# Patient Record
Sex: Female | Born: 1945 | Race: Black or African American | Hispanic: No | Smoking: Never smoker
Health system: Southern US, Community
[De-identification: ages and names within clinical notes are randomized; demographics above are authoritative.]

## PROBLEM LIST (undated history)

## (undated) ENCOUNTER — Emergency Department (HOSPITAL_COMMUNITY): Admission: EM | Payer: Medicare Other | Source: Home / Self Care

## (undated) DIAGNOSIS — K760 Fatty (change of) liver, not elsewhere classified: Secondary | ICD-10-CM

## (undated) DIAGNOSIS — N390 Urinary tract infection, site not specified: Secondary | ICD-10-CM

## (undated) DIAGNOSIS — D702 Other drug-induced agranulocytosis: Secondary | ICD-10-CM

## (undated) DIAGNOSIS — T451X5A Adverse effect of antineoplastic and immunosuppressive drugs, initial encounter: Secondary | ICD-10-CM

## (undated) DIAGNOSIS — D630 Anemia in neoplastic disease: Secondary | ICD-10-CM

## (undated) DIAGNOSIS — L989 Disorder of the skin and subcutaneous tissue, unspecified: Secondary | ICD-10-CM

## (undated) DIAGNOSIS — T783XXA Angioneurotic edema, initial encounter: Secondary | ICD-10-CM

## (undated) DIAGNOSIS — L439 Lichen planus, unspecified: Secondary | ICD-10-CM

## (undated) DIAGNOSIS — R509 Fever, unspecified: Secondary | ICD-10-CM

## (undated) DIAGNOSIS — M549 Dorsalgia, unspecified: Secondary | ICD-10-CM

## (undated) DIAGNOSIS — D61810 Antineoplastic chemotherapy induced pancytopenia: Secondary | ICD-10-CM

## (undated) DIAGNOSIS — R252 Cramp and spasm: Secondary | ICD-10-CM

## (undated) DIAGNOSIS — D72819 Decreased white blood cell count, unspecified: Secondary | ICD-10-CM

## (undated) DIAGNOSIS — C8290 Follicular lymphoma, unspecified, unspecified site: Principal | ICD-10-CM

## (undated) DIAGNOSIS — M25569 Pain in unspecified knee: Secondary | ICD-10-CM

## (undated) DIAGNOSIS — C44729 Squamous cell carcinoma of skin of left lower limb, including hip: Secondary | ICD-10-CM

## (undated) DIAGNOSIS — E079 Disorder of thyroid, unspecified: Secondary | ICD-10-CM

## (undated) DIAGNOSIS — R600 Localized edema: Secondary | ICD-10-CM

## (undated) DIAGNOSIS — R599 Enlarged lymph nodes, unspecified: Principal | ICD-10-CM

## (undated) DIAGNOSIS — C8239 Follicular lymphoma grade IIIa, extranodal and solid organ sites: Secondary | ICD-10-CM

## (undated) DIAGNOSIS — R55 Syncope and collapse: Secondary | ICD-10-CM

## (undated) DIAGNOSIS — IMO0002 Reserved for concepts with insufficient information to code with codable children: Secondary | ICD-10-CM

## (undated) DIAGNOSIS — G4700 Insomnia, unspecified: Secondary | ICD-10-CM

## (undated) DIAGNOSIS — K1379 Other lesions of oral mucosa: Secondary | ICD-10-CM

## (undated) DIAGNOSIS — M952 Other acquired deformity of head: Secondary | ICD-10-CM

## (undated) DIAGNOSIS — E663 Overweight: Secondary | ICD-10-CM

## (undated) DIAGNOSIS — K219 Gastro-esophageal reflux disease without esophagitis: Secondary | ICD-10-CM

## (undated) DIAGNOSIS — D376 Neoplasm of uncertain behavior of liver, gallbladder and bile ducts: Secondary | ICD-10-CM

## (undated) DIAGNOSIS — S6390XA Sprain of unspecified part of unspecified wrist and hand, initial encounter: Secondary | ICD-10-CM

## (undated) DIAGNOSIS — R160 Hepatomegaly, not elsewhere classified: Secondary | ICD-10-CM

## (undated) DIAGNOSIS — I10 Essential (primary) hypertension: Secondary | ICD-10-CM

## (undated) DIAGNOSIS — R110 Nausea: Secondary | ICD-10-CM

## (undated) DIAGNOSIS — K130 Diseases of lips: Principal | ICD-10-CM

## (undated) DIAGNOSIS — Z8572 Personal history of non-Hodgkin lymphomas: Secondary | ICD-10-CM

## (undated) DIAGNOSIS — B49 Unspecified mycosis: Secondary | ICD-10-CM

## (undated) DIAGNOSIS — R300 Dysuria: Secondary | ICD-10-CM

## (undated) DIAGNOSIS — L900 Lichen sclerosus et atrophicus: Secondary | ICD-10-CM

## (undated) DIAGNOSIS — G8929 Other chronic pain: Secondary | ICD-10-CM

## (undated) DIAGNOSIS — Q245 Malformation of coronary vessels: Secondary | ICD-10-CM

## (undated) DIAGNOSIS — E785 Hyperlipidemia, unspecified: Secondary | ICD-10-CM

## (undated) DIAGNOSIS — K649 Unspecified hemorrhoids: Principal | ICD-10-CM

## (undated) HISTORY — DX: Overweight: E66.3

## (undated) HISTORY — DX: Other lesions of oral mucosa: K13.79

## (undated) HISTORY — PX: TUBAL LIGATION: SHX77

## (undated) HISTORY — PX: BREAST EXCISIONAL BIOPSY: SUR124

## (undated) HISTORY — DX: Personal history of non-Hodgkin lymphomas: Z85.72

## (undated) HISTORY — DX: Disorder of thyroid, unspecified: E07.9

## (undated) HISTORY — DX: Follicular lymphoma, unspecified, unspecified site: C82.90

## (undated) HISTORY — DX: Diseases of lips: K13.0

## (undated) HISTORY — DX: Adverse effect of antineoplastic and immunosuppressive drugs, initial encounter: T45.1X5A

## (undated) HISTORY — PX: MR BREAST BILATERAL: HXRAD1

## (undated) HISTORY — PX: ABDOMINAL HYSTERECTOMY: SHX81

## (undated) HISTORY — DX: Gastro-esophageal reflux disease without esophagitis: K21.9

## (undated) HISTORY — DX: Urinary tract infection, site not specified: N39.0

## (undated) HISTORY — DX: Disorder of the skin and subcutaneous tissue, unspecified: L98.9

## (undated) HISTORY — DX: Dorsalgia, unspecified: M54.9

## (undated) HISTORY — PX: LYMPH NODE DISSECTION: SHX5087

## (undated) HISTORY — DX: Dysuria: R30.0

## (undated) HISTORY — DX: Cramp and spasm: R25.2

## (undated) HISTORY — DX: Hyperlipidemia, unspecified: E78.5

## (undated) HISTORY — DX: Lichen sclerosus et atrophicus: L90.0

## (undated) HISTORY — DX: Malformation of coronary vessels: Q24.5

## (undated) HISTORY — DX: Follicular lymphoma grade IIIa, extranodal and solid organ sites: C82.39

## (undated) HISTORY — DX: Unspecified hemorrhoids: K64.9

## (undated) HISTORY — DX: Decreased white blood cell count, unspecified: D72.819

## (undated) HISTORY — DX: Antineoplastic chemotherapy induced pancytopenia: D61.810

## (undated) HISTORY — DX: Fatty (change of) liver, not elsewhere classified: K76.0

## (undated) HISTORY — DX: Sprain of unspecified part of unspecified wrist and hand, initial encounter: S63.90XA

## (undated) HISTORY — DX: Pain in unspecified knee: M25.569

## (undated) HISTORY — DX: Localized edema: R60.0

## (undated) HISTORY — DX: Angioneurotic edema, initial encounter: T78.3XXA

## (undated) HISTORY — DX: Hepatomegaly, not elsewhere classified: R16.0

## (undated) HISTORY — DX: Reserved for concepts with insufficient information to code with codable children: IMO0002

## (undated) HISTORY — DX: Nausea: R11.0

## (undated) HISTORY — DX: Fever, unspecified: R50.9

## (undated) HISTORY — DX: Lichen planus, unspecified: L43.9

## (undated) HISTORY — DX: Insomnia, unspecified: G47.00

## (undated) HISTORY — DX: Other acquired deformity of head: M95.2

## (undated) HISTORY — DX: Other drug-induced agranulocytosis: D70.2

## (undated) HISTORY — DX: Enlarged lymph nodes, unspecified: R59.9

## (undated) HISTORY — DX: Other chronic pain: G89.29

## (undated) HISTORY — DX: Essential (primary) hypertension: I10

## (undated) HISTORY — DX: Neoplasm of uncertain behavior of liver, gallbladder and bile ducts: D37.6

## (undated) HISTORY — DX: Squamous cell carcinoma of skin of left lower limb, including hip: C44.729

## (undated) HISTORY — DX: Anemia in neoplastic disease: D63.0

## (undated) HISTORY — DX: Unspecified mycosis: B49

## (undated) HISTORY — DX: Syncope and collapse: R55

---

## 2001-06-03 ENCOUNTER — Encounter: Payer: Self-pay | Admitting: General Surgery

## 2001-06-03 ENCOUNTER — Ambulatory Visit (HOSPITAL_COMMUNITY): Admission: RE | Admit: 2001-06-03 | Discharge: 2001-06-03 | Payer: Self-pay | Admitting: General Surgery

## 2001-06-06 ENCOUNTER — Encounter: Payer: Self-pay | Admitting: General Surgery

## 2001-06-06 ENCOUNTER — Ambulatory Visit (HOSPITAL_COMMUNITY): Admission: RE | Admit: 2001-06-06 | Discharge: 2001-06-06 | Payer: Self-pay | Admitting: General Surgery

## 2001-08-30 ENCOUNTER — Encounter: Payer: Self-pay | Admitting: General Surgery

## 2001-08-30 ENCOUNTER — Ambulatory Visit (HOSPITAL_COMMUNITY): Admission: RE | Admit: 2001-08-30 | Discharge: 2001-08-30 | Payer: Self-pay | Admitting: General Surgery

## 2002-09-01 ENCOUNTER — Encounter: Payer: Self-pay | Admitting: General Surgery

## 2002-09-01 ENCOUNTER — Ambulatory Visit (HOSPITAL_COMMUNITY): Admission: RE | Admit: 2002-09-01 | Discharge: 2002-09-01 | Payer: Self-pay | Admitting: General Surgery

## 2003-09-02 ENCOUNTER — Ambulatory Visit (HOSPITAL_COMMUNITY): Admission: RE | Admit: 2003-09-02 | Discharge: 2003-09-02 | Payer: Self-pay | Admitting: Family Medicine

## 2003-09-30 ENCOUNTER — Ambulatory Visit (HOSPITAL_COMMUNITY): Admission: RE | Admit: 2003-09-30 | Discharge: 2003-09-30 | Payer: Self-pay | Admitting: General Surgery

## 2004-10-04 ENCOUNTER — Ambulatory Visit (HOSPITAL_COMMUNITY): Admission: RE | Admit: 2004-10-04 | Discharge: 2004-10-04 | Payer: Self-pay | Admitting: General Surgery

## 2005-03-20 ENCOUNTER — Ambulatory Visit: Payer: Self-pay | Admitting: Cardiology

## 2005-03-20 ENCOUNTER — Observation Stay (HOSPITAL_COMMUNITY): Admission: EM | Admit: 2005-03-20 | Discharge: 2005-03-21 | Payer: Self-pay | Admitting: Emergency Medicine

## 2005-03-28 ENCOUNTER — Inpatient Hospital Stay (HOSPITAL_COMMUNITY): Admission: AD | Admit: 2005-03-28 | Discharge: 2005-03-30 | Payer: Self-pay | Admitting: Cardiology

## 2005-03-28 ENCOUNTER — Ambulatory Visit: Payer: Self-pay | Admitting: *Deleted

## 2005-03-30 ENCOUNTER — Ambulatory Visit: Payer: Self-pay | Admitting: Internal Medicine

## 2005-04-06 ENCOUNTER — Ambulatory Visit: Payer: Self-pay | Admitting: Cardiology

## 2005-04-18 ENCOUNTER — Ambulatory Visit (HOSPITAL_COMMUNITY): Admission: RE | Admit: 2005-04-18 | Discharge: 2005-04-18 | Payer: Self-pay | Admitting: Family Medicine

## 2005-04-18 ENCOUNTER — Ambulatory Visit: Payer: Self-pay | Admitting: *Deleted

## 2005-05-26 ENCOUNTER — Encounter (HOSPITAL_COMMUNITY): Admission: RE | Admit: 2005-05-26 | Discharge: 2005-05-26 | Payer: Self-pay | Admitting: *Deleted

## 2005-05-26 ENCOUNTER — Ambulatory Visit: Payer: Self-pay | Admitting: Cardiology

## 2005-06-21 ENCOUNTER — Ambulatory Visit: Payer: Self-pay | Admitting: *Deleted

## 2005-07-04 ENCOUNTER — Ambulatory Visit: Payer: Self-pay | Admitting: Internal Medicine

## 2005-08-24 ENCOUNTER — Ambulatory Visit: Payer: Self-pay | Admitting: *Deleted

## 2005-09-07 ENCOUNTER — Ambulatory Visit: Payer: Self-pay | Admitting: *Deleted

## 2005-09-07 ENCOUNTER — Ambulatory Visit: Payer: Self-pay | Admitting: Internal Medicine

## 2005-10-19 ENCOUNTER — Ambulatory Visit (HOSPITAL_COMMUNITY): Admission: RE | Admit: 2005-10-19 | Discharge: 2005-10-19 | Payer: Self-pay | Admitting: General Surgery

## 2005-10-19 ENCOUNTER — Ambulatory Visit: Payer: Self-pay | Admitting: Orthopedic Surgery

## 2005-11-19 ENCOUNTER — Emergency Department (HOSPITAL_COMMUNITY): Admission: EM | Admit: 2005-11-19 | Discharge: 2005-11-19 | Payer: Self-pay | Admitting: Emergency Medicine

## 2005-12-19 ENCOUNTER — Ambulatory Visit: Payer: Self-pay | Admitting: *Deleted

## 2006-03-29 ENCOUNTER — Ambulatory Visit: Payer: Self-pay | Admitting: Internal Medicine

## 2006-04-15 ENCOUNTER — Emergency Department (HOSPITAL_COMMUNITY): Admission: EM | Admit: 2006-04-15 | Discharge: 2006-04-15 | Payer: Self-pay | Admitting: Emergency Medicine

## 2006-04-20 ENCOUNTER — Ambulatory Visit: Payer: Self-pay | Admitting: Internal Medicine

## 2006-04-24 ENCOUNTER — Ambulatory Visit (HOSPITAL_COMMUNITY): Admission: RE | Admit: 2006-04-24 | Discharge: 2006-04-24 | Payer: Self-pay | Admitting: Internal Medicine

## 2006-05-07 ENCOUNTER — Ambulatory Visit (HOSPITAL_COMMUNITY): Admission: RE | Admit: 2006-05-07 | Discharge: 2006-05-07 | Payer: Self-pay | Admitting: Internal Medicine

## 2006-05-22 ENCOUNTER — Ambulatory Visit (HOSPITAL_COMMUNITY): Admission: RE | Admit: 2006-05-22 | Discharge: 2006-05-22 | Payer: Self-pay | Admitting: Internal Medicine

## 2006-05-24 ENCOUNTER — Ambulatory Visit: Payer: Self-pay | Admitting: Internal Medicine

## 2006-07-13 ENCOUNTER — Ambulatory Visit: Payer: Self-pay | Admitting: Internal Medicine

## 2006-09-07 ENCOUNTER — Ambulatory Visit (HOSPITAL_COMMUNITY): Admission: RE | Admit: 2006-09-07 | Discharge: 2006-09-07 | Payer: Self-pay | Admitting: Internal Medicine

## 2006-10-22 ENCOUNTER — Ambulatory Visit (HOSPITAL_COMMUNITY): Admission: RE | Admit: 2006-10-22 | Discharge: 2006-10-22 | Payer: Self-pay | Admitting: Family Medicine

## 2006-10-23 ENCOUNTER — Ambulatory Visit: Payer: Self-pay | Admitting: *Deleted

## 2006-10-25 ENCOUNTER — Ambulatory Visit: Payer: Self-pay | Admitting: Internal Medicine

## 2006-11-01 ENCOUNTER — Encounter: Admission: RE | Admit: 2006-11-01 | Discharge: 2006-11-01 | Payer: Self-pay | Admitting: Family Medicine

## 2007-01-03 ENCOUNTER — Encounter: Admission: RE | Admit: 2007-01-03 | Discharge: 2007-01-03 | Payer: Self-pay | Admitting: Family Medicine

## 2007-02-19 ENCOUNTER — Ambulatory Visit (HOSPITAL_COMMUNITY): Admission: RE | Admit: 2007-02-19 | Discharge: 2007-02-19 | Payer: Self-pay | Admitting: Internal Medicine

## 2007-07-02 ENCOUNTER — Encounter: Admission: RE | Admit: 2007-07-02 | Discharge: 2007-07-02 | Payer: Self-pay | Admitting: Family Medicine

## 2007-08-23 ENCOUNTER — Ambulatory Visit (HOSPITAL_COMMUNITY): Admission: RE | Admit: 2007-08-23 | Discharge: 2007-08-23 | Payer: Self-pay | Admitting: Internal Medicine

## 2007-11-14 ENCOUNTER — Encounter: Admission: RE | Admit: 2007-11-14 | Discharge: 2007-11-14 | Payer: Self-pay | Admitting: Family Medicine

## 2008-01-06 ENCOUNTER — Ambulatory Visit: Payer: Self-pay | Admitting: Orthopedic Surgery

## 2008-01-06 DIAGNOSIS — S6390XA Sprain of unspecified part of unspecified wrist and hand, initial encounter: Secondary | ICD-10-CM | POA: Insufficient documentation

## 2008-01-09 ENCOUNTER — Encounter: Payer: Self-pay | Admitting: Orthopedic Surgery

## 2008-03-05 ENCOUNTER — Ambulatory Visit: Payer: Self-pay | Admitting: Cardiology

## 2008-03-17 ENCOUNTER — Encounter: Admission: RE | Admit: 2008-03-17 | Discharge: 2008-03-17 | Payer: Self-pay | Admitting: Internal Medicine

## 2008-03-30 ENCOUNTER — Ambulatory Visit: Payer: Self-pay | Admitting: Orthopedic Surgery

## 2008-03-30 DIAGNOSIS — IMO0002 Reserved for concepts with insufficient information to code with codable children: Secondary | ICD-10-CM

## 2008-03-30 DIAGNOSIS — M25569 Pain in unspecified knee: Secondary | ICD-10-CM | POA: Insufficient documentation

## 2008-09-07 ENCOUNTER — Encounter: Admission: RE | Admit: 2008-09-07 | Discharge: 2008-09-07 | Payer: Self-pay | Admitting: Internal Medicine

## 2008-11-15 DIAGNOSIS — I10 Essential (primary) hypertension: Secondary | ICD-10-CM

## 2008-11-15 DIAGNOSIS — E785 Hyperlipidemia, unspecified: Secondary | ICD-10-CM

## 2008-11-15 DIAGNOSIS — Q245 Malformation of coronary vessels: Secondary | ICD-10-CM | POA: Insufficient documentation

## 2008-11-16 ENCOUNTER — Encounter: Admission: RE | Admit: 2008-11-16 | Discharge: 2008-11-16 | Payer: Self-pay | Admitting: Family Medicine

## 2008-11-16 ENCOUNTER — Ambulatory Visit: Payer: Self-pay | Admitting: Cardiology

## 2008-11-16 ENCOUNTER — Encounter: Payer: Self-pay | Admitting: Cardiology

## 2008-11-16 DIAGNOSIS — R252 Cramp and spasm: Secondary | ICD-10-CM

## 2009-09-17 ENCOUNTER — Encounter (INDEPENDENT_AMBULATORY_CARE_PROVIDER_SITE_OTHER): Payer: Self-pay | Admitting: *Deleted

## 2009-12-07 ENCOUNTER — Ambulatory Visit (HOSPITAL_COMMUNITY): Admission: RE | Admit: 2009-12-07 | Discharge: 2009-12-07 | Payer: Self-pay | Admitting: Family Medicine

## 2009-12-27 ENCOUNTER — Ambulatory Visit: Payer: Self-pay | Admitting: Cardiology

## 2009-12-27 DIAGNOSIS — E663 Overweight: Secondary | ICD-10-CM

## 2009-12-27 HISTORY — DX: Overweight: E66.3

## 2010-01-21 ENCOUNTER — Ambulatory Visit: Payer: Self-pay | Admitting: Cardiology

## 2010-05-02 ENCOUNTER — Encounter: Admission: RE | Admit: 2010-05-02 | Discharge: 2010-05-02 | Payer: Self-pay | Admitting: Internal Medicine

## 2010-10-02 ENCOUNTER — Encounter: Payer: Self-pay | Admitting: *Deleted

## 2010-10-02 ENCOUNTER — Encounter (INDEPENDENT_AMBULATORY_CARE_PROVIDER_SITE_OTHER): Payer: Self-pay | Admitting: Internal Medicine

## 2010-10-11 NOTE — Letter (Signed)
Summary: Appointment - Reminder 2  [facility], Main Office  [address]   [place], [place] [address]   Phone: [phone]  Fax: [phone]     September 17, 2009 MRN: [id number]   [name] [address] [place], [place]  [address]   Dear [name],  Our records indicate that it is time to schedule a follow-up appointment.Dr.Hochrein recommended that you follow up with [place] in March,2011. It is very important that we reach you to schedule this appointment. We look forward to participating in your health care needs. Please contact us at the number listed above at your earliest convenience to schedule your appointment.  If you are unable to make an appointment at this time, give [place] a call so we can update our records.     Sincerely,   Glass blower/designer

## 2010-10-11 NOTE — Assessment & Plan Note (Signed)
Summary: f1y/jss   Visit Type:  Follow-up Primary Provider:  Mirna Mires, MD  CC:  Anomalous Coronary Artery.  History of Present Illness: The patient presents for followup of the above. Since I last saw her she has had no new cardiovascular complaints. She denies any chest pressure other than her usual reflux. She has no shortness of breath, PND or orthopnea. She has no palpitations, presyncope or syncope. She has no neck or arm discomfort. She does do some water aerobics and other light exercising.  Current Medications (verified): 1)  Potassium Chloride 20 Meq Pack (Potassium Chloride) .... On By Mouth Dialy 2)  Fish Oil   Oil (Fish Oil) .... 2 By Mouth Daily 3)  Aspirin 81 Mg  Tabs (Aspirin) .Marland Kitchen.. 1 By Mouth Daily 4)  Ibuprofen 800 Mg  Tabs (Ibuprofen) .... Prn 5)  Avalide 150-12.5 Mg Tabs (Irbesartan-Hydrochlorothiazide) .... One By Mouth Daily 6)  Kapidex 60 Mg Cpdr (Dexlansoprazole) .... Onw By Mouth Daily 7)  Red Yeast Rice 600 Mg Caps (Red Yeast Rice Extract) .Marland Kitchen.. 1 By Mouth Daily 8)  Lecithin 1200 Mg Caps (Lecithin) .... 2 By Mouth Daily 9)  Calcium-Magnesium-Zinc 333-133-8.3 Mg Tabs (Calcium-Magnesium-Zinc) .Marland Kitchen.. 1 By Mouth Daily 10)  Align  Caps (Probiotic Product) .Marland Kitchen.. 1 By Mouth Daily 11)  Prevacid 30 Mg Cpdr (Lansoprazole) .Marland Kitchen.. 1 P Odaily 12)  Vitamin B-12 100 Mcg Tabs (Cyanocobalamin) .Marland Kitchen.. 1 By Mouth Daily 13)  Vitamin B-6 100 Mg Tabs (Pyridoxine Hcl) .Marland Kitchen.. 1 By Mouth Daily 14)  Vitamin B Complex-C   Caps (B Complex-C) .Marland Kitchen.. 1 Po Daily 15)  Multivitamins   Tabs (Multiple Vitamin) .Marland Kitchen.. 1 By Mouth Daily  Allergies (verified): 1)  ! Lasix 2)  ! 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  A CT eventually confirmed that this was traveling between the pulmonary artery and aorta.  There were no apparent atherosclerotic lesions.  The EF was 75%. She had a stress perfusion study in followup that demonstrated  no evidence of ischemia.) GERD HTN Hyperlipidemia  Past Surgical History: Reviewed history from 11/15/2008 and no changes required. Cyst Removed Bilateral Breasts Hysterectomy Lymph Node Resected  Review of Systems       Right leg numbness. Otherwise as stated in the history of present illness is negative for all other systems.  Vital Signs:  Patient profile:   65 year old female Height:      62 inches Weight:      189 pounds BMI:     34.69 Pulse rate:   65 / minute Resp:     18 per minute BP sitting:   130 / 86  (right arm)  Vitals Entered By: Marrion Coy, CNA (December 27, 2009 8:38 AM)  Physical Exam  General:  Well developed, well nourished, in no acute distress. Head:  normocephalic and atraumatic Eyes:  PERRLA/EOM intact; conjunctiva and lids normal. Mouth:  Teeth, gums and palate normal. Oral mucosa normal. Chest Wall:  no deformities  Abdomen:  Bowel sounds positive; abdomen soft and non-tender without masses, organomegaly, or hernias noted. No hepatosplenomegaly. Msk:  Back normal, normal gait. Muscle strength and tone normal. Extremities:  No clubbing or cyanosis. Neurologic:  Alert and oriented x 3. Skin:  Intact without lesions or rashes. Psych:  Normal affect.   Detailed Cardiovascular Exam  Neck    Carotids: Carotids full and equal bilaterally without bruits.      Neck Veins: Normal, no  JVD.    Heart    Inspection: no deformities or lifts noted.      Palpation: normal PMI with no thrills palpable.      Auscultation: regular rate and rhythm, S1, S2 without murmurs, rubs, gallops, or clicks.    Vascular    Abdominal Aorta: no palpable masses, pulsations, or audible bruits.      Femoral Pulses: normal femoral pulses bilaterally.      Pedal Pulses: normal pedal pulses bilaterally.      Radial Pulses: normal radial pulses bilaterally.      Peripheral Circulation: no clubbing, cyanosis, or edema noted with normal capillary refill.      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 As it has been some years since his stress test and I would like her to exercise more I will perform an exercise treadmill test. This will allow me to rule out ischemia, risk stratify and most importantly give her a prescription for exercise. Orders: Treadmill (Treadmill) EKG w/ Interpretation (93000)  Problem # 2:  OVERWEIGHT (ICD-278.02) I discussed this with the patient and we began to discuss strategies for improved health to include weight loss. I will discuss this further at the time of her treadmill.  Problem # 3:  ESSENTIAL HYPERTENSION, BENIGN (ICD-401.1) Her blood pressure is controlled. She will continue the meds as listed. Orders: Treadmill (Treadmill)  Problem # 4:  HYPERLIPIDEMIA (ICD-272.4) She cannot tolerate prescription meds and will continue with red  Patient Instructions: 1)  Your physician recommends that you schedule a follow-up appointment at time of GXT 2)  Your physician recommends that you continue on your current medications as directed. Please refer to the Current Medication list given to you today. 3)  Your physician has requested that you have an exercise tolerance test.  For further information please visit [email or web address]  Please also follow instruction sheet, as given.

## 2011-01-24 ENCOUNTER — Ambulatory Visit (INDEPENDENT_AMBULATORY_CARE_PROVIDER_SITE_OTHER): Payer: Medicare Other | Admitting: Orthopedic Surgery

## 2011-01-24 ENCOUNTER — Encounter: Payer: Self-pay | Admitting: Orthopedic Surgery

## 2011-01-24 DIAGNOSIS — M25569 Pain in unspecified knee: Secondary | ICD-10-CM

## 2011-01-24 DIAGNOSIS — M25469 Effusion, unspecified knee: Secondary | ICD-10-CM

## 2011-01-24 NOTE — Progress Notes (Signed)
65 years old, complains of diffuse RIGHT knee pain, which came on gradually starting in April of this year. Complains of 5/10 pain, which is constant, worse with walking or bending. Her knee better with rest and staying off of her feet. She does not have any locking or catching. Does complain of some swelling and the pain is not relieved by ibuprofen  Review of systems heartburn joint pain and swelling, stiffness, with muscle pain. He see cruising seasonal allergies. Denies unexpected weight loss, chest pain, shortness of breath, blurred vision, frequency, numbness, nervousness, excessive thirst, or skin problems.  Social history she has retired. Does not smoke or drink.   General: The patient is normally developed, with normal grooming and hygiene. There are no gross deformities. The body habitus is normal   CDV: The pulse and perfusion of the extremities are normal   LYMPH: There is no gross lymphadenopathy in the extremities   Skin: There are no rashes, ulcers or cafe-au-lait spot   Psyche: The patient is alert, awake and oriented.  Mood is normal   Neuro:  The coordination and balance are normal.  Sensation is normal. Reflexes are 2+ and equal   Musculoskeletal  RIGHT knee  Effusion  Medial joint line tenderness  Range of motion.  Normal.  Strength is normal.  Ligament stable. LEFT knee  Range of motion normal inspection normal  Impression joint effusion probable arthritis x-rays look fairly normal  Aspirate injection Name  Followup if any problem

## 2011-01-24 NOTE — Patient Instructions (Signed)
The patient is advised to apply ice or cold packs intermittently as needed to relieve pain.   You have received a steroid shot. 15% of patients experience increased pain at the injection site with in the next 24 hours. This is best treated with ice and tylenol extra strength 2 tabs every 8 hours. If you are still having pain please call the office.    

## 2011-01-24 NOTE — Assessment & Plan Note (Signed)
[facility]                            CARDIOLOGY OFFICE NOTE   Joyce, Kennedy                        MRN:          [id number]  DATE:03/05/2008                            DOB:          Nov 03, 1945    PRIMARY CARE PHYSICIAN:  Annia Friendly. Hill, MD   REASON FOR PRESENTATION:  Evaluate the patient with anomalous coronary  artery and lower extremity edema.   HISTORY OF PRESENT ILLNESS:  The patient is a pleasant 65 year old  African American female.  She was evaluated for chest pain in 2006.  At  that time, she had a CT that was negative for pulmonary embolism.  She  subsequently had a cardiac catheterization which demonstrated an  anomalous left main of the right coronary cusp.  A CT eventually  confirmed that this was traveling between the pulmonary artery and  aorta.  There were no apparent atherosclerotic lesions.  The EF was 75%.  She had a stress perfusion study in followup that demonstrated no  evidence of ischemia.  She did have some mitral regurgitation suggested  by catheterization.  However, echocardiogram demonstrated no significant  mitral regurgitation.  She had a normal left ventricular function.   The patient has done well since that time.  She presents for followup.  She rarely gets chest discomfort.  She can ascribe it to foods.  It has  been in the same pattern since 2006 when she was hospitalized.  She is  able to do activities such as her yard work and carrying buckets of  water for her plants.  With this, she does not get any chest pressure,  neck, or arm discomfort.  She has not had any palpitation, presyncope,  or syncope.  She has no PND or orthopnea.  She has had no heart testing  since 2000, and she has had no cardiovascular testing since 2006.   She has noticed some mild lower extremity swelling.  This has been  intermittent.  She has occasionally taken a diuretic for this, but this  makes her feel fatigued.   PAST  MEDICAL HISTORY:  1. Anomalous left main as described.  2. Hypertension.  3. Dyslipidemia.   ALLERGIES/INTOLERANCES:  None.   MEDICATIONS:  1. Nexium 40 mg daily.  2. Potassium 20 mEq daily.  3. Aspirin 81 mg daily.  4. Cod liver oil.  5. Hyoscyamine.  6. Avalide 150/12.5 daily.  7. Vitamin B6.  8. B complex.  9. Coenzyme Q.  10.Fish oil.   REVIEW OF SYSTEMS:  As stated in the HPI and otherwise negative for  other systems.   PHYSICAL EXAMINATION:  The patient is in no distress.  Blood pressure 118/79, heart rate 69 and regular, weight 184 pounds, and  body mass index 33.  HEENT:  Eyelids are unremarkable.  Pupils are equal, round, and reactive  to light.  Fundi not visualized.  Oral mucosa is unremarkable.  NECK:  No jugular venous distention at 45 degrees.  Carotid upstroke  brisk and symmetric.  No bruits.  No thyromegaly.  LYMPHATICS:  No cervical, axillary, or inguinal adenopathy.  LUNGS:  Clear to auscultation bilaterally.  BACK:  No costovertebral angle tenderness.  CHEST:  Unremarkable.  HEART:  PMI not displaced or sustained.  S1 and S2 within normal limits.  No S3.  No S4.  No clicks.  No rubs.  No murmurs.  ABDOMEN:  Obese.  Positive bowel sounds, normal in frequency and pitch.  No bruits.  No rebound.  No guarding.  No midline pulsatile mass.  No  hepatomegaly.  No splenomegaly.  SKIN:  No rashes.  No nodules.  EXTREMITIES:  Pulses are 2+ throughout.  Trace bilateral lower extremity  edema.  No cyanosis.  No clubbing.  NEUROLOGIC:  Oriented to person, place, and time.  Cranial nerves II-XII  grossly intact.  Motor grossly intact.   EKG:  Sinus rhythm, rate 71, axis within normals, intervals within  normal limits, and poor anterior R wave progression.  No acute ST-T wave  changes.   ASSESSMENT AND PLAN:  1. Anomalous coronary arteries.  The patient has had no symptoms      related to this.  She had negative stress perfusion study.  This      has been  managed medically and expectantly.  She will let me know      if she ever has any symptoms such as chest pain, dyspnea, or other      that could even remotely be cardiac at which time I would      reevaluate.  2. Edema.  We had a long discussion about this.  This is mild.  She      does use sea salt.  She has been on her feet a lot.  I instructed      her to keep her feet elevated and to stop using salt as much as      possible.  She will let me know if it is symptomatic enough that      she might want to take another diuretic.  3. Hypertension.  Blood pressure is well controlled.  She will      continue the medications as listed.  4. Dyslipidemia, per Dr. Loleta Chance.  5. Followup.  I will see her back in 1 year or sooner if needed.     Rollene Rotunda, MD, [facility]  Electronically Signed    JH/MedQ  DD: 03/05/2008  DT: 03/06/2008  Job #: [id number]   cc:   Annia Friendly. Loleta Chance, MD

## 2011-01-25 MED ORDER — METHYLPREDNISOLONE ACETATE 40 MG/ML IJ SUSP: 40.0000 mg | Freq: Once | INTRAMUSCULAR | Status: DC

## 2011-01-25 NOTE — Progress Notes (Signed)
Aspirate RIGHT knee  Aspirate 15 cc clear yellow fluid RIGHT knee  Procedure  Verbal consent  Time out taken  Lateral approach aspirated 15 cc of clear yellow fluid is sterile technique using alcohol and ethyl chloride prep  Injected steroid afterwards Depo-Medrol 40 mg and lidocaine 1% 3 cc tolerated well without complication

## 2011-01-25 NOTE — Progress Notes (Signed)
A separate x-ray report AP lateral sunrise view RIGHT knee  Minimal varus minimal medial joint space narrowing  Impression mild arthritis

## 2011-01-27 NOTE — H&P (Signed)
[name], [name]                 ACCOUNT NO.:  [id number]   MEDICAL RECORD NO.:  [id number]          PATIENT TYPE:  INP   LOCATION:  2004                         FACILITY:  [facility]   PHYSICIAN:  Cecil Cranker, M.D.DATE OF BIRTH:  12-28-1945   DATE OF ADMISSION:  03/28/2005  DATE OF DISCHARGE:                                HISTORY & PHYSICAL   PRIMARY CARE PHYSICIAN:  Dr. Elpidio Anis   HISTORY OF PRESENT ILLNESS:  [name] is seen in our office today for  evaluation of chest discomfort.  [name] was recently admitted to [facility] from March 20, 2005 to March 21, 2005.  At that time she had  presented with a one and a half day history of constant chest discomfort.  She had noted onset of pain while shopping.  She described left-sided aching  pain that was mild and not severe in nature.  She was admitted to the  hospital and ruled out with serial cardiac enzymes for acute myocardial  infarction.  She also ruled out for a pulmonary embolus with a CT scan of  her chest.  She was noted to have a systolic murmur on admission and had an  echocardiogram that revealed some aortic sclerosis with no stenosis.  Her  echocardiogram also revealed normal LV function.  Her symptoms resolved  during hospitalization.  She was felt to be stable for discharge with an  outpatient cardiology evaluation for possible stress test.   [name] states that since discharge from the hospital she has done well  with no recurrence of chest discomfort.  However, she does have some  generalized weakness and fatigue and basically no energy to do any exercise  as she was doing prior to this episode.  [name] states that for a month  prior to this episode she was walking on a daily basis with no symptoms of  chest discomfort or shortness of breath.  She has not had any recurrent  chest pain since discharge from the hospital.   PAST MEDICAL HISTORY:  1.  Hypertension.  2.  Hyperlipidemia.  3.   Gastroesophageal reflux disorder.  4.  Status post hysterectomy.  5.  Status post cyst removal in her breast.  6.  Status post lymph node biopsy in the neck region.  7.  Status post bilateral tubal ligation.   She lives in [place] with her husband.  She works in [employer] at  [employer].  She does not smoke, use alcohol or illicit drugs.   FAMILY HISTORY:  Father is deceased of pancreatic cancer at age 66.  Mother  is alive with a history of breast cancer, stroke, and hypertension.  No  coronary artery disease.  She has three brothers, one with a history of  brain tumors and kidney cancer and hypertension.  Two other brothers have  hypertension with no coronary disease.  She has two sisters, one which has a  heart problem that she is unclear of.   REVIEW OF SYSTEMS:  HEENT:  Occasional dizziness.  Positive for reading  glasses.  No headaches  or syncope.  No hearing loss.  No dentures.  CARDIORESPIRATORY:  Please see HPI.  GASTROINTESTINAL:  No change in  appetite.  No nausea or vomiting.  No diarrhea.  Positive some constipation.  No history of ulcers or colitis.  GENITOURINARY:  No frequency.  Occasional  stress incontinence.  MUSCULOSKELETAL:  Occasional arthralgias in her hands  and knees.  Occasional peripheral edema.   CURRENT MEDICATIONS:  1.  Hydrochlorothiazide 25 mg daily.  2.  Nexium 40 mg daily.  3.  Estraderm patch 0.05 mg weekly.  4.  Potassium 20 mEq daily.  5.  Avapro 75 mg daily.  6.  Aspirin 81 mg daily.  7.  Cod liver oil daily.  8.  Co-enzyme Q10 daily.  9.  Caltrate 600 mg daily.  10. Multivitamin daily.   ALLERGIES:  No known drug allergies.   PHYSICAL EXAMINATION:  VITAL SIGNS:  Weight 190 pounds, blood pressure  102/78, pulse 72 and regular.  HEENT:  Normocephalic, atraumatic.  Pupils are equal, round, and reactive to  light.  Extraocular movements intact.  NECK:  No jugular venous distention.  No carotid bruits.  CARDIOVASCULAR:  She has a regular  rate and rhythm.  Soft systolic ejection  murmur is noted.  LUNGS:  Clear to auscultation bilaterally.  ABDOMEN:  Soft and nontender with active bowel sounds.  No  hepatosplenomegaly is noted.  EXTREMITIES:  Distal pulses are intact with no significant peripheral edema.   Electrocardiogram reveals a sinus rhythm at 77 beats per minute with  inverted T-waves in leads V1-V4, also in lead 3.  Normal PR interval, QRS  duration, and QTC.  This is changed from her previous EKG done in the  hospital on March 20, 2005 at which time she only had inverted T-waves in  lead V1.   IMPRESSION AND PLAN:  1.  Chest pain in this patient with no known coronary artery disease with      cardiac risk factors including hypertension, hyperlipidemia.  [name]      has atypical chest discomfort; however, she does have significant      changes on her electrocardiogram from her previous cardiogram on March 20, 2005.  Considering these changes we would like to further evaluate      her with a cardiac catheterization.  We will admit her to the hospital      tonight and obtain some basic laboratory work and arrange for her to      have a cardiac catheterization tomorrow morning.  Will go ahead and      continue her on her aspirin and other medications as they are listed      above.  2.  Hypertension.  Is currently well controlled on her medications as above.      Her Ziac that she was on prior to her last hospitalization was      discontinued secondary to bradycardia and HCTZ was used in place of      this.  Her blood pressure seems to be well controlled on this regimen.  3.  Dyslipidemia.  Patient did not have a history of this prior to her last      admission to the hospital at which time a fasting lipid profile revealed      a total cholesterol of 225, triglycerides 237, HDL 33, and an LDL of      145.  Considering this we will go ahead and start her on Lipitor 20  mg     daily while she is in the hospital  and she will      be given a prescription for this at discharge.  Will need to obtain      fasting lipids and LFTs again in six to eight weeks for reevaluation of      this.   Patient was interviewed and examined by Dr. Glennon Hamilton.  He agrees with the  above assessment and plan.      Amy B   AB/MEDQ  D:  03/28/2005  T:  03/28/2005  Job:  [id number]   cc:   Dirk Dress. Katrinka Blazing, M.D.  [address]  [place]  [place] [address]  Fax: [phone]

## 2011-01-27 NOTE — Cardiovascular Report (Signed)
NAMEVIVION, [name] NO.:  [id number]   MEDICAL RECORD NO.:  [id number]          PATIENT TYPE:  INP   LOCATION:  2004                         FACILITY:  [facility]   PHYSICIAN:  Arvilla Meres, M.D. [facility] OF BIRTH:  Sep 22, 1945   DATE OF PROCEDURE:  03/29/2005  DATE OF DISCHARGE:                              CARDIAC CATHETERIZATION   PRIMARY CARE PHYSICIAN:  Dr. Elpidio Anis in [place].   CARDIOLOGIST:  Dr. Graceann Congress.   PATIENT IDENTIFICATION:  [name] is a very pleasant 65 year old with  history of hypertension, hyperlipidemia, but no known coronary disease. She  was recently admitted to [facility] last week for atypical chest  pain which resolved. She had a CT scan of the chest at that time which ruled  out a pulmonary embolus. She had echocardiogram that showed a normal LV  function. She was subsequently seen in follow-up and she was not having any  further chest pain; however, she did remain fatigue with no energy which she  attributed to her change in antihypertensive medication. However, it was  noted that she did have significant EKG changes since her admission with T-  wave inversion in V1-V3 concerning for possible ischemia. She is referred  for diagnostic cardiac catheterization.   PROCEDURES PERFORMED:  1.  Selective coronary angiography.  2.  Left heart catheterization.  3.  Left ventriculogram.   DESCRIPTION OF PROCEDURE:  The risks and benefits of the catheterization  were explained to [name]. Consent was signed and placed on the chart. 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  Multiple catheters were attempted and finally the left main was intubated  with an AL-1 catheter. Given the location of the left main takeoff, the  right coronary artery was filled briskly with reflux back from the left main  injections.  Thus, all three coronaries were viewed using the AL-1. An  attempt was made to intubate the RCA selectively with a JR-4; however, the  patient developed transient left bundle branch block with manipulation of  the JR-4 near the aortic valve. Thus as the right coronary artery was imaged  adequately nonselectively, we did not feel further imaging was needed.  Angled pigtail was used for left heart catheterization and left  ventriculogram. At the end of the procedure, the patient was in the holding  area for removal of her sheath.   COMPLICATIONS:  Development of transient left bundle branch block on  manipulation of catheter across the aortic valve. This resolved after a few  minutes.   PRESSURES:  Central aortic pressure was 113/72 with a mean of 90. LV  pressure was 124/3/10. There was no gradient on aortic valve pullback.   FINDINGS:  Left Main: As above. There was an anomalous takeoff of the left  main arising either from the right coronary cusp or just a very high  anterior takeoff. There was a very long left main with some mild  irregularities in it but no  significant stenoses. Prior to the takeoff of  the major coronary arteries, there was large branching septal perforator.   LAD was a long vessel coursing to the apex. It gave off a large diagonal.  There was minor irregularity in the proximal LAD prior to the takeoff of the  first diagonal. There is no flow-limiting stenosis.   Left circumflex: A large vessel that gave off a large OM1, medium-sized OM2,  and a small OM3. There was no angiographic CAD.   Right coronary artery was a large dominant vessel with a large PDA and small  normal-size PL. There was no angiographic CAD.   Left ventriculogram shot in the RAO approach showed an ejection fraction of  approximately 75% with 2 to 3+ MR which was likely secondary to significant  ventricular ectopy.   ASSESSMENT:  1.  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  2.  No significant coronary artery disease.  3.  Normal left ventricular function.   PLAN:  Will be for risk factor modification and she will likely be able to  be discharged home in the morning after her bedrest.       DB/MEDQ  D:  03/29/2005  T:  03/29/2005  Job:  [id number]   cc:   Dirk Dress. Katrinka Blazing, M.D.  [address]  [place]  [place] [address]  Fax: [phone]   Cecil Cranker, M.D.

## 2011-01-27 NOTE — Letter (Signed)
October 23, 2006    Dirk Dress. Katrinka Blazing, M.D.  [address]  [place],  [place] [address]   RE:  [name], [name]  MRN:  [id number]  /  DOB:  03/02/1946   Dear Jerolyn Shin:   It was a pleasure to see your nice patient, [name], for followup on  October 23, 2006. 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 She had no atherosclerosis  at time of catheterization. She had 2 to 3+ mitral regurgitation, but  this was felt to be related to ventricular ectopy as the echocardiogram  reveals only trivial mitral regurgitation. The patient has had no recent  chest pains. Is feeling quite well and enjoying retirement. She has had  hyperlipidemia, but Zocor was discontinued as you know because of liver  cysts.   She also has a history of hypertension.   Present regimen includes Nexium 40, Estraderm, potassium 20, aspirin 81,  Avalide 150/12.5.   Blood pressure is 142/89, pulse 69 in normal sinus rhythm.  GENERAL APPEARANCE: Is normal.  JVP is not elevated.  LUNGS:  Are clear.  CARDIAC: Examination is unremarkable except for 1/6 short systolic  ejection murmur aortic area.  ABDOMEN: Is unremarkable.  EXTREMITIES: Normal.   Impression:  1. 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  2. Hypertension, fairly control.  3. Hyperlipidemia.   I should note the EKG is normal.  in  The patient should follow up with you and I suggest she be seen by in  our [place] office  in 6 months by Dr. Dietrich Pates. Thank you for the  opportunity of sharing in this nice patient's care.    Sincerely,      E. Graceann Congress, MD, [facility]  Electronically Signed    EJL/MedQ  DD: 10/23/2006  DT: 10/23/2006  Job #: [id number]

## 2011-01-27 NOTE — H&P (Signed)
NAMEJUSTIN, [name]                 ACCOUNT NO.:  [id number]   MEDICAL RECORD NO.:  [id number]          PATIENT TYPE:  INP   LOCATION:  A206                          FACILITY:  [facility]   PHYSICIAN:  Osvaldo Shipper, MD     DATE OF BIRTH:  09/16/1945   DATE OF ADMISSION:  03/20/2005  DATE OF DISCHARGE:  LH                                HISTORY & PHYSICAL   ADMISSION DIAGNOSIS:  Chest pain.  Rule out acute coronary syndrome.   CHIEF COMPLAINT:  Chest pain since yesterday.   The patient's primary doctor is Dr. Elpidio Anis.   HISTORY OF PRESENT ILLNESS:  The patient is a 65 year old African-American  female with past medical history of hypertension, obesity, and GERD who  presented with chest pain which started yesterday at about 3:30 p.m. when  she was shopping.  The patient stated that the pain was described as a dull  pain, 4/10 in intensity, retrosternal area but more to the right side.  The  pain did not radiate anywhere.  The pain was associated with some  diaphoresis and some light-headedness.  Otherwise, not associated with  nausea or vomiting.  The patient states that she has been feeling short of  breath for the past one week, and she did feel more so in the past couple of  days, and this has been with exertion.  The patient mentioned that yesterday  the pain started about 3:30 p.m. and lasted all through the night.  When she  woke up this morning, she was still having the pain and she decided to come  to the ER.  The patient has experienced similar pain in the past, however,  they do not last as long, and they go away spontaneously.  This morning, she  took regular aspirin and then came into the ED.  Currently, she describes  the pain as 0/10.  The patient states she was not given any nitroglycerin  medication in the ED.  She says that she took Tums this morning which also  seemed to help the pain.  The patient gives a history of road trip about a  month and a half ago when she  drove to [place] with her husband.  No  history of any clot formation or any diagnosis suggestive of PE in the  family.  The patient denies any GERD symptoms at this time.  Currently, she  is very comfortable.   MEDICATIONS:  The patient does not know the doses of any of her medications.  Nexium, Avapro, potassium chloride, Ziac, baby aspirin.  She uses an  estrogen patch on a daily basis.   ALLERGIES:  No known drug allergies.   PAST MEDICAL HISTORY:  Hypertension, GERD.  No history of coronary artery  disease, COPD, or strokes in the past.   PAST SURGICAL HISTORY:  Hysterectomy 20 years ago, breast cyst removal  twice, the last one being back in 1990, history of some lymph node biopsy in  the neck region five to six years ago which was benign.  She has had tubal  ligation also  done in the past.   SOCIAL HISTORY:  The patient lives with her husband in [place].  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  She does not smoke.  No alcohol  consumption.  No illicit drug use.  She does not really watch her diet.  She  walks for exercise.   FAMILY HISTORY:  Father died of pancreatic cancer.  He also had heart  conditions and asthma, however, he has not had any MRIs as per the patient.  Mother has a history of breast cancer, stroke, and hypertension.  She is on  a blood thinner.  She does not have a history of coronary artery disease.   She has three brothers, one of whom has history of brain tumors and kidney  cancer and hypertension.  The other brothers have hypertension with no  history of coronary artery disease.  She has two other sisters, one of whom  has heart problems, unknown type.   REVIEW OF SYSTEMS:  A 10-point review of systems was negative except for  weight loss of a few pounds in the past few months as the patient has been  trying to lose weight.  She also mentions a history of leg swelling, right  ankle more than the left, which is on and off during the  course of the day.   PHYSICAL EXAMINATION:  Vital signs:  Temperature is 97.4, blood pressure is  131/74, heart rate of about 50, respiratory rate 16, pulse oximetry 100%.  General:  This is an obese female in no apparent distress, very pleasant to  talk to.  HEENT:  There is no pallor and no icterus.  Oral mucosa is moist.  No oral  lesions are seen.  Respiratory:  Lungs are clear to auscultation bilaterally.  Cardiovascular:  S1, S2 is normal.  There is a systolic murmur appreciated  at the aortic area, not radiating to the carotid.  No JVD is seen.  No S3,  S4 is heard.  Abdomen:  Abdomen is obese, nontender, and nondistended.  Bowel sounds  present.  No mass or organomegaly appreciated.  Chest wall:  Nontender to palpation.  No reproducibility of the pain.  Extremities without edema.  Peripheral pulses are palpable.  Neurologic:  The patient is alert and oriented x3 with no other gross  neurologic deficits elicited.   LABORATORY DATA:  CBC shows white count of 3.1, normal differential.  Hemoglobin is 13.8, platelet count 207.  D-dimer of 2.30.   Sodium 139, potassium 3.8, chloride 104, bicarbonate 30, glucose 98, BUN 11,  creatinine 1.1.  Calcium 9.2.  CK total 138, troponin has been negative x2 but only over a period of about  10 minutes.   The patient's EKG shows sinus bradycardia with normal axis, rate is about  47.  There are some nonspecific T-wave changes.  Otherwise, no Q-wave is  seen.  No LVH is seen.   Because of the elevated D-dimer, CAT scan of the chest was done to rule out  PE which was ruled out based on preliminary report.  Lungs were clear.   IMPRESSION:  This 65 year old obese African-American female with history of  hypertension and GERD presents with a one-day history of chest pain.  The  chest pain is atypical for coronary artery disease.  Differential for her  condition includes acid reflux, chest wall pain.  Of course, cardiac etiology needs to be  ruled out in this patient.  Because the patient also  has a  murmur in the aortic area which could suggest aortic stenosis.  The  patient is profoundly bradycardic, probably secondary to her medications.   PLAN:  1.  Chest pain.  Pulmonary embolus has been ruled out.  We will admit the      patient and rule her out for acute coronary syndrome, doing serial      cardiac enzymes.  We will repeat EKG at this time.  We will also obtain      a 2D echocardiogram to evaluate the systolic murmur, to rule out aortic      stenosis.  We will check a lipid profile in the morning.  We will      continue the patient on aspirin.  We will initiate low-dose beta blocker      with holding parameters at this time.  Depending on the results, further      medical decisions will be made.  If the patient rules out for acute      coronary syndrome and if she is pain free, we will consider an      outpatient stress test.   1.  Hypertension.  We will continue on her medications.  The patient does      not know her dosage.  We will start the patient on the lowest dosage of      all of her medications, and once she gets her list, we will change it as      needed.   Further management decisions will be based on the above testing and if the  patient responds to treatment.       GK/MEDQ  D:  03/20/2005  T:  03/20/2005  Job:  [id number]   cc:   Dirk Dress. Katrinka Blazing, M.D.  [address]  [place]  [place] [address]  Fax: [phone]

## 2011-01-27 NOTE — Discharge Summary (Signed)
NAMEEMMELIA, HOLDSWORTH                 ACCOUNT NO.:  [id number]   MEDICAL RECORD NO.:  [id number]          PATIENT TYPE:  INP   LOCATION:  2004                         FACILITY:  [facility]   PHYSICIAN:  Arvilla Meres, M.D. LHCDATE OF BIRTH:  1946/06/12   DATE OF ADMISSION:  03/28/2005  DATE OF DISCHARGE:  03/30/2005                                 DISCHARGE SUMMARY   PRIMARY CARE PHYSICIAN:  Dr. Elpidio Anis.   CARDIOLOGIST:  Dr. Glennon Hamilton.   DISCHARGING DIAGNOSES:  1.  Chest pain with EKG changes.  Status post cardiac catheterization with      clear coronaries, but anomalous left main.  The patient for further      cardiac workup with cardiac CT in the [facility] office.  2.  A 2-3+ mitral regurgitation.  3.  Normal left ventricular function.  4.  Fatigue, questionable etiology.  TSH low normal. Questionably may be      related to medications, the patient's HCT discontinued.  Follow-up      outpatient.   DISPOSITION:  The patient being discharged to home with instructions to  continue;  1.  Aspirin 81 mg daily.  2.  Avapro 150 mg daily.  3.  Potassium 20 mEq daily.  4.  Nexium 40 mg daily.  5.  Zocor 40 mg each evening.  6.  The patient instructed to discontinue her HCTZ.   ACTIVITY:  No lifting over 10 pounds x1 week.  No driving for two days.   DIET:  Low fat, low salt, low cholesterol.   WOUND CARE:  Gently clean cath site with soap and water, no tub bathing x2  days.   FOLLOWUP:  BMET in one week to be arranged through the [place] office.  Cardiac CT in [place] to be arranged by Dr. Corinda Gubler at discharge appointment.  Follow-up with Dr. Corinda Gubler April 18, 2005 at 10:30.   HOSPITAL COURSE:  Ms. Vangieson is a 65 year old female followed by Dr. Glennon Hamilton with past medical history including hypertension, hyperlipidemia,  gastroesophageal reflux disease, status post hysterectomy, status post cyst  removal in her breast, status post lymph node biopsy in the neck region,  status  post bilateral tubal ligation.  She was recently admitted to [facility] from March 20, 2005 to March 21, 2005 for chest discomfort.  She  was ruled out for a myocardial infarction, she also ruled out for a  pulmonary embolism with a CT scan of her chest.  She was noted to have a  systolic murmur and had an echocardiogram that revealed some aortic  sclerosis without any evidence of stenosis.  She also had a normal LV  function.  Her symptoms resolved and the patient was discharged home.  Ms.  Streets was seen by Dr. Glennon Hamilton in the office, the patient continued to  complain of chest pain.  EKG with changes from her previous EKG which was  done in the hospital on March 20, 2005, at which time she only had inverted T  waves in lead V1.  Last EKG revealed a sinus rhythm of  77 with an inverted T  wave in leads V1 through V4, and also in lead 3.  The patient was admitted  to [facility] for further evaluation/cardiac catheterization.  The  patient was taken to the Cath Lab on March 29, 2005 by Dr. Gala Romney, results  as stated above.  The patient tolerated the procedure without complications,  was found to be hypokalemic, supplemented with potassium.  The patient to be  discharged home with further workup with a chest CT in [place], and a followup  appointment in [place] with Dr. Glennon Hamilton.       MB/MEDQ  D:  03/30/2005  T:  03/30/2005  Job:  [id number]   cc:   Dirk Dress. Katrinka Blazing, M.D.  [address]  [place]  [place] [address]  Fax: [phone]   Glennon Hamilton, MD  [facility]

## 2011-01-27 NOTE — Discharge Summary (Signed)
NAMEGEORGIA, DELSIGNORE                 ACCOUNT NO.:  [id number]   MEDICAL RECORD NO.:  [id number]          PATIENT TYPE:  INP   LOCATION:  2004                         FACILITY:  [facility]   PHYSICIAN:  Arvilla Meres, M.D. [facility] OF BIRTH:  1945-11-06   DATE OF ADMISSION:  03/28/2005  DATE OF DISCHARGE:  03/30/2005                                 DISCHARGE SUMMARY   ADDENDUM:  I spoke with the staff at our [facility] office; the patient will be  scheduled for a cardiac CT with Dr. Andee Lineman prior to her appointment with Dr.  Corinda Gubler on April 18, 2005.  Delice Bison at the [place] office will call the patient at  home with a set date for the cardiac CT, it will probably be April 12, 2005  or April 13, 2005.       MB/MEDQ  D:  03/30/2005  T:  03/30/2005  Job:  [id number]   cc:   Glennon Hamilton, MD  [facility]

## 2011-01-27 NOTE — Discharge Summary (Signed)
NAMEKERRIANN, [name]                 ACCOUNT NO.:  [id number]   MEDICAL RECORD NO.:  [id number]          PATIENT TYPE:  INP   LOCATION:  A206                          [facility]AC[facility]   PHYSICIAN:  Osvaldo Shipper, MD     DATE OF BIRTH:  May 20, 1946   DATE OF ADMISSION:  03/20/2005  DATE OF DISCHARGE:  07/11/2006LH                                 DISCHARGE SUMMARY   DISCHARGE DIAGNOSES:  1.  Chest pain, atypical, likely secondary to gastroesophageal reflux      disease.  2.  Hypertension.  3.  Bradycardia.   Please review H&P dictated yesterday for details regarding patient's  presenting illness.   HOSPITAL COURSE:  CHEST PAIN:  Patient is a 65 year old African-American  female with past medical history of hypertension, GERD, no history of  coronary artery disease.  Presented with retrosternal chest pressure which  started the night prior to the day of admission.  The pain was continuous  until the next day morning when she presented to the ER.  The pain was more  to the right side of the chest than the left.  Because of the atypical  nature of her pain we ordered a D-dimer initially which did come back  elevated.  However, her CAT scan of the chest was negative for PE.  Subsequently, we admitted the patient to rule her out by doing cardiac  enzymes and the patient, in fact, did rule out.  We heard a systolic murmur  at the aortic area on admission.  This did not radiate to the carotids.  However, we did order echocardiogram which has been done but the report is  pending at this time.  Patient's symptoms have completely resolved.  She  does not have any shortness of breath or palpitation or diaphoresis.  Based  on all of the above patient is considered stable for discharge.  Will order  an outpatient consultation with [facility] for cardiac stress test  as we believe she might benefit from it considering her history of  hypertension for which she requires three medications.   We also told her to  hold her Ziac which has a beta blocker in it because of her profound  bradycardia which she is asymptomatic with it.  We will prescribe her  hydrochlorothiazide and she needs to refrain from using Ziac until she is  seen by [place] at which point a low dose beta blocker may be initiated.  The above plan is to be discussed with the patient as well as with patient's  husband and they are both agreeable.   DISCHARGE MEDICATIONS:  1.  Hydrochlorothiazide 25 mg p.o. daily.  2.  Please hold Ziac until seen by [place].  3.  Patient will resume all of her other outpatient medications.   FOLLOW-UP CARE:  1.  With [facility].  Appointment will be made for consultation      prior to stress test.  2.  With Dr. Elpidio Anis on p.r.n. basis.   DIET:  Patient will have a cardiac diet.   Physical activity as before.  STUDIES DONE:  1.  CAT scan of the chest to rule out pulmonary embolism as mentioned above.  2.  2-D echocardiogram.  Results are pending.  3.  Electrocardiograms were done on this patient which showed just      bradycardia and some nonspecific T changes which remain stable on      subsequent EKGs.   Addendum:  2dECHO showed minimal aortic sclerosis. Normal left ventricular function  with no wall-motion abnormality.       GK/MEDQ  D:  03/21/2005  T:  03/21/2005  Job:  [id number]   cc:   Vida Roller, M.D.  Fax: [phone]   Dirk Dress Katrinka Blazing, M.D.  [address]  [place]  [place] [address]  Fax: [phone]

## 2011-01-27 NOTE — Procedures (Signed)
NAMESHAKEIA, KRUS                 ACCOUNT NO.:  [id number]   MEDICAL RECORD NO.:  [id number]          PATIENT TYPE:  INP   LOCATION:  A206                          FACILITY:  [facility]   PHYSICIAN:  Oriole Beach Bing, M.D.  DATE OF BIRTH:  [birth year]   DATE OF PROCEDURE:  03/21/2005  DATE OF DISCHARGE:                                  ECHOCARDIOGRAM   REFERRING:  Dr. Rito Ehrlich   CLINICAL DATA:  A 65 year old woman with prior myocardial infarction and  aortic valve disease.   Aorta 2.6, left atrium 5.1, septum 1.3, posterior wall 1.1, LV diastole 4.3,  LV systole 2.6.   1.  Technically adequate echocardiographic study.  2.  Mild left atrial enlargement; normal right atrium and right ventricle.  3.  Trileaflet aortic valve with minimal sclerosis and no stenosis.  4.  Normal mitral valve; trivial regurgitation.  5.  Normal tricuspid valve; physiologic regurgitation; estimated RV systolic      pressure at the upper limit of normal.  6.  Normal pulmonic valve.  7.  Normal left ventricular size; borderline hypertrophy; normal regional      and global function.  8.  Normal IVC.       RR/MEDQ  D:  03/21/2005  T:  03/21/2005  Job:  [id number]

## 2011-05-13 HISTORY — PX: KNEE ARTHROSCOPY W/ DEBRIDEMENT: SHX1867

## 2011-05-16 ENCOUNTER — Other Ambulatory Visit (HOSPITAL_COMMUNITY): Payer: Self-pay | Admitting: Otolaryngology

## 2011-05-16 DIAGNOSIS — R599 Enlarged lymph nodes, unspecified: Secondary | ICD-10-CM

## 2011-05-22 ENCOUNTER — Other Ambulatory Visit: Payer: Self-pay | Admitting: Interventional Radiology

## 2011-05-22 ENCOUNTER — Ambulatory Visit (HOSPITAL_COMMUNITY)
Admission: RE | Admit: 2011-05-22 | Discharge: 2011-05-22 | Disposition: A | Discharge status: Home / Self Care | Payer: Medicare Other | Source: Ambulatory Visit | Attending: Otolaryngology | Admitting: Otolaryngology

## 2011-05-22 DIAGNOSIS — K116 Mucocele of salivary gland: Secondary | ICD-10-CM | POA: Insufficient documentation

## 2011-05-22 DIAGNOSIS — R599 Enlarged lymph nodes, unspecified: Secondary | ICD-10-CM | POA: Insufficient documentation

## 2011-05-23 ENCOUNTER — Other Ambulatory Visit: Payer: Self-pay | Admitting: Otolaryngology

## 2011-05-23 DIAGNOSIS — R599 Enlarged lymph nodes, unspecified: Secondary | ICD-10-CM

## 2011-05-26 ENCOUNTER — Ambulatory Visit
Admission: RE | Admit: 2011-05-26 | Discharge: 2011-05-26 | Disposition: A | Discharge status: Home / Self Care | Payer: Medicare Other | Source: Ambulatory Visit | Attending: Otolaryngology | Admitting: Otolaryngology

## 2011-05-26 DIAGNOSIS — R599 Enlarged lymph nodes, unspecified: Secondary | ICD-10-CM

## 2011-05-26 MED ORDER — IOHEXOL 300 MG/ML  SOLN
75.0000 mL | Freq: Once | INTRAMUSCULAR | Status: AC | PRN
  Administered 2011-05-26: 75 mL via INTRAVENOUS

## 2011-05-31 ENCOUNTER — Ambulatory Visit (HOSPITAL_BASED_OUTPATIENT_CLINIC_OR_DEPARTMENT_OTHER)
Admission: RE | Admit: 2011-05-31 | Discharge: 2011-05-31 | Disposition: A | Discharge status: Home / Self Care | Payer: Medicare Other | Source: Ambulatory Visit | Attending: Orthopedic Surgery | Admitting: Orthopedic Surgery

## 2011-05-31 DIAGNOSIS — M659 Unspecified synovitis and tenosynovitis, unspecified site: Secondary | ICD-10-CM | POA: Insufficient documentation

## 2011-05-31 DIAGNOSIS — Z01812 Encounter for preprocedural laboratory examination: Secondary | ICD-10-CM | POA: Insufficient documentation

## 2011-05-31 DIAGNOSIS — Z01810 Encounter for preprocedural cardiovascular examination: Secondary | ICD-10-CM | POA: Insufficient documentation

## 2011-05-31 DIAGNOSIS — M2240 Chondromalacia patellae, unspecified knee: Secondary | ICD-10-CM | POA: Insufficient documentation

## 2011-05-31 DIAGNOSIS — S83259A Bucket-handle tear of lateral meniscus, current injury, unspecified knee, initial encounter: Secondary | ICD-10-CM | POA: Insufficient documentation

## 2011-05-31 LAB — POCT I-STAT 4, (NA,K, GLUC, HGB,HCT)
Potassium: 3.5 mEq/L (ref 3.5–5.1)
Sodium: 138 mEq/L (ref 135–145)

## 2011-05-31 NOTE — Op Note (Addendum)
[name], MCARTHY                 ACCOUNT NO.:  [id number]  MEDICAL RECORD NO.:  [id number]  LOCATION:  ULT                          FACILITY:  [facility]  PHYSICIAN:  Ollen Gross, M.D.    DATE OF BIRTH:  Jun 30, 1946  DATE OF PROCEDURE: DATE OF DISCHARGE:  05/22/2011                              OPERATIVE REPORT   PREOPERATIVE DIAGNOSIS:  Right knee lateral meniscal tear.  POSTOPERATIVE DIAGNOSIS:  Right knee lateral meniscal tear.  PROCEDURE:  Right knee arthroscopy with lateral meniscal debridement.  SURGEON:  Ollen Gross, M.D.  ASSISTANT:  No assistant.  ANESTHESIA:  General.  ESTIMATED BLOOD LOSS:  Minimal.  DRAINS:  None.  COMPLICATIONS:  None.  CONDITION:  Stable to recovery.  BRIEF CLINICAL NOTE:  [name] is a 65 year old female with several month history of significant right knee pain and mechanical symptoms. Exam and history suggested a lateral meniscal tear confirmed by MRI. She presents for arthroscopy and debridement.  PROCEDURE IN DETAIL:  After successful administration of a general anesthetic, a tourniquet was placed high on her right thigh, and right lower extremity was  prepped and draped in the usual sterile fashion. Standard superomedial and inferolateral incisions were made, inflow cannula passed superomedial, camera passed inferolateral.  Arthroscopic visualization proceeds.  Undersurface of patella and trochlea showed mild chondromalacia, but no evidence of any unstable cartilage or cartilage defects.  Medial and lateral gutters were visualized.  There were no loose bodies.  Flexion valgus force was applied to the knee and the medial compartment was entered.  There was a normal-appearing medial compartment which has mild chondromalacia.  There was no meniscal tear. Spinal needle was used to localize the inferomedial portal.  Small incision made and dilator placed.  Intercondylar notch was then visualized.  There was lot of hypertrophic tissue  surrounding the ACL which was inflamed and I removed that.  ACL was intact and looks normal. Lateral compartment was entered and has a bad bucket-handle tear on the anterior horn body and extending to the junction of the body and posterior horn.  The meniscus was essentially shredded up in this region.  Using combination of baskets, a 4.2 mm shaver and the ArthroCare to debride this and then sealed up the edges with the ArthroCare.  Another small fragment had also had gone into lateral gutter and that was removed.  The lateral compartment articular cartilage showed some chondromalacia, but no full-thickness defects or unstable cartilage.  Joint was again inspected.  No other tears, defects or loose bodies noted.  There was a fair amount of synovitis present in the suprapatellar area and I used the ArthroCare to debride that.  The arthroscopic equipment was then removed from the inferior portals which were closed with interrupted 4-0 nylon.  20 mL of 0.25% Marcaine with epinephrine were injected through the inflow cannula and that was removed and that portal closed with nylon.  The incisions were cleaned and dried and a bulky sterile dressing applied.  She was then awakened and transported to recovery in stable condition.     Ollen Gross, M.D.     FA/MEDQ  D:  05/31/2011  T:  05/31/2011  Job:  [id number]  Electronically Signed by Ollen Gross M.D. on 06/21/2011 11:16:21 AM

## 2011-06-07 ENCOUNTER — Other Ambulatory Visit: Payer: Self-pay

## 2011-06-20 ENCOUNTER — Encounter (HOSPITAL_COMMUNITY)
Admission: RE | Admit: 2011-06-20 | Discharge: 2011-06-20 | Disposition: A | Discharge status: Home / Self Care | Payer: Medicare Other | Source: Ambulatory Visit | Attending: Otolaryngology | Admitting: Otolaryngology

## 2011-06-20 ENCOUNTER — Other Ambulatory Visit (HOSPITAL_COMMUNITY): Payer: Self-pay | Admitting: Otolaryngology

## 2011-06-20 DIAGNOSIS — R591 Generalized enlarged lymph nodes: Secondary | ICD-10-CM

## 2011-06-20 LAB — BASIC METABOLIC PANEL
BUN: 18 mg/dL (ref 6–23)
Chloride: 101 mEq/L (ref 96–112)
Creatinine, Ser: 0.95 mg/dL (ref 0.50–1.10)
GFR calc Af Amer: 71 mL/min — ABNORMAL LOW (ref >90)

## 2011-06-20 LAB — CBC
HCT: 37.4 % (ref 36.0–46.0)
MCV: 81.8 fL (ref 78.0–100.0)
RDW: 13.7 % (ref 11.5–15.5)
WBC: 4.9 10*3/uL (ref 4.0–10.5)

## 2011-06-26 ENCOUNTER — Other Ambulatory Visit: Payer: Self-pay | Admitting: Otolaryngology

## 2011-06-27 ENCOUNTER — Other Ambulatory Visit: Payer: Self-pay | Admitting: Otolaryngology

## 2011-06-27 ENCOUNTER — Ambulatory Visit (HOSPITAL_COMMUNITY)
Admission: RE | Admit: 2011-06-27 | Discharge: 2011-06-27 | Disposition: A | Discharge status: Home / Self Care | Payer: Medicare Other | Source: Ambulatory Visit | Attending: Otolaryngology | Admitting: Otolaryngology

## 2011-06-27 DIAGNOSIS — Z01818 Encounter for other preprocedural examination: Secondary | ICD-10-CM | POA: Insufficient documentation

## 2011-06-27 DIAGNOSIS — E669 Obesity, unspecified: Secondary | ICD-10-CM | POA: Insufficient documentation

## 2011-06-27 DIAGNOSIS — Z01812 Encounter for preprocedural laboratory examination: Secondary | ICD-10-CM | POA: Insufficient documentation

## 2011-06-27 DIAGNOSIS — R599 Enlarged lymph nodes, unspecified: Secondary | ICD-10-CM | POA: Insufficient documentation

## 2011-06-27 DIAGNOSIS — I10 Essential (primary) hypertension: Secondary | ICD-10-CM | POA: Insufficient documentation

## 2011-06-28 NOTE — Op Note (Signed)
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  Pathology requested more tissue, so the patient is taken back for left supraclavicular lymph node biopsy.  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  Pathology requested more tissue, so the patient is taken back for left supraclavicular lymph node biopsy.  SURGEON:  Melvenia Beam, MD  PROCEDURE PERFORMED:  [phone] - L left supraclavicular deep cervical lymph node biopsy.  COMPLICATIONS:  None.  ANESTHESIA:  General via endotracheal.  ESTIMATED BLOOD LOSS:  Less than 25 mL.  DRAINS:  none.  DRESSINGS:  Bacitracin ointment to incision.  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  OPERATIVE DETAILS:  The patient was correctly identified in the preoperative holding area, brought to the operating room by Anesthesia, placed supine on the operating table, intubated without incident.  The left supraclavicular area was marked out with an incision in a natural skin crease.  This was injected with 1% lidocaine with epinephrine, and then the entire shoulder and neck were prepped and draped with sterile Betadine and sterile towels and drapes in the standard fashion.  Next, the incision was made with 15-blade.  This was carried down  to and through the platysma layer down to the subcutaneous fat.  The subcutaneous fat and lymph nodes were carefully dissected out and the supraclavicular fat pad using the combination of the mosquitoes and DeBakey and the Bovie electrocautery.  Several supraclavicular veins tributaries into the left supraclavicular vein were individually dissected out, clamped with mosquitoes, divided, and tied off with 2-0 silk.  Once the veins were tied off and divided, the left supraclavicular fat pad was carefully dissected out with the Bovie electrocautery, dissected off the deep layer of the deep cervical fascia and carefully dissected posteriorly off the left trapezius muscle.  The nerve branches to the left trapezius muscle were carefully identified and preserved.  Bovie was used to dissect the lymph node packet and fat off the trapezius anteriorly and off the deep cervical fascia on the deep side and then multiple Kelly clamps were used to clamp off the lymphatic channels on the deep side and then the specimen was removed using the Bovie electrocautery and passed off to be sent fresh for lymphoma workup.  2-0 silk stick ties were used to tie off the lymphatic channels and the tissue on the deep side.  Valsalva was performed, and the wound was irrigated and suctioned, and meticulous hemostasis was obtained using the bipolar cautery.  Once meticulous hemostasis was obtained, Surgicel was carefully placed in the wound for additional hemostasis,  and the platysma and muscular layers were closed with interrupted deep buried 4-0 Vicryl, and then the skin was closed with interrupted simple 3-0 plain gut sutures.  The wound was cleaned off and dressed with bacitracin ointment.  The patient was returned to the care of Anesthesia, awake from anesthesia, and extubated without incident. The patient tolerated procedure well with no immediate complications. Dr. Melvenia Beam was present and performed the  entire procedure.          ______________________________ Melvenia Beam, MD     MG/MEDQ  D:  06/27/2011  T:  06/27/2011  Job:  [id number]  Electronically Signed by Melvenia Beam MD on 06/28/2011 01:25:50 PM

## 2011-07-11 ENCOUNTER — Encounter: Payer: Medicare Other | Admitting: Oncology

## 2011-07-26 ENCOUNTER — Encounter: Payer: Self-pay | Admitting: Oncology

## 2011-07-26 ENCOUNTER — Other Ambulatory Visit: Payer: Self-pay | Admitting: Oncology

## 2011-07-26 DIAGNOSIS — R599 Enlarged lymph nodes, unspecified: Secondary | ICD-10-CM

## 2011-07-26 HISTORY — DX: Enlarged lymph nodes, unspecified: R59.9

## 2011-07-27 ENCOUNTER — Other Ambulatory Visit (HOSPITAL_BASED_OUTPATIENT_CLINIC_OR_DEPARTMENT_OTHER): Payer: Medicare Other | Admitting: Lab

## 2011-07-27 ENCOUNTER — Telehealth: Payer: Self-pay | Admitting: Oncology

## 2011-07-27 ENCOUNTER — Other Ambulatory Visit (HOSPITAL_COMMUNITY): Payer: Self-pay | Admitting: Oncology

## 2011-07-27 ENCOUNTER — Ambulatory Visit: Payer: Medicare Other

## 2011-07-27 ENCOUNTER — Ambulatory Visit (HOSPITAL_BASED_OUTPATIENT_CLINIC_OR_DEPARTMENT_OTHER): Payer: Medicare Other | Admitting: Oncology

## 2011-07-27 VITALS — BP 153/66 | HR 66 | Temp 97.2°F | Ht 63.5 in | Wt 185.3 lb

## 2011-07-27 DIAGNOSIS — K769 Liver disease, unspecified: Secondary | ICD-10-CM

## 2011-07-27 DIAGNOSIS — I10 Essential (primary) hypertension: Secondary | ICD-10-CM

## 2011-07-27 DIAGNOSIS — R160 Hepatomegaly, not elsewhere classified: Secondary | ICD-10-CM

## 2011-07-27 DIAGNOSIS — R599 Enlarged lymph nodes, unspecified: Secondary | ICD-10-CM

## 2011-07-27 HISTORY — DX: Hepatomegaly, not elsewhere classified: R16.0

## 2011-07-27 LAB — CBC WITH DIFFERENTIAL/PLATELET
BASO%: 1.1 % (ref 0.0–2.0)
HCT: 35.5 % (ref 34.8–46.6)
LYMPH%: 30.2 % (ref 14.0–49.7)
MCH: 28.5 pg (ref 25.1–34.0)
MCHC: 34.5 g/dL (ref 31.5–36.0)
MCV: 82.6 fL (ref 79.5–101.0)
MONO%: 8.4 % (ref 0.0–14.0)
NEUT%: 55.3 % (ref 38.4–76.8)
Platelets: 211 10*3/uL (ref 145–400)
RBC: 4.29 10*6/uL (ref 3.70–5.45)

## 2011-07-27 LAB — COMPREHENSIVE METABOLIC PANEL
Alkaline Phosphatase: 64 U/L (ref 39–117)
BUN: 15 mg/dL (ref 6–23)
CO2: 22 mEq/L (ref 19–32)
Glucose, Bld: 91 mg/dL (ref 70–99)
Sodium: 142 mEq/L (ref 135–145)
Total Bilirubin: 0.7 mg/dL (ref 0.3–1.2)
Total Protein: 6.7 g/dL (ref 6.0–8.3)

## 2011-07-27 NOTE — Progress Notes (Signed)
This office note has been dictated.  #[id number]

## 2011-07-27 NOTE — Progress Notes (Signed)
CC:   Melvenia Beam, MD  HISTORY:  [name] is a 65 year old African American woman whom I am asked to see in consultation for evaluation of lymphadenopathy.  [name] has been in generally excellent health.  She became aware of some asymptomatic swelling in her left neck in August 2012.  She sought medical attention.  A needle aspiration biopsy carried out under ultrasound guidance of the left cervical lymph node on 05/22/2011 showed lymphoid tissue but no evidence for cancer.  It was recommended that additional tissue be submitted.  On 06/07/2011, the patient underwent deep cervical lymph node biopsy on the left side.  This showed atypical lymphoid proliferation felt to be highly suspicious for low-grade follicular lymphoma.  Once again, the material was not considered diagnostic.  Flow cytometry report showed a minor B-cell population present with Kappa light chain excess.  Findings were felt to be worrisome for a B-cell lymphoproliferative process.  Once again, additional tissue was recommended.  The patient underwent biopsy of multiple lymph nodes in the left supraclavicular area on 06/26/2011. The final diagnosis was multiple lymph nodes with focal atypical lymphoid proliferation.  In the comment section, it was noted that in 2 small areas there were atypical follicular structures characterized by the predominance of small angulated lymphoid cells.  Flow cytometric analysis performed on representative tissue failed to show any monoclonal B-cell population or abnormal T-cell phenotype.  The findings were not felt to be diagnostic of malignancy.  Flow studies showed no monoclonal B-cell population or abnormal T-cell phenotype.  The patient has undergone CT scan of the neck with IV contrast on 05/26/2011.  There were numerous parotid lesions bilaterally felt to represent lymph nodes. Possibilities were reactive findings, possibly related to sarcoidosis, Sjogren's disease,  metastatic cancer, lupus and lymphoma.  An index lymph node in the left supraclavicular region measured 1.8 x 1 cm.  The patient had previously undergone an MRI of the abdomen with and without IV contrast on 05/02/2010.  There was a liver mass in the inferior right hepatic lobe measuring 5.4 x 3.0 cm that was felt to show no significant change compared with the prior study of 09/07/2008.  On an MRI that had been carried out on 05/07/2006, however, the mass measured 3.7 x 2.2 x 3.5 cm indicating some progression.  It was felt that this lesion was most likely benign given its minimal growth.  In addition, the patient also had mesenteric lymphadenopathy in the left abdomen with no significant change.  The largest lymph node measured 1.9 cm in short axis.  There was mild portal hepatis and portal caval lymphadenopathy that was felt to be stable.  There was mild retroperitoneal adenopathy in the left periaortic region showing mild increase.  The largest lymph node measured 1.2 cm as compared with 0.6 cm on a prior study.  There was no ascites.  The patient has never had a biopsy of the liver lesion. She has had several studies.  An ultrasound from 04/18/2005 also suggested a mass in the inferior right hepatic lobe.  As stated, the patient is here for further evaluation.  PAST MEDICAL HISTORY:  Notable for a long history of hypertension, dyslipidemia and GERD.  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  She had a cardiac catheterization in 2007.  I should mention the patient denies any possible exposure to AIDS or any behaviors that would place her at such risk.  There are also no symptoms to suggest an  immune disorder.  PAST SURGICAL HISTORY:  The patient has had bilateral breast biopsies, a total of 4, that have all resulted in benign findings.  She had a tubal ligation many years ago.  She had a total abdominal hysterectomy and bilateral salpingo-oophorectomy  due to excessive menstrual bleeding related to fibroids, also many years ago.  The patient tells me that 18 years ago she had a biopsy of a lymph node in her anterior neck region that was benign.  She underwent arthroscopy on her right knee on 05/28/2011.  No history of any injuries.  No history of blood transfusions.  ALLERGIES:  SHE IS APPARENTLY ALLERGIC TO LATEX WHICH CAUSES SWELLING AND A RASH.  LASIX MAKES HER FEEL WEAK BUT DOES NOT APPEAR TO BE A TRUE ALLERGY.  FAMILY HISTORY:  Father died at age 44 of pancreatic cancer.  Mother has dementia, age 48, and has been followed by me for breast cancer and a monoclonal gammopathy that seems to be benign.  A brother has had surgery for brain lesions, possibly tumors, also kidney cancer.  A sister has sarcoidosis.  The patient has 2 sons, ages 32 and 57.  Her oldest son has obesity, hypertension and diabetes.  No grandchildren.  SOCIAL HISTORY:  No use of tobacco products, alcohol or IV drugs.  The patient was born in [place].  She has 1 year of college.  She has been married 45 years to her husband [name].  They live in [place]. She worked for [employer] for 19 years, and for [employer] for 10 years.  She retired in 2007.  She drives.  She is involved in taking care of her mother who is currently at home and has dementia.  REVIEW OF SYSTEMS:  The patient complains of some fatigue but no recent changes and really no sense of ill health or any recent changes in her sense of well-being.  She denies any neurologic problems.  She uses glasses for reading.  Vision and hearing are good.  She may have some sinus problems.  No history of hay fever.  She has some reflux.  No abdominal pain.  Weight has been stable.  No diarrhea, constipation or obvious blood in the stools.  She had a colonoscopy in 2007 by Dr. Karilyn Cota.  No history of any liver problems, jaundice or hepatitis.  No cardiac symptoms.  She does have some dyspnea on  exertion in climbing stairs.  She had a mammogram in June 2011.  She also has breast MRIs carried out that are in the system.  No urinary symptoms.  She has some swelling of the legs.  No history of blood clots or intermittent claudication.  She bruises easily with trauma.  No undue bleeding.  No arthritis or musculoskeletal pains.  She has had arthroscopy of her right knee.  No fever.  She has had sweating at night for the past 4-5 years without change.  No skin rashes, pruritus or skin cancers.  No depression or emotional problems.  PHYSICAL EXAMINATION:  General:  [name] is a generally healthy- appearing woman, somewhat obese, very pleasant.  Her weight is 185 pounds, 4.8 ounces, height 5 feet, 3-1/2 inches.  Body surface area 1.94 m2.  Blood pressure 153/66 in the left arm sitting.  Pulse 66 and regular, respirations 20 and unlabored.  HEENT:  The patient wears glasses.  No scleral icterus.  Pupillary and extraocular movements are normal.  Mouth and pharynx are benign.  Dentition is excellent.  Neck: The patient is still postsurgical, and in fact, there still is some swelling in her left neck which is somewhat indistinct.  There is firmness.  I cannot palpate individual nodes.  These changes could be postsurgical, also indicative of some adenopathy.  No posterior cervical, right cervical or right supraclavicular adenopathy.  No obvious thyroid enlargement or bruit.  Lungs:  Clear to percussion and auscultation.  Cardiac:  Regular rhythm without murmur or rub.  Back: No skeletal tenderness or deformity.  Lymphatics:  No axillary or inguinal adenopathy.  Breasts:  Not examined.  Abdomen:  Somewhat obese but benign with no organomegaly or masses palpable.  No tenderness. Extremities:  No pitting edema, clubbing, palmar erythema, petechiae, or purpura.  Neurologic:  Grossly normal.  LABORATORY DATA:  Today white count 3.8, ANC 2.1 hemoglobin 12.2, hematocrit 35.5, and platelets  211,000.  Chemistries are entirely normal.  BUN 15, creatinine 0.99, albumin 4.8.  LDH 161.  IMPRESSION AND PLAN:  This patient has lymphadenopathy that has been biopsied several times in her left neck and left supraclavicular area. Interestingly, imaging studies, specifically MRI that go back to 2007, i.e., more than 5 years ago, suggested some lymphadenopathy.  The spleen has been normal.  In addition, the patient has an undiagnosed mass in her liver that seems to be slowly enlarging.  Etiology for these findings are unclear.  The biopsies that have been carried out do not seem to indicate a lymphoma or other malignant condition.  I should mention that the patient did have an anti-DNA antibody carried out that was less than 1, and complement C3 was normal.  ANA direct was negative.  I will go ahead and obtain CT scan of the chest, abdomen and pelvis with IV and p.o. contrast.  I will plan to review the  biopsy findings with the pathologist.  If the patient's lymphadenopathy appears to be generally stable and is asymptomatic and the patient at this time seems to be feeling well, then I am inclined to be very conservative. Certainly we could check HIV status, also check an ACE level to look for sarcoidosis.  Of greater concern is the slow-growing lesion in the liver.  This may be a low-grade hepatocellular carcinoma.  The patient may need a biopsy or perhaps another MRI or even a PET scan.  The patient was reassured.  We will go ahead and obtain the CT scans, and I will see [name] again in about 2 months at which time we will check CBC and chemistries.  We may want to add on some additional studies after I have had a chance to review the CT scans and the pathology.    ______________________________ Samul Dada, M.D. DSM/MEDQ  D:  07/27/2011  T:  07/27/2011  Job:  [id number]

## 2011-08-02 ENCOUNTER — Other Ambulatory Visit: Payer: Self-pay | Admitting: Oncology

## 2011-08-02 ENCOUNTER — Ambulatory Visit (HOSPITAL_COMMUNITY)
Admission: RE | Admit: 2011-08-02 | Discharge: 2011-08-02 | Disposition: A | Discharge status: Home / Self Care | Payer: Medicare Other | Source: Ambulatory Visit | Attending: Oncology | Admitting: Oncology

## 2011-08-02 DIAGNOSIS — Q619 Cystic kidney disease, unspecified: Secondary | ICD-10-CM | POA: Insufficient documentation

## 2011-08-02 DIAGNOSIS — R911 Solitary pulmonary nodule: Secondary | ICD-10-CM | POA: Insufficient documentation

## 2011-08-02 DIAGNOSIS — K7689 Other specified diseases of liver: Secondary | ICD-10-CM | POA: Insufficient documentation

## 2011-08-02 DIAGNOSIS — R928 Other abnormal and inconclusive findings on diagnostic imaging of breast: Secondary | ICD-10-CM | POA: Insufficient documentation

## 2011-08-02 DIAGNOSIS — R599 Enlarged lymph nodes, unspecified: Secondary | ICD-10-CM | POA: Insufficient documentation

## 2011-08-02 DIAGNOSIS — M47814 Spondylosis without myelopathy or radiculopathy, thoracic region: Secondary | ICD-10-CM | POA: Insufficient documentation

## 2011-08-02 DIAGNOSIS — K5730 Diverticulosis of large intestine without perforation or abscess without bleeding: Secondary | ICD-10-CM | POA: Insufficient documentation

## 2011-08-02 MED ORDER — IOHEXOL 300 MG/ML  SOLN
100.0000 mL | Freq: Once | INTRAMUSCULAR | Status: AC | PRN
  Administered 2011-08-02: 100 mL via INTRAVENOUS

## 2011-08-07 ENCOUNTER — Other Ambulatory Visit: Payer: Self-pay | Admitting: Oncology

## 2011-08-07 DIAGNOSIS — R160 Hepatomegaly, not elsewhere classified: Secondary | ICD-10-CM

## 2011-08-07 DIAGNOSIS — N2889 Other specified disorders of kidney and ureter: Secondary | ICD-10-CM | POA: Insufficient documentation

## 2011-08-08 NOTE — Progress Notes (Signed)
CT scans of the chest, abdomen, and pelvis from 08/02/2011 were reviewed on this patient.  As noted in the report, there seems to be an enlarging left lower pole renal lesion, which did not appear to be cystic on the prior MRI of the abdomen that was carried out on 05/02/2010.  On that scan, one can see the very subtle, early development of this lesion, which is clearly now bigger.  An enlarging left periaortic lymph node was also noted on the most recent CT scan.  The lesion in the medial lower pole of the left kidney measures 2.5 x 1.7 cm.  In retrospect, on the prior study, specifically the MRI, this was present and measuring 1.5 x 1.3 cm.  The left periaortic lymph node now has a short axis diameter of 1.5 cm compared with 1.2 cm.  The lesion in the liver appears to be enhancing.  In my review of the MRI of the abdomen carried out with and without contrast on 05/02/2010, there is a mass in the inferior right hepatic lobe, which measures 5.4 x 3.0 cm, showing no significant change since the prior study of 09/07/2008.  However, if one goes back to the MRI of 05/07/2006, this lesion measured 3.7 x 2.2 x 3.5 cm and, thus, has enlarged.  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  It does not appear to be a typical hemangioma.  It was suggested that to work this up, an Community education officer protocol with an MRI might be helpful in determining whether this is a low-grade hepatocellular carcinoma.  I also reviewed the CT scan of the neck with IV contrast carried out on 05/26/2011.    ______________________________ Samul Dada, M.D. DSM/MEDQ  D:  08/07/2011  T:  08/08/2011  Job:  [id number]

## 2011-08-09 ENCOUNTER — Telehealth: Payer: Self-pay | Admitting: Oncology

## 2011-08-09 NOTE — Telephone Encounter (Signed)
lmonvm advising the pt of her mri appt on 08/16/2011@10 :00am

## 2011-08-16 ENCOUNTER — Ambulatory Visit (HOSPITAL_COMMUNITY)
Admission: RE | Admit: 2011-08-16 | Discharge: 2011-08-16 | Disposition: A | Discharge status: Home / Self Care | Payer: Medicare Other | Source: Ambulatory Visit | Attending: Oncology | Admitting: Oncology

## 2011-08-16 DIAGNOSIS — K7689 Other specified diseases of liver: Secondary | ICD-10-CM | POA: Insufficient documentation

## 2011-08-16 DIAGNOSIS — R599 Enlarged lymph nodes, unspecified: Secondary | ICD-10-CM | POA: Insufficient documentation

## 2011-08-16 DIAGNOSIS — R160 Hepatomegaly, not elsewhere classified: Secondary | ICD-10-CM

## 2011-08-16 DIAGNOSIS — N289 Disorder of kidney and ureter, unspecified: Secondary | ICD-10-CM | POA: Insufficient documentation

## 2011-08-16 DIAGNOSIS — N2889 Other specified disorders of kidney and ureter: Secondary | ICD-10-CM

## 2011-08-16 MED ORDER — GADOBENATE DIMEGLUMINE 529 MG/ML IV SOLN
20.0000 mL | Freq: Once | INTRAVENOUS | Status: AC | PRN
  Administered 2011-08-16: 17 mL via INTRAVENOUS

## 2011-08-20 ENCOUNTER — Other Ambulatory Visit: Payer: Self-pay | Admitting: Oncology

## 2011-08-20 DIAGNOSIS — N2889 Other specified disorders of kidney and ureter: Secondary | ICD-10-CM

## 2011-08-21 ENCOUNTER — Other Ambulatory Visit: Payer: Self-pay | Admitting: Medical Oncology

## 2011-08-21 ENCOUNTER — Telehealth: Payer: Self-pay | Admitting: Medical Oncology

## 2011-08-21 NOTE — Progress Notes (Signed)
I reviewed the MRI of the abdomen with and without IV contrast carried out on [name].  The study was done on December 5th and compared with prior studies, specifically the MRI of 05/21/10.  Particular attention was focused on the lesion in the medial aspect of the lower pole left kidney which measured 18 x 17 mm and demonstrated mild enhancement.  This lesion was apparently present on an MRI carried out on 05-21-2010, was very subtle and looked like a cortical bulge and measured 14 x 12 mm.  Thus the lesion has increased in size.  It was suggested that this lesion could be a renal cell carcinoma or possibly a non-Hodgkin's lymphoma.  It was suggested by the radiologist that a core needle biopsy be obtained.  There was also an atypical lesion within the inferior right hepatic lobe measuring 5.2 x 2.9 cm, comparable to the size on the prior MRI of 05-21-2010.  The lesion is hyperintense on T2 weighted imaging and restricts diffusion.  The lesion is hypodense on T1 weighted images and demonstrates minimal enhancement.  Findings were not felt to be typical for hemangioma or focal nodular hyperplasia.  It was suggested that this patient have an Eovist MRI study in approximately 6 months,  which would be somewhere around the middle of the year 2013.  We will go ahead and I have ordered a core needle biopsy of the mass in the left kidney.    ______________________________ Samul Dada, M.D. DSM/MEDQ  D:  08/20/2011  T:  08/21/2011  Job:  [id number]

## 2011-08-21 NOTE — Telephone Encounter (Signed)
I called pt per Dr. Arline Asp to let her know he is setting her up to get a biopsy of the lesion in her left kidney. She asked how soon because she does not want to do it until after Christmas. I told her I will have to discuss with Dr. Arline Asp and give her a call back.

## 2011-08-21 NOTE — Telephone Encounter (Signed)
I called pt back to let her know that it is ok with Dr. Arline Asp to do the biopsy of her left kidney lesion after Christmas. We will get his appointment set up.

## 2011-08-22 ENCOUNTER — Other Ambulatory Visit: Payer: Self-pay | Admitting: Oncology

## 2011-08-22 DIAGNOSIS — N2889 Other specified disorders of kidney and ureter: Secondary | ICD-10-CM

## 2011-09-04 ENCOUNTER — Telehealth: Payer: Self-pay | Admitting: Oncology

## 2011-09-04 NOTE — Telephone Encounter (Signed)
Gave pt appt for radiation , she does not want any biopsy before holiday is over. She also wants nurse to talk to her to explain why  She is going to see Radiation oncology

## 2011-09-04 NOTE — Telephone Encounter (Signed)
Talked to pt , gave pt appt for 09/15/11

## 2011-09-06 ENCOUNTER — Encounter: Payer: Self-pay | Admitting: Medical Oncology

## 2011-09-06 ENCOUNTER — Other Ambulatory Visit: Payer: Self-pay | Admitting: Medical Oncology

## 2011-09-06 ENCOUNTER — Telehealth: Payer: Self-pay | Admitting: Medical Oncology

## 2011-09-06 NOTE — Telephone Encounter (Signed)
Pt called asking why she is going to radiation. Dr. Arline Asp did mention this to her. I explained she is getting a biopsy of her renal mass in radiology but when I looked at appointments she is scheduled for radiation oncology.  I spoke with Rose who states Dr. Arline Asp ordered the biopsy but he chose radiation oncology instead of Interventional radiology. I explained this to pt and she is aware that Okey Dupre will call her with new appointment. I cancelled appointments in radiation oncology.

## 2011-09-07 ENCOUNTER — Telehealth: Payer: Self-pay | Admitting: Oncology

## 2011-09-07 NOTE — Telephone Encounter (Signed)
Talked [name],Radiology scheduling, about the biopsy , they will get back with [place] on the date.

## 2011-09-15 ENCOUNTER — Ambulatory Visit: Payer: Medicare Other

## 2011-09-15 ENCOUNTER — Encounter (HOSPITAL_COMMUNITY): Payer: Self-pay

## 2011-09-15 ENCOUNTER — Ambulatory Visit: Payer: Medicare Other | Admitting: Radiation Oncology

## 2011-09-18 ENCOUNTER — Other Ambulatory Visit: Payer: Self-pay | Admitting: Radiology

## 2011-09-19 DIAGNOSIS — M23305 Other meniscus derangements, unspecified medial meniscus, unspecified knee: Secondary | ICD-10-CM | POA: Diagnosis not present

## 2011-09-20 ENCOUNTER — Ambulatory Visit (HOSPITAL_COMMUNITY)
Admission: RE | Admit: 2011-09-20 | Discharge: 2011-09-20 | Disposition: A | Discharge status: Home / Self Care | Payer: Medicare Other | Source: Ambulatory Visit | Attending: Oncology | Admitting: Oncology

## 2011-09-20 ENCOUNTER — Other Ambulatory Visit: Payer: Self-pay | Admitting: Interventional Radiology

## 2011-09-20 ENCOUNTER — Encounter (HOSPITAL_COMMUNITY): Payer: Self-pay

## 2011-09-20 DIAGNOSIS — C8589 Other specified types of non-Hodgkin lymphoma, extranodal and solid organ sites: Secondary | ICD-10-CM | POA: Diagnosis not present

## 2011-09-20 DIAGNOSIS — N289 Disorder of kidney and ureter, unspecified: Secondary | ICD-10-CM | POA: Diagnosis not present

## 2011-09-20 DIAGNOSIS — N2889 Other specified disorders of kidney and ureter: Secondary | ICD-10-CM

## 2011-09-20 LAB — BASIC METABOLIC PANEL
BUN: 15 mg/dL (ref 6–23)
CO2: 27 mEq/L (ref 19–32)
Chloride: 103 mEq/L (ref 96–112)
Creatinine, Ser: 0.93 mg/dL (ref 0.50–1.10)

## 2011-09-20 LAB — CBC
HCT: 34.1 % — ABNORMAL LOW (ref 36.0–46.0)
Hemoglobin: 11.7 g/dL — ABNORMAL LOW (ref 12.0–15.0)
MCH: 27.5 pg (ref 26.0–34.0)
MCHC: 34.3 g/dL (ref 30.0–36.0)
RDW: 13.6 % (ref 11.5–15.5)

## 2011-09-20 MED ORDER — HYDROCODONE-ACETAMINOPHEN 5-325 MG PO TABS
ORAL_TABLET | ORAL | Status: AC
  Filled 2011-09-20: qty 1

## 2011-09-20 MED ORDER — HYDROCODONE-ACETAMINOPHEN 5-325 MG PO TABS
1.0000 | ORAL_TABLET | Freq: Once | ORAL | Status: AC
  Administered 2011-09-20: 1 via ORAL

## 2011-09-20 MED ORDER — MIDAZOLAM HCL 5 MG/5ML IJ SOLN
INTRAMUSCULAR | Status: AC | PRN
  Administered 2011-09-20: 2 mg via INTRAVENOUS

## 2011-09-20 MED ORDER — SODIUM CHLORIDE 0.9 % IV SOLN
INTRAVENOUS | Status: DC
  Administered 2011-09-20: 13:00:00 via INTRAVENOUS

## 2011-09-20 MED ORDER — FENTANYL CITRATE 0.05 MG/ML IJ SOLN
INTRAMUSCULAR | Status: AC | PRN
  Administered 2011-09-20: 100 ug via INTRAVENOUS

## 2011-09-20 NOTE — H&P (Signed)
[name] is an 66 y.o. female.    Samul Dada, MD  08/08/2011 9:02 PM  Signed  CC:  Melvenia Beam, MD   HISTORY:  [name] is a 66 year old African American woman whom I am asked to see in consultation for evaluation of lymphadenopathy.  Ms. [name] has been in generally excellent health.  She became aware of some asymptomatic swelling in her left neck in August 2012.  She sought medical attention.  A needle aspiration biopsy carried out under ultrasound guidance of the left cervical lymph node on 05/22/2011 showed lymphoid tissue but no evidence for cancer.  It was recommended that additional tissue be submitted.  On 06/07/2011, the patient underwent deep cervical lymph node biopsy on the left side.  This showed atypical lymphoid proliferation felt to be highly suspicious for low-grade follicular lymphoma.  Once again, the material was not considered diagnostic.  Flow cytometry report showed a minor B-cell population present with Kappa light chain excess.  Findings were felt to be worrisome for a B-cell lymphoproliferative process.  Once again, additional tissue was recommended.  The patient underwent biopsy of multiple lymph nodes in the left supraclavicular area on 06/26/2011. The final diagnosis was multiple lymph nodes with focal atypical lymphoid proliferation.  In the comment section, it was noted that in 2 small areas there were atypical follicular structures characterized by the predominance of small angulated lymphoid cells.  Flow cytometric analysis performed on representative tissue failed to show any monoclonal B-cell population or abnormal T-cell phenotype.  The findings were not felt to be diagnostic of malignancy.  Flow studies showed no monoclonal B-cell population or abnormal T-cell phenotype.  The patient has undergone CT scan of the neck with IV contrast on 05/26/2011.  There were numerous parotid lesions bilaterally felt to represent lymph  nodes. Possibilities were reactive findings, possibly related to sarcoidosis, Sjogren's disease, metastatic cancer, lupus and lymphoma.  An index lymph node in the left supraclavicular region measured 1.8 x 1 cm.  The patient had previously undergone an MRI of the abdomen with and without IV contrast on 05/02/2010.  There was a liver mass in the inferior right hepatic lobe measuring 5.4 x 3.0 cm that was felt to show no significant change compared with the prior study of 09/07/2008.  On an MRI that had been carried out on 05/07/2006, however, the mass measured 3.7 x 2.2 x 3.5 cm indicating some progression.  It was felt that this lesion was most likely benign given its minimal growth.  In addition, the patient also had mesenteric lymphadenopathy in the left abdomen with no significant change.  The largest lymph node measured 1.9 cm in short axis.  There was mild portal hepatis and portal caval lymphadenopathy that was felt to be stable.  There was mild retroperitoneal adenopathy in the left periaortic region showing mild increase.  The largest lymph node measured 1.2 cm as compared with 0.6 cm on a prior study.  There was no ascites.  The patient has never had a biopsy of the liver lesion. She has had several studies.  An ultrasound from 04/18/2005 also suggested a mass in the inferior right hepatic lobe.  As stated, the patient is here for further evaluation.   PAST MEDICAL HISTORY:  Notable for a long history of hypertension, dyslipidemia and GERD.  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  She had a cardiac catheterization in 2007.  I should mention the patient denies any possible  exposure to AIDS or any behaviors that would place her at such risk.  There are also no symptoms to suggest an immune disorder.   PAST SURGICAL HISTORY:  The patient has had bilateral breast biopsies, a total of 4, that have all resulted in benign findings.  She had a  tubal ligation many years ago.  She had a total abdominal hysterectomy and bilateral salpingo-oophorectomy due to excessive menstrual bleeding related to fibroids, also many years ago.  The patient tells me that 18 years ago she had a biopsy of a lymph node in her anterior neck region that was benign.  She underwent arthroscopy on her right knee on 05/28/2011.  No history of any injuries.  No history of blood transfusions.   ALLERGIES:  SHE IS APPARENTLY ALLERGIC TO LATEX WHICH CAUSES SWELLING AND A RASH.  LASIX MAKES HER FEEL WEAK BUT DOES NOT APPEAR TO BE A TRUE ALLERGY.   FAMILY HISTORY:  Father died at age 75 of pancreatic cancer.  Mother has dementia, age 16, and has been followed by me for breast cancer and a monoclonal gammopathy that seems to be benign.  A brother has had surgery for brain lesions, possibly tumors, also kidney cancer.  A sister has sarcoidosis.  The patient has 2 sons, ages 28 and 10.  Her oldest son has obesity, hypertension and diabetes.  No grandchildren.   SOCIAL HISTORY:  No use of tobacco products, alcohol or IV drugs.  The patient was born in [place].  She has 1 year of college.  She has been married 45 years to her husband Fayrene Fearing.  They live in [place]. She worked for [employer] for 19 years, and for [employer] for 10 years.  She retired in 2007.  She drives.  She is involved in taking care of her mother who is currently at home and has dementia.   REVIEW OF SYSTEMS:  The patient complains of some fatigue but no recent changes and really no sense of ill health or any recent changes in her sense of well-being.  She denies any neurologic problems.  She uses glasses for reading.  Vision and hearing are good.  She may have some sinus problems.  No history of hay fever.  She has some reflux.  No abdominal pain.  Weight has been stable.  No diarrhea, constipation or obvious blood in the stools.  She had a colonoscopy in 2007 by Dr. Karilyn Cota.   No history of any liver problems, jaundice or hepatitis.  No cardiac symptoms.  She does have some dyspnea on exertion in climbing stairs.  She had a mammogram in June 2011.  She also has breast MRIs carried out that are in the system.  No urinary symptoms.  She has some swelling of the legs.  No history of blood clots or intermittent claudication.  She bruises easily with trauma.  No undue bleeding.  No arthritis or musculoskeletal pains.  She has had arthroscopy of her right knee.  No fever.  She has had sweating at night for the past 4-5 years without change.  No skin rashes, pruritus or skin cancers.  No depression or emotional problems.   PHYSICAL EXAMINATION:  General:  Ms. Dumire is a generally healthy- appearing woman, somewhat obese, very pleasant.  Her weight is 185 pounds, 4.8 ounces, height 5 feet, 3-1/2 inches.  Body surface area 1.94 m2.  Blood pressure 153/66 in the left arm sitting.  Pulse 66 and regular, respirations 20 and unlabored.  HEENT:  The patient wears glasses.  No scleral icterus.  Pupillary and extraocular movements are normal.  Mouth and pharynx are benign.  Dentition is excellent.  Neck: The patient is still postsurgical, and in fact, there still is some swelling in her left neck which is somewhat indistinct.  There is firmness.  I cannot palpate individual nodes.  These changes could be postsurgical, also indicative of some adenopathy.  No posterior cervical, right cervical or right supraclavicular adenopathy.  No obvious thyroid enlargement or bruit.  Lungs:  Clear to percussion and auscultation.  Cardiac:  Regular rhythm without murmur or rub.  Back: No skeletal tenderness or deformity.  Lymphatics:  No axillary or inguinal adenopathy.  Breasts:  Not examined.  Abdomen:  Somewhat obese but benign with no organomegaly or masses palpable.  No tenderness. Extremities:  No pitting edema, clubbing, palmar erythema, petechiae, or purpura.  Neurologic:  Grossly  normal.   LABORATORY DATA:  Today white count 3.8, ANC 2.1 hemoglobin 12.2, hematocrit 35.5, and platelets 211,000.  Chemistries are entirely normal.  BUN 15, creatinine 0.99, albumin 4.8.  LDH 161.   IMPRESSION AND PLAN:  This patient has lymphadenopathy that has been biopsied several times in her left neck and left supraclavicular area. Interestingly, imaging studies, specifically MRI that go back to 2007, i.e., more than 5 years ago, suggested some lymphadenopathy.  The spleen has been normal.  In addition, the patient has an undiagnosed mass in her liver that seems to be slowly enlarging.  Etiology for these findings are unclear.  The biopsies that have been carried out do not seem to indicate a lymphoma or other malignant condition.  I should mention that the patient did have an anti-DNA antibody carried out that was less than 1, and complement C3 was normal.  ANA direct was negative.   I will go ahead and obtain CT scan of the chest, abdomen and pelvis with IV and p.o. contrast.  I will plan to review the  biopsy findings with the pathologist.  If the patient's lymphadenopathy appears to be generally stable and is asymptomatic and the patient at this time seems to be feeling well, then I am inclined to be very conservative. Certainly we could check HIV status, also check an ACE level to look for sarcoidosis.  Of greater concern is the slow-growing lesion in the liver.  This may be a low-grade hepatocellular carcinoma.  The patient may need a biopsy or perhaps another MRI or even a PET scan.   The patient was reassured.  We will go ahead and obtain the CT scans, and I will see Ms. Mcdonnell again in about 2 months at which time we will check CBC and chemistries.  We may want to add on some additional studies after I have had a chance to review the CT scans and the pathology.       ______________________________ Samul Dada, M.D. DSM/MEDQ  D:  07/27/2011  T:  07/27/2011   Job:  [id number]  Samul Dada, MD  07/27/2011  7:02 PM  Signed  This office note has been dictated.  #[id number]  Samul Dada, MD  08/08/2011  9:01 PM  Signed  CT scans of the chest, abdomen, and pelvis from 08/02/2011 were reviewed on this patient.  As noted in the report, there seems to be an enlarging left lower pole renal lesion, which did not appear to be cystic on the prior MRI of the abdomen that was carried out on 05/02/2010.  On  that scan, one can see the very subtle, early development of this lesion, which is clearly now bigger.  An enlarging left periaortic lymph node was also noted on the most recent CT scan.  The lesion in the medial lower pole of the left kidney measures 2.5 x 1.7 cm.  In retrospect, on the prior study, specifically the MRI, this was present and measuring 1.5 x 1.3 cm.  The left periaortic lymph node now has a short axis diameter of 1.5 cm compared with 1.2 cm.  The lesion in the liver appears to be enhancing.   In my review of the MRI of the abdomen carried out with and without contrast on 2010-05-17, there is a mass in the inferior right hepatic lobe, which measures 5.4 x 3.0 cm, showing no significant change since the prior study of 09/07/2008.  However, if one goes back to the MRI of 05/07/2006, this lesion measured 3.7 x 2.2 x 3.5 cm and, thus, has enlarged.  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  It does not appear to be a typical hemangioma.  It was suggested that to work this up, an Community education officer protocol with an MRI might be helpful in determining whether this is a low-grade hepatocellular carcinoma.   I also reviewed the CT scan of the neck with IV contrast carried out on 05/26/2011.       ______________________________ Samul Dada, M.D. DSM/MEDQ  D:  08/07/2011  T:  08/08/2011  Job:  [id number]  Samul Dada, MD  08/21/2011 11:09 PM  Signed  I reviewed the MRI of the abdomen with and without  IV contrast carried out on [place].  The study was done on December 5th and compared with prior studies, specifically the MRI of 2010/05/17.  Particular attention was focused on the lesion in the medial aspect of the lower pole left kidney which measured 18 x 17 mm and demonstrated mild enhancement.  This lesion was apparently present on an MRI carried out on May 17, 2010, was very subtle and looked like a cortical bulge and measured 14 x 12 mm.  Thus the lesion has increased in size.  It was suggested that this lesion could be a renal cell carcinoma or possibly a non-Hodgkin's lymphoma.  It was suggested by the radiologist that a core needle biopsy be obtained.   There was also an atypical lesion within the inferior right hepatic lobe measuring 5.2 x 2.9 cm, comparable to the size on the prior MRI of May 17, 2010.  The lesion is hyperintense on T2 weighted imaging and restricts diffusion.  The lesion is hypodense on T1 weighted images and demonstrates minimal enhancement.  Findings were not felt to be typical for hemangioma or focal nodular hyperplasia.  It was suggested that this patient have an Eov[facility] MRI study in approximately 6 months,  which would be somewhere around the middle of the year 2013.   We will go ahead and I have ordered a core needle biopsy of the mass in the left kidney.       ______________________________ Samul Dada, M.D. DSM/MEDQ  D:  08/20/2011  T:  08/21/2011  Job:  [id number]         Not recorded  Transcription    Chief Complaint: left renal lesion seen on MRI of uncertain etiology. HPI: See H&P from oncology note.   Past Medical History  Diagnosis Date  . CAD (coronary artery disease)   . GERD (gastroesophageal reflux disease)   . HTN (  hypertension)   . Hyperlipidemia   . Enlargement of lymph nodes 07/26/2011    Past Surgical History  Procedure Date  . Mr breast bilateral cyst removal  . Abdominal hysterectomy   . Lymph node dissection  x 3 different times  . Tubal ligation   . Knee arthroscopy w/ debridement 05/2011    right    Family History  Problem Relation Age of Onset  . Cancer    . Stroke    . Heart disease    . Coronary artery disease     Social History:  reports that she has never smoked. She does not have any smokeless tobacco history on file. She reports that she does not drink alcohol or use illicit drugs.  Allergies:  Allergies  Allergen Reactions  . Furosemide Other (See Comments)    Makes her feel very weak  . Latex Swelling and Rash    Medications Prior to Admission  Medication Sig Dispense Refill  . aspirin 81 MG tablet Take 81 mg by mouth every evening.       Marland Kitchen ibuprofen (ADVIL,MOTRIN) 800 MG tablet Take 800 mg by mouth 2 (two) times daily.       . Calcium-Magnesium-Zinc (CAL-MAG-ZINC PO) Take 1 tablet by mouth 2 (two) times daily.        . lansoprazole (PREVACID) 30 MG capsule Take 30 mg by mouth every morning.       . loratadine (CLARITIN) 10 MG tablet Take 10 mg by mouth daily before breakfast.       . losartan-hydrochlorothiazide (HYZAAR) 100-12.5 MG per tablet Take 1 tablet by mouth every evening.       . Multiple Vitamin (MULITIVITAMIN WITH MINERALS) TABS Take 1 tablet by mouth every morning.        . Omega-3 Fatty Acids (FISH OIL) 1200 MG CAPS Take 1 capsule by mouth 2 (two) times daily.        . potassium chloride SA (K-DUR,KLOR-CON) 20 MEQ tablet Take 20 mEq by mouth daily.        . Probiotic Product (ALIGN PO) Take 1 capsule by mouth daily as needed. For stomach discomfort       . pyridOXINE (VITAMIN B-6) 100 MG tablet Take 100 mg by mouth every morning.        . Red Yeast Rice 600 MG CAPS Take 1 capsule by mouth every evening.        Medications Prior to Admission  Medication Dose Route Frequency Provider Last Rate Last Dose  . 0.9 %  sodium chloride infusion   Intravenous Continuous D Jeananne Rama, PA      . methylPREDNISolone acetate (DEPO-MEDROL) injection 40 mg  40 mg  Intra-articular Once Fuller Canada, MD         Review of Systems  Constitutional: Positive for malaise/fatigue.  HENT: Negative.   Eyes: Negative.   Respiratory: Negative.   Cardiovascular: Negative.   Gastrointestinal: Positive for heartburn.  Genitourinary: Negative.   Skin: Negative.   Neurological: Negative.   Endo/Heme/Allergies: Negative.   Psychiatric/Behavioral: Negative.     Physical Exam  Constitutional: She is oriented to person, place, and time. She appears well-developed and well-nourished. No distress.  HENT:  Head: Normocephalic and atraumatic.  Cardiovascular: Normal rate, regular rhythm and normal heart sounds.  Exam reveals no gallop and no friction rub.   No murmur heard. Respiratory: Effort normal and breath sounds normal. No respiratory distress. She has no wheezes.  GI: Soft. Bowel sounds are normal.  Musculoskeletal:  Normal range of motion. She exhibits no edema.  Lymphadenopathy:    She has cervical adenopathy.  Neurological: She is alert and oriented to person, place, and time.  Skin: Skin is warm and dry.  Psychiatric: She has a normal mood and affect. Her behavior is normal. Judgment and thought content normal.     Assessment/Plan Left renal lesion for biopsy under US guidance to obtain hopeful confirmatory diagnosis.  Procedure details have been discussed with patient in detail.  Potential complications including but not limited to infection, bleeding, organ damage, renal insufficiency and complications with moderate sedation discussed with her apparent understanding.  Written consent obtained.  Will proceed with biopsy of labs are WNL.   Shardea Cwynar D 09/20/2011, 12:29 PM

## 2011-09-20 NOTE — ED Notes (Signed)
Patient is resting comfortably. 

## 2011-09-20 NOTE — Discharge Instructions (Signed)
·   Do not drive today, drink alcoholic beverages, or visit public places  Somebody is to remain with you for the rest of the night   SEEK MEDICAL CARE IF: call radiology at [phone] or Dr Arline Asp at [phone]  You have bloody urine more than 24 hours after the test.   You have a fever.   You feel faint or dizzy .   You cannot urinate .   You have worsening pain in the biopsy.

## 2011-09-20 NOTE — Procedures (Signed)
Procedure:  Ultrasound guided core biopsy of left renal mass Findings:  Hypoechoic mass.  18 G core bx x 4 via 17 G needle.  Submitted in saline and formalin. No complications. Plan:  3 hr recovery

## 2011-09-20 NOTE — Progress Notes (Signed)
VAS team called

## 2011-09-25 ENCOUNTER — Telehealth: Payer: Self-pay | Admitting: Oncology

## 2011-09-25 ENCOUNTER — Other Ambulatory Visit (HOSPITAL_COMMUNITY): Payer: Self-pay | Admitting: Oncology

## 2011-09-25 ENCOUNTER — Ambulatory Visit (HOSPITAL_BASED_OUTPATIENT_CLINIC_OR_DEPARTMENT_OTHER): Payer: Medicare Other | Admitting: Oncology

## 2011-09-25 ENCOUNTER — Ambulatory Visit: Payer: Medicare Other

## 2011-09-25 VITALS — BP 151/92 | HR 73 | Temp 97.4°F | Ht 62.0 in | Wt 184.5 lb

## 2011-09-25 DIAGNOSIS — D376 Neoplasm of uncertain behavior of liver, gallbladder and bile ducts: Secondary | ICD-10-CM | POA: Diagnosis not present

## 2011-09-25 DIAGNOSIS — Z8572 Personal history of non-Hodgkin lymphomas: Secondary | ICD-10-CM | POA: Insufficient documentation

## 2011-09-25 DIAGNOSIS — K219 Gastro-esophageal reflux disease without esophagitis: Secondary | ICD-10-CM

## 2011-09-25 DIAGNOSIS — C8589 Other specified types of non-Hodgkin lymphoma, extranodal and solid organ sites: Secondary | ICD-10-CM

## 2011-09-25 DIAGNOSIS — I10 Essential (primary) hypertension: Secondary | ICD-10-CM | POA: Diagnosis not present

## 2011-09-25 DIAGNOSIS — K769 Liver disease, unspecified: Secondary | ICD-10-CM | POA: Diagnosis not present

## 2011-09-25 DIAGNOSIS — E785 Hyperlipidemia, unspecified: Secondary | ICD-10-CM

## 2011-09-25 DIAGNOSIS — E669 Obesity, unspecified: Secondary | ICD-10-CM

## 2011-09-25 DIAGNOSIS — C8239 Follicular lymphoma grade IIIa, extranodal and solid organ sites: Secondary | ICD-10-CM

## 2011-09-25 DIAGNOSIS — R599 Enlarged lymph nodes, unspecified: Secondary | ICD-10-CM

## 2011-09-25 DIAGNOSIS — C8590 Non-Hodgkin lymphoma, unspecified, unspecified site: Secondary | ICD-10-CM

## 2011-09-25 DIAGNOSIS — R160 Hepatomegaly, not elsewhere classified: Secondary | ICD-10-CM

## 2011-09-25 HISTORY — DX: Follicular lymphoma grade IIIa, extranodal and solid organ sites: C82.39

## 2011-09-25 HISTORY — DX: Personal history of non-Hodgkin lymphomas: Z85.72

## 2011-09-25 LAB — CBC WITH DIFFERENTIAL/PLATELET
Basophils Absolute: 0 10*3/uL (ref 0.0–0.1)
EOS%: 2.7 % (ref 0.0–7.0)
HCT: 34.2 % — ABNORMAL LOW (ref 34.8–46.6)
HGB: 11.8 g/dL (ref 11.6–15.9)
LYMPH%: 24.1 % (ref 14.0–49.7)
MCH: 28.4 pg (ref 25.1–34.0)
MCV: 82.2 fL (ref 79.5–101.0)
MONO%: 7.7 % (ref 0.0–14.0)
NEUT%: 65.1 % (ref 38.4–76.8)
Platelets: 252 10*3/uL (ref 145–400)
lymph#: 1.1 10*3/uL (ref 0.9–3.3)

## 2011-09-25 LAB — COMPREHENSIVE METABOLIC PANEL
AST: 17 U/L (ref 0–37)
BUN: 17 mg/dL (ref 6–23)
Calcium: 9.7 mg/dL (ref 8.4–10.5)
Chloride: 103 mEq/L (ref 96–112)
Creatinine, Ser: 0.97 mg/dL (ref 0.50–1.10)
Total Bilirubin: 0.4 mg/dL (ref 0.3–1.2)

## 2011-09-25 NOTE — Progress Notes (Signed)
CC:   Joyce Beam, MD [name]. Joyce Chance, MD  HISTORY:  I am seeing [name] today for the 1st time since her initial visit here on 07/27/2011.  She is accompanied by her husband. It will be recalled that [name] is a 66 year old African American female who I was asked to see regarding lymphadenopathy, which she had undergone a couple of biopsies without a definitive diagnosis.  During our evaluation, we came across the studies that demonstrated a mass in the inferior aspect of the right lobe of the liver.  Apparently, this mass was 1st noted on an ultrasound of 04/18/2005 and an MRI from 05/07/2006.  There was suggestion that this mass may have been gradually increasing, although there does not appear to be any recent increase in this mass as evidenced from an MRI of the abdomen that we did with and without contrast from 08/16/2011, which was compared to an MRI from 01/30/2010.  A 3rd issue had to do with the discovery of a lesion in the lower pole of the left kidney.  This was apparently evident on the MRI from 05/02/2010, but clearly this lesion has increased in size as evidenced from the CT scan of the chest, abdomen, and pelvis on 08/02/2011.  That enlargement was also confirmed on the MRI from 08/16/2011.  The patient had CT scans of the chest, abdomen, and pelvis on 08/02/2011, and there was a 2.5 x 1.7-cm low-density area within the medial lower pole of the left kidney.  Again, it was noted that on the MRI of the abdomen from 05/02/2010 that this lesion was present and measured 1.5 x 1.3 cm.  With regard to the patient's lymphadenopathy, I had noted that imaging studies, specifically an MRI that went back to 2007, had made mention of lymphadenopathy.  I believe the date for that MRI was 05/07/2006.  Because of the enlargement of the left renal lesion, we arranged for a core needle biopsy of the lesion in the left kidney on 09/29/2010.  I have spoken with Dr. Guerry Bruin,  pathologist, about the biopsy which shows non-Hodgkin B-cell lymphoma.  He felt this is low-grade and favored just being a follicular lymphoma.  The B-cell population was positive for CD79a, CD20, and CD10, but negative cyclin D1.  Dr. 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  He thought that this was a low-grade process.  The patient is here today with her husband, [name].  The patient really does not have any major new issues.  She thinks that there is a lymph node near her left ear that has developed and is somewhat tender.  She thinks that she has a little less energy and less stamina, but she is apparently not sleeping very well.  She denies any fever but has had some night sweats for a number of years and denies any change in that.  PROBLEM LIST: 1. 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  In retrospect, I     suspect that this patient has had low-grade lymphoma dating back to     2007. With kidney involvement she would now be stage IV.  At this     point, I am not sure that she is having any symptoms from her  lymphoma. 2. Liver mass involving the inferior right hepatic lobe, present since     an MRI carried out on 05/07/2006 and an ultrasound from 04/18/2005.     Etiology unclear.  It was suggested that if a future MRI is     planned, a hepatocyte specific imaging agent (EOVIST) may help with     the further characterization of this lesion. 3. Obesity. 4. Hypertension. 5. Dyslipidemia. 6. GERD. 7. Colonic diverticulosis. 8. 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 9. Status post right knee arthroplasty on 05/28/2011 by Dr. Lequita Halt.  MEDICATIONS: 1.  Aspirin 81 mg daily. 2. Calcium. 3. Magnesium. 4. Zinc 2 daily. 5. Advil 800 mg twice daily. 6. Prevacid 30 mg daily. 7. Claritin 10 mg daily. 8. Hyzaar 100/12.5 mg 1 tab daily. 9. Multivitamins one daily. 10.Omega-3 fatty acids 1200 mg 1 twice a day. 11.K-Dur 20 mEq daily. 12.Probiotic 1 capsule daily. 13.Vitamin B6 100 mg daily. 14.Red yeast rice 600 mg daily.  PHYSICAL EXAM:  General: The patient is a pleasant in no acute distress. Vital signs: Weight is 184.5 pounds, height 5 feet 2 inches, and body surface area 1.91 m squared.  Blood pressure 151/92.  Other vital signs are normal.  HEENT: There is no scleral icterus.  Mouth and pharynx are benign.  Just below the left ear is a somewhat tender, approximately 1- cm lymph node, that can be easily felt.  There is some firmness in the left supraclavicular area where the patient had a biopsy.  The patient also had a biopsy just below her left ear.  I am unable to appreciate any other adenopathy in the neck, supraclavicular, axillary, or inguinal areas.  Heart and lungs are normal.  Breasts are not examined.  Abdomen: Obese, nontender, with no organomegaly or masses palpable.  No obvious inguinal adenopathy.  Extremities:  Right leg is a little puffy.  The patient attributes this to her arthroscopic surgery.  Neurologic exam is grossly normal.  LABORATORY DATA:  Today, white count 4.7, ANC 3.1, hemoglobin 11.8, hematocrit 34.2, and platelets 252,000.  Chemistries today were entirely normal including an LDH of 190 and an albumin of 4.2.  IMAGING STUDIES: 1. 08/02/2011, CT scan of the chest, abdomen, and pelvis with and     without IV contrast.  There was borderline right axillary     lymphadenopathy.  There was scattered subpleural bilateral     pulmonary nodules measuring up to 5 mm.  There was an enlarging     left lower pole renal lesion which did not appear to be cystic on     the prior MRI and was felt to be of concern for  an enlarging renal     cell carcinoma.  There was enlarging left periaortic lymph node.     There was nonspecific anterior right hepatic lesion which is felt     to be stable in size.  There was stable mesenteric adenopathy and     mesenteric haziness/stranding.  There was descending colonic and     sigmoid diverticulosis. 2. On 08/16/2011, there was an MRI of the abdomen with and without IV     contrast.  There was a stable indeterminant lesion within the     inferior right hepatic lobe.  A future MRI is planned, a hepatocyte     specific imaging agent (EOVIST) may help characterization. There     was interval enlargement of a minimally enhancing lesion extending  from the medial left kidney with restricted diffusion.  Renal     neoplasm was a diagnosis of exclusion.  There was stable central     mesenteric lymphadenopathy. 3. On 09/20/2011, there was an ultrasound-guided core needle biopsy of     the left renal mass.  IMPRESSION AND PLAN:  Today's session was quite lengthy, lasting about an hour with greater than 50% of our time spent in discussion.  I believe that Mrs. Pflug has a low-grade B-cell lymphoma, felt to be a little follicular lymphoma, with evidence of mesenteric lymphadenopathy extending back to 2007, now with an enlarging lesion in the lower pole of the left kidney which was biopsied on 09/30/2011.  At this time, I believe the patient is asymptomatic and her disease is minimal.  I do not see any clear-cut indication for treatment at this time.  We had a very lengthy discussion about the natural history of low-grade, indolent lymphomas.  It is my plan to try to avoid treatment or at least postpone treatment as long as possible.  We spent a fair amount of time discussing indications for treatment.  The patient understands that should she develop symptoms from her lymphoma, and certainly if there is evidence of progressive disease, that she will require treatment.   For now, the only lesion that appears to be significantly increasing in size is the lesion in the left kidney, and that lesion measures 2.5 x 1.7 cm and thus is not terribly large at this time.  The patient understands that low-grade lymphomas are not likely to be cured by any available treatment, and many of them do not require treatment at all.  Treatment usually results in good responses that may last months or even years, but, unfortunately, these tumors tend to recur.  If the patient has liver lesion of uncertain etiology, now measuring 5.1 x 3.6 cm on the recent CT scans of 08/02/2011.  It is our plan to go ahead with an MRI sometime in the next several months as recommended, using EOVIST as the contrast agent.  We will plan to see Mrs. Kenan again in approximately 2 months.  At that time, we will check CBC, chemistries, including an LDH.    ______________________________ Samul Dada, M.D. DSM/MEDQ  D:  09/25/2011  T:  09/25/2011  Job:  [id number]

## 2011-09-25 NOTE — Telephone Encounter (Signed)
appt made and printed for 3/12 aom °

## 2011-09-25 NOTE — Progress Notes (Signed)
This office note has been dictated.  #[id number]

## 2011-09-26 ENCOUNTER — Other Ambulatory Visit: Payer: Self-pay

## 2011-09-26 ENCOUNTER — Encounter: Payer: Self-pay | Admitting: Oncology

## 2011-09-26 DIAGNOSIS — D376 Neoplasm of uncertain behavior of liver, gallbladder and bile ducts: Secondary | ICD-10-CM | POA: Insufficient documentation

## 2011-09-29 ENCOUNTER — Ambulatory Visit: Payer: Medicare Other | Admitting: Oncology

## 2011-09-29 ENCOUNTER — Other Ambulatory Visit: Payer: Medicare Other | Admitting: Lab

## 2011-10-03 ENCOUNTER — Other Ambulatory Visit (HOSPITAL_COMMUNITY): Payer: Self-pay | Admitting: Oncology

## 2011-10-03 DIAGNOSIS — Z139 Encounter for screening, unspecified: Secondary | ICD-10-CM

## 2011-10-05 ENCOUNTER — Ambulatory Visit (HOSPITAL_COMMUNITY)
Admission: RE | Admit: 2011-10-05 | Discharge: 2011-10-05 | Disposition: A | Discharge status: Home / Self Care | Payer: Medicare Other | Source: Ambulatory Visit | Attending: Oncology | Admitting: Oncology

## 2011-10-05 DIAGNOSIS — Z1231 Encounter for screening mammogram for malignant neoplasm of breast: Secondary | ICD-10-CM | POA: Insufficient documentation

## 2011-10-05 DIAGNOSIS — Z139 Encounter for screening, unspecified: Secondary | ICD-10-CM

## 2011-10-11 ENCOUNTER — Other Ambulatory Visit (HOSPITAL_COMMUNITY): Payer: Self-pay | Admitting: Oncology

## 2011-10-11 DIAGNOSIS — R928 Other abnormal and inconclusive findings on diagnostic imaging of breast: Secondary | ICD-10-CM

## 2011-10-23 ENCOUNTER — Encounter (HOSPITAL_COMMUNITY): Payer: Self-pay | Admitting: Oncology

## 2011-10-23 ENCOUNTER — Encounter (HOSPITAL_COMMUNITY): Payer: Medicare Other | Attending: Oncology | Admitting: Oncology

## 2011-10-23 VITALS — BP 174/83 | HR 85 | Temp 98.1°F | Ht 63.75 in | Wt 186.4 lb

## 2011-10-23 DIAGNOSIS — C8589 Other specified types of non-Hodgkin lymphoma, extranodal and solid organ sites: Secondary | ICD-10-CM | POA: Insufficient documentation

## 2011-10-23 DIAGNOSIS — I10 Essential (primary) hypertension: Secondary | ICD-10-CM | POA: Diagnosis not present

## 2011-10-23 DIAGNOSIS — K769 Liver disease, unspecified: Secondary | ICD-10-CM | POA: Diagnosis not present

## 2011-10-23 DIAGNOSIS — Z6832 Body mass index (BMI) 32.0-32.9, adult: Secondary | ICD-10-CM | POA: Insufficient documentation

## 2011-10-23 DIAGNOSIS — E669 Obesity, unspecified: Secondary | ICD-10-CM | POA: Insufficient documentation

## 2011-10-23 DIAGNOSIS — Z91040 Latex allergy status: Secondary | ICD-10-CM | POA: Diagnosis not present

## 2011-10-23 DIAGNOSIS — M1710 Unilateral primary osteoarthritis, unspecified knee: Secondary | ICD-10-CM | POA: Diagnosis not present

## 2011-10-23 DIAGNOSIS — K219 Gastro-esophageal reflux disease without esophagitis: Secondary | ICD-10-CM | POA: Insufficient documentation

## 2011-10-23 DIAGNOSIS — C8590 Non-Hodgkin lymphoma, unspecified, unspecified site: Secondary | ICD-10-CM

## 2011-10-23 DIAGNOSIS — Q245 Malformation of coronary vessels: Secondary | ICD-10-CM | POA: Insufficient documentation

## 2011-10-23 LAB — CBC
Hemoglobin: 12.5 g/dL (ref 12.0–15.0)
MCH: 28.4 pg (ref 26.0–34.0)
MCV: 81.4 fL (ref 78.0–100.0)
RBC: 4.4 MIL/uL (ref 3.87–5.11)

## 2011-10-23 LAB — DIFFERENTIAL
Basophils Absolute: 0.1 10*3/uL (ref 0.0–0.1)
Lymphocytes Relative: 28 % (ref 12–46)
Monocytes Absolute: 0.4 10*3/uL (ref 0.1–1.0)
Neutro Abs: 2.1 10*3/uL (ref 1.7–7.7)

## 2011-10-23 LAB — COMPREHENSIVE METABOLIC PANEL
AST: 21 U/L (ref 0–37)
Albumin: 4.4 g/dL (ref 3.5–5.2)
Alkaline Phosphatase: 76 U/L (ref 39–117)
BUN: 21 mg/dL (ref 6–23)
Chloride: 104 mEq/L (ref 96–112)
Potassium: 3.9 mEq/L (ref 3.5–5.1)
Total Bilirubin: 0.4 mg/dL (ref 0.3–1.2)

## 2011-10-23 LAB — LACTATE DEHYDROGENASE: LDH: 179 U/L (ref 94–250)

## 2011-10-23 NOTE — Progress Notes (Signed)
This office note has been dictated.

## 2011-10-23 NOTE — Patient Instructions (Signed)
[name]  [id number] [birth year]   [facility]  Discharge Instructions  RECOMMENDATIONS MADE BY THE CONSULTANT AND ANY TEST RESULTS WILL BE SENT TO YOUR REFERRING DOCTOR.   EXAM FINDINGS BY MD TODAY AND SIGNS AND SYMPTOMS TO REPORT TO CLINIC OR PRIMARY MD: You have a low-grade lymphoma and we would not treat you unless you are symptomatic, ie Abnormal labs, pain, soaking sweats pain, 20% weight loss, etc.  Want to check some labs today and get you scheduled for a PET Scan for staging purposes.  MEDICATIONS PRESCRIBED: none   INSTRUCTIONS GIVEN AND DISCUSSED: Other :  Report fevers, soaking sweats, frequent infections, weight loss, pain etc.  SPECIAL INSTRUCTIONS/FOLLOW-UP: Xray Studies Needed:  PET Scan and Return to Clinic after PET Scan.   I acknowledge that I have been informed and understand all the instructions given to me and received a copy. I do not have any more questions at this time, but understand that I may call the Specialty Clinic at [facility] at [phone] during business hours should I have any further questions or need assistance in obtaining follow-up care.    __________________________________________  _____________  __________ Signature of Patient or Authorized Representative            Date                   Time    __________________________________________ Nurse's Signature

## 2011-10-24 NOTE — Progress Notes (Signed)
CC:   [name]. Loleta Chance, MD Melvenia Beam, MD  DIAGNOSES: 1. Low-grade non-Hodgkin lymphoma, consistent with a CD20 positive B-     cell follicular lymphoma of low-grade potential, status post lymph     node biopsy from the left neck, left supraclavicular fossa, and     then a kidney biopsy on 09/20/2011.  She has no B symptomatology, a     normal LDH,  normal CBC, and is asymptomatic. 2. Allergy to latex which causes a rash and swelling. 3. Lasix makes her weak. 4. Obesity, weighing 186 pounds on a 5 foot 3-1/2 inch frame with a     BMI of 32.3. 5. History of hypertension. 6. History of right knee arthritis with arthroscopic surgery by Dr.     Lequita Halt in the past. 7. Liver mass, possibly present times several years. 8. Coronary artery anomaly that is congenital, she states, picked up     by Dr. Glennon Hamilton many years ago, not in need of therapy. 9. History of gastroesophageal reflux disease. 10.Multiple breast biopsies.  HISTORY:  This is a very, very pleasant 66 year old, African American woman who is accompanied by her husband today who was found to have some lymph nodes that she felt were enlarged in the left neck area.  She saw Dr. Emeline Darling about this who did a lymph node biopsy on 06/26/2011, from the left supraclavicular fossa, which showed atypical lymphoid proliferation, suggestive but not diagnostic of possible malignancy. She then had consultation with Dr. Johney Frame, which lead to eventual biopsy of her left renal mass, and that revealed a definite diagnosis this time of non-Hodgkin lymphoma, CD20 positive, low-grade type consistent with follicular lymphoma.  I do not have the original date of her 1st biopsy from the left cervical chain.  There is a note stating it was from 05/22/2011, but I cannot find a pathology report in the [facility] computer system.  REVIEW OF SYSTEMS:  She does not have fevers, night sweats.  She gets hot at night many times, but this is not a true  night sweat.  She has not lost weight in the last 6 months to a year.  She has not had recorded fevers.  She feels weak and tired and a little down, but she has been taking care of her mother who is now bed ridden for the last year and a half with multiple medical problems.  Maye has had to give up her exercise program.  She has not been able to do her physical therapy after her knee surgery this past fall, and so it has been a very busy year and a half for her.  She has not been able to attend to herself on a regular full-fledged basis.  PAST MEDICAL HISTORY:  As mentioned above.  She did have a cardiac catheterization in 2007.  She also has a history GERD.  She has also had multiple breast biopsies, all which were benign.  FAMILY HISTORY:  Father died at age 32 of pancreatic cancer.  Mother is also, again, debilitated age 67 with some dementia, and also has history of breast cancer and an MGUS, according Dr. Mamie Levers note.  She has a sister with sarcoidosis.  That is her only sister.  She has 3 brothers.  One of whom had a history of a kidney cancer.  She herself has 2 sons, ages 38 and 58, in good health, except for the 1 son is overweight, has hypertension, and early diabetes it sounds like.  She does not use alcohol, tobacco products, or IV drugs.  Worked at [employer] after initially working for [employer] for a number of years.  She retired in 2007.  PHYSICAL EXAMINATION:  General: A very pleasant, but somewhat anxious lady in no acute distress.  Vital signs: Her weight is 186 pounds, height 5 feet 3 inches, BMI 32.3, blood pressure 174/83 in the right arm sitting position, pulse 80 and regular, respirations 16-18 and unlabored at rest.  She is afebrile and denies any pain.  Lymph: She has no obvious lymphadenopathy in the inguinal areas.  She has no obvious axillary nodes.  No epitrochlear nodes.  She has no supraclavicular or infraclavicular nodes presently. There  is a scar in the supraclavicular fossa on the left which is well-healed.  Small scar on the left neck close to the ear. She has a preauricular node, which is 1 x 1 cm, and she has a submandibular node, more laterally, which is about 1/2 x 1 cm on the left only.  There are no nodes on the right.  She has no hepatosplenomegaly.  Her lungs are clear to auscultation percussion. Heart shows a regular rhythm and rate.  Breast exam is negative for masses, only scars are present.  She has no peripheral edema.  She is alert and oriented.  Teeth are in good repair.  Pupils are equally round and reactive to light.  Facial symmetry is intact.  Indra, I think, needs more reassurance than anything.  Right now, she does not need any chemotherapy for this disorder since she is asymptomatic.  She may well have stage IV disease, which it sounds like with extra lymph node involvement of the kidney.  She also has a liver mass seen on previous MRIs, and Dr. Arline Asp was going to do a followup scan with EOVIST.  What I suggested to [facility], is let us stage her with a PET scan to look at disease above and below the diaphragm, see her back, and just go over things. But, again, I have reassured her that right now she does not need therapy for low-grade CD20 positive B cell lymphoma.  It is not typically curable but very treatable and treatable only when it should be treated, and that is when she has symptoms or signs of pain or disease enlargement that is out of the ordinary, or anemia, thrombocytopenia, etc.  So, I would like to do a PET scan, see if not only the kidney lights up but the liver, see if that lights up, any lymph nodes, and then go over things with her.  I have tried to reassure her that Dr. Arline Asp has done a very thorough workup, and that she can return to him if she so desires or stay with me. And if she would like a 3rd opinion we are happy to arrange it, because peace of mind for her I  think is the most important thing at this time since we are probably not going to be treating her at this juncture.  She liked our discussion. She will see me after the PET scan.  We will repeat a few blood tests and then decide what to do after we see her next.    ______________________________ Ladona Horns. Mariel Sleet, MD ESN/MEDQ  D:  10/23/2011  T:  10/23/2011  Job:  [id number]

## 2011-10-25 ENCOUNTER — Ambulatory Visit (HOSPITAL_COMMUNITY)
Admission: RE | Admit: 2011-10-25 | Discharge: 2011-10-25 | Disposition: A | Discharge status: Home / Self Care | Payer: Medicare Other | Source: Ambulatory Visit | Attending: Oncology | Admitting: Oncology

## 2011-10-25 DIAGNOSIS — R928 Other abnormal and inconclusive findings on diagnostic imaging of breast: Secondary | ICD-10-CM | POA: Diagnosis not present

## 2011-11-02 ENCOUNTER — Encounter (HOSPITAL_COMMUNITY)
Admission: RE | Admit: 2011-11-02 | Discharge: 2011-11-02 | Disposition: A | Discharge status: Home / Self Care | Payer: Medicare Other | Source: Ambulatory Visit | Attending: Oncology | Admitting: Oncology

## 2011-11-02 DIAGNOSIS — C8589 Other specified types of non-Hodgkin lymphoma, extranodal and solid organ sites: Secondary | ICD-10-CM | POA: Diagnosis not present

## 2011-11-02 DIAGNOSIS — C8590 Non-Hodgkin lymphoma, unspecified, unspecified site: Secondary | ICD-10-CM

## 2011-11-02 MED ORDER — FLUDEOXYGLUCOSE F - 18 (FDG) INJECTION: 18.8000 | Freq: Once | INTRAVENOUS | Status: AC | PRN

## 2011-11-08 ENCOUNTER — Encounter (HOSPITAL_COMMUNITY): Payer: Self-pay | Admitting: Oncology

## 2011-11-08 ENCOUNTER — Encounter (HOSPITAL_BASED_OUTPATIENT_CLINIC_OR_DEPARTMENT_OTHER): Payer: Medicare Other | Admitting: Oncology

## 2011-11-08 VITALS — BP 140/83 | HR 76 | Temp 98.0°F | Wt 185.5 lb

## 2011-11-08 DIAGNOSIS — C8581 Other specified types of non-Hodgkin lymphoma, lymph nodes of head, face, and neck: Secondary | ICD-10-CM

## 2011-11-08 DIAGNOSIS — C8590 Non-Hodgkin lymphoma, unspecified, unspecified site: Secondary | ICD-10-CM

## 2011-11-08 DIAGNOSIS — I10 Essential (primary) hypertension: Secondary | ICD-10-CM | POA: Diagnosis not present

## 2011-11-08 DIAGNOSIS — Z91040 Latex allergy status: Secondary | ICD-10-CM

## 2011-11-08 NOTE — Progress Notes (Signed)
This office note has been dictated.

## 2011-11-08 NOTE — Patient Instructions (Signed)
[name]  [id number] 01/27/1946   [facility]  Discharge Instructions  RECOMMENDATIONS MADE BY THE CONSULTANT AND ANY TEST RESULTS WILL BE SENT TO YOUR REFERRING DOCTOR.   EXAM FINDINGS BY MD TODAY AND SIGNS AND SYMPTOMS TO REPORT TO CLINIC OR PRIMARY MD: You have a stage IVA low grade lymphoma that we don't need to treat right now.  Symptoms that you need to report are:  Soaking night sweats, weight loss for no reason, pain that does not go away, any swelling that is unusual or does not go away and frequent recurring infections.  MEDICATIONS PRESCRIBED: none   INSTRUCTIONS GIVEN AND DISCUSSED:   SPECIAL INSTRUCTIONS/FOLLOW-UP: Lab work Needed in 8 weeks and Return to Clinic after labs in 8 weeks.   I acknowledge that I have been informed and understand all the instructions given to me and received a copy. I do not have any more questions at this time, but understand that I may call the Specialty Clinic at [facility] at [phone] during business hours should I have any further questions or need assistance in obtaining follow-up care.    __________________________________________  _____________  __________ Signature of Patient or Authorized Representative            Date                   Time    __________________________________________ Nurse's Signature

## 2011-11-08 NOTE — Progress Notes (Signed)
CC:   Annia Friendly. Loleta Chance, MD Melvenia Beam, MD  DIAGNOSES: 1. Low-grade non-Hodgkin's lymphoma consistent with a CD 20 positive B-     cell follicular lymphoma of low-grade potential status post lymph     node biopsy from the left neck, left supraclavicular fossa and then     a kidney biopsy on 09/19/2001, still without B symptomatology and a     normal CBC, normal LDH. 2. Allergy to latex which causes a rash and swelling. 3. Lasix use makes her weak. 4. Obesity. 5. Hypertension. 6. Right knee arthritis with arthroscopic surgery by Dr. Lequita Halt in     the past. 7. Coronary artery anomaly that is congenital she states picked up by     Dr. Glennon Hamilton years ago, not in need of therapy. 8. Gastroesophageal reflux disease. 9. Multiple breast biopsies.  [place] is here today with her husband to go over the results of the PET scan.  I reviewed them myself with Dr. Myles Rosenthal before going in to see her.  She does have disease not only in the kidney but also in the liver, it appears multiple lymph nodes including 2 lymph nodes at the base of the left breast close to the rib cage, right axilla.  She may have nodes at the base of the spine, mesentery, etc.  She still remains asymptomatic.  After reviewing the films with her and her husband, she had a host of questions.  What I tried to reaffirm is that since she does not have obvious symptoms from this other than a little tenderness of the left parotid gland/neck nodes when she lays down on her pillow or when she touches this area, that is all that she has to go on as far as the need for therapy and that is rather weak right now.  I would not treat her with just those symptoms.  She is of course anxious over this diagnosis which is normal, but I do not think we need to just jump on the band wagon and treat her presently.  I think the reasons to treat her are really uncontrolled pain that is not going away or getting worse, swelling from lymphatic  obstruction of the arms, legs, neck, etc., B symptomatology such as fevers, night sweats, weight loss, or loss of appetite.  If her blood counts also change for the worse, I think that is also an indication for her.  She is willing to just come back and see me in followup.  We will see her in 8 weeks this time to see how she is getting along, sooner we will see her if she changes and has any new problems related to this.  Otherwise we will just watch her for right now and hope that she goes many years without the need for interventional therapy.    ______________________________ Ladona Horns. Mariel Sleet, MD ESN/MEDQ  D:  11/08/2011  T:  11/08/2011  Job:  [id number]

## 2011-11-14 ENCOUNTER — Telehealth: Payer: Self-pay | Admitting: Medical Oncology

## 2011-11-14 NOTE — Telephone Encounter (Signed)
Pt called 11/10/11 and left a message that she wanted to cancel her appointment with Dr. Arline Asp 11/21/11. I left her a message to call me so we can discuss.

## 2011-11-14 NOTE — Telephone Encounter (Signed)
I called pt back and left a message that I see where she has seen Dr. Mariel Sleet. She had called and left a message that she wanted to cancel her appointment When I called her this am I was not aware she had seen Dr. Mariel Sleet and has follow up. I just needed to know she was being followed. I let her know if she needs to come back here for any reason to give [place] a call.

## 2011-11-20 ENCOUNTER — Encounter: Payer: Self-pay | Admitting: Psychiatry

## 2011-11-21 ENCOUNTER — Other Ambulatory Visit: Payer: Medicare Other | Admitting: Lab

## 2011-11-21 ENCOUNTER — Ambulatory Visit: Payer: Medicare Other | Admitting: Oncology

## 2011-11-29 ENCOUNTER — Ambulatory Visit (INDEPENDENT_AMBULATORY_CARE_PROVIDER_SITE_OTHER): Payer: Medicare Other | Admitting: Psychiatry

## 2011-11-29 DIAGNOSIS — F0630 Mood disorder due to known physiological condition, unspecified: Secondary | ICD-10-CM | POA: Diagnosis not present

## 2011-11-29 NOTE — Progress Notes (Signed)
11-29-2011  Patient and spouse seen for initial psychological evaluation.  They present with several marital issues exacerbated by her recent medical problems.  Session focused on exploration and resolution of marital concerns.  Gave patient handouts on coping skills and lifestyle choices.  Patient will call for appointment PRN.

## 2011-12-29 DIAGNOSIS — I10 Essential (primary) hypertension: Secondary | ICD-10-CM | POA: Diagnosis not present

## 2011-12-29 DIAGNOSIS — E785 Hyperlipidemia, unspecified: Secondary | ICD-10-CM | POA: Diagnosis not present

## 2011-12-29 DIAGNOSIS — C8179 Other classical Hodgkin lymphoma, extranodal and solid organ sites: Secondary | ICD-10-CM | POA: Diagnosis not present

## 2011-12-29 DIAGNOSIS — K219 Gastro-esophageal reflux disease without esophagitis: Secondary | ICD-10-CM | POA: Diagnosis not present

## 2012-01-03 ENCOUNTER — Other Ambulatory Visit (HOSPITAL_COMMUNITY): Payer: Medicare Other

## 2012-01-05 ENCOUNTER — Ambulatory Visit (HOSPITAL_COMMUNITY): Payer: Medicare Other | Admitting: Oncology

## 2012-01-07 ENCOUNTER — Emergency Department (HOSPITAL_COMMUNITY): Payer: Medicare Other

## 2012-01-07 ENCOUNTER — Encounter (HOSPITAL_COMMUNITY): Payer: Self-pay | Admitting: *Deleted

## 2012-01-07 ENCOUNTER — Emergency Department (HOSPITAL_COMMUNITY)
Admission: EM | Admit: 2012-01-07 | Discharge: 2012-01-07 | Disposition: A | Discharge status: Home / Self Care | Payer: Medicare Other | Attending: Emergency Medicine | Admitting: Emergency Medicine

## 2012-01-07 DIAGNOSIS — I2510 Atherosclerotic heart disease of native coronary artery without angina pectoris: Secondary | ICD-10-CM | POA: Insufficient documentation

## 2012-01-07 DIAGNOSIS — I10 Essential (primary) hypertension: Secondary | ICD-10-CM | POA: Insufficient documentation

## 2012-01-07 DIAGNOSIS — K769 Liver disease, unspecified: Secondary | ICD-10-CM | POA: Diagnosis not present

## 2012-01-07 DIAGNOSIS — R109 Unspecified abdominal pain: Secondary | ICD-10-CM | POA: Insufficient documentation

## 2012-01-07 DIAGNOSIS — R599 Enlarged lymph nodes, unspecified: Secondary | ICD-10-CM | POA: Insufficient documentation

## 2012-01-07 DIAGNOSIS — C8589 Other specified types of non-Hodgkin lymphoma, extranodal and solid organ sites: Secondary | ICD-10-CM | POA: Insufficient documentation

## 2012-01-07 LAB — URINALYSIS, ROUTINE W REFLEX MICROSCOPIC
Bilirubin Urine: NEGATIVE
Glucose, UA: NEGATIVE mg/dL
Hgb urine dipstick: NEGATIVE
Ketones, ur: NEGATIVE mg/dL
Leukocytes, UA: NEGATIVE
Nitrite: NEGATIVE
Protein, ur: NEGATIVE mg/dL
Specific Gravity, Urine: 1.009 (ref 1.005–1.030)
Urobilinogen, UA: 0.2 mg/dL (ref 0.0–1.0)
pH: 6.5 (ref 5.0–8.0)

## 2012-01-07 LAB — BASIC METABOLIC PANEL
BUN: 16 mg/dL (ref 6–23)
CO2: 29 mEq/L (ref 19–32)
Calcium: 9.4 mg/dL (ref 8.4–10.5)
Chloride: 103 mEq/L (ref 96–112)
Creatinine, Ser: 0.99 mg/dL (ref 0.50–1.10)
GFR calc Af Amer: 67 mL/min — ABNORMAL LOW (ref >90)
GFR calc non Af Amer: 58 mL/min — ABNORMAL LOW (ref >90)
Glucose, Bld: 91 mg/dL (ref 70–99)
Potassium: 3.6 mEq/L (ref 3.5–5.1)
Sodium: 140 mEq/L (ref 135–145)

## 2012-01-07 LAB — DIFFERENTIAL
Basophils Absolute: 0 10*3/uL (ref 0.0–0.1)
Basophils Relative: 1 % (ref 0–1)
Eosinophils Absolute: 0.1 10*3/uL (ref 0.0–0.7)
Eosinophils Relative: 4 % (ref 0–5)
Lymphocytes Relative: 32 % (ref 12–46)
Lymphs Abs: 0.9 10*3/uL (ref 0.7–4.0)
Monocytes Absolute: 0.3 10*3/uL (ref 0.1–1.0)
Monocytes Relative: 10 % (ref 3–12)
Neutro Abs: 1.5 10*3/uL — ABNORMAL LOW (ref 1.7–7.7)
Neutrophils Relative %: 53 % (ref 43–77)

## 2012-01-07 LAB — CBC
HCT: 38.4 % (ref 36.0–46.0)
Hemoglobin: 13.4 g/dL (ref 12.0–15.0)
MCH: 28.4 pg (ref 26.0–34.0)
MCHC: 34.9 g/dL (ref 30.0–36.0)
MCV: 81.4 fL (ref 78.0–100.0)
Platelets: 185 10*3/uL (ref 150–400)
RBC: 4.72 MIL/uL (ref 3.87–5.11)
RDW: 13.7 % (ref 11.5–15.5)
WBC: 2.8 10*3/uL — ABNORMAL LOW (ref 4.0–10.5)

## 2012-01-07 MED ORDER — OXYCODONE-ACETAMINOPHEN 5-325 MG PO TABS: 1.0000 | ORAL_TABLET | ORAL | Status: AC | PRN

## 2012-01-07 NOTE — ED Notes (Signed)
Pt from home with reports of right flank pain that radiates down right leg, pain started last Wednesday. Pt denies dysuria, N/V/D, fever, recent surgery or injury. Pt reports being diagnosed with lymphoma in January 2013.

## 2012-01-07 NOTE — Discharge Instructions (Signed)
Abdominal Pain Abdominal pain can be caused by many things. Your caregiver decides the seriousness of your pain by an examination and possibly blood tests and X-rays. Many cases can be observed and treated at home. Most abdominal pain is not caused by a disease and will probably improve without treatment. However, in many cases, more time must pass before a clear cause of the pain can be found. Before that point, it may not be known if you need more testing, or if hospitalization or surgery is needed. HOME CARE INSTRUCTIONS   Do not take laxatives unless directed by your caregiver.   Take pain medicine only as directed by your caregiver.   Only take over-the-counter or prescription medicines for pain, discomfort, or fever as directed by your caregiver.   Try a clear liquid diet (broth, tea, or water) for as long as directed by your caregiver. Slowly move to a bland diet as tolerated.  SEEK IMMEDIATE MEDICAL CARE IF:   The pain does not go away.   You have a fever.   You keep throwing up (vomiting).   The pain is felt only in portions of the abdomen. Pain in the right side could possibly be appendicitis. In an adult, pain in the left lower portion of the abdomen could be colitis or diverticulitis.   You pass bloody or black tarry stools.  MAKE SURE YOU:   Understand these instructions.   Will watch your condition.   Will get help right away if you are not doing well or get worse.  Document Released: 06/07/2005 Document Revised: 08/17/2011 Document Reviewed: 04/15/2008 [employer] Patient Information 2012 [employer].Abdominal Pain Abdominal pain can be caused by many things. Your caregiver decides the seriousness of your pain by an examination and possibly blood tests and X-rays. Many cases can be observed and treated at home. Most abdominal pain is not caused by a disease and will probably improve without treatment. However, in many cases, more time must pass before a clear cause of  the pain can be found. Before that point, it may not be known if you need more testing, or if hospitalization or surgery is needed. HOME CARE INSTRUCTIONS   Do not take laxatives unless directed by your caregiver.   Take pain medicine only as directed by your caregiver.   Only take over-the-counter or prescription medicines for pain, discomfort, or fever as directed by your caregiver.   Try a clear liquid diet (broth, tea, or water) for as long as directed by your caregiver. Slowly move to a bland diet as tolerated.  SEEK IMMEDIATE MEDICAL CARE IF:   The pain does not go away.   You have a fever.   You keep throwing up (vomiting).   The pain is felt only in portions of the abdomen. Pain in the right side could possibly be appendicitis. In an adult, pain in the left lower portion of the abdomen could be colitis or diverticulitis.   You pass bloody or black tarry stools.  MAKE SURE YOU:   Understand these instructions.   Will watch your condition.   Will get help right away if you are not doing well or get worse.  Document Released: 06/07/2005 Document Revised: 08/17/2011 Document Reviewed: 04/15/2008 [employer] Patient Information 2012 [employer]. 

## 2012-01-08 ENCOUNTER — Encounter (HOSPITAL_COMMUNITY): Payer: Medicare Other | Attending: Oncology

## 2012-01-08 DIAGNOSIS — C8589 Other specified types of non-Hodgkin lymphoma, extranodal and solid organ sites: Secondary | ICD-10-CM | POA: Diagnosis not present

## 2012-01-08 DIAGNOSIS — C8590 Non-Hodgkin lymphoma, unspecified, unspecified site: Secondary | ICD-10-CM

## 2012-01-08 LAB — COMPREHENSIVE METABOLIC PANEL
Albumin: 4.4 g/dL (ref 3.5–5.2)
BUN: 17 mg/dL (ref 6–23)
CO2: 28 mEq/L (ref 19–32)
Chloride: 102 mEq/L (ref 96–112)
Creatinine, Ser: 0.91 mg/dL (ref 0.50–1.10)
GFR calc non Af Amer: 64 mL/min — ABNORMAL LOW (ref >90)
Total Bilirubin: 0.4 mg/dL (ref 0.3–1.2)

## 2012-01-08 LAB — LACTATE DEHYDROGENASE: LDH: 153 U/L (ref 94–250)

## 2012-01-08 NOTE — Progress Notes (Signed)
Lab draw

## 2012-01-10 ENCOUNTER — Encounter (HOSPITAL_COMMUNITY): Payer: Medicare Other | Attending: Oncology | Admitting: Oncology

## 2012-01-10 ENCOUNTER — Ambulatory Visit (HOSPITAL_COMMUNITY): Payer: Medicare Other | Admitting: Oncology

## 2012-01-10 VITALS — BP 143/83 | HR 61 | Temp 98.0°F | Ht 63.75 in | Wt 176.2 lb

## 2012-01-10 DIAGNOSIS — C8298 Follicular lymphoma, unspecified, lymph nodes of multiple sites: Secondary | ICD-10-CM | POA: Diagnosis not present

## 2012-01-10 DIAGNOSIS — I10 Essential (primary) hypertension: Secondary | ICD-10-CM | POA: Diagnosis not present

## 2012-01-10 DIAGNOSIS — C8590 Non-Hodgkin lymphoma, unspecified, unspecified site: Secondary | ICD-10-CM

## 2012-01-10 DIAGNOSIS — C8589 Other specified types of non-Hodgkin lymphoma, extranodal and solid organ sites: Secondary | ICD-10-CM | POA: Insufficient documentation

## 2012-01-10 NOTE — Progress Notes (Signed)
Joyce Courier, MD, MD [address] [place] [address] [place] [address]  1. Lymphoma, low grade  cholecalciferol (VITAMIN D) 1000 UNITS tablet, CBC, Differential, CBC, Differential, Comprehensive metabolic panel, Lactate dehydrogenase, Sedimentation rate    CURRENT THERAPY: Observation  INTERVAL HISTORY: [name] 66 y.o. 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   I personally reviewed and went over laboratory results with the patient.  Her WBC count and neutrophils are mildly low.  Will perform a follow-up CBC in one month.  Encouraged good handwashing.  I personally reviewed and went over radiographic studies with the patient.  I personally reviewed and went over pathology results with the patient.  We had a long discussion regarding her diagnosis and the observation.  She is concerned about observing and not pursuing treatment.  So I spent a lot of time with this anxious patient reviewing her pathology and PET scan.  I utilized analogies to better illustrate the role of treatment versus observation.  All questions were answered.  She was confusing the stage of her disease with the grade of her histology.  I rectified this confusion.  We also spent a lot of time reviewing her recent CT scan that was performed in the ER that was unremarkable and revealed stability of her disease.    The patient denies any B symptoms including fevers, chills, night sweats, weight loss, and appetite changes. She denies any new nodules or changes in nodules.  She denies any infections or colds.    Past Medical History  Diagnosis Date  . CAD (coronary artery disease)   . GERD (gastroesophageal reflux disease)   . HTN  (hypertension)   . Hyperlipidemia   . Enlargement of lymph nodes 07/26/2011  . 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     is allergic to furosemide and latex.  Ms. Splinter does not currently have medications on file.  Past Surgical History  Procedure Date  . Mr breast bilateral cyst removal  . Abdominal hysterectomy   . Lymph node dissection x 3 different times  . Tubal ligation   . Knee arthroscopy w/ debridement 05/2011    right    Denies any headaches, dizziness, double vision, fevers, chills, night sweats, nausea, vomiting, diarrhea, constipation, chest pain, heart palpitations, shortness of breath, blood in stool, black tarry stool, urinary pain, urinary burning, urinary frequency, hematuria.   PHYSICAL EXAMINATION  ECOG PERFORMANCE STATUS: 0 - Asymptomatic  Filed Vitals:   01/10/12 1333  BP: 143/83  Pulse: 61  Temp: 98 F (36.7 C)    GENERAL:alert, healthy, no distress, well nourished, well developed, comfortable, cooperative, obese and smiling SKIN: skin color, texture, turgor are normal, no rashes or significant lesions HEAD: Normocephalic, No masses, lesions, tenderness or abnormalities EYES: normal, EOMI, Conjunctiva are pink and non-injected EARS: External ears normal OROPHARYNX:lips, buccal mucosa, and tongue normal and mucous membranes are moist  NECK: supple, trachea midline, left angle of the jaw lymph node noted measuring 0.5 cm. LYMPH:  no hepatosplenomegaly BREAST:not examined LUNGS: clear to auscultation and percussion HEART: regular rate & rhythm, no murmurs, no gallops, S1 normal  and S2 normal ABDOMEN:abdomen soft, non-tender, normal bowel sounds, no masses or organomegaly and no hepatosplenomegaly BACK: Back symmetric, no curvature., No CVA tenderness EXTREMITIES:less then 2 second capillary refill, no joint deformities, effusion, or inflammation, no edema, no skin discoloration, no clubbing, no cyanosis  NEURO: alert & oriented x 3 with fluent speech, no focal motor/sensory deficits, gait normal   LABORATORY DATA: CBC    Component Value Date/Time   WBC 2.8* 01/07/2012 1123   WBC 4.7 09/25/2011 1605   RBC 4.72 01/07/2012 1123   RBC 4.16 09/25/2011 1605   HGB 13.4 01/07/2012 1123   HGB 11.8 09/25/2011 1605   HCT 38.4 01/07/2012 1123   HCT 34.2* 09/25/2011 1605   PLT 185 01/07/2012 1123   PLT 252 09/25/2011 1605   MCV 81.4 01/07/2012 1123   MCV 82.2 09/25/2011 1605   MCH 28.4 01/07/2012 1123   MCH 28.4 09/25/2011 1605   MCHC 34.9 01/07/2012 1123   MCHC 34.6 09/25/2011 1605   RDW 13.7 01/07/2012 1123   RDW 14.5 09/25/2011 1605   LYMPHSABS 0.9 01/07/2012 1123   LYMPHSABS 1.1 09/25/2011 1605   MONOABS 0.3 01/07/2012 1123   MONOABS 0.4 09/25/2011 1605   EOSABS 0.1 01/07/2012 1123   EOSABS 0.1 09/25/2011 1605   BASOSABS 0.0 01/07/2012 1123   BASOSABS 0.0 09/25/2011 1605      Chemistry      Component Value Date/Time   NA 140 01/08/2012 0858   K 3.8 01/08/2012 0858   CL 102 01/08/2012 0858   CO2 28 01/08/2012 0858   BUN 17 01/08/2012 0858   CREATININE 0.91 01/08/2012 0858      Component Value Date/Time   CALCIUM 9.9 01/08/2012 0858   ALKPHOS 62 01/08/2012 0858   AST 18 01/08/2012 0858   ALT 14 01/08/2012 0858   BILITOT 0.4 01/08/2012 0858     Results for KANESHA, CADLE (MRN [id number]) as of 01/10/2012 14:54  Ref. Range 01/08/2012 08:58  LDH Latest Range: 94-250 U/L 153     RADIOGRAPHIC STUDIES:  01/07/2012  *RADIOLOGY REPORT*  Clinical Data: Rule out kidney stone, history of lymphoma  CT ABDOMEN AND PELVIS WITHOUT CONTRAST  Technique: Multidetector CT imaging of the  abdomen and pelvis was  performed following the standard protocol without intravenous  contrast.  Comparison: 08/02/2011  Findings:  Left base nodule measures 5.4 mm, image 38.  The spleen appears normal.  Both adrenal glands are within normal limits.  Mass within the inferior right hepatic lobe is identified and  measures 5.1 x 3.4 cm, image 26. Stable from 08/02/2011.  The gallbladder is normal. There is no biliary dilatation.  The pancreas is unremarkable.  Normal appearance of the right kidney. There is no right-sided  hydronephrosis or nephrolithiasis.  Exophytic structure arising from the upper pole of the left kidney  likely represents a cyst but is incompletely characterized without  IV contrast. Previously noted low density structure arising from  the inferior pole of the left kidney is difficult to visualize due  to lack of IV contrast. There is no left-sided nephrolithiasis or  obstructive uropathy.  The urinary bladder appears normal. No stones are seen within the  bladder.  Soft tissue infiltration within the jejunal mesentery is again  noted with multiple enlarged  lymph nodes. This is similar to the  previous exam and is consistent with the known diagnosis of  lymphoma. Index lymph node measures 1.6 cm, image 29. This is  compared with 1.8 cm on 11/02/2011. No retroperitoneal adenopathy.  No pelvic or inguinal adenopathy noted. The urinary bladder  appears normal. The stomach is normal. The small bowel loops are  unremarkable.  The appendix is normal.  Colonic diverticulosis is noted without acute inflammation.  Review of the visualized osseous structures is significant for mild  lumbar spondylosis. There is a first-degree anterolisthesis of L4  and L5.  IMPRESSION:  1. Negative for nephrolithiasis or obstructive uropathy.  2. Soft tissue infiltration and adenopathy within the mesentery is  similar to previous exam and consistent with the history of  lymphoma.    3. Stable size of mass involving the inferior right hepatic lobe  compatible with lymphomatous involvement.  Original Report Authenticated By: Rosealee Albee, M.D.   PATHOLOGY: 09/20/2011  Diagnosis Kidney, biopsy - NON-HODGKIN'S B-CELL LYMPHOMA. - SEE ONCOLOGY TABLE. Microscopic Comment LYMPHOMA Histologic type: Non-Hodgkin's B-cell lymphoma, favor follicular lymphoma. Grade (if applicable): Low grade. Flow cytometry: Monoclonal B-cell population identified ([id number]). Immunohistochemical stains: CD20, CD79a, CD3, CD43, CD5, CD10, and cyclin D1 with appropriate controls. Touch preps/imprints: Not performed. Comment: The sections show kidney parenchyma infiltrated by a dense, diffuse and vaguely nodular relatively monomorphic lymphoid population characterized by predominance of small round to angulated lymphoid cells with high nuclear cytoplasmic ratio, dense chromatin and inconspicuous nucleoli. The small lymphoid cells are admixed with scattered large lymphoid cells to a much lesser extent. No necrosis or brisk mitosis is identified. A limited flow cytometric panel was performed and shows a monoclonal, kappa restricted B-cell population. Confirmatory immunohistochemical stains were also performed and show that there is a significant and predominant B-cell population throughout, as demonstrated with CD79a and CD20 stains. This B-cell population is also strongly and diffusely positive for CD10. This is admixed with a component of T-cells to a lesser extent, as demonstrated with CD3, CD43 and CD5. There is no apparent coexpression of CD5 or CD43 in B-cell areas. Cyclin D1 is completely negative. Given the morphologic and immunophenotypic features, this is most compatible with non-Hodgkin's B-cell lymphoma of follicular center cell origin (follicular lymphoma). While it is difficult to grade lymphomas in small biopsy material, the predominance of small lymphoid cells in this case is  compatible with a low grade process. (BNS:eps 09/22/11) Guerry Bruin MD Pathologist, Electronic Signature (Case signed 09/22/2011)   Interpretation Tissue-Flow Cytometry MONOCLONAL B-CELL POPULATION IDENTIFIED. Diagnosis Comment: Due to limited cellular material, a limited panel was performed and shows a population of B-cells estimated at 42% of all lymphocytes in the sample displaying kappa light chain restriction. The findings are compatible with involvement by non-Hodgkin's B-cell lymphoma. (BNS:eps 09/22/11) Guerry Bruin MD Pathologist, Electronic Signature (Case signed 09/22/2011)     ASSESSMENT:  1. 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  2. Allergy to latex which causes a rash and swelling.  3. Lasix use makes her weak.  4. Obesity.  5. Hypertension.  6. Right knee arthritis with arthroscopic surgery by Dr. Lequita Halt in the past.  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 Glennon Hamilton years ago, not in  need of therapy.  8. Gastroesophageal reflux disease.  9. Multiple breast biopsies.   PLAN:  1. I personally reviewed and went over laboratory results with the patient. 2. Lab work in 4 weeks: CBC diff 3. Lab work in 8 weeks: CBC diff, CMET, LDH, ESR 4. Long discussion about her diagnosis, and her course of observation.  I have offered her a second opinion anywhere she wishes.  She is contemplating this and I have encouraged her to ascertain one.  She is considering a consultation at [facility].  She will continue to consider a second opinion.  5. I personally reviewed and went over radiographic studies with the patient. 6. Return in 8 weeks for follow-up.  More than 50% of the time spent with the  patient was utilized for counseling and coordination of care.  All questions were answered. The patient knows to call the clinic with any problems, questions or concerns. We can certainly see the patient much sooner if necessary.    I spent 30 minutes counseling the patient face to face. The total time spent in the appointment was 40 minutes.  Emmet Messer

## 2012-01-10 NOTE — Patient Instructions (Signed)
[facility]  Discharge Instructions  RECOMMENDATIONS MADE BY THE CONSULTANT AND ANY TEST RESULTS WILL BE SENT TO YOUR REFERRING DOCTOR.   EXAM FINDINGS BY MD TODAY AND SIGNS AND SYMPTOMS TO REPORT TO CLINIC OR PRIMARY MD:   Lab work in 1 month and 2 months and then to see  Dr. Mariel Sleet Let us know if you decide you would like a second opinion.  I acknowledge that I have been informed and understand all the instructions given to me and received a copy. I do not have any more questions at this time, but understand that I may call the Specialty [facility] at [facility] at [phone] during business hours should I have any further questions or need assistance in obtaining follow-up care.    __________________________________________  _____________  __________ Signature of Patient or Authorized Representative            Date                   Time    __________________________________________ Nurse's Signature

## 2012-01-11 NOTE — ED Provider Notes (Signed)
History    66 year old female with right flank to right lower quadrant pain. Gradual onset Wednesday. Intermittent but has had more frequent bleeding the last day. Pain is achy. Radiates from the area of her right flank to her right leg. Radiates down the posterior aspect to about the midthigh. No appreciable exacerbating/ relieving factors. No urinary complaints. No fevers or chills. Denies trauma. No nausea, vomiting or diarrhea. No history of similar pain. Recent diagnosis of lymphoma..  CSN: [id number]  Arrival date & time 01/07/12  1054   First MD Initiated Contact with Patient 01/07/12 1059      Chief Complaint  Patient presents with  . Flank Pain    right    (Consider location/radiation/quality/duration/timing/severity/associated sxs/prior treatment) HPI  Past Medical History  Diagnosis Date  . CAD (coronary artery disease)   . GERD (gastroesophageal reflux disease)   . HTN (hypertension)   . Hyperlipidemia   . Enlargement of lymph nodes 07/26/2011  . 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 Lymphoma     Past Surgical History  Procedure Date  . Mr breast bilateral cyst removal  . Abdominal hysterectomy   . Lymph node dissection x 3 different times  . Tubal ligation   . Knee arthroscopy w/ debridement 05/2011    right    Family History  Problem Relation Age of Onset  . Cancer    . Stroke    . Heart disease    . Coronary artery disease    . Cancer Mother     breast  . Stroke Mother   . Cancer Father     pancreatic cancer  . Cancer Brother     kidney cancer/brain tumor    History  Substance Use Topics  . Smoking status: Never Smoker   . Smokeless tobacco: Never Used  . Alcohol Use: No    OB History    Grav Para Term Preterm Abortions TAB SAB Ect Mult Living                  Review of Systems   Review of symptoms negative unless otherwise noted in HPI.   Allergies  Furosemide and Latex  Home Medications   Current  Outpatient Rx  Name Route Sig Dispense Refill  . ARTIFICIAL TEAR SOLUTION OP SOLN Ophthalmic Apply 1 each to eye daily.    . ASPIRIN 81 MG PO TABS Oral Take 81 mg by mouth every evening.     Marland Kitchen CAL-MAG-ZINC PO Oral Take 1 tablet by mouth 2 (two) times daily.      . IBUPROFEN 800 MG PO TABS Oral Take 800 mg by mouth every 6 (six) hours as needed. For pain    . LANSOPRAZOLE 30 MG PO CPDR Oral Take 30 mg by mouth every morning.     Marland Kitchen LORATADINE 10 MG PO TABS Oral Take 10 mg by mouth daily before breakfast.     . LOSARTAN POTASSIUM-HCTZ 100-12.5 MG PO TABS Oral Take 1 tablet by mouth every evening.     . ADULT MULTIVITAMIN W/MINERALS CH Oral Take 1 tablet by mouth daily.    Marland Kitchen FISH OIL 1200 MG PO CAPS Oral Take 1 capsule by mouth 2 (two) times daily.      Marland Kitchen POTASSIUM CHLORIDE CRYS ER 20 MEQ PO TBCR Oral Take 20 mEq by mouth daily.      Marland Kitchen VITAMIN B-6 100 MG PO TABS Oral Take 100 mg by mouth every morning.      Marland Kitchen  RED YEAST RICE 600 MG PO CAPS Oral Take 1 capsule by mouth every evening.     Marland Kitchen VITAMIN C 500 MG PO TABS Oral Take 500 mg by mouth daily.    Marland Kitchen VITAMIN D 1000 UNITS PO TABS Oral Take 2,000 Units by mouth daily.    . OXYCODONE-ACETAMINOPHEN 5-325 MG PO TABS Oral Take 1 tablet by mouth every 4 (four) hours as needed for pain. 10 tablet 0    BP 136/73  Pulse 64  Temp(Src) 98 F (36.7 C) (Oral)  Resp 20  Wt 176 lb (79.833 kg)  SpO2 99%  Physical Exam  Nursing note and vitals reviewed. Constitutional: She appears well-developed and well-nourished. No distress.  HENT:  Head: Normocephalic and atraumatic.  Eyes: Conjunctivae are normal. Right eye exhibits no discharge. Left eye exhibits no discharge.  Neck: Neck supple.  Cardiovascular: Normal rate, regular rhythm and normal heart sounds.  Exam reveals no gallop and no friction rub.   No murmur heard. Pulmonary/Chest: Effort normal and breath sounds normal. No respiratory distress.  Abdominal: Soft. She exhibits no distension and no  mass. There is no tenderness.  Genitourinary:       No CVA tenderness  Musculoskeletal: Normal range of motion. She exhibits no edema and no tenderness.       No midline spinal tenderness. No paraspinal tenderness. No tenderness over the SI joint. Overlying skin is grossly normal in appearance. No pelvic bony tenderness. No tenderness over the greater trochanter or distal femur. No pain with range of motion of her hips bilaterally. Negative straight leg test. Neurovascularly intact distally.  Neurological: She is alert.  Skin: Skin is warm and dry.  Psychiatric: She has a normal mood and affect. Her behavior is normal. Thought content normal.    ED Course  Procedures (including critical care time)  Labs Reviewed  CBC - Abnormal; Notable for the following:    WBC 2.8 (*)    All other components within normal limits  DIFFERENTIAL - Abnormal; Notable for the following:    Neutro Abs 1.5 (*)    All other components within normal limits  BASIC METABOLIC PANEL - Abnormal; Notable for the following:    GFR calc non Af Amer 58 (*)    GFR calc Af Amer 67 (*)    All other components within normal limits  URINALYSIS, ROUTINE W REFLEX MICROSCOPIC  LAB REPORT - SCANNED   No results found. Ct Abdomen Pelvis Wo Contrast  01/07/2012  *RADIOLOGY REPORT*  Clinical Data: Rule out kidney stone, history of lymphoma  CT ABDOMEN AND PELVIS WITHOUT CONTRAST  Technique:  Multidetector CT imaging of the abdomen and pelvis was performed following the standard protocol without intravenous contrast.  Comparison: 08/02/2011  Findings:  Left base nodule measures 5.4 mm, image 38.  The spleen appears normal.  Both adrenal glands are within normal limits.  Mass within the inferior right hepatic lobe is identified and measures 5.1 x 3.4 cm, image 26.  Stable from 08/02/2011.  The gallbladder is normal.  There is no biliary dilatation.  The pancreas is unremarkable.  Normal appearance of the right kidney.  There is no  right-sided hydronephrosis or nephrolithiasis.  Exophytic structure arising from the upper pole of the left kidney likely represents a cyst but is incompletely characterized without IV contrast.  Previously noted low density structure arising from the inferior pole of the left kidney is difficult to visualize due to lack of IV contrast.  There is no left-sided nephrolithiasis  or obstructive uropathy.  The urinary bladder appears normal.  No stones are seen within the bladder.  Soft tissue infiltration within the jejunal mesentery is again noted with multiple enlarged lymph nodes.  This is similar to the previous exam and is consistent with the known diagnosis of lymphoma.  Index lymph node measures 1.6 cm, image 29.  This is compared with 1.8 cm on 11/02/2011.  No retroperitoneal adenopathy.  No pelvic or inguinal adenopathy noted.  The urinary bladder appears normal.  The stomach is normal.  The small bowel loops are unremarkable.  The appendix is normal.  Colonic diverticulosis is noted without acute inflammation.  Review of the visualized osseous structures is significant for mild lumbar spondylosis. There is a first-degree anterolisthesis of L4 and L5.  IMPRESSION:  1.  Negative for nephrolithiasis or obstructive uropathy. 2.  Soft tissue infiltration and adenopathy within the mesentery is similar to previous exam and consistent with the history of lymphoma. 3.  Stable size of mass involving the inferior right hepatic lobe compatible with lymphomatous involvement.  Original Report Authenticated By: Rosealee Albee, M.D.   1. Right flank pain       MDM  66 year old female with right flank and right hip pain. She poorly localizes this. Abdominal exam is benign. No tenderness of her hip, lower back, buttock or thigh. There is no reproducibility of symptoms with range of motion of her hip. Etiology unclear but doubt he emergent. Patient is afebrile. CT scan was performed because of the patient's age but   does not show any acute abnormalities which would explain her symptoms. Her symptoms possibly could be from enlarged mesenteric nodes. The appearance is similar to prior scans though. Urinalysis is not suggestive of infection. No blood in urine which is typical for her ureteral stones. Return precautions were discussed. Outpatient followup otherwise        Raeford Razor, MD 01/11/12 1354

## 2012-02-12 ENCOUNTER — Encounter (HOSPITAL_COMMUNITY): Payer: Medicare Other | Attending: Oncology

## 2012-02-12 DIAGNOSIS — C8590 Non-Hodgkin lymphoma, unspecified, unspecified site: Secondary | ICD-10-CM

## 2012-02-12 DIAGNOSIS — C8589 Other specified types of non-Hodgkin lymphoma, extranodal and solid organ sites: Secondary | ICD-10-CM | POA: Diagnosis not present

## 2012-02-12 LAB — DIFFERENTIAL
Basophils Absolute: 0 10*3/uL (ref 0.0–0.1)
Lymphocytes Relative: 35 % (ref 12–46)
Neutro Abs: 1.5 10*3/uL — ABNORMAL LOW (ref 1.7–7.7)
Neutrophils Relative %: 50 % (ref 43–77)

## 2012-02-12 LAB — CBC
Platelets: 173 10*3/uL (ref 150–400)
RDW: 14 % (ref 11.5–15.5)
WBC: 3 10*3/uL — ABNORMAL LOW (ref 4.0–10.5)

## 2012-02-12 NOTE — Progress Notes (Signed)
Lab draw

## 2012-02-20 DIAGNOSIS — Q828 Other specified congenital malformations of skin: Secondary | ICD-10-CM | POA: Diagnosis not present

## 2012-03-04 DIAGNOSIS — T148 Other injury of unspecified body region: Secondary | ICD-10-CM | POA: Diagnosis not present

## 2012-03-11 ENCOUNTER — Encounter (HOSPITAL_BASED_OUTPATIENT_CLINIC_OR_DEPARTMENT_OTHER): Payer: Medicare Other | Admitting: Oncology

## 2012-03-11 ENCOUNTER — Encounter (HOSPITAL_COMMUNITY): Payer: Self-pay | Admitting: Oncology

## 2012-03-11 ENCOUNTER — Encounter (HOSPITAL_COMMUNITY): Payer: Medicare Other | Attending: Oncology

## 2012-03-11 VITALS — BP 176/91 | HR 68 | Temp 97.5°F | Wt 177.6 lb

## 2012-03-11 DIAGNOSIS — C8590 Non-Hodgkin lymphoma, unspecified, unspecified site: Secondary | ICD-10-CM

## 2012-03-11 DIAGNOSIS — C8298 Follicular lymphoma, unspecified, lymph nodes of multiple sites: Secondary | ICD-10-CM

## 2012-03-11 DIAGNOSIS — C8589 Other specified types of non-Hodgkin lymphoma, extranodal and solid organ sites: Secondary | ICD-10-CM | POA: Diagnosis not present

## 2012-03-11 LAB — COMPREHENSIVE METABOLIC PANEL
AST: 22 U/L (ref 0–37)
Alkaline Phosphatase: 63 U/L (ref 39–117)
CO2: 27 mEq/L (ref 19–32)
Chloride: 101 mEq/L (ref 96–112)
Creatinine, Ser: 0.97 mg/dL (ref 0.50–1.10)
GFR calc non Af Amer: 60 mL/min — ABNORMAL LOW (ref >90)
Potassium: 3.8 mEq/L (ref 3.5–5.1)
Total Bilirubin: 0.5 mg/dL (ref 0.3–1.2)

## 2012-03-11 LAB — SEDIMENTATION RATE: Sed Rate: 5 mm/hr (ref 0–22)

## 2012-03-11 LAB — CBC
HCT: 37.6 % (ref 36.0–46.0)
Hemoglobin: 13 g/dL (ref 12.0–15.0)
WBC: 3.3 10*3/uL — ABNORMAL LOW (ref 4.0–10.5)

## 2012-03-11 LAB — DIFFERENTIAL
Basophils Absolute: 0.1 10*3/uL (ref 0.0–0.1)
Lymphocytes Relative: 22 % (ref 12–46)
Monocytes Absolute: 0.3 10*3/uL (ref 0.1–1.0)
Monocytes Relative: 9 % (ref 3–12)
Neutro Abs: 2.1 10*3/uL (ref 1.7–7.7)

## 2012-03-11 NOTE — Progress Notes (Signed)
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 She has no B. symptoms. She remains asymptomatic.  Problem #2 recent tick bite finishing doxycycline tomorrow.  Right knee arthroscopic surgery by Dr. Lequita Halt with mild symptomatology presently and she may need to see him again if this persists. She had a massage to the right knee a couple weeks ago which started this symptomatology.  Her other problems are in the chart but she is asymptomatic. Her vital signs are stable. She is with her husband today. She has a little prominence of the left parotid gland area which is not different. The areas' 2 x 2 centimeters and is protuberant by about half a centimeter. She has no nodes otherwise in the cervical supraclavicular infraclavicular axillary or inguinal areas that I can appreciate. She has no palpable hepatosplenomegaly. She remains overweight for her height. Her heart shows a regular rhythm and rate without murmur rub or gallop. Her lungs are clear to auscultation and percussion. Her skin exam shows a few benign seborrheic keratoses. And she has no peripheral edema other than puffiness in the right leg which is also not new or different. there's no heat tenderness or cords in either calf. Her abdomen shows good bowel sounds no distention no ascites and no masses.  Her blood work is pending but since she is asymptomatic we'll see her back in late October for blood work in early November for checkup. She still does not need or meet criteria for chemotherapy. I reassured her extensively about this. We will make a copy of her path report and her MRI report for her.

## 2012-03-11 NOTE — Progress Notes (Signed)
Lab draw

## 2012-03-11 NOTE — Patient Instructions (Signed)
[facility]  Discharge Instructions  RECOMMENDATIONS MADE BY THE CONSULTANT AND ANY TEST RESULTS WILL BE SENT TO YOUR REFERRING DOCTOR.   There are no Brylan Seubert changes with your condition. You have a diagnosis of Non-Hodgkins B-Cell Lymphoma. There is nothing that we need to do now other than watch you. This is not a hereditary trait. We will see you back at the end of 3 months. We will need to do lab work just before your next appointment. Report any issues/concerns to clinic prior to appointment if needed.   I acknowledge that I have been informed and understand all the instructions given to me and received a copy. I do not have any more questions at this time, but understand that I may call the Specialty Clinic at [facility] at [phone] during business hours should I have any further questions or need assistance in obtaining follow-up care.    __________________________________________  _____________  __________ Signature of Patient or Authorized Representative            Date                   Time    __________________________________________ Nurse's Signature

## 2012-06-03 DIAGNOSIS — E785 Hyperlipidemia, unspecified: Secondary | ICD-10-CM | POA: Diagnosis not present

## 2012-06-03 DIAGNOSIS — I10 Essential (primary) hypertension: Secondary | ICD-10-CM | POA: Diagnosis not present

## 2012-06-03 DIAGNOSIS — C8179 Other classical Hodgkin lymphoma, extranodal and solid organ sites: Secondary | ICD-10-CM | POA: Diagnosis not present

## 2012-06-15 ENCOUNTER — Encounter (HOSPITAL_COMMUNITY): Payer: Self-pay

## 2012-06-15 ENCOUNTER — Emergency Department (HOSPITAL_COMMUNITY): Payer: Medicare Other

## 2012-06-15 ENCOUNTER — Emergency Department (HOSPITAL_COMMUNITY)
Admission: EM | Admit: 2012-06-15 | Discharge: 2012-06-15 | Disposition: A | Discharge status: Home / Self Care | Payer: Medicare Other | Attending: Emergency Medicine | Admitting: Emergency Medicine

## 2012-06-15 DIAGNOSIS — R296 Repeated falls: Secondary | ICD-10-CM | POA: Insufficient documentation

## 2012-06-15 DIAGNOSIS — I25810 Atherosclerosis of coronary artery bypass graft(s) without angina pectoris: Secondary | ICD-10-CM | POA: Insufficient documentation

## 2012-06-15 DIAGNOSIS — S0003XA Contusion of scalp, initial encounter: Secondary | ICD-10-CM | POA: Insufficient documentation

## 2012-06-15 DIAGNOSIS — M5030 Other cervical disc degeneration, unspecified cervical region: Secondary | ICD-10-CM | POA: Insufficient documentation

## 2012-06-15 DIAGNOSIS — S0083XA Contusion of other part of head, initial encounter: Secondary | ICD-10-CM

## 2012-06-15 DIAGNOSIS — R55 Syncope and collapse: Secondary | ICD-10-CM

## 2012-06-15 DIAGNOSIS — Z79899 Other long term (current) drug therapy: Secondary | ICD-10-CM | POA: Diagnosis not present

## 2012-06-15 DIAGNOSIS — R404 Transient alteration of awareness: Secondary | ICD-10-CM | POA: Insufficient documentation

## 2012-06-15 DIAGNOSIS — R079 Chest pain, unspecified: Secondary | ICD-10-CM | POA: Diagnosis not present

## 2012-06-15 DIAGNOSIS — S199XXA Unspecified injury of neck, initial encounter: Secondary | ICD-10-CM | POA: Diagnosis not present

## 2012-06-15 DIAGNOSIS — S0993XA Unspecified injury of face, initial encounter: Secondary | ICD-10-CM | POA: Diagnosis not present

## 2012-06-15 DIAGNOSIS — S0990XA Unspecified injury of head, initial encounter: Secondary | ICD-10-CM | POA: Diagnosis not present

## 2012-06-15 DIAGNOSIS — I10 Essential (primary) hypertension: Secondary | ICD-10-CM | POA: Insufficient documentation

## 2012-06-15 LAB — BASIC METABOLIC PANEL
BUN: 20 mg/dL (ref 6–23)
Calcium: 9.7 mg/dL (ref 8.4–10.5)
Chloride: 99 mEq/L (ref 96–112)
Creatinine, Ser: 1 mg/dL (ref 0.50–1.10)
GFR calc Af Amer: 67 mL/min — ABNORMAL LOW (ref >90)
GFR calc non Af Amer: 57 mL/min — ABNORMAL LOW (ref >90)

## 2012-06-15 MED ORDER — HYDROCODONE-ACETAMINOPHEN 5-325 MG PO TABS: 1.0000 | ORAL_TABLET | ORAL | Status: DC | PRN

## 2012-06-15 NOTE — ED Notes (Signed)
Pt reports that she has a history of dizziness when she feels nauseated or has bowel problems. Pt was sitting on toilet today and reports that she was having "a hard time passing gas" then "became hot and sweaty" and then "woke up on the floor" after a momentary LOC. Pt was able to get up and reports that she now "feels fine, just a little sore on forehead." 4/10. Pt has no focal neurologic defects and denies chest pain and shortness of breath.

## 2012-06-15 NOTE — ED Notes (Signed)
Pt reports sitting on toilet attempting to have a bm, "passed out" hit head on tile floor. Reports +loc, but able to recall all events. Denies any n/v  Since fall, pain is only "where knot is" left side of head.

## 2012-06-15 NOTE — ED Provider Notes (Signed)
Medical screening examination/treatment/procedure(s) were performed by non-physician practitioner and as supervising physician I was immediately available for consultation/collaboration.   Zayde Stroupe L Jordyan Hardiman, MD 06/15/12 1620 

## 2012-06-15 NOTE — ED Provider Notes (Signed)
History     CSN: [id number]  Arrival date & time 06/15/12  4098   First MD Initiated Contact with Patient 06/15/12 1025      Chief Complaint  Patient presents with  . Fall    (Consider location/radiation/quality/duration/timing/severity/associated sxs/prior treatment) HPI Comments: Patient states she was sitting on the toilet this a.m. attempting to have a bowel movement, when she" passed out". She failed to 4 and hit her head. She was gradually able to get up but noted a" knot" on the left side of her head. After the fall she has not had vomiting. She has not been confused. She's been able to use all extremities. Family states her speech has been fine. Patient presents for evaluation concerning the fall and the knot on her head.  The history is provided by the patient.    Past Medical History  Diagnosis Date  . CAD (coronary artery disease)   . GERD (gastroesophageal reflux disease)   . HTN (hypertension)   . Hyperlipidemia   . Enlargement of lymph nodes 07/26/2011  . 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 Lymphoma   . Tick bite of knee     Past Surgical History  Procedure Date  . Mr breast bilateral cyst removal  . Abdominal hysterectomy   . Lymph node dissection x 3 different times  . Tubal ligation   . Knee arthroscopy w/ debridement 05/2011    right    Family History  Problem Relation Age of Onset  . Cancer    . Stroke    . Heart disease    . Coronary artery disease    . Cancer Mother     breast  . Stroke Mother   . Cancer Father     pancreatic cancer  . Cancer Brother     kidney cancer/brain tumor    History  Substance Use Topics  . Smoking status: Never Smoker   . Smokeless tobacco: Never Used  . Alcohol Use: No    OB History    Grav Para Term Preterm Abortions TAB SAB Ect Mult Living                  Review of Systems  Constitutional: Negative for activity change.       All ROS Neg except as noted in HPI  HENT:  Negative for nosebleeds and neck pain.   Eyes: Negative for photophobia and discharge.  Respiratory: Negative for cough, shortness of breath and wheezing.   Cardiovascular: Positive for chest pain. Negative for palpitations.  Gastrointestinal: Negative for abdominal pain and blood in stool.  Genitourinary: Negative for dysuria, frequency and hematuria.  Musculoskeletal: Positive for arthralgias. Negative for back pain.  Skin: Negative.   Neurological: Negative for dizziness, seizures and speech difficulty.  Psychiatric/Behavioral: Negative for hallucinations and confusion.    Allergies  Furosemide and Latex  Home Medications   Current Outpatient Rx  Name Route Sig Dispense Refill  . ASPIRIN 81 MG PO TABS Oral Take 81 mg by mouth at bedtime.     Marland Kitchen CAL-MAG-ZINC PO Oral Take 1 tablet by mouth 2 (two) times daily.      Marland Kitchen VITAMIN D 1000 UNITS PO TABS Oral Take 2,000 Units by mouth daily.    . IBUPROFEN 800 MG PO TABS Oral Take 800 mg by mouth every 6 (six) hours as needed. For pain    . LANSOPRAZOLE 30 MG PO CPDR Oral Take 30 mg by mouth daily.     Marland Kitchen  LORATADINE 10 MG PO TABS Oral Take 10 mg by mouth daily.     Marland Kitchen LOSARTAN POTASSIUM-HCTZ 100-12.5 MG PO TABS Oral Take 1 tablet by mouth every evening.     . ADULT MULTIVITAMIN W/MINERALS CH Oral Take 1 tablet by mouth daily.    Marland Kitchen FISH OIL 1200 MG PO CAPS Oral Take 1 capsule by mouth 2 (two) times daily.      Marland Kitchen POLYVINYL ALCOHOL 1.4 % OP SOLN Both Eyes Place 1 drop into both eyes as needed. For dry eyes    . POTASSIUM CHLORIDE CRYS ER 20 MEQ PO TBCR Oral Take 20 mEq by mouth daily.     Marland Kitchen PROBIOTIC DAILY PO Oral Take 1 capsule by mouth daily.    Marland Kitchen VITAMIN B-6 100 MG PO TABS Oral Take 100 mg by mouth daily.     . RED YEAST RICE 600 MG PO CAPS Oral Take 1 capsule by mouth every evening.     Marland Kitchen VITAMIN C 500 MG PO TABS Oral Take 500 mg by mouth daily.      BP 134/85  Pulse 87  Temp 99 F (37.2 C) (Oral)  Resp 18  Ht 5\' 2"  (1.575 m)  Wt 178  lb (80.74 kg)  BMI 32.56 kg/m2  SpO2 100%  Physical Exam  Nursing note and vitals reviewed. Constitutional: She is oriented to person, place, and time. She appears well-developed and well-nourished.  Non-toxic appearance.  HENT:  Head: Normocephalic.  Right Ear: Tympanic membrane and external ear normal.  Left Ear: Tympanic membrane and external ear normal.       Small hematoma left forehead. No TMJ or jaw pain.  Eyes: EOM and lids are normal. Pupils are equal, round, and reactive to light.  Neck: Normal range of motion. Neck supple. Carotid bruit is not present.  Cardiovascular: Normal rate, regular rhythm, normal heart sounds, intact distal pulses and normal pulses.   Pulmonary/Chest: Breath sounds normal. No respiratory distress.  Abdominal: Soft. Bowel sounds are normal. There is no tenderness. There is no guarding.  Musculoskeletal: Normal range of motion.  Lymphadenopathy:       Head (right side): No submandibular adenopathy present.       Head (left side): No submandibular adenopathy present.    She has no cervical adenopathy.  Neurological: She is alert and oriented to person, place, and time. She has normal strength. No cranial nerve deficit or sensory deficit. She exhibits normal muscle tone. Coordination normal.  Skin: Skin is warm and dry.  Psychiatric: She has a normal mood and affect. Her speech is normal.    ED Course  Procedures (including critical care time)  Labs Reviewed - No data to display No results found.   No diagnosis found.    MDM  I have reviewed nursing notes, vital signs, and all appropriate lab and imaging results for this patient. The patient was sitting on the commode when she fell to the floor and sustained a hematoma to her 4 head. The CT scan of the head is negative for fracture or intracranial problem. The CT of the neck is negative for fracture or dislocation. The basic metabolic panel is also well within normal limits. The patient is  ambulatory in the room without any problem or complication. It is safe at this time for the patient be discharged home. Patient is given a prescription for Norco 5 mg #15 tablets for pain if needed.   Kathie Dike, [place] 06/15/12 1200

## 2012-07-08 ENCOUNTER — Encounter (HOSPITAL_COMMUNITY): Payer: Medicare Other | Attending: Oncology

## 2012-07-08 DIAGNOSIS — C8589 Other specified types of non-Hodgkin lymphoma, extranodal and solid organ sites: Secondary | ICD-10-CM | POA: Diagnosis not present

## 2012-07-08 DIAGNOSIS — C8590 Non-Hodgkin lymphoma, unspecified, unspecified site: Secondary | ICD-10-CM

## 2012-07-08 LAB — CBC
HCT: 35.7 % — ABNORMAL LOW (ref 36.0–46.0)
MCHC: 34.7 g/dL (ref 30.0–36.0)
Platelets: 183 10*3/uL (ref 150–400)
RDW: 13.9 % (ref 11.5–15.5)
WBC: 3.3 10*3/uL — ABNORMAL LOW (ref 4.0–10.5)

## 2012-07-08 LAB — COMPREHENSIVE METABOLIC PANEL
Alkaline Phosphatase: 59 U/L (ref 39–117)
BUN: 20 mg/dL (ref 6–23)
Calcium: 9.5 mg/dL (ref 8.4–10.5)
GFR calc Af Amer: 72 mL/min — ABNORMAL LOW (ref >90)
GFR calc non Af Amer: 62 mL/min — ABNORMAL LOW (ref >90)
Glucose, Bld: 103 mg/dL — ABNORMAL HIGH (ref 70–99)
Potassium: 3.9 mEq/L (ref 3.5–5.1)
Total Protein: 6.8 g/dL (ref 6.0–8.3)

## 2012-07-08 LAB — DIFFERENTIAL
Eosinophils Absolute: 0.2 10*3/uL (ref 0.0–0.7)
Eosinophils Relative: 5 % (ref 0–5)
Lymphs Abs: 0.9 10*3/uL (ref 0.7–4.0)
Monocytes Relative: 5 % (ref 3–12)
Neutrophils Relative %: 62 % (ref 43–77)

## 2012-07-08 LAB — LACTATE DEHYDROGENASE: LDH: 163 U/L (ref 94–250)

## 2012-07-08 NOTE — Progress Notes (Signed)
Labs drawn today for cbc/diff,cmp,ldh 

## 2012-07-10 ENCOUNTER — Encounter (HOSPITAL_BASED_OUTPATIENT_CLINIC_OR_DEPARTMENT_OTHER): Payer: Medicare Other | Admitting: Oncology

## 2012-07-10 VITALS — BP 138/91 | HR 76 | Temp 98.2°F | Resp 16 | Wt 175.0 lb

## 2012-07-10 DIAGNOSIS — Z23 Encounter for immunization: Secondary | ICD-10-CM | POA: Diagnosis not present

## 2012-07-10 DIAGNOSIS — C8589 Other specified types of non-Hodgkin lymphoma, extranodal and solid organ sites: Secondary | ICD-10-CM

## 2012-07-10 DIAGNOSIS — C8590 Non-Hodgkin lymphoma, unspecified, unspecified site: Secondary | ICD-10-CM

## 2012-07-10 MED ORDER — INFLUENZA VIRUS VACC SPLIT PF IM SUSP
INTRAMUSCULAR | Status: AC
  Filled 2012-07-10: qty 0.5

## 2012-07-10 MED ORDER — INFLUENZA VIRUS VACC SPLIT PF IM SUSP
0.5000 mL | INTRAMUSCULAR | Status: AC
  Administered 2012-07-10: 0.5 mL via INTRAMUSCULAR

## 2012-07-10 NOTE — Patient Instructions (Addendum)
[facility]  Discharge Instructions  RECOMMENDATIONS MADE BY THE CONSULTANT AND ANY TEST RESULTS WILL BE SENT TO YOUR REFERRING DOCTOR.   EXAM FINDINGS BY MD TODAY AND SIGNS AND SYMPTOMS TO REPORT TO CLINIC OR PRIMARY MD: fever, night sweats, unintentional weight loss.   SPECIAL INSTRUCTIONS/FOLLOW-UP: Return to Clinic in four months, please see the front desk to schedule your appointments.  You were given the Flu Vaccine today.  Please call us with any concerns.     I acknowledge that I have been informed and understand all the instructions given to me and received a copy. I do not have any more questions at this time, but understand that I may call the Specialty Clinic at [facility] at [phone] during business hours should I have any further questions or need assistance in obtaining follow-up care.    __________________________________________  _____________  __________ Signature of Patient or Authorized Representative            Date                   Time    __________________________________________ Nurse's Signature

## 2012-07-10 NOTE — Progress Notes (Signed)
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 She had a kidney biopsy on 09/20/2011 which showed atypical lymphoid proliferation changes and lymph nodes in the left neck pursue platelet fossa on the left. She has no B. symptomatology. She remains asymptomatic. Problem #2 right knee arthroscopic surgery by Dr. Lequita Halt still with some symptomatology and I prefer her back to him at her convenience. Problem #3 stress ulcers in her mouth consistent with aphthous stomatitis. She is having her kitchen replaced and other work done at the house and is due to have Thanksgiving dinner at her house.  She is doing well otherwise and remains asymptomatic. Her review of systems is noncontributory from an oncology standpoint.  Vital signs are stable weight is still excessive for her height. She remains mildly constipated due to lack of fiber rich food intake. Bowel sounds are normal however and there is no hepatosplenomegaly. She has no arm or leg edema. She does have a few pigmented lesions which she and her husband need to watch periodically. Her lungs though are perfectly clear to auscultation and percussion. Heart shows a regular rhythm and rate without murmur rub or gallop. She has a 1.5 x 2 cm lymph node in the left angle of the jaw all and a similar-sized lymph node on the right angle of the jaw. She has no lymphadenopathy in the inguinal axillary infraclavicular supraclavicular or other cervical areas.  Her labs today were excellent. We will see her back in 4 months. She does not need any interventional therapy.

## 2012-08-12 ENCOUNTER — Encounter: Payer: Self-pay | Admitting: Cardiology

## 2012-08-12 ENCOUNTER — Ambulatory Visit (INDEPENDENT_AMBULATORY_CARE_PROVIDER_SITE_OTHER): Payer: Medicare Other | Admitting: Cardiology

## 2012-08-12 VITALS — BP 134/85 | HR 69 | Ht 62.0 in | Wt 177.8 lb

## 2012-08-12 DIAGNOSIS — I10 Essential (primary) hypertension: Secondary | ICD-10-CM

## 2012-08-12 DIAGNOSIS — E785 Hyperlipidemia, unspecified: Secondary | ICD-10-CM | POA: Diagnosis not present

## 2012-08-12 DIAGNOSIS — Q245 Malformation of coronary vessels: Secondary | ICD-10-CM

## 2012-08-12 NOTE — Patient Instructions (Addendum)
The current medical regimen is effective;  continue present plan and medications.  Follow up as needed 

## 2012-08-12 NOTE — Progress Notes (Signed)
HPI The patient presents for followup of a known coronary anomaly. Since I last saw her she has been followed by oncology for management of lymphoma. This is being observed and hasn't required intervention. She says she feels sluggish since being diagnosed with this. However, she is not describing any chest pressure, neck or arm discomfort. She's not having any palpitations, presyncope or syncope or if she's had no PND or orthopnea. She hasn't been exercising but she does vacuum without limitations.  Allergies  Allergen Reactions  . Furosemide Other (See Comments)    Makes her feel very weak  . Latex Swelling and Rash    Current Outpatient Prescriptions  Medication Sig Dispense Refill  . aspirin 81 MG tablet Take 81 mg by mouth at bedtime.       . Calcium-Magnesium-Zinc (CAL-MAG-ZINC PO) Take 1 tablet by mouth 2 (two) times daily.        . cholecalciferol (VITAMIN D) 1000 UNITS tablet Take 2,000 Units by mouth daily.      Marland Kitchen ibuprofen (ADVIL,MOTRIN) 800 MG tablet Take 800 mg by mouth every 6 (six) hours as needed. For pain      . lansoprazole (PREVACID) 30 MG capsule Take 30 mg by mouth daily.       Marland Kitchen loratadine (CLARITIN) 10 MG tablet Take 10 mg by mouth daily.       Marland Kitchen losartan-hydrochlorothiazide (HYZAAR) 100-12.5 MG per tablet Take 1 tablet by mouth every evening.       . Multiple Vitamin (MULITIVITAMIN WITH MINERALS) TABS Take 1 tablet by mouth daily.      . Omega-3 Fatty Acids (FISH OIL) 1200 MG CAPS Take 1 capsule by mouth 2 (two) times daily.        . polyvinyl alcohol (LIQUIFILM TEARS) 1.4 % ophthalmic solution Place 1 drop into both eyes as needed. For dry eyes      . potassium chloride SA (K-DUR,KLOR-CON) 20 MEQ tablet Take 20 mEq by mouth daily.       . Probiotic Product (PROBIOTIC DAILY PO) Take 1 capsule by mouth daily.      Marland Kitchen pyridOXINE (VITAMIN B-6) 100 MG tablet Take 100 mg by mouth daily.       . Red Yeast Rice 600 MG CAPS Take 1 capsule by mouth every evening.       .  vitamin C (ASCORBIC ACID) 500 MG tablet Take 500 mg by mouth daily.        Past Medical History  Diagnosis Date  . CAD (coronary artery disease)   . GERD (gastroesophageal reflux disease)   . HTN (hypertension)   . Hyperlipidemia   . Enlargement of lymph nodes 07/26/2011  . 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 Lymphoma   . Tick bite of knee     Past Surgical History  Procedure Date  . Mr breast bilateral cyst removal  . Abdominal hysterectomy   . Lymph node dissection x 3 different times  . Tubal ligation   . Knee arthroscopy w/ debridement 05/2011    right    ROS: Reflux. Otherwise as stated in the HPI and negative for all other systems.  PHYSICAL EXAM BP 134/85  Pulse 69  Ht 5\' 2"  (1.575 m)  Wt 177 lb 12.8 oz (80.65 kg)  BMI 32.52 kg/m2 GENERAL:  Well appearing NECK:  No jugular venous distention, waveform within normal limits, carotid upstroke brisk and symmetric, no bruits, no thyromegaly LYMPHATICS:  No cervical, inguinal adenopathy LUNGS:  Clear to auscultation bilaterally BACK:  No CVA tenderness CHEST:  Unremarkable HEART:  PMI not displaced or sustained,S1 and S2 within normal limits, no S3, no S4, no clicks, no rubs, no murmurs ABD:  Flat, positive bowel sounds normal in frequency in pitch, no bruits, no rebound, no guarding, no midline pulsatile mass, no hepatomegaly, no splenomegaly EXT:  2 plus pulses throughout, no edema, no cyanosis no clubbing   ASSESSMENT AND PLAN  CORONARY ARTERY ANOMALY, CONGENITAL She has no symptoms related to this. He did have a stress perfusion study in 2011. At this point there is no indication for repeat study.  I did review her most recent EKG from October.  ESSENTIAL HYPERTENSION, BENIGN Her blood pressure is controlled. She will continue the meds as listed.   HYPERLIPIDEMIA She cannot tolerate prescription meds and will continue with red yeast rice.

## 2012-08-28 DIAGNOSIS — I10 Essential (primary) hypertension: Secondary | ICD-10-CM | POA: Diagnosis not present

## 2012-08-28 DIAGNOSIS — E119 Type 2 diabetes mellitus without complications: Secondary | ICD-10-CM | POA: Diagnosis not present

## 2012-08-28 DIAGNOSIS — C8179 Other classical Hodgkin lymphoma, extranodal and solid organ sites: Secondary | ICD-10-CM | POA: Diagnosis not present

## 2012-08-28 DIAGNOSIS — M1990 Unspecified osteoarthritis, unspecified site: Secondary | ICD-10-CM | POA: Diagnosis not present

## 2012-08-28 DIAGNOSIS — E785 Hyperlipidemia, unspecified: Secondary | ICD-10-CM | POA: Diagnosis not present

## 2012-09-02 ENCOUNTER — Encounter (HOSPITAL_COMMUNITY): Payer: Self-pay | Admitting: Emergency Medicine

## 2012-09-02 ENCOUNTER — Emergency Department (HOSPITAL_COMMUNITY)
Admission: EM | Admit: 2012-09-02 | Discharge: 2012-09-02 | Disposition: A | Discharge status: Home / Self Care | Payer: Medicare Other | Attending: Emergency Medicine | Admitting: Emergency Medicine

## 2012-09-02 ENCOUNTER — Emergency Department (HOSPITAL_COMMUNITY): Payer: Medicare Other

## 2012-09-02 DIAGNOSIS — E785 Hyperlipidemia, unspecified: Secondary | ICD-10-CM | POA: Insufficient documentation

## 2012-09-02 DIAGNOSIS — Z87898 Personal history of other specified conditions: Secondary | ICD-10-CM | POA: Diagnosis not present

## 2012-09-02 DIAGNOSIS — I2510 Atherosclerotic heart disease of native coronary artery without angina pectoris: Secondary | ICD-10-CM | POA: Insufficient documentation

## 2012-09-02 DIAGNOSIS — D376 Neoplasm of uncertain behavior of liver, gallbladder and bile ducts: Secondary | ICD-10-CM | POA: Insufficient documentation

## 2012-09-02 DIAGNOSIS — Z79899 Other long term (current) drug therapy: Secondary | ICD-10-CM | POA: Insufficient documentation

## 2012-09-02 DIAGNOSIS — Z7982 Long term (current) use of aspirin: Secondary | ICD-10-CM | POA: Diagnosis not present

## 2012-09-02 DIAGNOSIS — K219 Gastro-esophageal reflux disease without esophagitis: Secondary | ICD-10-CM | POA: Diagnosis not present

## 2012-09-02 DIAGNOSIS — R079 Chest pain, unspecified: Secondary | ICD-10-CM | POA: Diagnosis not present

## 2012-09-02 DIAGNOSIS — I10 Essential (primary) hypertension: Secondary | ICD-10-CM | POA: Insufficient documentation

## 2012-09-02 DIAGNOSIS — R21 Rash and other nonspecific skin eruption: Secondary | ICD-10-CM | POA: Insufficient documentation

## 2012-09-02 LAB — APTT: aPTT: 35 seconds (ref 24–37)

## 2012-09-02 LAB — COMPREHENSIVE METABOLIC PANEL
ALT: 25 U/L (ref 0–35)
AST: 31 U/L (ref 0–37)
Albumin: 4.1 g/dL (ref 3.5–5.2)
Alkaline Phosphatase: 62 U/L (ref 39–117)
GFR calc Af Amer: 70 mL/min — ABNORMAL LOW (ref >90)
Glucose, Bld: 94 mg/dL (ref 70–99)
Potassium: 3.5 mEq/L (ref 3.5–5.1)
Sodium: 139 mEq/L (ref 135–145)
Total Protein: 6.8 g/dL (ref 6.0–8.3)

## 2012-09-02 LAB — CBC
Hemoglobin: 12.9 g/dL (ref 12.0–15.0)
MCHC: 34.8 g/dL (ref 30.0–36.0)
Platelets: 191 10*3/uL (ref 150–400)

## 2012-09-02 LAB — TROPONIN I: Troponin I: <0.3 ng/mL (ref <0.30)

## 2012-09-02 MED ORDER — ASPIRIN 81 MG PO CHEW
324.0000 mg | CHEWABLE_TABLET | Freq: Once | ORAL | Status: AC
  Administered 2012-09-02: 324 mg via ORAL
  Filled 2012-09-02: qty 4

## 2012-09-02 MED ORDER — SODIUM CHLORIDE 0.9 % IV SOLN
1000.0000 mL | INTRAVENOUS | Status: DC
  Administered 2012-09-02: 1000 mL via INTRAVENOUS

## 2012-09-02 MED ORDER — SUCRALFATE 1 G PO TABS: 1.0000 g | ORAL_TABLET | Freq: Four times a day (QID) | ORAL | Status: DC

## 2012-09-02 MED ORDER — ACETAMINOPHEN 325 MG PO TABS
ORAL_TABLET | ORAL | Status: AC
  Filled 2012-09-02: qty 2

## 2012-09-02 MED ORDER — GI COCKTAIL ~~LOC~~
30.0000 mL | Freq: Once | ORAL | Status: AC
  Administered 2012-09-02: 30 mL via ORAL
  Filled 2012-09-02: qty 30

## 2012-09-02 NOTE — ED Notes (Signed)
Patient with c/o chest pain, mid to left sided, burning in nature. Patient with h/o heart defect "in artery". Patient states h/o reflux, but this pain is different.

## 2012-09-02 NOTE — ED Provider Notes (Signed)
History   This chart was scribed for Joyce Kras, MD by Charolett Bumpers, ED Scribe. The patient was seen in room APA02/APA02. Patient's care was started at 1034.  CSN: [id number] Arrival date & time 09/02/12  1017  First MD Initiated Contact with Patient 09/02/12 1034      Chief Complaint  Patient presents with  . Chest Pain   The history is provided by the patient. No language interpreter was used.   [name] is a 66 y.o. female who presents to the Emergency Department complaining of constant, severe burning chest pain over the past 12 hours. She states that the burning continued this morning when she woke up and has her concerned. She has a h/o lymphoma, but husband states she has been doing well and has not needed treatment, but does give her anxiety. She reports a few areas on her face that concerns here and are associated with her lymphoma. She denies any fevers, cough, new leg swelling. She denies any modifying factors. She states that she has a h/o acid reflux in the past and an an abnormally formed artery.   Past Medical History  Diagnosis Date  . CAD (coronary artery disease)   . GERD (gastroesophageal reflux disease)   . HTN (hypertension)   . Hyperlipidemia   . Enlargement of lymph nodes 07/26/2011  . 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 Lymphoma   . Tick bite of knee     Past Surgical History  Procedure Date  . Mr breast bilateral cyst removal  . Abdominal hysterectomy   . Lymph node dissection x 3 different times  . Tubal ligation   . Knee arthroscopy w/ debridement 05/2011    right    Family History  Problem Relation Age of Onset  . Cancer    . Stroke    . Heart disease    . Coronary artery disease    . Cancer Mother     breast  . Stroke Mother   . Cancer Father     pancreatic cancer  . Cancer Brother     kidney cancer/brain tumor    History  Substance Use Topics  . Smoking status: Never Smoker   . Smokeless  tobacco: Never Used  . Alcohol Use: No    OB History    Grav Para Term Preterm Abortions TAB SAB Ect Mult Living                  Review of Systems  Constitutional: Negative for fever.  Respiratory: Negative for cough.   Cardiovascular: Positive for chest pain. Negative for leg swelling.  Skin: Positive for rash.       Areas on face.   All other systems reviewed and are negative.    Allergies  Furosemide and Latex  Home Medications   Current Outpatient Rx  Name  Route  Sig  Dispense  Refill  . ASPIRIN 81 MG PO TABS   Oral   Take 81 mg by mouth at bedtime.          Marland Kitchen CAL-MAG-ZINC PO   Oral   Take 1 tablet by mouth 2 (two) times daily.           Marland Kitchen VITAMIN D 1000 UNITS PO TABS   Oral   Take 2,000 Units by mouth daily.         . IBUPROFEN 800 MG PO TABS   Oral   Take 800 mg  by mouth every 6 (six) hours as needed. For pain         . LANSOPRAZOLE 30 MG PO CPDR   Oral   Take 30 mg by mouth daily.          Marland Kitchen LORATADINE 10 MG PO TABS   Oral   Take 10 mg by mouth daily.          Marland Kitchen LOSARTAN POTASSIUM-HCTZ 100-12.5 MG PO TABS   Oral   Take 1 tablet by mouth every evening.          . ADULT MULTIVITAMIN W/MINERALS CH   Oral   Take 1 tablet by mouth daily.         Marland Kitchen FISH OIL 1200 MG PO CAPS   Oral   Take 1 capsule by mouth 2 (two) times daily.           Marland Kitchen POLYVINYL ALCOHOL 1.4 % OP SOLN   Both Eyes   Place 1 drop into both eyes as needed. For dry eyes         . POTASSIUM CHLORIDE CRYS ER 20 MEQ PO TBCR   Oral   Take 20 mEq by mouth daily.          Marland Kitchen PROBIOTIC DAILY PO   Oral   Take 1 capsule by mouth daily.         Marland Kitchen VITAMIN B-6 100 MG PO TABS   Oral   Take 100 mg by mouth daily.          . RED YEAST RICE 600 MG PO CAPS   Oral   Take 1 capsule by mouth every evening.          Marland Kitchen VITAMIN C 500 MG PO TABS   Oral   Take 500 mg by mouth daily.           BP 144/80  Pulse 66  Temp 98.2 F (36.8 C) (Oral)  Resp 18   Ht 5\' 2"  (1.575 m)  Wt 178 lb (80.74 kg)  BMI 32.56 kg/m2  SpO2 98%  Physical Exam  Nursing note and vitals reviewed. Constitutional: She appears well-developed and well-nourished. No distress.  HENT:  Head: Normocephalic and atraumatic.  Right Ear: External ear normal.  Left Ear: External ear normal.  Eyes: Conjunctivae normal are normal. Right eye exhibits no discharge. Left eye exhibits no discharge. No scleral icterus.  Neck: Neck supple. No tracheal deviation present.  Cardiovascular: Normal rate, regular rhythm and intact distal pulses.   Pulmonary/Chest: Effort normal and breath sounds normal. No stridor. No respiratory distress. She has no wheezes. She has no rales. She exhibits no tenderness.  Abdominal: Soft. Bowel sounds are normal. She exhibits no distension. There is no tenderness. There is no rebound and no guarding.  Musculoskeletal: She exhibits no edema and no tenderness.  Lymphadenopathy:    She has no cervical adenopathy.  Neurological: She is alert. She has normal strength. No sensory deficit. Cranial nerve deficit:  no gross defecits noted. She exhibits normal muscle tone. She displays no seizure activity. Coordination normal.  Skin: Skin is warm and dry. No rash noted.       Few scaly macular, hyperpigmented, well circumscribed areas on face. No induration.   Psychiatric: She has a normal mood and affect.    ED Course  Procedures (including critical care time)  DIAGNOSTIC STUDIES: Oxygen Saturation is 98% on room air, normal by my interpretation.    COORDINATION OF CARE:  10:50-Discussed planned course  of treatment with the patient including IV fluids, GI cocktail, aspirin, chest x-ray and blood work, who is agreeable at this time.  11:15-Medication Orders: 0.9% sodium chloride infusion 1,000 mL-continuous; GI cocktail (Maalox, Lidocaine, Donnatal)-once; Aspirin chewable tablet 324 mg-once.   Results for orders placed during the hospital encounter of  09/02/12  CBC      Component Value Range   WBC 3.5 (*) 4.0 - 10.5 K/uL   RBC 4.51  3.87 - 5.11 MIL/uL   Hemoglobin 12.9  12.0 - 15.0 g/dL   HCT 16.1  09.6 - 04.5 %   MCV 82.3  78.0 - 100.0 fL   MCH 28.6  26.0 - 34.0 pg   MCHC 34.8  30.0 - 36.0 g/dL   RDW 40.9  81.1 - 91.4 %   Platelets 191  150 - 400 K/uL  COMPREHENSIVE METABOLIC PANEL      Component Value Range   Sodium 139  135 - 145 mEq/L   Potassium 3.5  3.5 - 5.1 mEq/L   Chloride 103  96 - 112 mEq/L   CO2 28  19 - 32 mEq/L   Glucose, Bld 94  70 - 99 mg/dL   BUN 16  6 - 23 mg/dL   Creatinine, Ser 7.82  0.50 - 1.10 mg/dL   Calcium 9.3  8.4 - 95.6 mg/dL   Total Protein 6.8  6.0 - 8.3 g/dL   Albumin 4.1  3.5 - 5.2 g/dL   AST 31  0 - 37 U/L   ALT 25  0 - 35 U/L   Alkaline Phosphatase 62  39 - 117 U/L   Total Bilirubin 0.4  0.3 - 1.2 mg/dL   GFR calc non Af Amer 61 (*) >90 mL/min   GFR calc Af Amer 70 (*) >90 mL/min  PROTIME-INR      Component Value Range   Prothrombin Time 12.6  11.6 - 15.2 seconds   INR 0.95  0.00 - 1.49  APTT      Component Value Range   aPTT 35  24 - 37 seconds  TROPONIN I      Component Value Range   Troponin I <0.30  <0.30 ng/mL    Dg Chest Portable 1 View  09/02/2012  *RADIOLOGY REPORT*  Clinical Data: Chest pain for 1 day, coronary artery disease, hypertension  PORTABLE CHEST - 1 VIEW  Comparison: Portable exam 1103 hours compared to 06/20/2011  Findings: Minimal enlargement of cardiac silhouette. Tortuous aorta. Pulmonary vascularity normal. Lungs clear. No pleural effusion, pneumothorax or significant osseous findings.  IMPRESSION: Minimal enlargement of cardiac silhouette. No acute abnormalities.   Original Report Authenticated By: Ulyses Southward, M.D.       MDM  Think the patient's chest pain is most likely related to gastroesophageal reflux symptoms. Symptoms have been ongoing for at least 12 hours. She has a history of an anomalous coronary artery but no history of obstructive coronary  artery disease. Fact that her heart enzymes are normal after 12 hours of constant pain history sure. I will add Carafate to the patient's medication regimen. She will continue her Prevacid. I instructed to return to emergency room for any worsening symptoms otherwise plan on following up with her doctor next week.   I personally performed the services described in this documentation, which was scribed in my presence. The recorded information has been reviewed and is accurate.       Joyce Kras, MD 09/02/12 [phone]

## 2012-09-02 NOTE — ED Notes (Signed)
Patient with no complaints at this time. Respirations even and unlabored. Skin warm/dry. Discharge instructions reviewed with patient at this time. Patient given opportunity to voice concerns/ask questions. IV removed per policy and band-aid applied to site. Patient discharged at this time and left Emergency Department with steady gait.  

## 2012-09-02 NOTE — ED Notes (Signed)
Slight red, raised rash at base of R thumb where IV was.  Dr. Lynelle Doctor notified and assessed site. Okay'ed d/c.

## 2012-09-10 ENCOUNTER — Telehealth (HOSPITAL_COMMUNITY): Payer: Self-pay

## 2012-09-10 ENCOUNTER — Other Ambulatory Visit: Payer: Self-pay | Admitting: Otolaryngology

## 2012-09-10 DIAGNOSIS — Q828 Other specified congenital malformations of skin: Secondary | ICD-10-CM | POA: Diagnosis not present

## 2012-09-10 DIAGNOSIS — R599 Enlarged lymph nodes, unspecified: Secondary | ICD-10-CM

## 2012-09-10 NOTE — Telephone Encounter (Signed)
Message from patient stated "The ENT want to do an ultrasound and I wanted to know if Dr. Mariel Sleet felt is was really necessary?"  Discussed with Dr. Mariel Sleet and if it's of patient's thyroid ok to proceed, if it's of patient's lymph nodes ultrasound is not necessary.  Patient notified and verbalized understanding.

## 2012-09-21 DIAGNOSIS — N342 Other urethritis: Secondary | ICD-10-CM | POA: Diagnosis not present

## 2012-10-21 ENCOUNTER — Ambulatory Visit
Admission: RE | Admit: 2012-10-21 | Discharge: 2012-10-21 | Disposition: A | Discharge status: Home / Self Care | Payer: Medicare Other | Source: Ambulatory Visit | Attending: Otolaryngology | Admitting: Otolaryngology

## 2012-10-21 DIAGNOSIS — R599 Enlarged lymph nodes, unspecified: Secondary | ICD-10-CM

## 2012-10-21 DIAGNOSIS — E041 Nontoxic single thyroid nodule: Secondary | ICD-10-CM | POA: Diagnosis not present

## 2012-10-22 ENCOUNTER — Telehealth (HOSPITAL_COMMUNITY): Payer: Self-pay

## 2012-10-22 NOTE — Telephone Encounter (Signed)
Call from patient - states "I had an ultrasound of my thyroid yesterday and Dr. Emeline Darling called me and said I had a nodule and he felt it needed to be biopsied.  I want to get Dr. Winfred Burn input before I agree to this."

## 2012-10-22 NOTE — Telephone Encounter (Signed)
Patient notified information below regarding biopsy.

## 2012-10-22 NOTE — Telephone Encounter (Signed)
Message copied by Evelena Leyden on Tue Oct 22, 2012 10:52 AM ------      Message from: Mariel Sleet, ERIC S      Created: Tue Oct 22, 2012 10:13 AM       Have reviewed with Dr Tyron Russell      She could go either way and probably do fine, namely have Bx or wait 6 mo and repeat study, then if any change,Bx then, or if stable f/u test in 12 mo later ------

## 2012-10-28 ENCOUNTER — Other Ambulatory Visit: Payer: Self-pay | Admitting: Otolaryngology

## 2012-10-28 DIAGNOSIS — E041 Nontoxic single thyroid nodule: Secondary | ICD-10-CM

## 2012-11-04 ENCOUNTER — Encounter (HOSPITAL_COMMUNITY): Payer: Medicare Other | Attending: Oncology

## 2012-11-04 DIAGNOSIS — C8299 Follicular lymphoma, unspecified, extranodal and solid organ sites: Secondary | ICD-10-CM | POA: Insufficient documentation

## 2012-11-04 DIAGNOSIS — C8589 Other specified types of non-Hodgkin lymphoma, extranodal and solid organ sites: Secondary | ICD-10-CM | POA: Diagnosis not present

## 2012-11-04 LAB — COMPREHENSIVE METABOLIC PANEL
Alkaline Phosphatase: 66 U/L (ref 39–117)
BUN: 16 mg/dL (ref 6–23)
CO2: 28 mEq/L (ref 19–32)
GFR calc Af Amer: 70 mL/min — ABNORMAL LOW (ref >90)
GFR calc non Af Amer: 61 mL/min — ABNORMAL LOW (ref >90)
Glucose, Bld: 97 mg/dL (ref 70–99)
Potassium: 3.9 mEq/L (ref 3.5–5.1)
Total Protein: 6.9 g/dL (ref 6.0–8.3)

## 2012-11-04 LAB — CBC WITH DIFFERENTIAL/PLATELET
Eosinophils Absolute: 0.1 10*3/uL (ref 0.0–0.7)
Eosinophils Relative: 5 % (ref 0–5)
Hemoglobin: 12.9 g/dL (ref 12.0–15.0)
Lymphs Abs: 0.9 10*3/uL (ref 0.7–4.0)
MCH: 28.1 pg (ref 26.0–34.0)
MCV: 82.4 fL (ref 78.0–100.0)
Monocytes Relative: 7 % (ref 3–12)
RBC: 4.59 MIL/uL (ref 3.87–5.11)

## 2012-11-04 LAB — LACTATE DEHYDROGENASE: LDH: 181 U/L (ref 94–250)

## 2012-11-04 NOTE — Progress Notes (Signed)
Labs drawn today for cbc/diff,cmp,ldh,sed rate 

## 2012-11-08 ENCOUNTER — Encounter (HOSPITAL_BASED_OUTPATIENT_CLINIC_OR_DEPARTMENT_OTHER): Payer: Medicare Other | Admitting: Oncology

## 2012-11-08 ENCOUNTER — Encounter (HOSPITAL_COMMUNITY): Payer: Self-pay | Admitting: Oncology

## 2012-11-08 VITALS — BP 140/85 | HR 68 | Temp 98.4°F | Resp 16 | Wt 183.2 lb

## 2012-11-08 DIAGNOSIS — N894 Leukoplakia of vagina: Secondary | ICD-10-CM | POA: Diagnosis not present

## 2012-11-08 DIAGNOSIS — L989 Disorder of the skin and subcutaneous tissue, unspecified: Secondary | ICD-10-CM | POA: Diagnosis not present

## 2012-11-08 DIAGNOSIS — E049 Nontoxic goiter, unspecified: Secondary | ICD-10-CM

## 2012-11-08 DIAGNOSIS — C8299 Follicular lymphoma, unspecified, extranodal and solid organ sites: Secondary | ICD-10-CM

## 2012-11-08 NOTE — Progress Notes (Signed)
#  1 low-grade non-Hodgkin's lymphoma, CD20 positive, B-cell follicular type. She had a kidney biopsy on 09/20/2011 showing atypical lymphoid perforation changes and a left neck lymph node biopsy and supraclavicular lymph node fossa biopsy on the left showing the above lymphoma. She remains without B. symptomatology and asymptomatic. #2 leukoplakia-like changes on the right labia extending for approximately4-5 cm worrisome for squamous cell carcinoma #3 pigmented lesion on left for head which is also palpably raised approximately 1 cm to 12 mm. I will send her to Dr. Margo Aye for dermatology consultation #4 thyroid lesion, probably benign but soon to have a biopsy for confirmation  She comes today without B. symptomatology or any symptoms referable to her lymphoma. She has several questions about her thyroid biopsy which I think I answered to their satisfaction.  She has new skin lesions one over the left eyebrow laterally on the for head and the other she has noticed on the right labia. She treated herself for yeast but with no improvement in this lesion. She mentioned it to her primary care physician but did not show it to him.  BP 140/85  Pulse 68  Temp(Src) 98.4 F (36.9 C) (Oral)  Resp 16  Wt 183 lb 3.2 oz (83.099 kg)  BMI 33.5 kg/m2  She remains in no acute distress. I do not feel palpable enlargement of her thyroid gland essentially. There is no lymphadenopathy in the cervical, supraclavicular, infraclavicular, axillary, epitrochlear, or inguinal areas. She has no palpable hepatosplenomegaly. Her lungs are clear. Skin is mentioned above. Right labial lesion is mentioned above. Abdomen is soft and nontender. Breast exam is negative for masses. Heart shows a regular rhythm and rate without murmur rub or gallop. She has no arm or leg edema. Lungs are clear to auscultation and percussion. She also has a 1 cm slightly pigmented Area at the base of her neck which appears very benign to me. She has a  similar lesion over the right for had just above the right eyebrow again very benign appearing.  Her blood work is stable. LDH is normal.  We will get her GYN consultation as soon as possible and a dermatology consultation. I told her she needs to proceed with the thyroid biopsy

## 2012-11-08 NOTE — Patient Instructions (Addendum)
[facility] Discharge Instructions  RECOMMENDATIONS MADE BY THE CONSULTANT AND ANY TEST RESULTS WILL BE SENT TO YOUR REFERRING PHYSICIAN.  EXAM FINDINGS BY THE PHYSICIAN TODAY AND SIGNS OR SYMPTOMS TO REPORT TO CLINIC OR PRIMARY PHYSICIAN: exam and discussion by MD.  Your lymphoma is stable and MD is not worried about it right now.  You need to see GYN regarding the lesion on your labia.  We will make the appointment for you.  You also need to see a dermatologist about the lesion on your left forehead.  Go ahead with the biopsy of your thyroid.  MEDICATIONS PRESCRIBED:  none  INSTRUCTIONS GIVEN AND DISCUSSED: Report fevers, night sweats or recurring infections.  SPECIAL INSTRUCTIONS/FOLLOW-UP: Follow-up in 4 months for blood work then to see MD.  Thank you for choosing [facility] to provide your oncology and hematology care.  To afford each patient quality time with our providers, please arrive at least 15 minutes before your scheduled appointment time.  With your help, our goal is to use those 15 minutes to complete the necessary work-up to ensure our physicians have the information they need to help with your evaluation and healthcare recommendations.    Effective January 1st, 2014, we ask that you re-schedule your appointment with our physicians should you arrive 10 or more minutes late for your appointment.  We strive to give you quality time with our providers, and arriving late affects you and other patients whose appointments are after yours.    Again, thank you for choosing [facility]  Our hope is that these requests will decrease the amount of time that you wait before being seen by our physicians.       _____________________________________________________________  Should you have questions after your visit to [facility], please contact our office at [phone] between the hours of 8:30 a.m. and 5:00 p.m.   Voicemails left after 4:30 p.m. will not be returned until the following business day.  For prescription refill requests, have your pharmacy contact our office with your prescription refill request.

## 2012-11-13 DIAGNOSIS — L819 Disorder of pigmentation, unspecified: Secondary | ICD-10-CM | POA: Diagnosis not present

## 2012-11-13 DIAGNOSIS — L821 Other seborrheic keratosis: Secondary | ICD-10-CM | POA: Diagnosis not present

## 2012-11-14 ENCOUNTER — Other Ambulatory Visit: Payer: Self-pay | Admitting: Obstetrics & Gynecology

## 2012-11-14 DIAGNOSIS — L280 Lichen simplex chronicus: Secondary | ICD-10-CM | POA: Diagnosis not present

## 2012-11-14 DIAGNOSIS — N904 Leukoplakia of vulva: Secondary | ICD-10-CM | POA: Diagnosis not present

## 2012-11-18 DIAGNOSIS — I10 Essential (primary) hypertension: Secondary | ICD-10-CM | POA: Diagnosis not present

## 2012-11-18 DIAGNOSIS — E041 Nontoxic single thyroid nodule: Secondary | ICD-10-CM | POA: Diagnosis not present

## 2012-11-19 ENCOUNTER — Other Ambulatory Visit (HOSPITAL_COMMUNITY)
Admission: RE | Admit: 2012-11-19 | Discharge: 2012-11-19 | Disposition: A | Discharge status: Home / Self Care | Payer: Medicare Other | Source: Ambulatory Visit | Attending: Diagnostic Radiology | Admitting: Diagnostic Radiology

## 2012-11-19 ENCOUNTER — Ambulatory Visit
Admission: RE | Admit: 2012-11-19 | Discharge: 2012-11-19 | Disposition: A | Discharge status: Home / Self Care | Payer: Medicare Other | Source: Ambulatory Visit | Attending: Otolaryngology | Admitting: Otolaryngology

## 2012-11-19 DIAGNOSIS — E041 Nontoxic single thyroid nodule: Secondary | ICD-10-CM | POA: Diagnosis not present

## 2012-12-04 ENCOUNTER — Telehealth (HOSPITAL_COMMUNITY): Payer: Self-pay

## 2012-12-04 ENCOUNTER — Other Ambulatory Visit (HOSPITAL_COMMUNITY): Payer: Self-pay

## 2012-12-04 NOTE — Telephone Encounter (Addendum)
Call from [name] - has seen GYN regarding area on labia and was diagnosed as LICHEN SIMPLEX CHRONICUS and was given clobetasol 0.05% cream to use.

## 2012-12-18 ENCOUNTER — Other Ambulatory Visit: Payer: Self-pay

## 2012-12-18 DIAGNOSIS — Z1231 Encounter for screening mammogram for malignant neoplasm of breast: Secondary | ICD-10-CM

## 2013-01-03 DIAGNOSIS — Z9889 Other specified postprocedural states: Secondary | ICD-10-CM | POA: Diagnosis not present

## 2013-01-10 DIAGNOSIS — Q828 Other specified congenital malformations of skin: Secondary | ICD-10-CM | POA: Diagnosis not present

## 2013-01-14 DIAGNOSIS — H5200 Hypermetropia, unspecified eye: Secondary | ICD-10-CM | POA: Diagnosis not present

## 2013-01-14 DIAGNOSIS — H524 Presbyopia: Secondary | ICD-10-CM | POA: Diagnosis not present

## 2013-01-14 DIAGNOSIS — H2589 Other age-related cataract: Secondary | ICD-10-CM | POA: Diagnosis not present

## 2013-01-14 DIAGNOSIS — H52229 Regular astigmatism, unspecified eye: Secondary | ICD-10-CM | POA: Diagnosis not present

## 2013-01-21 ENCOUNTER — Ambulatory Visit
Admission: RE | Admit: 2013-01-21 | Discharge: 2013-01-21 | Disposition: A | Discharge status: Home / Self Care | Payer: Medicare Other | Source: Ambulatory Visit

## 2013-01-21 DIAGNOSIS — Z1231 Encounter for screening mammogram for malignant neoplasm of breast: Secondary | ICD-10-CM

## 2013-01-22 ENCOUNTER — Other Ambulatory Visit: Payer: Self-pay | Admitting: Family Medicine

## 2013-01-22 DIAGNOSIS — R928 Other abnormal and inconclusive findings on diagnostic imaging of breast: Secondary | ICD-10-CM

## 2013-01-27 ENCOUNTER — Encounter: Payer: Self-pay | Admitting: *Deleted

## 2013-01-27 ENCOUNTER — Ambulatory Visit: Payer: Self-pay | Admitting: Obstetrics & Gynecology

## 2013-01-28 ENCOUNTER — Encounter: Payer: Self-pay | Admitting: Obstetrics & Gynecology

## 2013-01-28 ENCOUNTER — Ambulatory Visit (INDEPENDENT_AMBULATORY_CARE_PROVIDER_SITE_OTHER): Payer: Medicare Other | Admitting: Obstetrics & Gynecology

## 2013-01-28 VITALS — BP 130/80 | Wt 190.0 lb

## 2013-01-28 DIAGNOSIS — L940 Localized scleroderma [morphea]: Secondary | ICD-10-CM | POA: Diagnosis not present

## 2013-01-28 DIAGNOSIS — L900 Lichen sclerosus et atrophicus: Secondary | ICD-10-CM

## 2013-01-28 NOTE — Progress Notes (Signed)
Patient ID: [name], female   DOB: 02-04-1946, 67 y.o.   MRN: [id number] Follow up from 3.17.2014  LSA on Temovate 0.05% Doing much better Using every other night  Exam No leukoplakia No lesions  Continue treatment for LSA  Fu 6 months

## 2013-01-31 ENCOUNTER — Ambulatory Visit
Admission: RE | Admit: 2013-01-31 | Discharge: 2013-01-31 | Disposition: A | Discharge status: Home / Self Care | Payer: Medicare Other | Source: Ambulatory Visit | Attending: Family Medicine | Admitting: Family Medicine

## 2013-01-31 DIAGNOSIS — R928 Other abnormal and inconclusive findings on diagnostic imaging of breast: Secondary | ICD-10-CM

## 2013-02-04 ENCOUNTER — Other Ambulatory Visit: Payer: Medicare Other

## 2013-02-08 IMAGING — US US BIOPSY
1 series · 13 of 15 positions shown · non-contrast
Comparison: none

CLINICAL DATA: 1.8 cm mass of the lower pole of the left kidney.
The patient presents for biopsy.

[Series 1: us biopsy · 0.24mm/px · 13 of 15 slices shown]
[im 1/15]
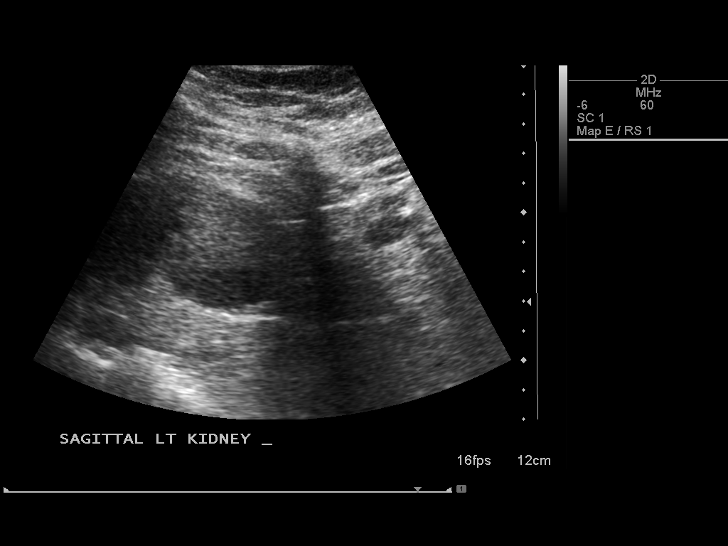
[im 2/15]
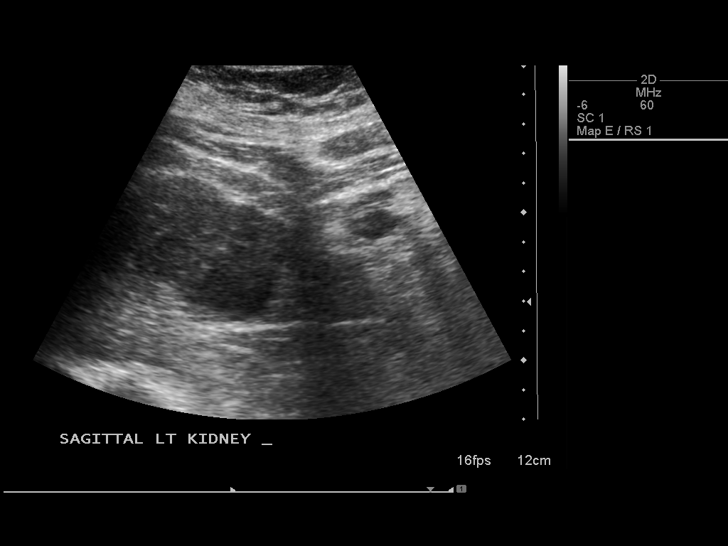
[im 3/15]
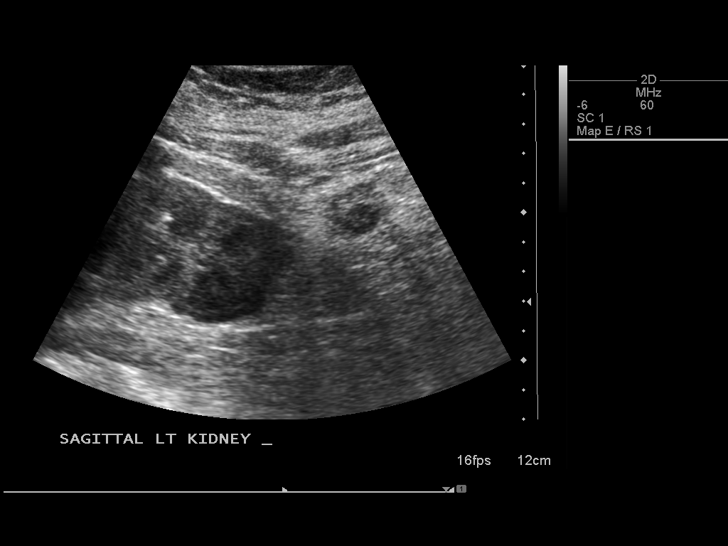
[im 5/15]
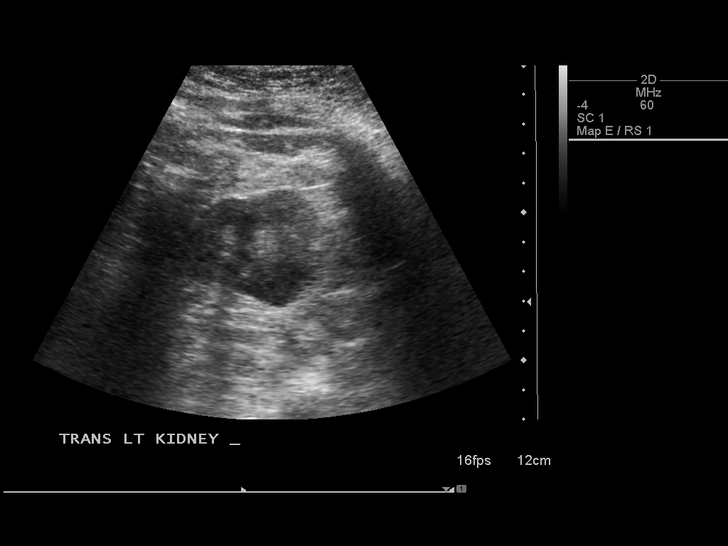
[im 6/15]
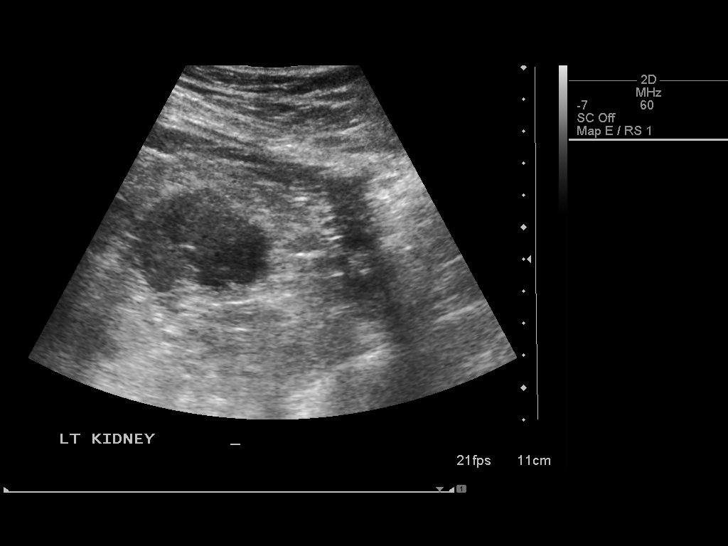
[im 7/15]
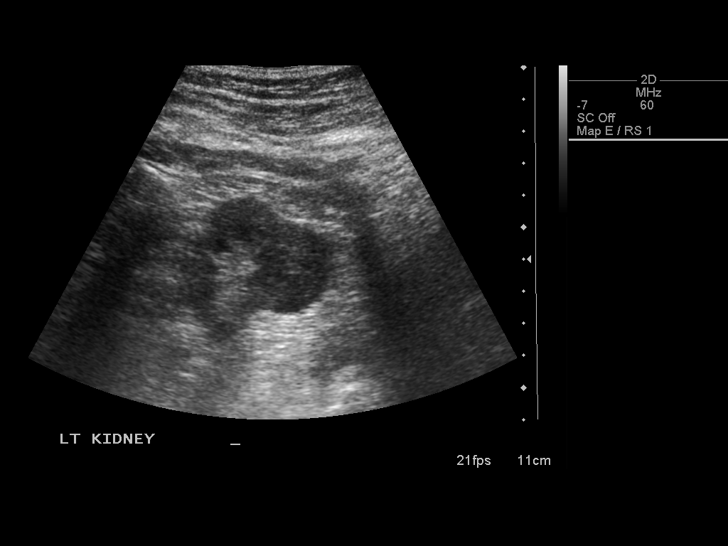
[im 8/15]
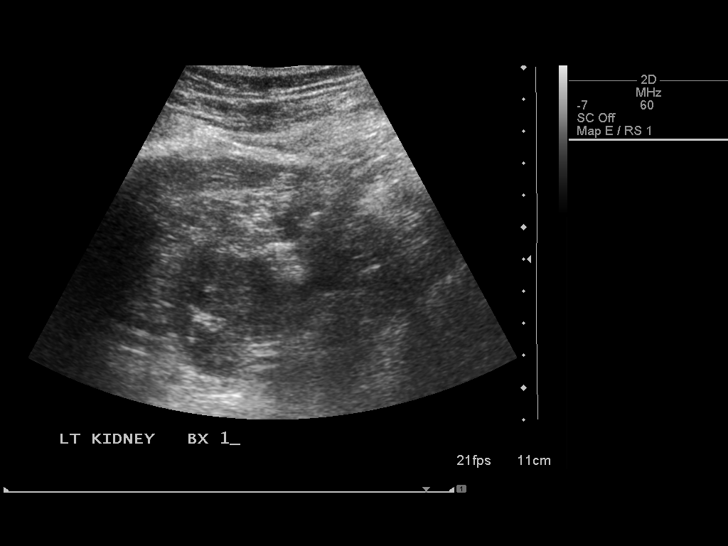
[im 9/15]
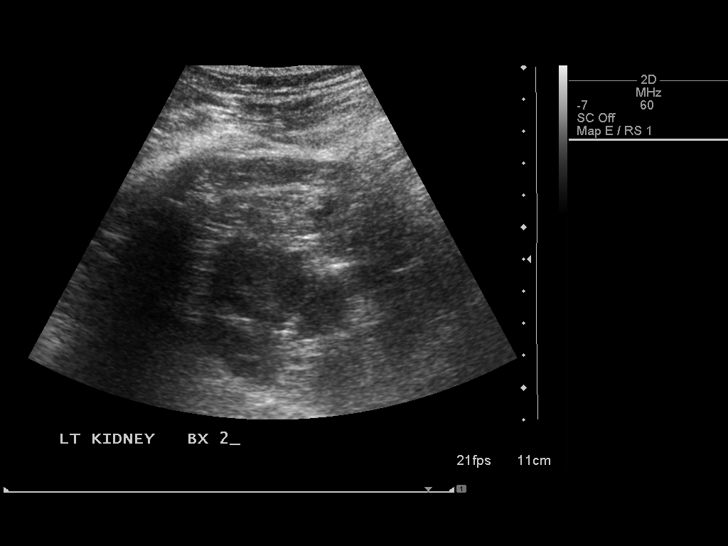
[im 10/15]
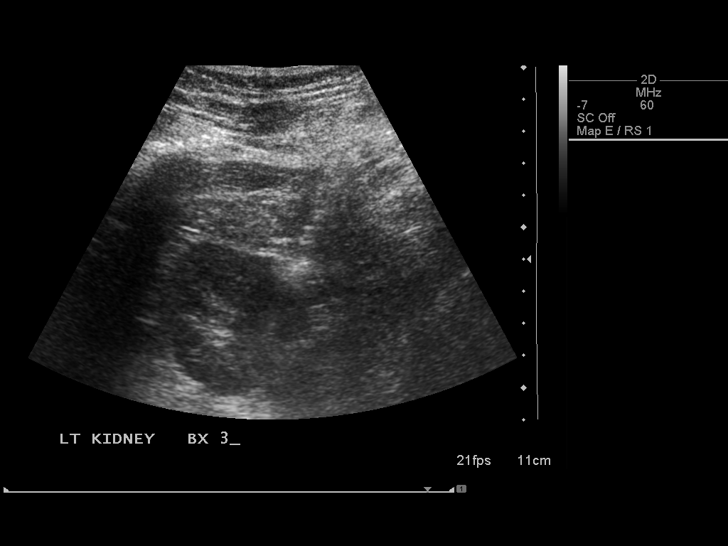
[im 11/15]
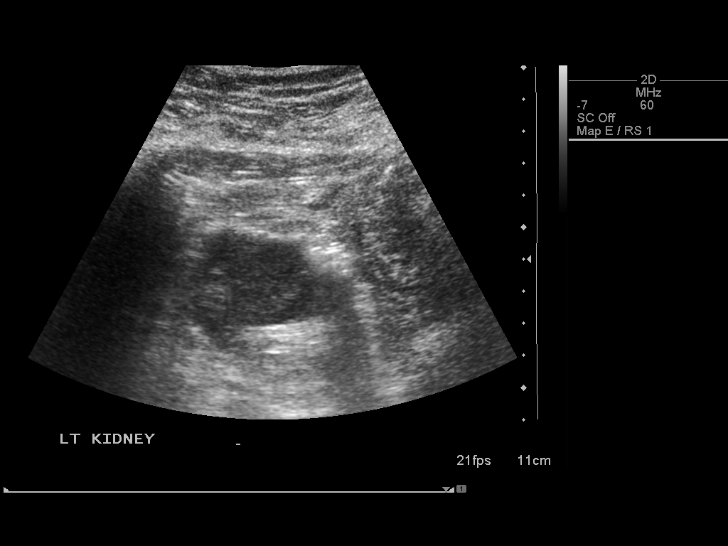
[im 13/15]
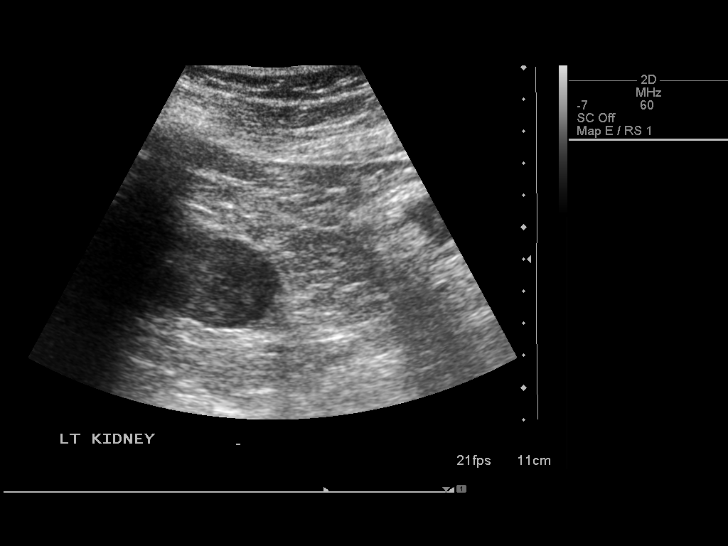
[im 14/15]
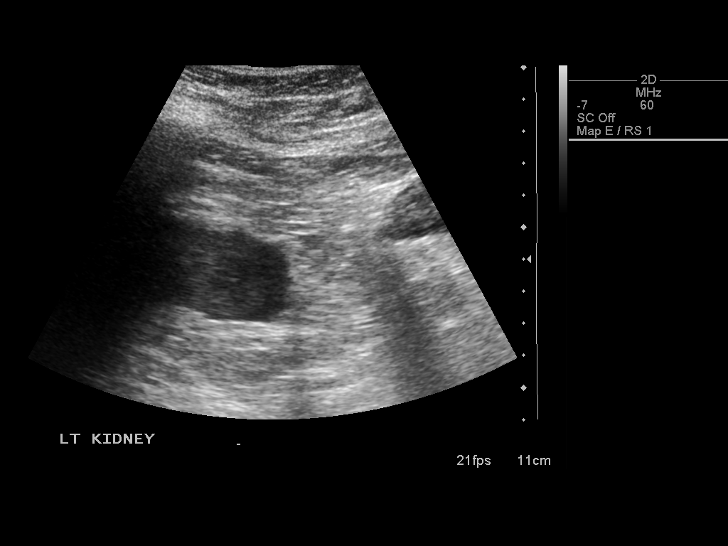
[im 15/15]
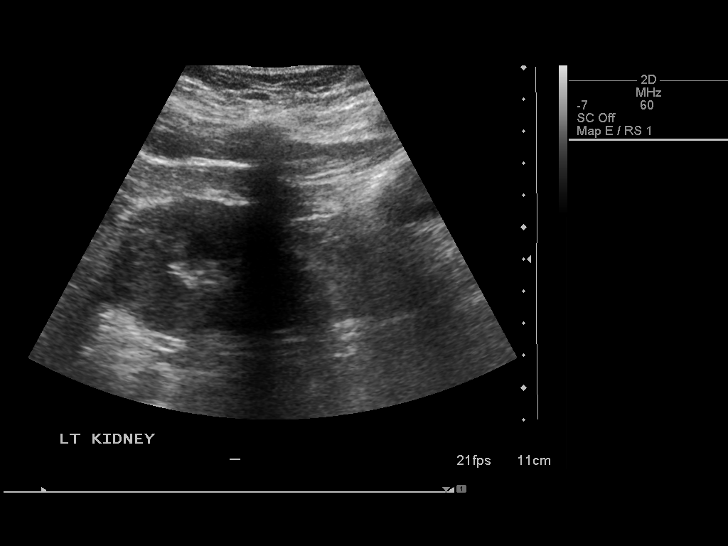

[13 of 15 positions shown; findings below may reference images not displayed]

ULTRASOUND GUIDED CORE BIOPSY OF LEFT RENAL MASS

Sedation:  2.0 mg IV Versed;  100 mcg IV Fentanyl

Total Moderate Sedation Time: 30 minutes.

Procedure:  The procedure, risks, benefits, and alternatives were
explained to the patient.  Questions regarding the procedure were
encouraged and answered.  The patient understands and consents to
the procedure.

The left flank region was prepped with Betadine in a sterile
fashion, and a sterile drape was applied covering the operative
field.  A sterile gown and sterile gloves were used for the
procedure. Local anesthesia was provided with 1% Lidocaine.

The left renal mass was localized by ultrasound.  Under direct
ultrasound guidance, a 17 gauge trocar needle was advanced to the
edge of the mass.  Core biopsy was performed with an 18 gauge
device.  One core sample was submitted in formalin and three
additional core samples submitted in saline.  Postbiopsy imaging
was performed.

Complications: None
FINDINGS: The left renal mass emanating from the medial lower pole
was very well visualized by ultrasound and is relatively hypoechoic
compared to the adjacent renal cortex.  This mass is elongated in
shape and measures 2.6 cm in greatest diameter.  This correlates
with maximal dimension on the coronal sequences obtained after
contrast restriction on the prior MRI study.  Solid tissue was
obtained from the lesion.  There were no immediate bleeding
complications.
IMPRESSION: Ultrasound guided core biopsy of the lower pole left renal mass.
Solid tissue was obtained.  As above, actual maximal lesion
dimensions are 2.6 cm with the lesion being elongated in shape.

## 2013-02-10 DIAGNOSIS — K5289 Other specified noninfective gastroenteritis and colitis: Secondary | ICD-10-CM | POA: Diagnosis not present

## 2013-02-11 ENCOUNTER — Other Ambulatory Visit: Payer: Self-pay | Admitting: Family Medicine

## 2013-02-11 DIAGNOSIS — N6489 Other specified disorders of breast: Secondary | ICD-10-CM

## 2013-02-12 DIAGNOSIS — R197 Diarrhea, unspecified: Secondary | ICD-10-CM | POA: Diagnosis not present

## 2013-02-17 DIAGNOSIS — R079 Chest pain, unspecified: Secondary | ICD-10-CM | POA: Diagnosis not present

## 2013-02-17 DIAGNOSIS — K219 Gastro-esophageal reflux disease without esophagitis: Secondary | ICD-10-CM | POA: Diagnosis not present

## 2013-02-17 DIAGNOSIS — K5289 Other specified noninfective gastroenteritis and colitis: Secondary | ICD-10-CM | POA: Diagnosis not present

## 2013-03-03 ENCOUNTER — Encounter (HOSPITAL_COMMUNITY): Payer: Medicare Other | Attending: Oncology

## 2013-03-03 DIAGNOSIS — C8590 Non-Hodgkin lymphoma, unspecified, unspecified site: Secondary | ICD-10-CM

## 2013-03-03 DIAGNOSIS — L988 Other specified disorders of the skin and subcutaneous tissue: Secondary | ICD-10-CM | POA: Diagnosis not present

## 2013-03-03 DIAGNOSIS — C8589 Other specified types of non-Hodgkin lymphoma, extranodal and solid organ sites: Secondary | ICD-10-CM | POA: Diagnosis not present

## 2013-03-03 LAB — CBC WITH DIFFERENTIAL/PLATELET
Basophils Absolute: 0.1 10*3/uL (ref 0.0–0.1)
Basophils Relative: 1 % (ref 0–1)
Eosinophils Absolute: 0.2 10*3/uL (ref 0.0–0.7)
Eosinophils Relative: 5 % (ref 0–5)
Lymphs Abs: 1 10*3/uL (ref 0.7–4.0)
MCH: 28.4 pg (ref 26.0–34.0)
MCHC: 34.1 g/dL (ref 30.0–36.0)
Neutrophils Relative %: 57 % (ref 43–77)
Platelets: 198 10*3/uL (ref 150–400)
RBC: 4.43 MIL/uL (ref 3.87–5.11)
RDW: 13.6 % (ref 11.5–15.5)

## 2013-03-03 LAB — COMPREHENSIVE METABOLIC PANEL
ALT: 17 U/L (ref 0–35)
AST: 24 U/L (ref 0–37)
Albumin: 4.1 g/dL (ref 3.5–5.2)
Alkaline Phosphatase: 72 U/L (ref 39–117)
Calcium: 9.4 mg/dL (ref 8.4–10.5)
Potassium: 4 mEq/L (ref 3.5–5.1)
Sodium: 140 mEq/L (ref 135–145)
Total Protein: 7 g/dL (ref 6.0–8.3)

## 2013-03-03 NOTE — Progress Notes (Signed)
Labs drawn today for cbc/diff,cmp,ldh,sed rate 

## 2013-03-04 ENCOUNTER — Ambulatory Visit
Admission: RE | Admit: 2013-03-04 | Discharge: 2013-03-04 | Disposition: A | Discharge status: Home / Self Care | Payer: Medicare Other | Source: Ambulatory Visit | Attending: Family Medicine | Admitting: Family Medicine

## 2013-03-04 DIAGNOSIS — N6489 Other specified disorders of breast: Secondary | ICD-10-CM

## 2013-03-04 DIAGNOSIS — N63 Unspecified lump in unspecified breast: Secondary | ICD-10-CM | POA: Diagnosis not present

## 2013-03-04 DIAGNOSIS — M1710 Unilateral primary osteoarthritis, unspecified knee: Secondary | ICD-10-CM | POA: Diagnosis not present

## 2013-03-04 MED ORDER — GADOBENATE DIMEGLUMINE 529 MG/ML IV SOLN
17.0000 mL | Freq: Once | INTRAVENOUS | Status: AC | PRN
  Administered 2013-03-04: 17 mL via INTRAVENOUS

## 2013-03-05 ENCOUNTER — Encounter (HOSPITAL_COMMUNITY): Payer: Self-pay | Admitting: Oncology

## 2013-03-05 ENCOUNTER — Other Ambulatory Visit: Payer: Self-pay | Admitting: Diagnostic Radiology

## 2013-03-05 ENCOUNTER — Encounter (HOSPITAL_BASED_OUTPATIENT_CLINIC_OR_DEPARTMENT_OTHER): Payer: Medicare Other | Admitting: Oncology

## 2013-03-05 VITALS — BP 117/75 | HR 68 | Temp 98.5°F | Resp 16 | Wt 179.8 lb

## 2013-03-05 DIAGNOSIS — C8299 Follicular lymphoma, unspecified, extranodal and solid organ sites: Secondary | ICD-10-CM

## 2013-03-05 DIAGNOSIS — C8590 Non-Hodgkin lymphoma, unspecified, unspecified site: Secondary | ICD-10-CM

## 2013-03-05 NOTE — Progress Notes (Signed)
#  1 low-grade non-Hodgkin's lymphoma, CD20 positive, B-cell, follicular type. She had kidney biopsy in 09/20/2011 showing atypical lymphoid proliferation changes and a left neck lymph node biopsy and subclavicular lymph node fossa biopsy also on the left showing the above-mentioned lymphoma. She however remains free of B. symptomatology. She has no other symptoms referable to this lymphoma and therefore does not need further therapy consideration at this time.  #2 leukoplakia-like changes of the right labia that were biopsied and are not malignant #3 benign skin changes #4 thyroid lesion also benign status post biopsy March 2014  We are awaiting the results of her MRIs which were done of her breast tissue because of asymmetry on the right. She has had surgery on both breasts in the past for benign lesions.  She is however asymptomatic from the lymphoma standpoint. She does get anxious before coming here on each visit. Her blood work the other day his very very stable.  Her physical exam shows that she has no obvious lymphadenopathy that is new or different. She has a single lymph node posterior cervical chain 5 or 6 mm across. She has no other nodes. She has a sebaceous cyst left axilla. It is approximately a centimeter across. She has negative breast exam for masses. Abdomen shows no hepatosplenomegaly. She is no ascites. Bowel sounds are normal. Heart shows a regular rhythm and rate without murmur rub or gallop. She has no peripheral edema. Lungs are clear. Skin exam presently is unremarkable.  She is doing very well. I reassured her and her husband. There is no need for intervention presently. We'll see her back in 4 months.

## 2013-03-05 NOTE — Patient Instructions (Addendum)
.  [facility] Discharge Instructions  RECOMMENDATIONS MADE BY THE CONSULTANT AND ANY TEST RESULTS WILL BE SENT TO YOUR REFERRING PHYSICIAN.  EXAM FINDINGS BY THE PHYSICIAN TODAY AND SIGNS OR SYMPTOMS TO REPORT TO CLINIC OR PRIMARY PHYSICIAN: Exam is good MRI has not been read yet.  There are a few small lymph nodes- nothing to worry about   INSTRUCTIONS GIVEN AND DISCUSSED: Stay active  SPECIAL INSTRUCTIONS/FOLLOW-UP: Labs in 4 months and to see the Dr.    Danae Chen you for choosing [facility] to provide your oncology and hematology care.  To afford each patient quality time with our providers, please arrive at least 15 minutes before your scheduled appointment time.  With your help, our goal is to use those 15 minutes to complete the necessary work-up to ensure our physicians have the information they need to help with your evaluation and healthcare recommendations.    Effective January 1st, 2014, we ask that you re-schedule your appointment with our physicians should you arrive 10 or more minutes late for your appointment.  We strive to give you quality time with our providers, and arriving late affects you and other patients whose appointments are after yours.    Again, thank you for choosing [facility].  Our hope is that these requests will decrease the amount of time that you wait before being seen by our physicians.       _____________________________________________________________  Should you have questions after your visit to [facility], please contact our office at [phone] between the hours of 8:30 a.m. and 5:00 p.m.  Voicemails left after 4:30 p.m. will not be returned until the following business day.  For prescription refill requests, have your pharmacy contact our office with your prescription refill request.

## 2013-03-06 ENCOUNTER — Other Ambulatory Visit: Payer: Self-pay | Admitting: Family Medicine

## 2013-03-06 DIAGNOSIS — R928 Other abnormal and inconclusive findings on diagnostic imaging of breast: Secondary | ICD-10-CM

## 2013-03-07 ENCOUNTER — Telehealth (HOSPITAL_COMMUNITY): Payer: Self-pay

## 2013-03-07 NOTE — Telephone Encounter (Signed)
Call from patient - stated "they called [facility] from [facility] and wanted me to come back and get biopsies done on both of my breast.  I told them that I would need to talk with Dr. Mariel Sleet to see if he thinks I need to do that"  Can be reached at home [phone] or cell [phone].

## 2013-03-07 NOTE — Telephone Encounter (Signed)
Per Dr. Mariel Sleet, patient notified that he recommends that she proceed with the biopsies of the abnormal areas in her breast to determine exactly what they are.  Stated she would schedule it when the breast center contacts her on Monday.

## 2013-03-25 ENCOUNTER — Other Ambulatory Visit: Payer: Medicare Other

## 2013-03-28 ENCOUNTER — Ambulatory Visit
Admission: RE | Admit: 2013-03-28 | Discharge: 2013-03-28 | Disposition: A | Discharge status: Home / Self Care | Payer: Medicare Other | Source: Ambulatory Visit | Attending: Family Medicine | Admitting: Family Medicine

## 2013-03-28 DIAGNOSIS — R928 Other abnormal and inconclusive findings on diagnostic imaging of breast: Secondary | ICD-10-CM

## 2013-03-28 DIAGNOSIS — C8589 Other specified types of non-Hodgkin lymphoma, extranodal and solid organ sites: Secondary | ICD-10-CM | POA: Diagnosis not present

## 2013-03-28 DIAGNOSIS — N63 Unspecified lump in unspecified breast: Secondary | ICD-10-CM | POA: Diagnosis not present

## 2013-03-28 MED ORDER — GADOBENATE DIMEGLUMINE 529 MG/ML IV SOLN
17.0000 mL | Freq: Once | INTRAVENOUS | Status: AC | PRN
  Administered 2013-03-28: 17 mL via INTRAVENOUS

## 2013-04-08 ENCOUNTER — Encounter (HOSPITAL_COMMUNITY): Payer: Self-pay

## 2013-04-08 ENCOUNTER — Encounter (HOSPITAL_COMMUNITY): Payer: Medicare Other | Attending: Oncology

## 2013-04-08 VITALS — BP 128/89 | HR 82 | Temp 98.2°F | Resp 16 | Ht 62.0 in | Wt 182.5 lb

## 2013-04-08 DIAGNOSIS — C8590 Non-Hodgkin lymphoma, unspecified, unspecified site: Secondary | ICD-10-CM

## 2013-04-08 DIAGNOSIS — C8589 Other specified types of non-Hodgkin lymphoma, extranodal and solid organ sites: Secondary | ICD-10-CM | POA: Diagnosis not present

## 2013-04-08 LAB — CBC WITH DIFFERENTIAL/PLATELET
Eosinophils Absolute: 0.1 10*3/uL (ref 0.0–0.7)
Hemoglobin: 13.4 g/dL (ref 12.0–15.0)
Lymphs Abs: 0.9 10*3/uL (ref 0.7–4.0)
MCH: 28.6 pg (ref 26.0–34.0)
Monocytes Relative: 8 % (ref 3–12)
Neutro Abs: 2.5 10*3/uL (ref 1.7–7.7)
Neutrophils Relative %: 64 % (ref 43–77)
RBC: 4.68 MIL/uL (ref 3.87–5.11)

## 2013-04-08 LAB — COMPREHENSIVE METABOLIC PANEL
Alkaline Phosphatase: 79 U/L (ref 39–117)
BUN: 21 mg/dL (ref 6–23)
Chloride: 101 mEq/L (ref 96–112)
GFR calc Af Amer: 69 mL/min — ABNORMAL LOW (ref >90)
GFR calc non Af Amer: 60 mL/min — ABNORMAL LOW (ref >90)
Glucose, Bld: 92 mg/dL (ref 70–99)
Potassium: 3.8 mEq/L (ref 3.5–5.1)
Total Bilirubin: 0.4 mg/dL (ref 0.3–1.2)

## 2013-04-08 NOTE — Progress Notes (Signed)
[facility] [phone]   Fax:([phone]  OFFICE PROGRESS NOTE  [name], MD 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  INTERVAL HISTORY: Ms. 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  Also biopsy of the left neck and supraclavicular lymph node were reported to confirm the above.  Patient was  believed to be free of B. Symptoms and thus being followed routinely clinic. She has not had any previous bone marrow biopsy.  Her last PET scan on 11/02/2011 showed multiple areas of FDG uptake in the left kidney, retroperitoneal lymph nodes , parotid gland, right axilla mesentery and right lower liver.  Subcutaneous nodules that were mildly hypermetabolic we are also seen at that time including 2 lesions  deep within the left breast. Patient had a series of imaging scans including mammogram of the breast and ultrasound recently and a  report dated 03/04/2013 breast MRI showed bilateral lower lymphadenopathy. She went on to have a biopsy of the right breast core at the 11:00 position which showed  non-Hodgkin B-cell lymphoma and left breast needle core biopsy at 4:00 which also showed non-Hodgkin'sB-cell lymphoma. Immunostains performed showed positively with CD20 and CD79a in addition to positivity for CD10,BCL-6 and BCL-2. CD21 highlights the prominent dendritic networks present throughout. CD3 and CD43highlight the minor T cell component present in the background. Ki-67 shows variable increase ranging from 10 to over 50% in some of the follicular centers. The overall histologic and phenotype features were similar to previous biopsy and believed to be  consistent with Non-Hodgkin's B cell lymphoma, follicular center cell type according to  the pathology report.  Patient was then sent today to discuss these findings further. She tells me that except for fatigue which is she has always had she does not have any B. Symptom of fever or weight loss or night sweats.  She is eating well and her weight is stable.  She was accompanied by her husband.       MEDICAL HISTORY: Past Medical History  Diagnosis Date  . CAD (coronary artery disease)   . GERD (gastroesophageal reflux disease)   . HTN (hypertension)   . Hyperlipidemia   . Enlargement of lymph nodes 07/26/2011  . 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 Lymphoma   . Tick bite of knee   . Thyroid disease     ALLERGIES:  is allergic to furosemide and latex.  MEDICATIONS:  Current Outpatient Prescriptions  Medication Sig Dispense Refill  . ALPRAZolam (XANAX) 0.25 MG tablet Take 0.25 mg by mouth at bedtime as needed for sleep. Takes 1/2 tablet when needed for sleep.      Marland Kitchen aspirin 81 MG tablet Take 81 mg by mouth at bedtime.       . Calcium-Magnesium-Zinc (CAL-MAG-ZINC PO) Take 1 tablet by mouth 2 (two) times daily.        . Cholecalciferol (VITAMIN D) 2000 UNITS tablet Take 2,000 Units by mouth daily.      . clobetasol cream (TEMOVATE) 0.05 % Apply 1 application topically 2 (two) times daily. Apply to affected area      . ibuprofen (ADVIL,MOTRIN) 800 MG tablet Take 800 mg by mouth every 6 (six) hours as needed. For pain      .  lansoprazole (PREVACID) 30 MG capsule Take 30 mg by mouth daily.       Marland Kitchen loratadine (CLARITIN) 10 MG tablet Take 10 mg by mouth daily.       Marland Kitchen losartan-hydrochlorothiazide (HYZAAR) 100-12.5 MG per tablet Take 1 tablet by mouth every evening.       . Omega-3 Fatty Acids (FISH OIL) 1200 MG CAPS Take 1 capsule by mouth 2 (two) times daily.        . polyvinyl alcohol (LIQUIFILM TEARS) 1.4 % ophthalmic solution Place 1 drop into both eyes as needed. For dry eyes      . potassium chloride SA (K-DUR,KLOR-CON) 20 MEQ tablet Take 20  mEq by mouth daily.       . Probiotic Product (PROBIOTIC DAILY PO) Take 1 capsule by mouth daily.      Marland Kitchen pyridOXINE (VITAMIN B-6) 100 MG tablet Take 100 mg by mouth daily.       . Red Yeast Rice 600 MG CAPS Take 1 capsule by mouth every evening.       . vitamin C (ASCORBIC ACID) 500 MG tablet Take 500 mg by mouth daily.       No current facility-administered medications for this visit.    SURGICAL HISTORY:  Past Surgical History  Procedure Laterality Date  . Mr breast bilateral  cyst removal  . Abdominal hysterectomy    . Lymph node dissection  x 3 different times  . Tubal ligation    . Knee arthroscopy w/ debridement  05/2011    right     REVIEW OF SYSTEMS: 14 point review of system is as in the history above otherwise negative.  PHYSICAL EXAMINATION:  Blood pressure 128/89, pulse 82, temperature 98.2 F (36.8 C), temperature source Oral, resp. rate 16, height 5\' 2"  (1.575 m), weight 182 lb 8 oz (82.781 kg). GENERAL: No acute distress. SKIN:  No rashes or significant lesions . No ecchymosis or petechial rash. HEAD: Normocephalic, No masses, lesions, tenderness or abnormalities  EYES: Conjunctiva are pink and non-injected and no jaundice ENT: External ears normal ,lips, buccal mucosa, and tongue normal and mucous membranes are moist . No evidence of thrush. LYMPH: No palpable lymphadenopathy, in the neck, supraclavicular areas or axilla or inguinal region. BREAST:Normal without any palpable mass on both breasts .  Biopsy needle marks noted. LUNGS: Clear to auscultation , no crackles or wheezes HEART: regular rate & rhythm, no murmurs, no gallops, S1 normal and S2 normal and no S3. ABDOMEN: Abdomen soft, non-tender, no masses or organomegaly and no hepatosplenomegaly palpable. EXTREMITIES: No edema, no skin discoloration or tenderness NEURO: Alert & oriented , no focal motor  deficits.     LABORATORY DATA: Lab Results  Component Value Date   WBC 3.7* 03/03/2013   HGB 12.6  03/03/2013   HCT 36.9 03/03/2013   MCV 83.3 03/03/2013   PLT 198 03/03/2013      Chemistry      Component Value Date/Time   NA 140 03/03/2013 0923   K 4.0 03/03/2013 0923   CL 101 03/03/2013 0923   CO2 29 03/03/2013 0923   BUN 16 03/03/2013 0923   CREATININE 1.00 03/03/2013 0923      Component Value Date/Time   CALCIUM 9.4 03/03/2013 0923   ALKPHOS 72 03/03/2013 0923   AST 24 03/03/2013 0923   ALT 17 03/03/2013 0923   BILITOT 0.6 03/03/2013 1610       ASSESSMENT:  Ms. Eckmann has follicular B-cell  lymphoma, grade and stage indeterminate  at this time.  Her breast biopsy is consistent with lymphomatous involvement.  She does not have any B. Symptoms at this time except for fatigue which is not clear if this is coming from inactivity or lymphoma.  I have personally  reviewed her previous the PET/CT scan images, however this has been more than a year ago.  At this time I discussed with the patient that repeat imaging would be useful to see if she meets any of the GELF criteria for treatment of her lymphoma.  I also discussed bone marrow aspiration and biopsy with her and her husband which will be very helpful in staging her disease. I had a very long discussion with the patient and her husband and they had multiple questions regarding her diagnosis, progression, treatment options and prognosis which we discussed in minute details . We agreed to following plan below  PLAN:  1. At the end we agreed on a PET CT scan which was ordered. 2. She'll return to clinic in 1 month, based on the data from the PET/CT scan I will  decide if she meets criteria for treatment , the she will consider bone marrow aspiration and biopsy if indicated. If she does meets criteria for treatment Bendamustine /Rituxan will be my first choice. 3. CBC/ CMP/ hepatitis B / beta 2 microglobulin and LDH was ordered for today.     All questions were satisfactorily answered. Patient knows to call if  any concern arises.  I spent  more than 50 % counseling the patient face to face. The total time spent in the appointment was 60 minutes.   Sherral Hammers, MD FACP. Hematology/Oncology.

## 2013-04-08 NOTE — Progress Notes (Signed)
[name] presented for labwork. Labs per MD order drawn via Peripheral Line 23 gauge needle inserted in right AC  Good blood return present. Procedure without incident.  Needle removed intact. Patient tolerated procedure well.

## 2013-04-08 NOTE — Patient Instructions (Addendum)
[facility] Discharge Instructions  RECOMMENDATIONS MADE BY THE CONSULTANT AND ANY TEST RESULTS WILL BE SENT TO YOUR REFERRING PHYSICIAN.  EXAM FINDINGS BY THE PHYSICIAN TODAY AND SIGNS OR SYMPTOMS TO REPORT TO CLINIC OR PRIMARY PHYSICIAN: Exam and discussion by Dr. Sharia Reeve. Would like to do a PET scan and Bone Marrow Biopsy and Aspiration to determine the status of your disease. Once we get the test results we can determine whether or not you need treatment.  We will check some additional blood work today.  MEDICATIONS PRESCRIBED:  none  INSTRUCTIONS GIVEN AND DISCUSSED: PET scan - nothing to eat or drink 6 hours prior.  No chewing gum, candy or anything with sugar in it.  SPECIAL INSTRUCTIONS/FOLLOW-UP: Follow-up after all results back.  Thank you for choosing [facility] to provide your oncology and hematology care.  To afford each patient quality time with our providers, please arrive at least 15 minutes before your scheduled appointment time.  With your help, our goal is to use those 15 minutes to complete the necessary work-up to ensure our physicians have the information they need to help with your evaluation and healthcare recommendations.    Effective January 1st, 2014, we ask that you re-schedule your appointment with our physicians should you arrive 10 or more minutes late for your appointment.  We strive to give you quality time with our providers, and arriving late affects you and other patients whose appointments are after yours.    Again, thank you for choosing [facility].  Our hope is that these requests will decrease the amount of time that you wait before being seen by our physicians.       _____________________________________________________________  Should you have questions after your visit to [facility], please contact our office at [phone] between the hours of 8:30 a.m. and 5:00 p.m.  Voicemails left  after 4:30 p.m. will not be returned until the following business day.  For prescription refill requests, have your pharmacy contact our office with your prescription refill request.

## 2013-04-09 LAB — BETA 2 MICROGLOBULIN, SERUM: Beta-2 Microglobulin: 1.61 mg/L (ref 1.01–1.73)

## 2013-04-15 ENCOUNTER — Encounter (HOSPITAL_COMMUNITY)
Admission: RE | Admit: 2013-04-15 | Discharge: 2013-04-15 | Disposition: A | Discharge status: Home / Self Care | Payer: Medicare Other | Source: Ambulatory Visit | Attending: Hematology and Oncology | Admitting: Hematology and Oncology

## 2013-04-15 DIAGNOSIS — C8589 Other specified types of non-Hodgkin lymphoma, extranodal and solid organ sites: Secondary | ICD-10-CM | POA: Insufficient documentation

## 2013-04-15 DIAGNOSIS — R229 Localized swelling, mass and lump, unspecified: Secondary | ICD-10-CM | POA: Diagnosis not present

## 2013-04-15 DIAGNOSIS — C8590 Non-Hodgkin lymphoma, unspecified, unspecified site: Secondary | ICD-10-CM

## 2013-04-15 MED ORDER — FLUDEOXYGLUCOSE F - 18 (FDG) INJECTION
20.5000 | Freq: Once | INTRAVENOUS | Status: AC | PRN
  Administered 2013-04-15: 20.5 via INTRAVENOUS

## 2013-04-22 DIAGNOSIS — Q828 Other specified congenital malformations of skin: Secondary | ICD-10-CM | POA: Diagnosis not present

## 2013-04-28 ENCOUNTER — Ambulatory Visit (HOSPITAL_COMMUNITY): Payer: Medicare Other

## 2013-05-05 ENCOUNTER — Encounter (HOSPITAL_COMMUNITY): Payer: Self-pay

## 2013-05-05 ENCOUNTER — Ambulatory Visit (HOSPITAL_COMMUNITY): Payer: Medicare Other

## 2013-05-05 ENCOUNTER — Encounter (HOSPITAL_COMMUNITY): Payer: Medicare Other | Attending: Hematology and Oncology

## 2013-05-05 VITALS — BP 140/80 | HR 76 | Temp 98.3°F | Resp 16 | Wt 185.4 lb

## 2013-05-05 DIAGNOSIS — C8590 Non-Hodgkin lymphoma, unspecified, unspecified site: Secondary | ICD-10-CM

## 2013-05-05 DIAGNOSIS — C8298 Follicular lymphoma, unspecified, lymph nodes of multiple sites: Secondary | ICD-10-CM

## 2013-05-05 DIAGNOSIS — I2510 Atherosclerotic heart disease of native coronary artery without angina pectoris: Secondary | ICD-10-CM | POA: Diagnosis not present

## 2013-05-05 DIAGNOSIS — C8589 Other specified types of non-Hodgkin lymphoma, extranodal and solid organ sites: Secondary | ICD-10-CM | POA: Insufficient documentation

## 2013-05-05 DIAGNOSIS — I10 Essential (primary) hypertension: Secondary | ICD-10-CM

## 2013-05-05 NOTE — Patient Instructions (Addendum)
[facility] Discharge Instructions  RECOMMENDATIONS MADE BY THE CONSULTANT AND ANY TEST RESULTS WILL BE SENT TO YOUR REFERRING PHYSICIAN.  EXAM FINDINGS BY THE PHYSICIAN TODAY AND SIGNS OR SYMPTOMS TO REPORT TO CLINIC OR PRIMARY PHYSICIAN: Exam and discussion by Dr. Lazarus Salines. Will do bone marrow biopsy and aspiration to see if there is involvement in your bone marrow.  MEDICATIONS PRESCRIBED:  none  INSTRUCTIONS GIVEN AND DISCUSSED: Will see you back after bone marrow to discuss recommendations.  SPECIAL INSTRUCTIONS/FOLLOW-UP: Bone Marrow Biopsy and Aspiration on 05/14/13 at 8:30 am.  Follow-up will be 05/23/13.  Thank you for choosing [facility] to provide your oncology and hematology care.  To afford each patient quality time with our providers, please arrive at least 15 minutes before your scheduled appointment time.  With your help, our goal is to use those 15 minutes to complete the necessary work-up to ensure our physicians have the information they need to help with your evaluation and healthcare recommendations.    Effective January 1st, 2014, we ask that you re-schedule your appointment with our physicians should you arrive 10 or more minutes late for your appointment.  We strive to give you quality time with our providers, and arriving late affects you and other patients whose appointments are after yours.    Again, thank you for choosing [facility].  Our hope is that these requests will decrease the amount of time that you wait before being seen by our physicians.       _____________________________________________________________  Should you have questions after your visit to [facility], please contact our office at [phone] between the hours of 8:30 a.m. and 5:00 p.m.  Voicemails left after 4:30 p.m. will not be returned until the following business day.  For prescription refill requests, have your pharmacy contact  our office with your prescription refill request.

## 2013-05-05 NOTE — Progress Notes (Signed)
[facility] Telephone:([phone]   Fax:(336) [phone]  OFFICE PROGRESS NOTE  [name], MD 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   INTERVAL HISTORY:   Ms. 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 Also biopsy of the left neck and supraclavicular lymph node were reported to confirm the above. Patient was believed to be free of B. Symptoms and thus being followed routinely clinic. She has not had any previous bone marrow biopsy.  Her last PET scan on 11/02/2011 showed multiple areas of FDG uptake in the left kidney, retroperitoneal lymph nodes , parotid gland, right axilla mesentery and right lower liver. Subcutaneous nodules that were mildly hypermetabolic we are also seen at that time including 2 lesions deep within the left breast.  Patient had a series of imaging scans including mammogram of the breast and ultrasound recently and a report dated 03/04/2013 breast MRI showed bilateral lower lymphadenopathy.  She went on to have a biopsy of the right breast core at the 11:00 position which showed non-Hodgkin B-cell lymphoma and left breast needle core biopsy at 4:00 which also showed non-Hodgkin'sB-cell lymphoma.  Immunostains performed showed positively with CD20 and CD79a in addition to positivity for CD10,BCL-6 and BCL-2. CD21 highlights the prominent dendritic networks present throughout. CD3 and CD43highlight the minor T cell component present in the background. Ki-67 shows variable increase ranging from 10 to over 50% in some of the follicular centers. The overall histologic and phenotype features were  similar to previous biopsy and believed to be consistent with Non-Hodgkin's B cell lymphoma, follicular center cell type according to  the pathology report.  Following our last visit I had ordered a PET CT scan which is summarized below in the radiologic section. ANAIAH MCMANNIS 67 y.o. female returns to the clinic today for scheduled follow up and to discuss the results of the PET/CT scan.  She is accompanied by her husband.  She denies any new changes .  She did admit to some night sweats.  She denies shortness or breath . She also denied any unexplained fever or weight loss.  She believes that the lymph nodes are unchanged in size since her last visit.   MEDICAL HISTORY: Past Medical History  Diagnosis Date  . CAD (coronary artery disease)   . GERD (gastroesophageal reflux disease)   . HTN (hypertension)   . Hyperlipidemia   . Enlargement of lymph nodes 07/26/2011  . 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 Lymphoma   . Tick bite of knee   . Thyroid disease     SURGICAL HISTORY:  Past Surgical History  Procedure Laterality Date  . Mr breast bilateral  cyst removal  . Abdominal hysterectomy    . Lymph node dissection  x 3 different times  . Tubal ligation    . Knee arthroscopy w/ debridement  05/2011    right     REVIEW OF SYSTEMS:14 point review of system is as in the history above otherwise negative.  PHYSICAL EXAMINATION:  Blood pressure 140/80, pulse 76, temperature 98.3 F (36.8 C), temperature source Oral, resp. rate 16, weight 185 lb 6.4 oz (84.097 kg). GENERAL: No acute distress. SKIN:  No rashes or significant lesions . No ecchymosis  or petechial rash. HEAD: prominence of both parotid glands and the palpable lymph nodes around th jaw  Area. The Parotids are prominent especially on the left side. EYES: Conjunctiva are pink and non-injected and no jaundice ENT: External ears normal ,lips, buccal mucosa, and tongue normal and mucous membranes are moist . No evidence of thrush. LYMPH: palpable lymph nodes in the neck and jaw area. BREAST:Deferred. LUNGS: Clear to auscultation ,  no crackles or wheezes HEART: regular rate & rhythm, no murmurs, no gallops, S1 normal and S2 normal and no S3. ABDOMEN: Abdomen soft, non-tender, no masses or organomegaly and no hepatosplenomegaly palpable EXTREMITIES: No edema, no skin discoloration or tenderness NEURO: Alert & oriented , no focal motor  deficits.     LABORATORY DATA: Lab Results  Component Value Date   WBC 3.9* 04/08/2013   HGB 13.4 04/08/2013   HCT 39.3 04/08/2013   MCV 84.0 04/08/2013   PLT 202 04/08/2013      Chemistry      Component Value Date/Time   NA 139 04/08/2013 1620   K 3.8 04/08/2013 1620   CL 101 04/08/2013 1620   CO2 31 04/08/2013 1620   BUN 21 04/08/2013 1620   CREATININE 0.96 04/08/2013 1620      Component Value Date/Time   CALCIUM 9.9 04/08/2013 1620   ALKPHOS 79 04/08/2013 1620   AST 24 04/08/2013 1620   ALT 16 04/08/2013 1620   BILITOT 0.4 04/08/2013 1620       RADIOGRAPHIC STUDIES: Nm Pet Image Restag (ps) Skull Base To Thigh  04/15/2013   Neck: Bilateral abnormal salivary gland activity is present. Left  parotid gland activity and underlying nodularity observed with  maximum standard uptake value 16.9 (formerly 10.6).  Left station II hypermetabolic lymph nodes are relatively small but  with maximum standard uptake value of approximately 9.0  Chest: Hypermetabolic subcutaneous nodules suspicious for  lymphomatous involvement observed along the neck, chest, abdomen,  and pelvis increased in intensity from prior. Small index nodule  lateral to the left shoulder musculature on image 59 of the fused  PET CT data has a maximum standard uptake value of 5.7 (formerly  2.4)  Bilateral axillary and left subpectoral hypermetabolic lymph nodes  noted. Index right axillary lymph node measuring 1.3 cm in short  axis has a maximum standard uptake value of 8.0 nine (formerly  8.6).  Abdomen/Pelvis: Hepatic lesions similar to prior. Inferior right  hepatic lobe lesion maximum standard uptake value  15.3 (formerly  12.1). Left kidney lower pole mass maximum standard uptake value  20.1 (formerly 14.3). The left para-aortic lymph node maximum  standard uptake value 11.2, formerly 7.6.  Central mesenteric lesions are slightly subjectively less bulky,  but maintain a maximum standard uptake value of 8.8 (previously  10.9). Small hypermetabolic lesions along the anterior right psoas  muscle appears similar to prior.  Periumbilical subcutaneous cystic lesion is not hypermetabolic and  is increased in size from prior, query sebaceous cyst.  Skeleton: No focal hypermetabolic activity to suggest skeletal  metastasis.  IMPRESSION:  1. Aside from the mesenteric involvement which appears  subjectively less bulky and mildly reduced and maximum standard  uptake value, for the most part the standard uptake values and  involvement appears increased in the parotid glands, left neck  station II lymph nodes, scattered subcutaneous nodules, axillary  and subpectoral lymph nodes, liver, left kidney lower pole, and  retroperitoneum.   PET/CT imaging: I personally reviewed with patient and her husband.  ASSESSMENT:  Follicular lymphoma stage indeterminate at this time.  PET/CT scan shows worsening of disease and increased  disease burden point when compared to one year ago.  I think patient is a candidate for antineoplastic therapy giving her the burden of disease and some night sweats  Besides the apparent involvement of the parotid glands is also concerning.  In addition, patient has a mass close to the left kidney which could result in pressure effects if untreated. I had a very long discussion with the patient and her spouse and they had multiple questions which were answered . Patient did raise the issue of stem cell transplant. I told her that being chemotherapy nave  this discussion this premature and  Will be relevant if she has a very short duration of response or lack of satisfactory  response   to chemotherapy. Also progression on chemotherapy would make a case for this.  PLAN:  1. Schedule for bone marrow aspiration and biopsy. 2. We talked about treatment options which patient is undecided as to whether she wants to receive chemotherapy. 3. I will prefer  bendamustine/Rituxan based on published clinical trial results superiority  in terms of progression free survival and less toxicity in trial published in Lancet. 2013 Apr 6;381(9873):1184 by Rummel MJ et al. 4. Return to clinic one week after bone marrow biopsy.    All questions were satisfactorily answered. Patient knows to call if  any concern arises.  I spent more than 50 % counseling the patient face to face. The total time spent in the appointment was 40 minutes.   Sherral Hammers, MD FACP. Hematology/Oncology.

## 2013-05-06 ENCOUNTER — Other Ambulatory Visit: Payer: Self-pay | Admitting: Radiology

## 2013-05-07 ENCOUNTER — Encounter (HOSPITAL_COMMUNITY): Payer: Self-pay | Admitting: Pharmacy Technician

## 2013-05-07 ENCOUNTER — Ambulatory Visit (HOSPITAL_COMMUNITY): Payer: Medicare Other

## 2013-05-09 ENCOUNTER — Ambulatory Visit (HOSPITAL_COMMUNITY)
Admission: RE | Admit: 2013-05-09 | Discharge: 2013-05-09 | Disposition: A | Discharge status: Home / Self Care | Payer: Medicare Other | Source: Ambulatory Visit | Attending: Oncology | Admitting: Oncology

## 2013-05-09 ENCOUNTER — Encounter (HOSPITAL_COMMUNITY): Payer: Self-pay

## 2013-05-09 DIAGNOSIS — I2510 Atherosclerotic heart disease of native coronary artery without angina pectoris: Secondary | ICD-10-CM | POA: Diagnosis not present

## 2013-05-09 DIAGNOSIS — K219 Gastro-esophageal reflux disease without esophagitis: Secondary | ICD-10-CM | POA: Insufficient documentation

## 2013-05-09 DIAGNOSIS — E785 Hyperlipidemia, unspecified: Secondary | ICD-10-CM | POA: Insufficient documentation

## 2013-05-09 DIAGNOSIS — C8589 Other specified types of non-Hodgkin lymphoma, extranodal and solid organ sites: Secondary | ICD-10-CM | POA: Diagnosis not present

## 2013-05-09 DIAGNOSIS — D72819 Decreased white blood cell count, unspecified: Secondary | ICD-10-CM | POA: Diagnosis not present

## 2013-05-09 DIAGNOSIS — C8590 Non-Hodgkin lymphoma, unspecified, unspecified site: Secondary | ICD-10-CM

## 2013-05-09 DIAGNOSIS — I10 Essential (primary) hypertension: Secondary | ICD-10-CM | POA: Insufficient documentation

## 2013-05-09 HISTORY — PX: BONE MARROW ASPIRATION: SHX1252

## 2013-05-09 HISTORY — PX: BONE MARROW BIOPSY: SHX199

## 2013-05-09 LAB — CBC
Hemoglobin: 13.7 g/dL (ref 12.0–15.0)
MCH: 28.2 pg (ref 26.0–34.0)
MCV: 82.3 fL (ref 78.0–100.0)
RBC: 4.85 MIL/uL (ref 3.87–5.11)

## 2013-05-09 LAB — BONE MARROW EXAM

## 2013-05-09 LAB — PROTIME-INR: Prothrombin Time: 12.2 seconds (ref 11.6–15.2)

## 2013-05-09 MED ORDER — MIDAZOLAM HCL 2 MG/2ML IJ SOLN
INTRAMUSCULAR | Status: AC
  Filled 2013-05-09: qty 4

## 2013-05-09 MED ORDER — HYDROCODONE-ACETAMINOPHEN 5-325 MG PO TABS: 1.0000 | ORAL_TABLET | ORAL | Status: DC | PRN

## 2013-05-09 MED ORDER — FENTANYL CITRATE 0.05 MG/ML IJ SOLN
INTRAMUSCULAR | Status: AC
  Filled 2013-05-09: qty 4

## 2013-05-09 MED ORDER — SODIUM CHLORIDE 0.9 % IV SOLN
INTRAVENOUS | Status: DC
  Administered 2013-05-09: 08:00:00 via INTRAVENOUS

## 2013-05-09 MED ORDER — MIDAZOLAM HCL 2 MG/2ML IJ SOLN
INTRAMUSCULAR | Status: AC | PRN
  Administered 2013-05-09: 2 mg via INTRAVENOUS

## 2013-05-09 MED ORDER — FENTANYL CITRATE 0.05 MG/ML IJ SOLN
INTRAMUSCULAR | Status: AC | PRN
  Administered 2013-05-09: 100 ug via INTRAVENOUS

## 2013-05-09 NOTE — H&P (Signed)
Chief Complaint: "I'm here for a bone marrow biopy" Referring Physician:Kefalas PA-C HPI: [name] is an 67 y.o. female with lymphoma. She is referred for staging BM biopsy. PMHx and meds reviewed. Pt feels ok otherwise, no recent illness, fevers.   Past Medical History:  Past Medical History  Diagnosis Date  . CAD (coronary artery disease)   . GERD (gastroesophageal reflux disease)   . HTN (hypertension)   . Hyperlipidemia   . Enlargement of lymph nodes 07/26/2011  . 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 Lymphoma   . Tick bite of knee   . Thyroid disease     Past Surgical History:  Past Surgical History  Procedure Laterality Date  . Mr breast bilateral  cyst removal  . Abdominal hysterectomy    . Lymph node dissection  x 3 different times    behind left ear 2012/ left clavical area  . Tubal ligation    . Knee arthroscopy w/ debridement  05/2011    right    Family History:  Family History  Problem Relation Age of Onset  . Cancer    . Stroke    . Heart disease    . Coronary artery disease    . Cancer Mother     breast  . Stroke Mother   . Cancer Father     pancreatic cancer  . Cancer Brother     kidney cancer/brain tumor    Social History:  reports that she has never smoked. She has never used smokeless tobacco. She reports that she does not drink alcohol or use illicit drugs.  Allergies:  Allergies  Allergen Reactions  . Furosemide Other (See Comments)    Makes her feel very weak  . Latex Swelling and Rash    Medications:   Medication List    ASK your doctor about these medications       ALPRAZolam 0.25 MG tablet  Commonly known as:  XANAX  Take 0.25 mg by mouth at bedtime as needed for Kennedy.     aspirin 81 MG tablet  Take 81 mg by mouth at bedtime.     CAL-MAG-ZINC PO  Take 1 tablet by mouth 2 (two) times daily.     carboxymethylcellulose 0.5 % Soln  Commonly known as:  REFRESH PLUS  Place 1 drop into both  eyes daily as needed (DRY EYES).     CLARITIN 10 MG tablet  Generic drug:  loratadine  Take 10 mg by mouth daily.     clobetasol cream 0.05 %  Commonly known as:  TEMOVATE  Apply 1 application topically 2 (two) times daily. Apply to affected area     Fish Oil 1200 MG Caps  Take 1 capsule by mouth 2 (two) times daily.     ibuprofen 800 MG tablet  Commonly known as:  ADVIL,MOTRIN  Take 800 mg by mouth every 6 (six) hours as needed. For pain     lansoprazole 30 MG capsule  Commonly known as:  PREVACID  Take 30 mg by mouth daily.     losartan-hydrochlorothiazide 100-12.5 MG per tablet  Commonly known as:  HYZAAR  Take 1 tablet by mouth every evening.     potassium chloride SA 20 MEQ tablet  Commonly known as:  K-DUR,KLOR-CON  Take 20 mEq by mouth daily.     PROBIOTIC DAILY PO  Take 1 capsule by mouth daily.     pyridOXINE 100 MG tablet  Commonly known as:  VITAMIN B-6  Take 100 mg by mouth daily.     Red Yeast Rice 600 MG Caps  Take 1 capsule by mouth every evening.     vitamin C 500 MG tablet  Commonly known as:  ASCORBIC ACID  Take 500 mg by mouth daily.     Vitamin D 2000 UNITS tablet  Take 2,000 Units by mouth daily.        Please HPI for pertinent positives, otherwise complete 10 system ROS negative.  Physical Exam: BP 134/80  Pulse 68  Temp(Src) 98.7 F (37.1 C) (Oral)  Resp 18  Ht 5\' 2"  (1.575 m)  Wt 185 lb (83.915 kg)  BMI 33.83 kg/m2  SpO2 100% Body mass index is 33.83 kg/(m^2).   General Appearance:  Alert, cooperative, no distress, appears stated age  Head:  Normocephalic, without obvious abnormality, atraumatic  ENT: Unremarkable  Neck: Supple, symmetrical, trachea midline  Lungs:   Clear to auscultation bilaterally, no w/r/r, respirations unlabored without use of accessory muscles.  Chest Wall:  No tenderness or deformity  Heart:  Regular rate and rhythm, S1, S2 normal, no murmur, rub or gallop.  Neurologic: Normal affect, no gross  deficits.   Results for orders placed during the hospital encounter of 05/09/13 (from the past 48 hour(s))  APTT     Status: None   Collection Time    05/09/13  7:20 AM      Result Value Range   aPTT 36  24 - 37 seconds  CBC     Status: Abnormal   Collection Time    05/09/13  7:20 AM      Result Value Range   WBC 3.0 (*) 4.0 - 10.5 K/uL   RBC 4.85  3.87 - 5.11 MIL/uL   Hemoglobin 13.7  12.0 - 15.0 g/dL   HCT 46.9  62.9 - 52.8 %   MCV 82.3  78.0 - 100.0 fL   MCH 28.2  26.0 - 34.0 pg   MCHC 34.3  30.0 - 36.0 g/dL   RDW 41.3  24.4 - 01.0 %   Platelets 195  150 - 400 K/uL  PROTIME-INR     Status: None   Collection Time    05/09/13  7:20 AM      Result Value Range   Prothrombin Time 12.2  11.6 - 15.2 seconds   INR 0.92  0.00 - 1.49   No results found.  Assessment/Plan Lymphoma CT guided BM biopsy Discussed procedure, risks, complications, use of sedation. Labs reviewed, ok Consent signed in chart  Brayton El PA-C 05/09/2013, 8:29 AM

## 2013-05-09 NOTE — Procedures (Signed)
CT R iliac bone marrow biopsy No complication No blood loss. See complete dictation in [facility] PACS.  

## 2013-05-14 ENCOUNTER — Encounter (HOSPITAL_COMMUNITY): Payer: Medicare Other

## 2013-05-23 ENCOUNTER — Encounter (HOSPITAL_COMMUNITY): Payer: Medicare Other | Attending: Oncology

## 2013-05-23 ENCOUNTER — Encounter (HOSPITAL_COMMUNITY): Payer: Self-pay

## 2013-05-23 VITALS — BP 155/85 | HR 66 | Temp 98.5°F | Resp 18 | Wt 183.5 lb

## 2013-05-23 DIAGNOSIS — C8298 Follicular lymphoma, unspecified, lymph nodes of multiple sites: Secondary | ICD-10-CM

## 2013-05-23 DIAGNOSIS — C8290 Follicular lymphoma, unspecified, unspecified site: Secondary | ICD-10-CM

## 2013-05-23 DIAGNOSIS — R599 Enlarged lymph nodes, unspecified: Secondary | ICD-10-CM | POA: Diagnosis not present

## 2013-05-23 DIAGNOSIS — C8589 Other specified types of non-Hodgkin lymphoma, extranodal and solid organ sites: Secondary | ICD-10-CM | POA: Insufficient documentation

## 2013-05-23 LAB — CHROMOSOME ANALYSIS, BONE MARROW

## 2013-05-23 NOTE — Patient Instructions (Signed)
[facility] Discharge Instructions  RECOMMENDATIONS MADE BY THE CONSULTANT AND ANY TEST RESULTS WILL BE SENT TO YOUR REFERRING PHYSICIAN.  EXAM FINDINGS BY THE PHYSICIAN TODAY AND SIGNS OR SYMPTOMS TO REPORT TO CLINIC OR PRIMARY PHYSICIAN: Exam findings as discussed by Dr. Sharia Reeve.  SPECIAL INSTRUCTIONS/FOLLOW-UP: 1.  We will refer you out to Dr. Truett Perna as discussed, since your want to transfer your care to [facility].  Please feel free to transfer your are back to [facility] should you want to in the future.  Thank you for choosing [facility] to provide your oncology and hematology care.  To afford each patient quality time with our providers, please arrive at least 15 minutes before your scheduled appointment time.  With your help, our goal is to use those 15 minutes to complete the necessary work-up to ensure our physicians have the information they need to help with your evaluation and healthcare recommendations.    Effective January 1st, 2014, we ask that you re-schedule your appointment with our physicians should you arrive 10 or more minutes late for your appointment.  We strive to give you quality time with our providers, and arriving late affects you and other patients whose appointments are after yours.    Again, thank you for choosing [facility].  Our hope is that these requests will decrease the amount of time that you wait before being seen by our physicians.       _____________________________________________________________  Should you have questions after your visit to [facility], please contact our office at [phone] between the hours of 8:30 a.m. and 5:00 p.m.  Voicemails left after 4:30 p.m. will not be returned until the following business day.  For prescription refill requests, have your pharmacy contact our office with your prescription refill request.

## 2013-05-23 NOTE — Progress Notes (Addendum)
[facility] [phone]   [phone]  OFFICE PROGRESS NOTE  Joyce Courier, MD [address] [place] [address] [place] [address]  DIAGNOSIS: Follicular lymphoma stage IV.  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 Also biopsy of the left neck and supraclavicular lymph node were reported to confirm the above. Patient was previously believed to be free of B. Symptoms and thus being followed routinely clinic.   Patient had a series of imaging scans including mammogram of the breast and ultrasound recently and a report dated 03/04/2013 breast MRI showed bilateral lower lymphadenopathy.  She went on to have a biopsy of the right breast core at the 11:00 position which showed non-Hodgkin B-cell lymphoma and left breast needle core biopsy at 4:00 which also showed non-Hodgkin'sB-cell lymphoma.  Immunostains performed showed positively with CD20 and CD79a in addition to positivity for CD10,BCL-6 and BCL-2. CD21 highlights the prominent dendritic networks present throughout. CD3 and CD43 highlight the minor T cell component present in the background. Ki-67 shows variable increase ranging from 10 to over 50% in some of the follicular centers. The overall histologic and phenotype features were  similar to previous biopsy and believed to be consistent with Non-Hodgkin's B cell lymphoma, follicular center cell type according to the pathology report.  She had a recent PET scan on 04/15/13 which showed multiple areas of involvement including left Parotid gland which had SUV of 16.9. Overall,PET/CT scan shows worsening of disease and increased disease burden point when compared to one year ago.  I had recommended treatment even night sweats and significant burden of disease and had  sent patient for bone marrow aspiration and biopsy which was done on 05/09/2013 and showed; SLIGHTLY HYPERCELLULAR BONE MARROW FOR AGE WITH TRILINEAGE HEMATOPOIESIS. A FEW MINUTE ATYPICAL LYMPHOID AGGREGATES PRESENT Flow cytometry showed 'Minor' B-cell population but clonality was difficult to determine.  She returns to the clinic today for scheduled follow up.   MEDICAL HISTORY: Reviewed, unchanged from last visit.  SURGICAL HISTORY: Review unchanged from last visit  REVIEW OF SYSTEMS: As the history above otherwise negative.  PHYSICAL EXAMINATION:  Blood pressure 155/85, pulse 66, temperature 98.5 F (36.9 C), temperature source Oral, resp. rate 18, weight 183 lb 8 oz (83.235 kg). GENERAL: No acute distress. NEURO: Alert & oriented  Full Physical exam differed.   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 Based on the bone marrow aspiration and biopsy I feel that the bone marrow is most likely involved.  This will make her stage  IV. Patient meets criteria for treatment.  My preference would be Bendamustine/Rituxan given its efficacy in follicular lymphoma and favorable toxicity profile compared to other options such as RCHOP  Or RCVP. Again had a very long discussion with them and the diagnosis, result of the bone marrow biopsy.  They have multiple questions which were all satisfactorily answered. Patient  prefers to receive her treatment in [place] at [facility].  PLAN:  1. Refer to [facility]; patient requests to Dr. Tora Duck. 2. She will [facility] only an as-needed basis.    Patient knows to call if  any concern arises.  I spent more than 50 % counseling the patient face to face. The total time spent in the appointment was 40 minutes.   Sherral Hammers, MD FACP. Hematology/Oncology.

## 2013-06-11 ENCOUNTER — Telehealth: Payer: Self-pay | Admitting: Hematology and Oncology

## 2013-06-11 NOTE — Telephone Encounter (Signed)
S/W PT AND GVE NP APPT 10/17 @ 3:30 W/DR. GORSUCH REFERRING DR. IKE DX-LYMPHOMA WELCOME PACKET MAILED.

## 2013-06-16 ENCOUNTER — Telehealth: Payer: Self-pay | Admitting: Hematology and Oncology

## 2013-06-16 NOTE — Telephone Encounter (Signed)
C/D 06/16/13 for appt. 06/27/13

## 2013-06-27 ENCOUNTER — Ambulatory Visit: Payer: Medicare Other

## 2013-06-27 ENCOUNTER — Encounter: Payer: Self-pay | Admitting: Hematology and Oncology

## 2013-06-27 ENCOUNTER — Ambulatory Visit (HOSPITAL_BASED_OUTPATIENT_CLINIC_OR_DEPARTMENT_OTHER): Payer: Medicare Other | Admitting: Hematology and Oncology

## 2013-06-27 ENCOUNTER — Telehealth: Payer: Self-pay | Admitting: Hematology and Oncology

## 2013-06-27 VITALS — BP 149/88 | HR 67 | Temp 97.3°F | Resp 18 | Ht 62.0 in | Wt 183.9 lb

## 2013-06-27 DIAGNOSIS — C8290 Follicular lymphoma, unspecified, unspecified site: Secondary | ICD-10-CM | POA: Insufficient documentation

## 2013-06-27 DIAGNOSIS — C8299 Follicular lymphoma, unspecified, extranodal and solid organ sites: Secondary | ICD-10-CM

## 2013-06-27 MED ORDER — ALLOPURINOL 300 MG PO TABS: 300.0000 mg | ORAL_TABLET | Freq: Every day | ORAL | Status: DC

## 2013-06-27 NOTE — Progress Notes (Signed)
[facility] CONSULT NOTE  Patient Care Team: Mirna Mires, MD as PCP - General (Family Medicine)  CHIEF COMPLAINTS/PURPOSE OF CONSULTATION:  High-grade follicular lymphoma, for further management  HISTORY OF PRESENTING ILLNESS:  [name] 67 y.o. female is here because of progression of high-grade follicular non-Hodgkin lymphoma She transferred her care here because she was told she needed chemotherapy as she needed to follow with an oncologist consistently. Her energy level is fair. She noted poor wound healing especially if she has any skin scabs. She denies any recent weight loss. She has regular daily sweats but they were non-drenching in nature. She denies any recent loss of appetite. The sites of previous biopsy area has not changed. Denies any new lymphadenopathy recently  Oncology History     Lymphoma, low grade   05/26/2011 Imaging CT scan of the neck show numerous lymphadenopathy especially in the parotid region. Needle biopsy was inconclusive.     08/02/2011 Imaging CT scan of the chest abdomen and pelvis showed numerous lymphadenopathy above and below the diaphragm.    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 Grade (if applicable): Favor high grade, Ki-67 ranges from 10-50%. Immunohistochemical stains: CD20, CD79a, CD10, BCL-2, BCL-6, CD21, CD3, CD43.   05/09/2013 Procedure BM biopsy is highly suspicious of involvement. Patient had persistent leukopenia   05/09/2013 Imaging PET/CT showed significant involvement of LN everywhere   No matching staging information was found for the patient.      Lymphoma, low grade   05/26/2011 Imaging CT scan of the neck show numerous lymphadenopathy  especially in the parotid region. Needle biopsy was inconclusive.     08/02/2011 Imaging CT scan of the chest abdomen and pelvis showed numerous lymphadenopathy above and below the diaphragm.    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 Grade (if applicable): Favor high grade, Ki-67 ranges from 10-50%. Immunohistochemical stains: CD20, CD79a, CD10, BCL-2, BCL-6, CD21, CD3, CD43.   05/09/2013 Procedure BM biopsy is highly suspicious of involvement. Patient had persistent leukopenia   05/09/2013 Imaging PET/CT showed significant involvement of LN everywhere  \  MEDICAL HISTORY:  Past Medical History  Diagnosis Date  . CAD (coronary artery disease)   . GERD (gastroesophageal reflux disease)   . HTN (hypertension)   . Hyperlipidemia   . Enlargement of lymph nodes 07/26/2011  . 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 Lymphoma   . Tick bite of knee   . Thyroid disease   . Follicular lymphoma 06/27/2013    SURGICAL HISTORY: Past Surgical History  Procedure Laterality Date  . Mr breast bilateral  cyst removal  . Abdominal hysterectomy    . Lymph node dissection  x 3 different times    behind left ear 2012/ left clavical area  . Tubal ligation    . Knee arthroscopy w/ debridement  05/2011    right  . Bone marrow biopsy  05/09/2013  . Bone marrow aspiration  05/09/2013    SOCIAL HISTORY: History   Social History  .  Marital Status: Married    Spouse Name: N/A    Number of Children: N/A  . Years of Education: N/A   Occupational History  . [employer]    Social History Main Topics  . Smoking status: Never Smoker   . Smokeless tobacco: Never Used  . Alcohol Use: No  . Drug Use: No  .  Sexual Activity: No   Other Topics Concern  . Not on file   Social History Narrative  . No narrative on file    FAMILY HISTORY: Family History  Problem Relation Age of Onset  . Cancer    . Stroke    . Heart disease    . Coronary artery disease    . Cancer Mother     breast  . Stroke Mother   . Cancer Father     pancreatic cancer  . Cancer Brother     kidney cancer/brain tumor    ALLERGIES:  is allergic to furosemide and latex.  MEDICATIONS:  Current Outpatient Prescriptions  Medication Sig Dispense Refill  . ALPRAZolam (XANAX) 0.25 MG tablet Take 0.25 mg by mouth at bedtime as needed for sleep.       Marland Kitchen aspirin 81 MG tablet Take 81 mg by mouth at bedtime.       . Calcium-Magnesium-Zinc (CAL-MAG-ZINC PO) Take 1 tablet by mouth 2 (two) times daily.        . carboxymethylcellulose (REFRESH PLUS) 0.5 % SOLN Place 1 drop into both eyes daily as needed (DRY EYES).      . Chlorphen-PE-Acetaminophen 4-10-325 MG TABS Take 1 tablet by mouth daily as needed. Taking for sinus      . Cholecalciferol (VITAMIN D) 2000 UNITS tablet Take 2,000 Units by mouth daily.      . clobetasol cream (TEMOVATE) 0.05 % Apply 1 application topically 2 (two) times daily. Apply to affected area      . ibuprofen (ADVIL,MOTRIN) 800 MG tablet Take 800 mg by mouth every 6 (six) hours as needed. For pain      . lansoprazole (PREVACID) 30 MG capsule Take 30 mg by mouth daily.       Marland Kitchen loratadine (CLARITIN) 10 MG tablet Take 10 mg by mouth daily.       Marland Kitchen losartan-hydrochlorothiazide (HYZAAR) 100-12.5 MG per tablet Take 1 tablet by mouth every evening.       . Omega-3 Fatty Acids (FISH OIL) 1200 MG CAPS Take 1 capsule by mouth 2 (two) times daily.        . potassium chloride SA (K-DUR,KLOR-CON) 20 MEQ tablet Take 20 mEq by mouth daily.       . Probiotic Product (PROBIOTIC DAILY PO) Take 1 capsule by mouth daily.      Marland Kitchen pyridOXINE (VITAMIN B-6) 100 MG tablet Take 100 mg by mouth daily.       . Red Yeast Rice 600 MG  CAPS Take 1 capsule by mouth every evening.       . vitamin C (ASCORBIC ACID) 500 MG tablet Take 500 mg by mouth daily.      Marland Kitchen allopurinol (ZYLOPRIM) 300 MG tablet Take 1 tablet (300 mg total) by mouth daily.  30 tablet  0   No current facility-administered medications for this visit.    REVIEW OF SYSTEMS:   Constitutional: Denies fevers, chills or abnormal night sweats Eyes: Denies blurriness of vision, double vision or watery eyes Ears, nose, mouth, throat, and face: Denies mucositis or sore throat Respiratory: Denies cough, dyspnea or wheezes Cardiovascular:  Denies palpitation, chest discomfort or lower extremity swelling Gastrointestinal:  Denies nausea, heartburn or change in bowel habits Skin: Denies abnormal skin rashes Neurological:Denies numbness, tingling or new weaknesses Behavioral/Psych: Mood is stable, no new changes  All other systems were reviewed with the patient and are negative.  PHYSICAL EXAMINATION: ECOG PERFORMANCE STATUS: 0 - Asymptomatic  Filed Vitals:   06/27/13 1006  BP: 149/88  Pulse: 67  Temp: 97.3 F (36.3 C)  Resp: 18   Filed Weights   06/27/13 1006  Weight: 183 lb 14.4 oz (83.416 kg)    GENERAL:alert, no distress and comfortable SKIN: skin color, texture, turgor are normal, no rashes or significant lesions EYES: normal, conjunctiva are pink and non-injected, sclera clear OROPHARYNX:no exudate, no erythema and lips, buccal mucosa, and tongue normal  NECK: supple, thyroid normal size, non-tender, without nodularity LYMPH:  She has palpable lymphadenopathy in the head and neck region and axillary region but not in the inguinal area LUNGS: clear to auscultation and percussion with normal breathing effort HEART: regular rate & rhythm and no murmurs and no lower extremity edema ABDOMEN:abdomen soft, non-tender and normal bowel sounds no palpable splenomegaly Musculoskeletal:no cyanosis of digits and no clubbing  PSYCH: alert & oriented x 3 with  fluent speech NEURO: no focal motor/sensory deficits  LABORATORY DATA:  I have reviewed the data as listed Recent Results (from the past 2160 hour(s))  CBC WITH DIFFERENTIAL     Status: Abnormal   Collection Time    04/08/13  4:20 PM      Result Value Range   WBC 3.9 (*) 4.0 - 10.5 K/uL   RBC 4.68  3.87 - 5.11 MIL/uL   Hemoglobin 13.4  12.0 - 15.0 g/dL   HCT 40.9  81.1 - 91.4 %   MCV 84.0  78.0 - 100.0 fL   MCH 28.6  26.0 - 34.0 pg   MCHC 34.1  30.0 - 36.0 g/dL   RDW 78.2  95.6 - 21.3 %   Platelets 202  150 - 400 K/uL   Neutrophils Relative % 64  43 - 77 %   Neutro Abs 2.5  1.7 - 7.7 K/uL   Lymphocytes Relative 23  12 - 46 %   Lymphs Abs 0.9  0.7 - 4.0 K/uL   Monocytes Relative 8  3 - 12 %   Monocytes Absolute 0.3  0.1 - 1.0 K/uL   Eosinophils Relative 4  0 - 5 %   Eosinophils Absolute 0.1  0.0 - 0.7 K/uL   Basophils Relative 1  0 - 1 %   Basophils Absolute 0.1  0.0 - 0.1 K/uL  COMPREHENSIVE METABOLIC PANEL     Status: Abnormal   Collection Time    04/08/13  4:20 PM      Result Value Range   Sodium 139  135 - 145 mEq/L   Potassium 3.8  3.5 - 5.1 mEq/L   Chloride 101  96 - 112 mEq/L   CO2 31  19 - 32 mEq/L   Glucose, Bld 92  70 - 99 mg/dL   BUN 21  6 - 23 mg/dL   Creatinine, Ser 0.86  0.50 - 1.10 mg/dL   Calcium 9.9  8.4 - 57.8 mg/dL   Total Protein 7.6  6.0 - 8.3 g/dL   Albumin 4.5  3.5 - 5.2 g/dL   AST 24  0 - 37 U/L   ALT 16  0 - 35 U/L   Alkaline Phosphatase 79  39 - 117 U/L  Total Bilirubin 0.4  0.3 - 1.2 mg/dL   GFR calc non Af Amer 60 (*) >90 mL/min   GFR calc Af Amer 69 (*) >90 mL/min   Comment:            The eGFR has been calculated     using the CKD EPI equation.     This calculation has not been     validated in all clinical     situations.     eGFR's persistently     <90 mL/min signify     possible Chronic Kidney Disease.  BETA 2 MICROGLOBULINE, SERUM     Status: None   Collection Time    04/08/13  4:20 PM      Result Value Range   Beta-2  Microglobulin 1.61  1.01 - 1.73 mg/L  HEPATITIS B E ANTIBODY     Status: None   Collection Time    04/08/13  4:20 PM      Result Value Range   Hep B E Ab Negative  Negative  HEPATITIS B E ANTIGEN     Status: None   Collection Time    04/08/13  4:20 PM      Result Value Range   Hep B E Ag Negative  Negative  LACTATE DEHYDROGENASE     Status: None   Collection Time    04/08/13  4:20 PM      Result Value Range   LDH 206  94 - 250 U/L  GLUCOSE, CAPILLARY     Status: None   Collection Time    04/15/13  7:36 AM      Result Value Range   Glucose-Capillary 99  70 - 99 mg/dL  APTT     Status: None   Collection Time    05/09/13  7:20 AM      Result Value Range   aPTT 36  24 - 37 seconds  CBC     Status: Abnormal   Collection Time    05/09/13  7:20 AM      Result Value Range   WBC 3.0 (*) 4.0 - 10.5 K/uL   RBC 4.85  3.87 - 5.11 MIL/uL   Hemoglobin 13.7  12.0 - 15.0 g/dL   HCT 16.1  09.6 - 04.5 %   MCV 82.3  78.0 - 100.0 fL   MCH 28.2  26.0 - 34.0 pg   MCHC 34.3  30.0 - 36.0 g/dL   RDW 40.9  81.1 - 91.4 %   Platelets 195  150 - 400 K/uL  PROTIME-INR     Status: None   Collection Time    05/09/13  7:20 AM      Result Value Range   Prothrombin Time 12.2  11.6 - 15.2 seconds   INR 0.92  0.00 - 1.49  BONE MARROW EXAM     Status: None   Collection Time    05/09/13  9:20 AM      Result Value Range   Bone Marrow Exam SEE PATHOLOGY REPORT [id number]    CHROMOSOME ANALYSIS, BONE MARROW     Status: None   Collection Time    05/09/13  9:20 AM      Result Value Range   Chromosome-Routine SEE SEPARATE REPORT     Comment: Performed at [facility]    RADIOGRAPHIC STUDIES: I have personally reviewed the radiological images as listed and agreed with the findings in the report. All her recent PET/CT scan were reviewed ASSESSMENT:  #  1 high-grade follicular lymphoma, stage IV a I reviewed the patient's guidelines in treatment for high-grade follicular lymphoma. Due to her  progression of disease, and the high-grade nature of her disease, I recommend for her to start systemic chemotherapy. He 2 large disease burden, him to start her on allopurinol today to prevent tumor lysis in the future. I discussed with the patient CALGB 16109 study which is available and I think the patient would be an excellent candidate for this clinical trial. The patient is interested. I got my Designer, industrial/product involved today. I will go ahead and arrange for her to have a Infuse-a-Port placed and chemotherapy class education. I plan to bring her back in the near future to discuss about risk, benefits, side effects of treatment and consent. My study coordinator will help me arrange appropriate tests as needed. I will review her case in the hematology conference next week. PLAN:  Orders Placed This Encounter  Procedures  . IR Fluoro Guide CV Line Right    Indicate type of CVC ordering    Standing Status: Future     Number of Occurrences:      Standing Expiration Date: 08/27/2014    Order Specific Question:  Reason for exam:    Answer:  infuasport placement before chemo    Order Specific Question:  Preferred Imaging Location?    Answer:  [facility]    All questions were answered. The patient knows to call the clinic with any problems, questions or concerns. I spent 40 minutes counseling the patient face to face. The total time spent in the appointment was 60 minutes and more than 50% was on counseling.     Charnee Turnipseed, MD 06/27/2013 10:58 AM

## 2013-06-27 NOTE — Telephone Encounter (Signed)
gv and printed appt sched and avs for pt for OCT adn NOV.. °

## 2013-06-27 NOTE — Progress Notes (Signed)
Checked in new patient with no financial issues. I gave her appt card and [place].

## 2013-06-30 ENCOUNTER — Encounter: Payer: Self-pay | Admitting: *Deleted

## 2013-06-30 ENCOUNTER — Encounter (HOSPITAL_COMMUNITY): Payer: Self-pay | Admitting: Pharmacy Technician

## 2013-06-30 ENCOUNTER — Other Ambulatory Visit: Payer: Self-pay | Admitting: Hematology and Oncology

## 2013-06-30 ENCOUNTER — Other Ambulatory Visit: Payer: Self-pay | Admitting: *Deleted

## 2013-06-30 DIAGNOSIS — C8290 Follicular lymphoma, unspecified, unspecified site: Secondary | ICD-10-CM

## 2013-06-30 NOTE — Addendum Note (Signed)
Addended byBertis Ruddy, Eleonora Peeler on: 06/30/2013 01:46 PM   Modules accepted: Orders

## 2013-07-01 ENCOUNTER — Other Ambulatory Visit: Payer: Self-pay | Admitting: *Deleted

## 2013-07-01 ENCOUNTER — Other Ambulatory Visit: Payer: Self-pay | Admitting: Hematology and Oncology

## 2013-07-01 ENCOUNTER — Other Ambulatory Visit (HOSPITAL_COMMUNITY): Payer: Medicare Other

## 2013-07-01 DIAGNOSIS — C8290 Follicular lymphoma, unspecified, unspecified site: Secondary | ICD-10-CM

## 2013-07-02 ENCOUNTER — Encounter: Payer: Self-pay | Admitting: Hematology and Oncology

## 2013-07-02 ENCOUNTER — Other Ambulatory Visit: Payer: Self-pay | Admitting: *Deleted

## 2013-07-02 ENCOUNTER — Other Ambulatory Visit: Payer: Medicare Other

## 2013-07-02 ENCOUNTER — Inpatient Hospital Stay (HOSPITAL_COMMUNITY): Admission: RE | Admit: 2013-07-02 | Payer: Medicare Other | Source: Ambulatory Visit

## 2013-07-02 ENCOUNTER — Telehealth: Payer: Self-pay | Admitting: Hematology and Oncology

## 2013-07-02 NOTE — Telephone Encounter (Signed)
Returned pt's call re confirming 10/23 appts. Confirmed 10/23 appts and also appts for 10/29 11/4 and 11/5 added today. 10/29 f/u added per 10/21 pof. appt - appt slot for 10/28 was taken and pet scan also 10/28 so f/u was scheduled for the next day - desk nurse aware. Also per 10/21 pof Rchop 1st wk in November. Rchop added for 11/5 and 11/5 f/u moved to 11/4 due to no available for fu/tx same day. Copy of echo order to [facility] for preauth. Per 10/21 pof cx all ct scan appts and keep pet. Pet already on schedule and no ct scan appts are on schedule.

## 2013-07-03 ENCOUNTER — Other Ambulatory Visit (HOSPITAL_BASED_OUTPATIENT_CLINIC_OR_DEPARTMENT_OTHER): Payer: Medicare Other | Admitting: Lab

## 2013-07-03 ENCOUNTER — Encounter (HOSPITAL_COMMUNITY): Payer: Self-pay | Admitting: Pharmacy Technician

## 2013-07-03 ENCOUNTER — Other Ambulatory Visit: Payer: Medicare Other

## 2013-07-03 DIAGNOSIS — C8299 Follicular lymphoma, unspecified, extranodal and solid organ sites: Secondary | ICD-10-CM | POA: Diagnosis not present

## 2013-07-03 DIAGNOSIS — C8590 Non-Hodgkin lymphoma, unspecified, unspecified site: Secondary | ICD-10-CM

## 2013-07-03 DIAGNOSIS — C8290 Follicular lymphoma, unspecified, unspecified site: Secondary | ICD-10-CM

## 2013-07-03 DIAGNOSIS — Z23 Encounter for immunization: Secondary | ICD-10-CM | POA: Diagnosis not present

## 2013-07-03 LAB — CBC WITH DIFFERENTIAL/PLATELET
BASO%: 2 % (ref 0.0–2.0)
HCT: 41.2 % (ref 34.8–46.6)
LYMPH%: 32 % (ref 14.0–49.7)
MCH: 28.3 pg (ref 25.1–34.0)
MCHC: 34.3 g/dL (ref 31.5–36.0)
MCV: 82.4 fL (ref 79.5–101.0)
MONO#: 0.2 10*3/uL (ref 0.1–0.9)
MONO%: 6.9 % (ref 0.0–14.0)
NEUT#: 1.6 10*3/uL (ref 1.5–6.5)
NEUT%: 55 % (ref 38.4–76.8)
Platelets: 203 10*3/uL (ref 145–400)
RDW: 14.4 % (ref 11.2–14.5)
WBC: 2.9 10*3/uL — ABNORMAL LOW (ref 3.9–10.3)

## 2013-07-03 LAB — COMPREHENSIVE METABOLIC PANEL (CC13)
ALT: 18 U/L (ref 0–55)
Anion Gap: 11 mEq/L (ref 3–11)
CO2: 26 mEq/L (ref 22–29)
Calcium: 10 mg/dL (ref 8.4–10.4)
Creatinine: 1.1 mg/dL (ref 0.6–1.1)
Sodium: 140 mEq/L (ref 136–145)
Total Bilirubin: 0.94 mg/dL (ref 0.20–1.20)

## 2013-07-03 LAB — LACTATE DEHYDROGENASE (CC13): LDH: 213 U/L (ref 125–245)

## 2013-07-04 ENCOUNTER — Other Ambulatory Visit: Payer: Self-pay | Admitting: Radiology

## 2013-07-04 ENCOUNTER — Ambulatory Visit (HOSPITAL_COMMUNITY): Payer: Medicare Other

## 2013-07-04 ENCOUNTER — Telehealth: Payer: Self-pay | Admitting: Hematology and Oncology

## 2013-07-04 LAB — HEPATITIS B SURFACE ANTIGEN: Hepatitis B Surface Ag: NEGATIVE

## 2013-07-04 LAB — BETA 2 MICROGLOBULIN, SERUM: Beta-2 Microglobulin: 2.05 mg/L — ABNORMAL HIGH (ref 1.01–1.73)

## 2013-07-04 NOTE — Telephone Encounter (Signed)
S/w pt re echo appt to be done same day as pet 11/29 @ 12pm. Pt aware she is to arrive @ EL 11:45am to check in for echo and then proceed to rad for pet @ 1pm.

## 2013-07-08 ENCOUNTER — Encounter (HOSPITAL_COMMUNITY): Payer: Self-pay

## 2013-07-08 ENCOUNTER — Other Ambulatory Visit: Payer: Self-pay | Admitting: Hematology and Oncology

## 2013-07-08 ENCOUNTER — Ambulatory Visit (HOSPITAL_COMMUNITY)
Admission: RE | Admit: 2013-07-08 | Discharge: 2013-07-08 | Disposition: A | Discharge status: Home / Self Care | Payer: Medicare Other | Source: Ambulatory Visit | Attending: Hematology and Oncology | Admitting: Hematology and Oncology

## 2013-07-08 DIAGNOSIS — C8290 Follicular lymphoma, unspecified, unspecified site: Secondary | ICD-10-CM

## 2013-07-08 DIAGNOSIS — E785 Hyperlipidemia, unspecified: Secondary | ICD-10-CM | POA: Diagnosis not present

## 2013-07-08 DIAGNOSIS — I10 Essential (primary) hypertension: Secondary | ICD-10-CM | POA: Diagnosis not present

## 2013-07-08 DIAGNOSIS — K219 Gastro-esophageal reflux disease without esophagitis: Secondary | ICD-10-CM | POA: Insufficient documentation

## 2013-07-08 DIAGNOSIS — C8299 Follicular lymphoma, unspecified, extranodal and solid organ sites: Secondary | ICD-10-CM | POA: Diagnosis not present

## 2013-07-08 DIAGNOSIS — C8589 Other specified types of non-Hodgkin lymphoma, extranodal and solid organ sites: Secondary | ICD-10-CM | POA: Diagnosis not present

## 2013-07-08 DIAGNOSIS — I2510 Atherosclerotic heart disease of native coronary artery without angina pectoris: Secondary | ICD-10-CM | POA: Diagnosis not present

## 2013-07-08 LAB — CBC
Hemoglobin: 13.3 g/dL (ref 12.0–15.0)
MCH: 27.9 pg (ref 26.0–34.0)
MCV: 80.9 fL (ref 78.0–100.0)
RBC: 4.77 MIL/uL (ref 3.87–5.11)
WBC: 3.1 10*3/uL — ABNORMAL LOW (ref 4.0–10.5)

## 2013-07-08 LAB — PROTIME-INR: INR: 0.92 (ref 0.00–1.49)

## 2013-07-08 MED ORDER — SODIUM CHLORIDE 0.9 % IV SOLN
INTRAVENOUS | Status: DC
  Administered 2013-07-08: 20 mL/h via INTRAVENOUS

## 2013-07-08 MED ORDER — FENTANYL CITRATE 0.05 MG/ML IJ SOLN
INTRAMUSCULAR | Status: AC | PRN
  Administered 2013-07-08: 50 ug via INTRAVENOUS
  Administered 2013-07-08: 25 ug via INTRAVENOUS
  Administered 2013-07-08: 50 ug via INTRAVENOUS

## 2013-07-08 MED ORDER — MIDAZOLAM HCL 2 MG/2ML IJ SOLN
INTRAMUSCULAR | Status: AC
  Filled 2013-07-08: qty 6

## 2013-07-08 MED ORDER — FENTANYL CITRATE 0.05 MG/ML IJ SOLN
INTRAMUSCULAR | Status: AC
  Filled 2013-07-08: qty 6

## 2013-07-08 MED ORDER — CEFAZOLIN SODIUM-DEXTROSE 2-3 GM-% IV SOLR
2.0000 g | INTRAVENOUS | Status: AC
  Administered 2013-07-08: 2 g via INTRAVENOUS
  Filled 2013-07-08: qty 50

## 2013-07-08 MED ORDER — LIDOCAINE HCL 1 % IJ SOLN
INTRAMUSCULAR | Status: AC
  Filled 2013-07-08: qty 20

## 2013-07-08 MED ORDER — HEPARIN SOD (PORK) LOCK FLUSH 100 UNIT/ML IV SOLN
INTRAVENOUS | Status: AC | PRN
  Administered 2013-07-08: 500 [IU]

## 2013-07-08 MED ORDER — MIDAZOLAM HCL 2 MG/2ML IJ SOLN
INTRAMUSCULAR | Status: AC | PRN
  Administered 2013-07-08: 0.5 mg via INTRAVENOUS
  Administered 2013-07-08 (×2): 1 mg via INTRAVENOUS

## 2013-07-08 NOTE — H&P (Signed)
[name] is an 67 y.o. female.   Chief Complaint: "I'm getting a port a cath put in" HPI: Patient with history of high grade follicular NHL presents today for port a cath placement for chemotherapy.  Past Medical History  Diagnosis Date  . CAD (coronary artery disease)   . GERD (gastroesophageal reflux disease)   . HTN (hypertension)   . Hyperlipidemia   . Enlargement of lymph nodes 07/26/2011  . 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 Lymphoma   . Tick bite of knee   . Thyroid disease   . Follicular lymphoma 06/27/2013  . 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  B-cell, favor follicular NHL.  CD79a, CD20 and CD10 positive.  Cyclin D1 negative.     Past Surgical History  Procedure Laterality Date  . Mr breast bilateral  cyst removal  . Abdominal hysterectomy    . Lymph node dissection  x 3 different times    behind left ear 2012/ left clavical area  . Tubal ligation    . Knee arthroscopy w/ debridement  05/2011    right  . Bone marrow biopsy  05/09/2013  . Bone marrow aspiration  05/09/2013    Family History  Problem Relation Age of Onset  . Cancer    . Stroke    . Heart disease    . Coronary artery disease    . Cancer Mother     breast  . Stroke Mother   . Cancer Father     pancreatic cancer  . Cancer Brother     kidney cancer/brain tumor   Social History:  reports that she has never smoked. She has never used smokeless tobacco. She reports that she does not drink alcohol or use illicit drugs.  Allergies:  Allergies  Allergen Reactions  . Furosemide Other (See Comments)    Makes her feel very weak  . Latex Swelling and Rash    Current outpatient prescriptions:allopurinol (ZYLOPRIM) 300 MG tablet, Take 1 tablet (300 mg total) by mouth daily., Disp: 30 tablet, Rfl: 0;  aspirin 81 MG tablet, Take 81 mg by mouth at bedtime. , Disp: , Rfl:  ;  Calcium-Magnesium-Zinc (CAL-MAG-ZINC PO), Take 1 tablet by mouth daily. , Disp: , Rfl: ;  carboxymethylcellulose (REFRESH PLUS) 0.5 % SOLN, Place 1 drop into both eyes daily as needed (DRY EYES)., Disp: , Rfl:  Chlorphen-PE-Acetaminophen 4-10-325 MG TABS, Take 1 tablet by mouth daily as needed. Taking for sinus, Disp: , Rfl: ;  Cholecalciferol (VITAMIN D) 2000 UNITS tablet, Take 2,000 Units by mouth daily., Disp: , Rfl: ;  clobetasol cream (TEMOVATE) 0.05 %, Apply 1 application topically daily. Apply to affected area, Disp: , Rfl: ;  ibuprofen (ADVIL,MOTRIN) 800 MG tablet, Take 800 mg by mouth every 6 (six) hours as needed. For pain, Disp: , Rfl:  lansoprazole (PREVACID) 30 MG capsule, Take 30 mg by mouth daily. , Disp: , Rfl: ;  loratadine (CLARITIN) 10 MG tablet, Take 10 mg by mouth daily. , Disp: , Rfl: ;  losartan-hydrochlorothiazide (HYZAAR) 100-12.5 MG per tablet, Take 1 tablet by mouth every evening. , Disp: , Rfl: ;  Omega-3 Fatty Acids (FISH OIL) 1200 MG CAPS, Take 2 capsules by mouth daily. , Disp: , Rfl:  potassium chloride SA (K-DUR,KLOR-CON) 20 MEQ tablet, Take 20 mEq by mouth daily. , Disp: , Rfl: ;  Probiotic Product (  PROBIOTIC DAILY PO), Take 1 capsule by mouth daily., Disp: , Rfl: ;  pyridOXINE (VITAMIN B-6) 100 MG tablet, Take 100 mg by mouth daily. , Disp: , Rfl: ;  Red Yeast Rice 600 MG CAPS, Take 1 capsule by mouth every evening. , Disp: , Rfl: ;  vitamin C (ASCORBIC ACID) 500 MG tablet, Take 500 mg by mouth daily., Disp: , Rfl:  ALPRAZolam (XANAX) 0.25 MG tablet, Take 0.25 mg by mouth at bedtime as needed for sleep. , Disp: , Rfl:  Current facility-administered medications:0.9 %  sodium chloride infusion, , Intravenous, Continuous, D Jeananne Rama, PA-C, Last Rate: 20 mL/hr at 07/08/13 1205, 20 mL/hr at 07/08/13 1205;  ceFAZolin (ANCEF) IVPB 2 g/50 mL premix, 2 g, Intravenous, On Call, D Jeananne Rama, PA-C   Results for orders placed during the hospital encounter of 07/08/13 (from the  past 48 hour(s))  APTT     Status: None   Collection Time    07/08/13 11:45 AM      Result Value Range   aPTT 34  24 - 37 seconds  CBC     Status: Abnormal   Collection Time    07/08/13 11:45 AM      Result Value Range   WBC 3.1 (*) 4.0 - 10.5 K/uL   RBC 4.77  3.87 - 5.11 MIL/uL   Hemoglobin 13.3  12.0 - 15.0 g/dL   HCT 91.4  78.2 - 95.6 %   MCV 80.9  78.0 - 100.0 fL   MCH 27.9  26.0 - 34.0 pg   MCHC 34.5  30.0 - 36.0 g/dL   RDW 21.3  08.6 - 57.8 %   Platelets 182  150 - 400 K/uL  PROTIME-INR     Status: None   Collection Time    07/08/13 11:45 AM      Result Value Range   Prothrombin Time 12.2  11.6 - 15.2 seconds   INR 0.92  0.00 - 1.49   No results found.  Review of Systems  Constitutional: Negative for fever and chills.  Respiratory: Negative for cough and shortness of breath.   Cardiovascular: Negative for chest pain.  Gastrointestinal: Negative for nausea, vomiting and abdominal pain.  Musculoskeletal:       Occ back pain  Neurological: Negative for headaches.  Endo/Heme/Allergies: Does not bruise/bleed easily.    Blood pressure 131/74, pulse 69, temperature 98.2 F (36.8 C), temperature source Oral, resp. rate 16, SpO2 98.00%. Physical Exam  Constitutional: She is oriented to person, place, and time. She appears well-developed and well-nourished.  Cardiovascular: Normal rate and regular rhythm.   Respiratory: Effort normal and breath sounds normal.  GI: Soft. Bowel sounds are normal.  obese  Musculoskeletal: Normal range of motion. She exhibits no edema.  Neurological: She is alert and oriented to person, place, and time.     Assessment/Plan: Patient with history of high grade follicular NHL. Plan is for port a cath placement today for chemotherapy. Details/risks of procedure d/w pt/husband with their understanding and consent.   Brayton Baumgartner,D KEVIN 07/08/2013, 12:40 PM

## 2013-07-08 NOTE — Procedures (Signed)
RIJV PAC tip SVC RA No comp 

## 2013-07-09 ENCOUNTER — Encounter (HOSPITAL_COMMUNITY)
Admission: RE | Admit: 2013-07-09 | Discharge: 2013-07-09 | Disposition: A | Discharge status: Home / Self Care | Payer: Medicare Other | Source: Ambulatory Visit | Attending: Hematology and Oncology | Admitting: Hematology and Oncology

## 2013-07-09 ENCOUNTER — Other Ambulatory Visit (HOSPITAL_COMMUNITY): Payer: Medicare Other

## 2013-07-09 ENCOUNTER — Ambulatory Visit (HOSPITAL_COMMUNITY)
Admission: RE | Admit: 2013-07-09 | Discharge: 2013-07-09 | Disposition: A | Discharge status: Home / Self Care | Payer: Medicare Other | Source: Ambulatory Visit | Attending: Hematology and Oncology | Admitting: Hematology and Oncology

## 2013-07-09 ENCOUNTER — Encounter (HOSPITAL_COMMUNITY): Payer: Self-pay

## 2013-07-09 DIAGNOSIS — R599 Enlarged lymph nodes, unspecified: Secondary | ICD-10-CM | POA: Diagnosis not present

## 2013-07-09 DIAGNOSIS — C8589 Other specified types of non-Hodgkin lymphoma, extranodal and solid organ sites: Secondary | ICD-10-CM | POA: Diagnosis not present

## 2013-07-09 DIAGNOSIS — R229 Localized swelling, mass and lump, unspecified: Secondary | ICD-10-CM | POA: Insufficient documentation

## 2013-07-09 DIAGNOSIS — C8299 Follicular lymphoma, unspecified, extranodal and solid organ sites: Secondary | ICD-10-CM | POA: Insufficient documentation

## 2013-07-09 DIAGNOSIS — I10 Essential (primary) hypertension: Secondary | ICD-10-CM | POA: Diagnosis not present

## 2013-07-09 DIAGNOSIS — I519 Heart disease, unspecified: Secondary | ICD-10-CM

## 2013-07-09 DIAGNOSIS — C8290 Follicular lymphoma, unspecified, unspecified site: Secondary | ICD-10-CM

## 2013-07-09 MED ORDER — FLUDEOXYGLUCOSE F - 18 (FDG) INJECTION
18.7000 | Freq: Once | INTRAVENOUS | Status: AC | PRN
  Administered 2013-07-09: 18.7 via INTRAVENOUS

## 2013-07-09 NOTE — Progress Notes (Signed)
  Echocardiogram 2D Echocardiogram limited has been performed.  Cathie Beams 07/09/2013, 12:44 PM

## 2013-07-10 ENCOUNTER — Ambulatory Visit (HOSPITAL_BASED_OUTPATIENT_CLINIC_OR_DEPARTMENT_OTHER): Payer: Medicare Other | Admitting: Hematology and Oncology

## 2013-07-10 VITALS — BP 129/81 | HR 76 | Temp 97.1°F | Resp 18 | Ht 62.0 in | Wt 186.4 lb

## 2013-07-10 DIAGNOSIS — C8299 Follicular lymphoma, unspecified, extranodal and solid organ sites: Secondary | ICD-10-CM

## 2013-07-10 DIAGNOSIS — C8239 Follicular lymphoma grade IIIa, extranodal and solid organ sites: Secondary | ICD-10-CM

## 2013-07-10 MED ORDER — ONDANSETRON HCL 8 MG PO TABS: 8.0000 mg | ORAL_TABLET | Freq: Three times a day (TID) | ORAL | Status: DC | PRN

## 2013-07-10 MED ORDER — PREDNISONE 50 MG PO TABS: 50.0000 mg/m2 | ORAL_TABLET | Freq: Every day | ORAL | Status: DC

## 2013-07-10 MED ORDER — LIDOCAINE-PRILOCAINE 2.5-2.5 % EX CREA: TOPICAL_CREAM | CUTANEOUS | Status: DC | PRN

## 2013-07-10 MED ORDER — PROCHLORPERAZINE MALEATE 10 MG PO TABS: 10.0000 mg | ORAL_TABLET | Freq: Four times a day (QID) | ORAL | Status: DC | PRN

## 2013-07-10 NOTE — Progress Notes (Signed)
[facility] OFFICE PROGRESS NOTE  Patient Care Team: Mirna Mires, MD as PCP - General (Family Medicine)  DIAGNOSIS: High-grade follicular lymphoma, stage IV a  SUMMARY OF ONCOLOGIC HISTORY: Oncology History     Lymphoma, low grade   05/26/2011 Imaging CT scan of the neck show numerous lymphadenopathy especially in the parotid region. Needle biopsy was inconclusive.     08/02/2011 Imaging CT scan of the chest abdomen and pelvis showed numerous lymphadenopathy above and below the diaphragm.    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 Grade (if applicable): Favor high grade, Ki-67 ranges from 10-50%. Immunohistochemical stains: CD20, CD79a, CD10, BCL-2, BCL-6, CD21, CD3, CD43.   05/09/2013 Procedure BM biopsy is highly suspicious of involvement. Patient had persistent leukopenia   05/09/2013 Imaging PET/CT showed significant involvement of LN everywhere         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 Needle biopsy was inconclusive.     08/02/2011 Imaging CT scan of the chest abdomen and pelvis showed numerous lymphadenopathy above and below the diaphragm.    08/16/2011 Imaging MRI of the abdomen revealed enlarging left kidney lesion worrisome for malignancy   09/20/2011 Initial Diagnosis Lymphoma, low grade from kidney biopsy, follicular   03/28/2013 Procedure Histologic type: Non-Hodgkin's lymphoma, follicular center cell type. Grade (if applicable): Favor high grade, Ki-67 ranges from 10-50%. Immunohistochemical stains: CD20, CD79a, CD10, BCL-2, BCL-6, CD21, CD3, CD43.   05/09/2013  Procedure BM biopsy is highly suspicious of involvement. Patient had persistent leukopenia   05/09/2013 Imaging PET/CT showed significant involvement of LN everywhere    Follicular lymphoma (Resolved)    INTERVAL HISTORY: [name] 67 y.o. female returns for further followup. She is doing well. Denies any new lymphadenopathy. She had placement of Infuse-a-Port recently and denies any pain  I have reviewed the past medical history, past surgical history, social history and family history with the patient and they are unchanged from previous note.  ALLERGIES:  is allergic to furosemide and latex.  MEDICATIONS:  Current Outpatient Prescriptions  Medication Sig Dispense Refill  . allopurinol (ZYLOPRIM) 300 MG tablet Take 1 tablet (300 mg total) by mouth daily.  30 tablet  0  . ALPRAZolam (XANAX) 0.25 MG tablet Take 0.25 mg by mouth at bedtime as needed for sleep.       Marland Kitchen aspirin 81 MG tablet Take 81 mg by mouth at bedtime.       . Calcium-Magnesium-Zinc (CAL-MAG-ZINC PO) Take 1 tablet by mouth daily.       . carboxymethylcellulose (REFRESH PLUS) 0.5 % SOLN Place 1 drop into both eyes daily as needed (DRY EYES).      . Chlorphen-PE-Acetaminophen 4-10-325 MG TABS Take 1 tablet by mouth daily as needed. Taking for sinus      . Cholecalciferol (VITAMIN D) 2000 UNITS tablet Take 2,000 Units by mouth daily.      . clobetasol cream (TEMOVATE) 0.05 % Apply 1 application topically daily. Apply to affected area      . lansoprazole (PREVACID) 30 MG capsule Take 30 mg by mouth daily.       Marland Kitchen loratadine (CLARITIN) 10 MG tablet Take  10 mg by mouth daily.       Marland Kitchen losartan-hydrochlorothiazide (HYZAAR) 100-12.5 MG per tablet Take 1 tablet by mouth every evening.       . Omega-3 Fatty Acids (FISH OIL) 1200 MG CAPS Take 2 capsules by mouth daily.       . potassium chloride SA (K-DUR,KLOR-CON) 20 MEQ tablet Take 20 mEq by mouth daily.       . Probiotic Product (PROBIOTIC DAILY PO) Take 1 capsule by mouth  daily.      Marland Kitchen pyridOXINE (VITAMIN B-6) 100 MG tablet Take 100 mg by mouth daily.       . Red Yeast Rice 600 MG CAPS Take 1 capsule by mouth every evening.       . vitamin C (ASCORBIC ACID) 500 MG tablet Take 500 mg by mouth daily.      Marland Kitchen lidocaine-prilocaine (EMLA) cream Apply topically as needed.  30 g  0  . ondansetron (ZOFRAN) 8 MG tablet Take 1 tablet (8 mg total) by mouth every 8 (eight) hours as needed.  30 tablet  1  . predniSONE (DELTASONE) 50 MG tablet Take 2 tablets (100 mg total) by mouth daily. Take on days 1-5 of chemotherapy.  60 tablet  0  . prochlorperazine (COMPAZINE) 10 MG tablet Take 1 tablet (10 mg total) by mouth every 6 (six) hours as needed (Nausea or vomiting).  30 tablet  6   No current facility-administered medications for this visit.    REVIEW OF SYSTEMS:   Constitutional: Denies fevers, chills or abnormal weight loss Eyes: Denies blurriness of vision All other systems were reviewed with the patient and are negative.  PHYSICAL EXAMINATION: ECOG PERFORMANCE STATUS: 1 - Symptomatic but completely ambulatory  Filed Vitals:   07/10/13 1253  BP: 129/81  Pulse: 76  Temp: 97.1 F (36.2 C)  Resp: 18   Filed Weights   07/10/13 1253  Weight: 186 lb 6.4 oz (84.55 kg)    GENERAL:alert, no distress and comfortable. SKIN: skin color, texture, turgor are normal, no rashes or significant lesions EYES: normal, Conjunctiva are pink and non-injected, sclera clear OROPHARYNX:no exudate, no erythema and lips, buccal mucosa, and tongue normal  NECK: supple, thyroid normal size, non-tender, without nodularity LYMPH:  Significant palpable lymphadenopathy throughout unchanged compared to previous exam LUNGS: clear to auscultation and percussion with normal breathing effort HEART: regular rate & rhythm and no murmurs and no lower extremity edema ABDOMEN:abdomen soft, non-tender and normal bowel sounds Musculoskeletal:no cyanosis of digits and no clubbing Infuse-a-Port site  looks okay NEURO: alert & oriented x 3 with fluent speech, no focal motor/sensory deficits  LABORATORY DATA:  I have reviewed the data as listed    Component Value Date/Time   NA 140 07/03/2013 1149   NA 139 04/08/2013 1620   K 3.5 07/03/2013 1149   K 3.8 04/08/2013 1620   CL 101 04/08/2013 1620   CO2 26 07/03/2013 1149   CO2 31 04/08/2013 1620   GLUCOSE 94 07/03/2013 1149   GLUCOSE 92 04/08/2013 1620   BUN 15.7 07/03/2013 1149   BUN 21 04/08/2013 1620   CREATININE 1.1 07/03/2013 1149   CREATININE 0.96 04/08/2013 1620   CALCIUM 10.0 07/03/2013 1149   CALCIUM 9.9 04/08/2013 1620   PROT 7.8 07/03/2013 1149   PROT 7.6 04/08/2013 1620   ALBUMIN 4.5 07/03/2013 1149   ALBUMIN 4.5 04/08/2013 1620   AST 29 07/03/2013 1149   AST 24 04/08/2013 1620   ALT 18 07/03/2013 1149  ALT 16 04/08/2013 1620   ALKPHOS 77 07/03/2013 1149   ALKPHOS 79 04/08/2013 1620   BILITOT 0.94 07/03/2013 1149   BILITOT 0.4 04/08/2013 1620   GFRNONAA 60* 04/08/2013 1620   GFRAA 69* 04/08/2013 1620    No results found for this basename: SPEP, UPEP,  kappa and lambda light chains    Lab Results  Component Value Date   WBC 3.1* 07/08/2013   NEUTROABS 1.6 07/03/2013   HGB 13.3 07/08/2013   HCT 38.6 07/08/2013   MCV 80.9 07/08/2013   PLT 182 07/08/2013      Chemistry      Component Value Date/Time   NA 140 07/03/2013 1149   NA 139 04/08/2013 1620   K 3.5 07/03/2013 1149   K 3.8 04/08/2013 1620   CL 101 04/08/2013 1620   CO2 26 07/03/2013 1149   CO2 31 04/08/2013 1620   BUN 15.7 07/03/2013 1149   BUN 21 04/08/2013 1620   CREATININE 1.1 07/03/2013 1149   CREATININE 0.96 04/08/2013 1620      Component Value Date/Time   CALCIUM 10.0 07/03/2013 1149   CALCIUM 9.9 04/08/2013 1620   ALKPHOS 77 07/03/2013 1149   ALKPHOS 79 04/08/2013 1620   AST 29 07/03/2013 1149   AST 24 04/08/2013 1620   ALT 18 07/03/2013 1149   ALT 16 04/08/2013 1620   BILITOT 0.94 07/03/2013 1149   BILITOT 0.4 04/08/2013 1620        RADIOGRAPHIC STUDIES: I have personally reviewed the radiological images as listed and agreed with the findings in the report. Nm Pet Image Restag (ps) Skull Base To Thigh  07/09/2013   CLINICAL DATA:  Subsequent treatment strategy for lymphoma. High-grade follicular non-Hodgkin's lymphoma.  EXAM: NUCLEAR MEDICINE PET SKULL BASE TO THIGH  FASTING BLOOD GLUCOSE:  Value: 88mg /dl  TECHNIQUE: 16.1 mCi W-96 FDG was injected intravenously. CT data was obtained and used for attenuation correction and anatomic localization only. (This was not acquired as a diagnostic CT examination.) Additional exam technical data entered on technologist worksheet.  COMPARISON:  PET-CT scan 04/15/2013  FINDINGS: NECK  The intense hypermetabolic activity associated with multiple nodules in the parotid glands (greater on the left) is slightly increased compared to prior. Legrand Rams this to represent lymphoma involvement.  CHEST  Hypermetabolic right axillary lymph nodes on the not enlarged but are intense hypermetabolic and slightly increased compared to prior with SUV max = 11.9 compared to SUV max = 8.9.  ABDOMEN/PELVIS  The large intense hypermetabolic 4 .8 cm lesion within the inferior aspect the right hepatic lobe slightly increased metabolic activity with insert SUV 17.3 compared to SUV max = 15.3. There is a new lesion in the left hepatic lobe measures 17 mm (image 94) with SUV max = max 11.0  Hypermetabolic central mesenteric lymph nodes central mesenteries are slightly increased in activity with SUV max = 9.8 compared to SUV max = 8.6.  Enlarged left periaortic lymph node measuring 16 mm (image 120) with SUV max = 15.7compared to SUV max = 11.2  Again demonstrated multiple subcutaneous nodules are hypermetabolic. Exemplary subcutaneous nodule in the left flank measures SUV max = 8.7 (image 157) compared to SUV max = 6.9.  SKELETON  No focal hypermetabolic activity to suggest skeletal metastasis.  IMPRESSION: 1. Mild  progression of widespread high-grade lymphoma with interval mild increase in metabolic activity in widespread adenopathy, liver metastasis, a subcutaneous nodules.  2. Single new hypermetabolic lesion within the right hepatic lobe.   Electronically Signed  By: Genevive Bi M.D.   On: 07/09/2013 16:34     ASSESSMENT:  High grade follicular lymphoma  PLAN:  #1 high-grade follicular lymphoma As discussed with the patient before, we elected not to pursue clinical trial due to the possible need for repeat biopsy which could potentially delay further treatments. I recommend we proceed with R. CHOP chemotherapy. Her recent echocardiogram were within normal limits. Her most recent hepatitis B screening was negative. Due to potential risk of tumor lysis syndrome, the patient is on allopurinol. We discussed some of the risks, benefits and side-effects of Rituximab,Cytoxan, Adriamycin,Vincristine and Solumedrol/Prednisone. Some of the short term side-effects included, though not limited to, risk of fatigue, weight loss, tumor lysis syndrome, risk of allergic reactions, pancytopenia, life-threatening infections, need for transfusions of blood products, nausea, vomiting, change in bowel habits, hair loss, risk of congestive heart failure, admission to hospital for various reasons, and risks of death. Long term side-effects are also discussed including permanent damage to nerve function, chronic fatigue, and rare secondary malignancy including bone marrow disorders. The patient is aware that the response rates discussed earlier is not guaranteed.  After a long discussion, patient made an informed decision to proceed with the prescribed plan of care and went ahead to sign the consent form today.  We will proceed with chemotherapy next week followed by Neulasta support. I will see the patient back prior to cycle 2 of chemotherapy.  Orders Placed This Encounter  Procedures  . CBC with Differential     Standing Status: Future     Number of Occurrences:      Standing Expiration Date: 04/01/2014  . Comprehensive metabolic panel    Standing Status: Future     Number of Occurrences:      Standing Expiration Date: 07/10/2014  . Lactate dehydrogenase    Standing Status: Future     Number of Occurrences:      Standing Expiration Date: 07/10/2014   All questions were answered. The patient knows to call the clinic with any problems, questions or concerns. No barriers to learning was detected.    [facility], Shelena Castelluccio, MD 07/10/2013 7:24 PM

## 2013-07-14 ENCOUNTER — Telehealth: Payer: Self-pay | Admitting: Hematology and Oncology

## 2013-07-14 ENCOUNTER — Telehealth: Payer: Self-pay | Admitting: *Deleted

## 2013-07-14 NOTE — Telephone Encounter (Signed)
Email to MW to sch Tx 11/26 per 10/30 POF shh

## 2013-07-14 NOTE — Telephone Encounter (Signed)
Tx is 7 hrs long MD appt too late on 11/25 Email NG to advise if ok to have her appt day before chemo or what to do...shh

## 2013-07-14 NOTE — Telephone Encounter (Signed)
Email to Dr Bertis Ruddy POF req tx and OV on 11/26 and Dr Reece Agar schedule shows her on vac this date.  Awaiting response shh

## 2013-07-14 NOTE — Telephone Encounter (Signed)
Per staff message and POF I have scheduled appts.  JMW  

## 2013-07-15 ENCOUNTER — Telehealth: Payer: Self-pay | Admitting: Hematology and Oncology

## 2013-07-15 ENCOUNTER — Ambulatory Visit: Payer: Medicare Other | Admitting: Hematology and Oncology

## 2013-07-15 ENCOUNTER — Telehealth: Payer: Self-pay | Admitting: *Deleted

## 2013-07-15 NOTE — Telephone Encounter (Signed)
Pt had questions about how to take her nausea medication and other meds.  Instructed no need to take nausea med morning of chemotherapy, unless she feels nauseated.  Bring meds w/ her to review w/ chemo nurse prior to d/c.  Reviewed prednisone instructions days 1 thru 5 of chemotherapy.  Also instructed pt may take all her regular meds morning of chemo but Hold Blood Pressure medication morning of chemo only due to Rituxan ordered.  Encouraged good hydration, fluid intake. Pt verbalized understanding.

## 2013-07-15 NOTE — Telephone Encounter (Signed)
sw pt aware of 11/5, 6, 25, 26, 28 appts...shh

## 2013-07-16 ENCOUNTER — Ambulatory Visit: Payer: Medicare Other | Admitting: Hematology and Oncology

## 2013-07-16 ENCOUNTER — Ambulatory Visit (HOSPITAL_BASED_OUTPATIENT_CLINIC_OR_DEPARTMENT_OTHER): Payer: Medicare Other

## 2013-07-16 ENCOUNTER — Other Ambulatory Visit: Payer: Medicare Other | Admitting: Lab

## 2013-07-16 VITALS — BP 151/82 | HR 95 | Temp 98.0°F | Resp 16

## 2013-07-16 DIAGNOSIS — C8298 Follicular lymphoma, unspecified, lymph nodes of multiple sites: Secondary | ICD-10-CM | POA: Diagnosis not present

## 2013-07-16 DIAGNOSIS — Z5112 Encounter for antineoplastic immunotherapy: Secondary | ICD-10-CM

## 2013-07-16 DIAGNOSIS — Z5111 Encounter for antineoplastic chemotherapy: Secondary | ICD-10-CM

## 2013-07-16 DIAGNOSIS — C8239 Follicular lymphoma grade IIIa, extranodal and solid organ sites: Secondary | ICD-10-CM

## 2013-07-16 MED ORDER — SODIUM CHLORIDE 0.9 % IJ SOLN
10.0000 mL | INTRAMUSCULAR | Status: DC | PRN
  Administered 2013-07-16: 10 mL
  Filled 2013-07-16: qty 10

## 2013-07-16 MED ORDER — SODIUM CHLORIDE 0.9 % IV SOLN
750.0000 mg/m2 | Freq: Once | INTRAVENOUS | Status: AC
  Administered 2013-07-16: 1440 mg via INTRAVENOUS
  Filled 2013-07-16: qty 72

## 2013-07-16 MED ORDER — DIPHENHYDRAMINE HCL 25 MG PO CAPS
ORAL_CAPSULE | ORAL | Status: AC
  Filled 2013-07-16: qty 2

## 2013-07-16 MED ORDER — HEPARIN SOD (PORK) LOCK FLUSH 100 UNIT/ML IV SOLN
500.0000 [IU] | Freq: Once | INTRAVENOUS | Status: AC | PRN
  Administered 2013-07-16: 500 [IU]
  Filled 2013-07-16: qty 5

## 2013-07-16 MED ORDER — ACETAMINOPHEN 325 MG PO TABS
ORAL_TABLET | ORAL | Status: AC
  Filled 2013-07-16: qty 2

## 2013-07-16 MED ORDER — DIPHENHYDRAMINE HCL 25 MG PO CAPS
50.0000 mg | ORAL_CAPSULE | Freq: Once | ORAL | Status: AC
  Administered 2013-07-16: 50 mg via ORAL

## 2013-07-16 MED ORDER — ACETAMINOPHEN 325 MG PO TABS
650.0000 mg | ORAL_TABLET | Freq: Once | ORAL | Status: AC
  Administered 2013-07-16: 650 mg via ORAL

## 2013-07-16 MED ORDER — VINCRISTINE SULFATE CHEMO INJECTION 1 MG/ML
2.0000 mg | Freq: Once | INTRAVENOUS | Status: AC
  Administered 2013-07-16: 2 mg via INTRAVENOUS
  Filled 2013-07-16: qty 2

## 2013-07-16 MED ORDER — DEXAMETHASONE SODIUM PHOSPHATE 20 MG/5ML IJ SOLN
20.0000 mg | Freq: Once | INTRAMUSCULAR | Status: AC
  Administered 2013-07-16: 20 mg via INTRAVENOUS

## 2013-07-16 MED ORDER — DOXORUBICIN HCL CHEMO IV INJECTION 2 MG/ML
50.0000 mg/m2 | Freq: Once | INTRAVENOUS | Status: AC
  Administered 2013-07-16: 96 mg via INTRAVENOUS
  Filled 2013-07-16: qty 48

## 2013-07-16 MED ORDER — SODIUM CHLORIDE 0.9 % IV SOLN
375.0000 mg/m2 | Freq: Once | INTRAVENOUS | Status: AC
  Administered 2013-07-16: 700 mg via INTRAVENOUS
  Filled 2013-07-16: qty 70

## 2013-07-16 MED ORDER — DEXAMETHASONE SODIUM PHOSPHATE 20 MG/5ML IJ SOLN
INTRAMUSCULAR | Status: AC
  Filled 2013-07-16: qty 5

## 2013-07-16 MED ORDER — ONDANSETRON 16 MG/50ML IVPB (CHCC)
INTRAVENOUS | Status: AC
  Filled 2013-07-16: qty 16

## 2013-07-16 MED ORDER — ONDANSETRON 16 MG/50ML IVPB (CHCC)
16.0000 mg | Freq: Once | INTRAVENOUS | Status: AC
  Administered 2013-07-16: 16 mg via INTRAVENOUS

## 2013-07-16 MED ORDER — SODIUM CHLORIDE 0.9 % IV SOLN
Freq: Once | INTRAVENOUS | Status: AC
  Administered 2013-07-16: 10:00:00 via INTRAVENOUS

## 2013-07-16 NOTE — Patient Instructions (Signed)
[facility] Discharge Instructions for Patients Receiving Chemotherapy  Today you received the following chemotherapy agents Adriamycin, Vincristine, Cytoxan and Rituxan  To help prevent nausea and vomiting after your treatment, we encourage you to take your nausea medication as prescribed   If you develop nausea and vomiting that is not controlled by your nausea medication, call the clinic.   BELOW ARE SYMPTOMS THAT SHOULD BE REPORTED IMMEDIATELY:  *FEVER GREATER THAN 100.5 F  *CHILLS WITH OR WITHOUT FEVER  NAUSEA AND VOMITING THAT IS NOT CONTROLLED WITH YOUR NAUSEA MEDICATION  *UNUSUAL SHORTNESS OF BREATH  *UNUSUAL BRUISING OR BLEEDING  TENDERNESS IN MOUTH AND THROAT WITH OR WITHOUT PRESENCE OF ULCERS  *URINARY PROBLEMS  *BOWEL PROBLEMS  UNUSUAL RASH Items with * indicate a potential emergency and should be followed up as soon as possible.  Feel free to call the clinic you have any questions or concerns. The clinic phone number is [phone].    

## 2013-07-17 ENCOUNTER — Telehealth: Payer: Self-pay | Admitting: *Deleted

## 2013-07-17 ENCOUNTER — Ambulatory Visit (HOSPITAL_BASED_OUTPATIENT_CLINIC_OR_DEPARTMENT_OTHER): Payer: Medicare Other

## 2013-07-17 VITALS — BP 147/83 | HR 68 | Temp 98.7°F | Resp 18

## 2013-07-17 DIAGNOSIS — C8299 Follicular lymphoma, unspecified, extranodal and solid organ sites: Secondary | ICD-10-CM

## 2013-07-17 DIAGNOSIS — Z5189 Encounter for other specified aftercare: Secondary | ICD-10-CM | POA: Diagnosis not present

## 2013-07-17 DIAGNOSIS — C8239 Follicular lymphoma grade IIIa, extranodal and solid organ sites: Secondary | ICD-10-CM

## 2013-07-17 MED ORDER — PEGFILGRASTIM INJECTION 6 MG/0.6ML
6.0000 mg | Freq: Once | SUBCUTANEOUS | Status: AC
  Administered 2013-07-17: 6 mg via SUBCUTANEOUS
  Filled 2013-07-17: qty 0.6

## 2013-07-17 NOTE — Telephone Encounter (Signed)
Pt here for Neulasta injection post chemo.  Per pt, she is feeling fine today.  Pt stated she had a slight headache last night but resolved quickly.  Pt wanted to know if she could take Tylenol for headache.  Pt stated she did not take her blood pressure med last night after chemo.  Informed pt that it is ok for pt to take Tylenol for mild discomfort.  Went over instructions on how to take antiemetics with pt and husband.  Pt stated her appetite is good, drinking lots of fluids as tolerated.  Stated bowel and bladder function fine.  Pt understood to call office with any new problems.

## 2013-07-17 NOTE — Patient Instructions (Signed)

## 2013-07-18 ENCOUNTER — Telehealth: Payer: Self-pay | Admitting: *Deleted

## 2013-07-18 ENCOUNTER — Encounter: Payer: Self-pay | Admitting: Hematology and Oncology

## 2013-07-18 ENCOUNTER — Other Ambulatory Visit: Payer: Self-pay | Admitting: Hematology and Oncology

## 2013-07-18 DIAGNOSIS — K1379 Other lesions of oral mucosa: Secondary | ICD-10-CM

## 2013-07-18 MED ORDER — MAGIC MOUTHWASH W/LIDOCAINE: 5.0000 mL | Freq: Four times a day (QID) | ORAL | Status: DC

## 2013-07-18 NOTE — Telephone Encounter (Signed)
Informed pt of Rx for magic mouthwash sent to her pharmacy and to call back on Monday if not improved or worse. Pt verbalized understanding.

## 2013-07-18 NOTE — Telephone Encounter (Signed)
Pt reports mouth sore on roof of mouth and one on her tongue.  States she was told to call for any mouth sores.  She is using biotene mouth wash.

## 2013-07-18 NOTE — Telephone Encounter (Signed)
I wrote a script for magic mouth wash to  Local pharmacy Please let her know and try If not response, call again Monday morning and I will see her

## 2013-07-23 ENCOUNTER — Ambulatory Visit (HOSPITAL_BASED_OUTPATIENT_CLINIC_OR_DEPARTMENT_OTHER): Payer: Medicare Other | Admitting: Lab

## 2013-07-23 ENCOUNTER — Encounter: Payer: Medicare Other | Admitting: Hematology and Oncology

## 2013-07-23 ENCOUNTER — Inpatient Hospital Stay (HOSPITAL_COMMUNITY): Payer: Medicare Other

## 2013-07-23 ENCOUNTER — Inpatient Hospital Stay (HOSPITAL_COMMUNITY)
Admission: AD | Admit: 2013-07-23 | Discharge: 2013-07-27 | DRG: 809 | Disposition: A | Discharge status: Home / Self Care | Payer: Medicare Other | Source: Ambulatory Visit | Attending: Hematology and Oncology | Admitting: Hematology and Oncology

## 2013-07-23 ENCOUNTER — Encounter: Payer: Self-pay | Admitting: Hematology and Oncology

## 2013-07-23 ENCOUNTER — Other Ambulatory Visit: Payer: Self-pay | Admitting: Hematology and Oncology

## 2013-07-23 ENCOUNTER — Telehealth: Payer: Self-pay | Admitting: *Deleted

## 2013-07-23 ENCOUNTER — Encounter (HOSPITAL_COMMUNITY): Payer: Self-pay | Admitting: *Deleted

## 2013-07-23 VITALS — BP 123/74 | HR 108 | Temp 100.1°F | Resp 18 | Ht 62.0 in | Wt 178.6 lb

## 2013-07-23 DIAGNOSIS — R5081 Fever presenting with conditions classified elsewhere: Secondary | ICD-10-CM | POA: Diagnosis not present

## 2013-07-23 DIAGNOSIS — D696 Thrombocytopenia, unspecified: Secondary | ICD-10-CM

## 2013-07-23 DIAGNOSIS — K121 Other forms of stomatitis: Secondary | ICD-10-CM

## 2013-07-23 DIAGNOSIS — I2510 Atherosclerotic heart disease of native coronary artery without angina pectoris: Secondary | ICD-10-CM | POA: Diagnosis present

## 2013-07-23 DIAGNOSIS — Z8249 Family history of ischemic heart disease and other diseases of the circulatory system: Secondary | ICD-10-CM | POA: Diagnosis not present

## 2013-07-23 DIAGNOSIS — C8299 Follicular lymphoma, unspecified, extranodal and solid organ sites: Secondary | ICD-10-CM

## 2013-07-23 DIAGNOSIS — Z9221 Personal history of antineoplastic chemotherapy: Secondary | ICD-10-CM | POA: Diagnosis not present

## 2013-07-23 DIAGNOSIS — K1379 Other lesions of oral mucosa: Secondary | ICD-10-CM

## 2013-07-23 DIAGNOSIS — E860 Dehydration: Secondary | ICD-10-CM

## 2013-07-23 DIAGNOSIS — E871 Hypo-osmolality and hyponatremia: Secondary | ICD-10-CM | POA: Diagnosis present

## 2013-07-23 DIAGNOSIS — C8239 Follicular lymphoma grade IIIa, extranodal and solid organ sites: Secondary | ICD-10-CM

## 2013-07-23 DIAGNOSIS — D61810 Antineoplastic chemotherapy induced pancytopenia: Principal | ICD-10-CM | POA: Diagnosis present

## 2013-07-23 DIAGNOSIS — Z8051 Family history of malignant neoplasm of kidney: Secondary | ICD-10-CM | POA: Diagnosis not present

## 2013-07-23 DIAGNOSIS — E876 Hypokalemia: Secondary | ICD-10-CM | POA: Diagnosis not present

## 2013-07-23 DIAGNOSIS — Z7982 Long term (current) use of aspirin: Secondary | ICD-10-CM

## 2013-07-23 DIAGNOSIS — Z800 Family history of malignant neoplasm of digestive organs: Secondary | ICD-10-CM | POA: Diagnosis not present

## 2013-07-23 DIAGNOSIS — E079 Disorder of thyroid, unspecified: Secondary | ICD-10-CM | POA: Diagnosis present

## 2013-07-23 DIAGNOSIS — Z79899 Other long term (current) drug therapy: Secondary | ICD-10-CM | POA: Diagnosis not present

## 2013-07-23 DIAGNOSIS — G4700 Insomnia, unspecified: Secondary | ICD-10-CM

## 2013-07-23 DIAGNOSIS — C8298 Follicular lymphoma, unspecified, lymph nodes of multiple sites: Secondary | ICD-10-CM | POA: Diagnosis not present

## 2013-07-23 DIAGNOSIS — Z823 Family history of stroke: Secondary | ICD-10-CM | POA: Diagnosis not present

## 2013-07-23 DIAGNOSIS — I10 Essential (primary) hypertension: Secondary | ICD-10-CM | POA: Diagnosis present

## 2013-07-23 DIAGNOSIS — K219 Gastro-esophageal reflux disease without esophagitis: Secondary | ICD-10-CM | POA: Diagnosis present

## 2013-07-23 DIAGNOSIS — E785 Hyperlipidemia, unspecified: Secondary | ICD-10-CM | POA: Diagnosis present

## 2013-07-23 DIAGNOSIS — D6959 Other secondary thrombocytopenia: Secondary | ICD-10-CM | POA: Diagnosis not present

## 2013-07-23 DIAGNOSIS — D709 Neutropenia, unspecified: Secondary | ICD-10-CM

## 2013-07-23 DIAGNOSIS — R509 Fever, unspecified: Secondary | ICD-10-CM

## 2013-07-23 DIAGNOSIS — T451X5A Adverse effect of antineoplastic and immunosuppressive drugs, initial encounter: Principal | ICD-10-CM | POA: Diagnosis present

## 2013-07-23 DIAGNOSIS — D6481 Anemia due to antineoplastic chemotherapy: Secondary | ICD-10-CM | POA: Diagnosis not present

## 2013-07-23 LAB — CBC WITH DIFFERENTIAL/PLATELET
BASO%: 2.1 % — ABNORMAL HIGH (ref 0.0–2.0)
EOS%: 2.1 % (ref 0.0–7.0)
HCT: 37.7 % (ref 34.8–46.6)
HGB: 13 g/dL (ref 11.6–15.9)
LYMPH%: 48.9 % (ref 14.0–49.7)
MCH: 28 pg (ref 25.1–34.0)
MCHC: 34.5 g/dL (ref 31.5–36.0)
MONO#: 0.1 10*3/uL (ref 0.1–0.9)
NEUT%: 23.5 % — ABNORMAL LOW (ref 38.4–76.8)
RDW: 13.7 % (ref 11.2–14.5)
WBC: 0.5 10*3/uL — CL (ref 3.9–10.3)
lymph#: 0.2 10*3/uL — ABNORMAL LOW (ref 0.9–3.3)

## 2013-07-23 LAB — COMPREHENSIVE METABOLIC PANEL (CC13)
ALT: 38 U/L (ref 0–55)
AST: 25 U/L (ref 5–34)
Albumin: 4 g/dL (ref 3.5–5.0)
Anion Gap: 10 mEq/L (ref 3–11)
CO2: 25 mEq/L (ref 22–29)
Calcium: 9.6 mg/dL (ref 8.4–10.4)
Chloride: 96 mEq/L — ABNORMAL LOW (ref 98–109)
Creatinine: 1.2 mg/dL — ABNORMAL HIGH (ref 0.6–1.1)
Potassium: 3.8 mEq/L (ref 3.5–5.1)
Total Protein: 7.3 g/dL (ref 6.4–8.3)

## 2013-07-23 LAB — URINALYSIS, MICROSCOPIC - CHCC
Blood: NEGATIVE
Glucose: NEGATIVE mg/dL
Nitrite: NEGATIVE
Protein: <30 mg/dL
Specific Gravity, Urine: 1.015 (ref 1.003–1.035)
Urobilinogen, UR: 0.2 mg/dL (ref 0.2–1)
pH: 6 (ref 4.6–8.0)

## 2013-07-23 MED ORDER — VANCOMYCIN HCL IN DEXTROSE 1-5 GM/200ML-% IV SOLN
1000.0000 mg | INTRAVENOUS | Status: AC
  Administered 2013-07-23: 1000 mg via INTRAVENOUS
  Filled 2013-07-23: qty 200

## 2013-07-23 MED ORDER — LIDOCAINE-PRILOCAINE 2.5-2.5 % EX CREA: TOPICAL_CREAM | Freq: Once | CUTANEOUS | Status: DC

## 2013-07-23 MED ORDER — PANTOPRAZOLE SODIUM 20 MG PO TBEC
20.0000 mg | DELAYED_RELEASE_TABLET | Freq: Every day | ORAL | Status: DC
  Administered 2013-07-23 – 2013-07-27 (×5): 20 mg via ORAL
  Filled 2013-07-23 (×5): qty 1

## 2013-07-23 MED ORDER — ENOXAPARIN SODIUM 40 MG/0.4ML ~~LOC~~ SOLN
40.0000 mg | SUBCUTANEOUS | Status: DC
  Administered 2013-07-23 – 2013-07-26 (×4): 40 mg via SUBCUTANEOUS
  Filled 2013-07-23 (×5): qty 0.4

## 2013-07-23 MED ORDER — PROCHLORPERAZINE MALEATE 10 MG PO TABS: 10.0000 mg | ORAL_TABLET | Freq: Four times a day (QID) | ORAL | Status: DC | PRN

## 2013-07-23 MED ORDER — DEXTROSE 5 % IV SOLN
2.0000 g | Freq: Three times a day (TID) | INTRAVENOUS | Status: DC
  Administered 2013-07-24 – 2013-07-26 (×7): 2 g via INTRAVENOUS
  Filled 2013-07-23 (×8): qty 2

## 2013-07-23 MED ORDER — DEXTROSE 5 % IV SOLN
2.0000 g | INTRAVENOUS | Status: AC
  Administered 2013-07-23: 2 g via INTRAVENOUS
  Filled 2013-07-23: qty 2

## 2013-07-23 MED ORDER — ZOLPIDEM TARTRATE 5 MG PO TABS
5.0000 mg | ORAL_TABLET | Freq: Every evening | ORAL | Status: DC | PRN
  Administered 2013-07-25: 2.5 mg via ORAL
  Filled 2013-07-23: qty 1

## 2013-07-23 MED ORDER — ONDANSETRON HCL 8 MG PO TABS: 8.0000 mg | ORAL_TABLET | Freq: Three times a day (TID) | ORAL | Status: DC | PRN

## 2013-07-23 MED ORDER — PROCHLORPERAZINE EDISYLATE 5 MG/ML IJ SOLN: 10.0000 mg | Freq: Four times a day (QID) | INTRAMUSCULAR | Status: DC | PRN

## 2013-07-23 MED ORDER — ASPIRIN 81 MG PO CHEW
81.0000 mg | CHEWABLE_TABLET | Freq: Every day | ORAL | Status: DC
  Administered 2013-07-23 – 2013-07-26 (×4): 81 mg via ORAL
  Filled 2013-07-23 (×5): qty 1

## 2013-07-23 MED ORDER — ACETAMINOPHEN 325 MG PO TABS
650.0000 mg | ORAL_TABLET | ORAL | Status: DC | PRN
  Administered 2013-07-23 – 2013-07-25 (×4): 650 mg via ORAL
  Filled 2013-07-23 (×5): qty 2

## 2013-07-23 MED ORDER — ALPRAZOLAM 0.25 MG PO TABS: 0.2500 mg | ORAL_TABLET | Freq: Every evening | ORAL | Status: DC | PRN

## 2013-07-23 MED ORDER — FILGRASTIM 300 MCG/ML IJ SOLN
300.0000 ug | Freq: Every day | INTRAMUSCULAR | Status: DC
  Administered 2013-07-23 – 2013-07-24 (×2): 300 ug via SUBCUTANEOUS
  Filled 2013-07-23 (×4): qty 1

## 2013-07-23 MED ORDER — VANCOMYCIN HCL IN DEXTROSE 750-5 MG/150ML-% IV SOLN
750.0000 mg | Freq: Two times a day (BID) | INTRAVENOUS | Status: DC
  Administered 2013-07-24 – 2013-07-25 (×3): 750 mg via INTRAVENOUS
  Filled 2013-07-23 (×5): qty 150

## 2013-07-23 MED ORDER — VITAMIN D 50 MCG (2000 UT) PO TABS: 2000.0000 [IU] | ORAL_TABLET | Freq: Every day | ORAL | Status: DC

## 2013-07-23 MED ORDER — SODIUM CHLORIDE 0.9 % IV SOLN
INTRAVENOUS | Status: DC
  Administered 2013-07-23 – 2013-07-26 (×4): via INTRAVENOUS

## 2013-07-23 MED ORDER — DEXTROSE 5 % IV SOLN: 1.0000 g | Freq: Two times a day (BID) | INTRAVENOUS | Status: DC

## 2013-07-23 MED ORDER — MAGIC MOUTHWASH W/LIDOCAINE
5.0000 mL | Freq: Four times a day (QID) | ORAL | Status: DC
  Administered 2013-07-23 – 2013-07-27 (×14): 5 mL via ORAL
  Filled 2013-07-23 (×18): qty 5

## 2013-07-23 MED ORDER — ALLOPURINOL 300 MG PO TABS
300.0000 mg | ORAL_TABLET | Freq: Every day | ORAL | Status: DC
  Administered 2013-07-24 – 2013-07-27 (×4): 300 mg via ORAL
  Filled 2013-07-23 (×4): qty 1

## 2013-07-23 MED ORDER — ASPIRIN 81 MG PO TABS: 81.0000 mg | ORAL_TABLET | Freq: Every day | ORAL | Status: DC

## 2013-07-23 MED ORDER — ALUM & MAG HYDROXIDE-SIMETH 200-200-20 MG/5ML PO SUSP: 60.0000 mL | ORAL | Status: DC | PRN

## 2013-07-23 MED ORDER — VITAMIN D3 25 MCG (1000 UNIT) PO TABS
2000.0000 [IU] | ORAL_TABLET | Freq: Every day | ORAL | Status: DC
  Administered 2013-07-24 – 2013-07-27 (×4): 2000 [IU] via ORAL
  Filled 2013-07-23 (×5): qty 2

## 2013-07-23 MED ORDER — LORATADINE 10 MG PO TABS: 10.0000 mg | ORAL_TABLET | Freq: Every day | ORAL | Status: DC

## 2013-07-23 MED ORDER — VANCOMYCIN HCL 10 G IV SOLR: 1250.0000 mg | Freq: Two times a day (BID) | INTRAVENOUS | Status: DC

## 2013-07-23 MED ORDER — SENNOSIDES-DOCUSATE SODIUM 8.6-50 MG PO TABS
1.0000 | ORAL_TABLET | Freq: Two times a day (BID) | ORAL | Status: DC | PRN
  Administered 2013-07-23: 1 via ORAL
  Filled 2013-07-23: qty 1

## 2013-07-23 NOTE — H&P (Signed)
[facility] CONSULT NOTE  Patient Care Team: Mirna Mires, MD as PCP - General (Family Medicine) Artis Delay, MD as Consulting Physician (Hematology and Oncology)  CHIEF COMPLAINTS/PURPOSE OF CONSULTATION:  Neutropenic fever  HISTORY OF PRESENTING ILLNESS:  [name] 67 y.o. female is here because of neutropenic fever. The patient had background history of follicular lymphoma. On 07/16/2013, she received cycle 1 of chemotherapy with R. CHOP. Last week, the patient called stating that she has some mild stores. I call in prescription for Magic mouthwash. This morning, she sought to feel unwell with some dizziness and high grade temperature of 104 Fahrenheit She denies any cough. No recent diarrhea. Denies any recent nausea or vomiting. She felt that her urine output has reduced despite drinking plenty of fluid. The patient denies any recent signs or symptoms of bleeding such as spontaneous epistaxis, hematuria or hematochezia.   MEDICAL HISTORY:  Past Medical History  Diagnosis Date  . CAD (coronary artery disease)   . GERD (gastroesophageal reflux disease)   . HTN (hypertension)   . Hyperlipidemia   . Enlargement of lymph nodes 07/26/2011  . 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 Lymphoma   . Tick bite of knee   . Thyroid disease   . Follicular lymphoma 06/27/2013  . 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  B-cell, favor follicular NHL.  CD79a, CD20 and CD10 positive.  Cyclin D1 negative.   . Mouth sores 07/18/2013  . Fever 07/23/2013    SURGICAL HISTORY: Past Surgical History  Procedure Laterality Date  . Mr breast bilateral  cyst removal  . Abdominal hysterectomy    . Lymph node dissection  x 3 different times    behind left ear 2012/ left clavical area  . Tubal ligation    . Knee arthroscopy w/ debridement  05/2011    right  .  Bone marrow biopsy  05/09/2013  . Bone marrow aspiration  05/09/2013    SOCIAL HISTORY: History   Social History  . Marital Status: Married    Spouse Name: N/A    Number of Children: N/A  . Years of Education: N/A   Occupational History  . [employer]    Social History Main Topics  . Smoking status: Never Smoker   . Smokeless tobacco: Never Used  . Alcohol Use: No  . Drug Use: No  . Sexual Activity: No   Other Topics Concern  . Not on file   Social History Narrative  . No narrative on file    FAMILY HISTORY: Family History  Problem Relation Age of Onset  . Cancer    . Stroke    . Heart disease    . Coronary artery disease    . Cancer Mother     breast  . Stroke Mother   . Cancer Father     pancreatic cancer  . Cancer Brother     kidney cancer/brain tumor    ALLERGIES:  is allergic to furosemide and latex.  MEDICATIONS:  Current Facility-Administered Medications  Medication Dose Route Frequency Provider Last Rate Last Dose  . allopurinol (ZYLOPRIM) tablet 300 mg  300 mg Oral Daily Artis Delay, MD      . ALPRAZolam Prudy Feeler) tablet 0.25 mg  0.25 mg Oral QHS PRN Artis Delay, MD      . aspirin tablet 81 mg  81 mg Oral QHS  Artis Delay, MD      . lidocaine-prilocaine (EMLA) cream   Topical Once Artis Delay, MD      . loratadine (CLARITIN) tablet 10 mg  10 mg Oral Daily Artis Delay, MD      . magic mouthwash w/lidocaine  5 mL Oral QID Artis Delay, MD      . ondansetron (ZOFRAN) tablet 8 mg  8 mg Oral Q8H PRN Artis Delay, MD      . pantoprazole (PROTONIX) EC tablet 20 mg  20 mg Oral Daily Artis Delay, MD      . Vitamin D 2,000 Units  2,000 Units Oral Daily Artis Delay, MD        REVIEW OF SYSTEMS:   Constitutional: Denies chills or abnormal night sweats Eyes: Denies blurriness of vision, double vision or watery eyes Respiratory: Denies cough, dyspnea or wheezes Cardiovascular: Denies palpitation, chest discomfort or lower extremity swelling Gastrointestinal:  Denies  nausea, heartburn or change in bowel habits Skin: Denies abnormal skin rashes Lymphatics: Denies new lymphadenopathy or easy bruising Neurological:Denies numbness, tingling or new weaknesses Behavioral/Psych: Mood is stable, no new changes  All other systems were reviewed with the patient and are negative.  PHYSICAL EXAMINATION: ECOG PERFORMANCE STATUS: 1 - Symptomatic but completely ambulatory  There were no vitals filed for this visit. There were no vitals filed for this visit.  GENERAL:alert, no distress and comfortable SKIN: skin color, texture, turgor are normal, no rashes or significant lesions EYES: normal, conjunctiva are pink and non-injected, sclera clear OROPHARYNX:no exudate, no erythema and lips, buccal mucosa, and tongue normal . Very mild grade 1 mucositis with no associated thrush NECK: supple, thyroid normal size, non-tender, without nodularity LYMPH:  no palpable lymphadenopathy in the cervical, axillary or inguinal LUNGS: clear to auscultation and percussion with normal breathing effort HEART: regular rate & rhythm and no murmurs and no lower extremity edema ABDOMEN:abdomen soft, non-tender and normal bowel sounds Musculoskeletal:no cyanosis of digits and no clubbing  PSYCH: alert & oriented x 3 with fluent speech NEURO: no focal motor/sensory deficits  LABORATORY DATA:  Lab Results  Component Value Date   WBC 0.5* 07/23/2013   HGB 13.0 07/23/2013   HCT 37.7 07/23/2013   MCV 81.3 07/23/2013   PLT 83* 07/23/2013   ASSESSMENT:  Neutropenic fever, on background history of recent chemotherapy  PLAN:  #1 follicular lymphoma She received cycle 1 of chemotherapy and the lymphadenopathy has regressed. We will continue aggressive supportive care #2 neutropenic fever This is due to recent chemotherapy. I will start Neupogen daily along with broad-spectrum intravenous antibiotics with vancomycin and cefepime #3 dehydration The patient has reduced urine output  likely secondary to infection and mild dehydration. I will continue IV fluids. #4 mucositis We will continue Magic mouthwash as needed #5 thrombocytopenia This is due to recent chemotherapy. She has no bleeding complication. We will observe. #6 DVT prophylaxis We will start her on Lovenox. If the platelet count dropped to less than 50,000 I would discontinue Lovenox. Estimated length of stay would be 3-5 days due to severity of neutropenic fever. No orders of the defined types were placed in this encounter.    All questions were answered. The patient knows to call the clinic with any problems, questions or concerns.    Bralon Antkowiak, MD @T @ 4:48 PM

## 2013-07-23 NOTE — Progress Notes (Signed)
ANTIBIOTIC CONSULT NOTE - INITIAL  Pharmacy Consult for Vancomycin and Cefepime Indication: Febrile neutropenia  Allergies  Allergen Reactions  . Furosemide Other (See Comments)    Makes her feel very weak  . Latex Swelling and Rash    Patient Measurements: Height: 5\' 2"  (157.5 cm) Weight: 178 lb 9.6 oz (81.012 kg) IBW/kg (Calculated) : 50.1  Vital Signs: Temp: 100.2 F (37.9 C) (11/12 1635) Temp src: Oral (11/12 1635) BP: 134/69 mmHg (11/12 1635) Pulse Rate: 100 (11/12 1635) Intake/Output from previous day:   Intake/Output from this shift:    Labs:  Recent Labs  07/23/13 1523 07/23/13 1523  WBC 0.5*  --   HGB 13.0  --   PLT 83*  --   CREATININE  --  1.2*   Estimated Creatinine Clearance: 44.9 ml/min (by C-G formula based on Cr of 1.2). 51 ml/min/1.24m2 (normalized)   Microbiology: No results found for this or any previous visit (from the past 720 hour(s)).  Medical History: Past Medical History  Diagnosis Date  . CAD (coronary artery disease)   . GERD (gastroesophageal reflux disease)   . HTN (hypertension)   . Hyperlipidemia   . Enlargement of lymph nodes 07/26/2011  . 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 Lymphoma   . Tick bite of knee   . Thyroid disease   . Follicular lymphoma 06/27/2013  . 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  B-cell, favor follicular NHL.  CD79a, CD20 and CD10 positive.  Cyclin D1 negative.   . Mouth sores 07/18/2013  . Fever 07/23/2013    Medications:  Scheduled:  . allopurinol  300 mg Oral Daily  . aspirin  81 mg Oral QHS  . ceFEPime (MAXIPIME) IV  2 g Intravenous STAT  . [START ON 07/24/2013] ceFEPime (MAXIPIME) IV  2 g Intravenous Q8H  . enoxaparin (LOVENOX) injection  40 mg Subcutaneous Q24H  . filgrastim  300 mcg Subcutaneous q1800  . lidocaine-prilocaine   Topical Once  . loratadine  10  mg Oral Daily  . magic mouthwash w/lidocaine  5 mL Oral QID  . pantoprazole  20 mg Oral Daily  . vancomycin  1,000 mg Intravenous STAT  . [START ON 07/24/2013] vancomycin  750 mg Intravenous Q12H  . Vitamin D  2,000 Units Oral Daily   Infusions:  . sodium chloride 75 mL/hr at 07/23/13 1739   Assessment: 67 yo female with follicular lymphoma s/p 1st cycle of R-CHOP on 11/15. Presents today 11/12 with febrile neutropenia.  Goal of Therapy:  Vancomycin trough level 15-20 mcg/ml Cefepime dose per renal function  Plan:   Vancomycin 1g IV STAT, then 750mg  IV q12h Check trough at steady state Cefepime 2g IV q8h Follow up renal function & cultures  Loralee Pacas, PharmD, BCPS Pager: [phone] 07/23/2013,6:17 PM

## 2013-07-23 NOTE — Progress Notes (Signed)
See dictated H& P This encounter was created in error - please disregard.

## 2013-07-23 NOTE — Telephone Encounter (Signed)
Husband called stating patient has a temp of 103.3 has not been feeling well. To see Dr Bertis Ruddy at 3:15 today with labs at 2:45. Verbalized understanding

## 2013-07-24 DIAGNOSIS — C8299 Follicular lymphoma, unspecified, extranodal and solid organ sites: Secondary | ICD-10-CM | POA: Diagnosis not present

## 2013-07-24 DIAGNOSIS — E860 Dehydration: Secondary | ICD-10-CM | POA: Diagnosis not present

## 2013-07-24 DIAGNOSIS — N179 Acute kidney failure, unspecified: Secondary | ICD-10-CM

## 2013-07-24 DIAGNOSIS — R5081 Fever presenting with conditions classified elsewhere: Secondary | ICD-10-CM | POA: Diagnosis not present

## 2013-07-24 DIAGNOSIS — D709 Neutropenia, unspecified: Secondary | ICD-10-CM | POA: Diagnosis not present

## 2013-07-24 DIAGNOSIS — D649 Anemia, unspecified: Secondary | ICD-10-CM

## 2013-07-24 LAB — COMPREHENSIVE METABOLIC PANEL
ALT: 30 U/L (ref 0–35)
AST: 19 U/L (ref 0–37)
CO2: 25 mEq/L (ref 19–32)
Chloride: 98 mEq/L (ref 96–112)
Creatinine, Ser: 1.11 mg/dL — ABNORMAL HIGH (ref 0.50–1.10)
GFR calc Af Amer: 58 mL/min — ABNORMAL LOW (ref >90)
GFR calc non Af Amer: 50 mL/min — ABNORMAL LOW (ref >90)
Glucose, Bld: 116 mg/dL — ABNORMAL HIGH (ref 70–99)
Sodium: 135 mEq/L (ref 135–145)
Total Bilirubin: 1.1 mg/dL (ref 0.3–1.2)

## 2013-07-24 LAB — CBC WITH DIFFERENTIAL/PLATELET
Basophils Relative: 0 % (ref 0–1)
Eosinophils Relative: 2 % (ref 0–5)
Hemoglobin: 11.9 g/dL — ABNORMAL LOW (ref 12.0–15.0)
Lymphs Abs: 0.4 10*3/uL — ABNORMAL LOW (ref 0.7–4.0)
MCH: 28.4 pg (ref 26.0–34.0)
MCV: 81.1 fL (ref 78.0–100.0)
Monocytes Relative: 23 % — ABNORMAL HIGH (ref 3–12)
Neutrophils Relative %: 44 % (ref 43–77)
Platelets: 87 10*3/uL — ABNORMAL LOW (ref 150–400)
RBC: 4.19 MIL/uL (ref 3.87–5.11)
WBC: 1.2 10*3/uL — CL (ref 4.0–10.5)

## 2013-07-24 NOTE — Progress Notes (Signed)
WBC 1.2 today patient on neupogen

## 2013-07-24 NOTE — Progress Notes (Signed)
[name]   DOB:[birth year]   ZO#:[id number]    Subjective: The patient is feeling a bit better. She denies any further fevers or chills. Yesterday, she was feeling very weak. Today she felt a bit stronger.The patient denies any recent signs or symptoms of bleeding such as spontaneous epistaxis, hematuria or hematochezia.  Objective:  Filed Vitals:   07/24/13 0552  BP: 120/81  Pulse: 72  Temp: 97.7 F (36.5 C)  Resp: 17     Intake/Output Summary (Last 24 hours) at 07/24/13 0810 Last data filed at 07/24/13 0217  Gross per 24 hour  Intake    865 ml  Output    400 ml  Net    465 ml    GENERAL:alert, no distress and comfortable SKIN: skin color, texture, turgor are normal, no rashes or significant lesions EYES: normal, Conjunctiva are pink and non-injected, sclera clear OROPHARYNX:no exudate, no erythema and lips, buccal mucosa, and tongue normal  NECK: supple, thyroid normal size, non-tender, without nodularity LYMPH:  no palpable lymphadenopathy in the cervical, axillary or inguinal LUNGS: clear to auscultation and percussion with normal breathing effort HEART: regular rate & rhythm and no murmurs and no lower extremity edema ABDOMEN:abdomen soft, non-tender and normal bowel sounds Musculoskeletal:no cyanosis of digits and no clubbing  NEURO: alert & oriented x 3 with fluent speech, no focal motor/sensory deficits   Labs:  Lab Results  Component Value Date   WBC 1.2* 07/24/2013   HGB 11.9* 07/24/2013   HCT 34.0* 07/24/2013   MCV 81.1 07/24/2013   PLT 87* 07/24/2013   NEUTROABS 0.5* 07/24/2013   Lab Results  Component Value Date   CREATININE 1.11* 07/24/2013   BUN 22 07/24/2013   NA 135 07/24/2013   K 3.7 07/24/2013   CL 98 07/24/2013   CO2 25 07/24/2013    Studies:  X-ray Chest Pa And Lateral   07/23/2013   CLINICAL DATA:  Fever  EXAM: CHEST  2 VIEW  COMPARISON:  09/02/2012, 07/09/2013  FINDINGS: Cardiac shadow is within normal limits. The lungs are clear  bilaterally. A right-sided chest wall port is seen. No acute bony abnormality is noted.  IMPRESSION: No acute abnormality seen.   Electronically Signed   By: Alcide Clever M.D.   On: 07/23/2013 17:49   Results for orders placed during the hospital encounter of 06/20/11  SURGICAL PCR SCREEN     Status: None   Collection Time    06/20/11  1:10 PM      Result Value Range Status   MRSA, PCR NEGATIVE  NEGATIVE Final   Staphylococcus aureus NEGATIVE  NEGATIVE Final   Comment:            The Xpert SA Assay (FDA     approved for NASAL specimens     only), is one component of     a comprehensive surveillance     program.  It is not intended     to diagnose infection nor to     guide or monitor treatment.    Assessment: Neutropenic fever on background history of lymphoma  Plan:  #1 follicular lymphoma We will continue supportive care. She received cycle 1 of chemotherapy recently. #2 neutropenic fever The patient is improving while on broad-spectrum intravenous antibiotics. Blood culture and urine culture are still pending but so far there were no growth. Chest x-ray was reviewed which showed no evidence of pneumonia. I recommend we continue with the same antibiotic therapy for at least 48-72 hours. I  will continue Neupogen until ANC greater than 1500 on 2 occasions. #3 thrombocytopenia This is due to recent chemotherapy. She had no signs and symptoms of bleeding. We will continue DVT prophylaxis with Lovenox as long as her platelet count stay above 50,000 #4 reversible kidney injury Yesterday, her creatinine was elevated secondary to dehydration. It is improving with intravenous fluids. I will continue the same.  #5 DVT prophylaxis As mentioned above, as long as her platelet count remains over 50,000, we will continue on Lovenox. #6 anemia This is likely due to recent treatment. The patient denies recent history of bleeding such as epistaxis, hematuria or hematochezia. She is asymptomatic from  the anemia. I will observe for now.  She does not require transfusion now. Tahliyah Anagnos, MD 07/24/2013  8:10 AM

## 2013-07-24 NOTE — Plan of Care (Signed)
Problem: Phase I Progression Outcomes Goal: Hemodynamically stable Outcome: Not Met (add Reason) Temp >102 prn tylenol given

## 2013-07-24 NOTE — Care Management Note (Signed)
   CARE MANAGEMENT NOTE 07/24/2013  Patient:  [name], [name]   Account Number:  [id number]  Date Initiated:  07/24/2013  Documentation initiated by:  Asa Fath  Subjective/Objective Assessment:   68 yo female admitted with neutropenic fever.     Action/Plan:   Home when stable   Anticipated DC Date:     Anticipated DC Plan:  HOME/SELF CARE      DC Planning Services  CM consult      Choice offered to / List presented to:  NA   DME arranged  NA      DME agency  NA     HH arranged  NA      HH agency  NA   Status of service:  In process, will continue to follow Medicare Important Message given?   (If response is "NO", the following Medicare IM given date fields will be blank) Date Medicare IM given:   Date Additional Medicare IM given:    Discharge Disposition:    Per UR Regulation:  Reviewed for med. necessity/level of care/duration of stay  If discussed at Long Length of Stay Meetings, dates discussed:    Comments:  07/24/13 1049 Deboraha Goar,RN,MSN [phone] Chart reviewed for utilization of services. No needs identified at this time.

## 2013-07-25 DIAGNOSIS — R509 Fever, unspecified: Secondary | ICD-10-CM

## 2013-07-25 DIAGNOSIS — D6481 Anemia due to antineoplastic chemotherapy: Secondary | ICD-10-CM

## 2013-07-25 DIAGNOSIS — G4700 Insomnia, unspecified: Secondary | ICD-10-CM

## 2013-07-25 DIAGNOSIS — E860 Dehydration: Secondary | ICD-10-CM | POA: Diagnosis not present

## 2013-07-25 DIAGNOSIS — E876 Hypokalemia: Secondary | ICD-10-CM

## 2013-07-25 DIAGNOSIS — D709 Neutropenia, unspecified: Secondary | ICD-10-CM | POA: Diagnosis not present

## 2013-07-25 DIAGNOSIS — D6959 Other secondary thrombocytopenia: Secondary | ICD-10-CM

## 2013-07-25 HISTORY — DX: Insomnia, unspecified: G47.00

## 2013-07-25 LAB — BASIC METABOLIC PANEL
CO2: 24 mEq/L (ref 19–32)
Calcium: 8.8 mg/dL (ref 8.4–10.5)
Chloride: 104 mEq/L (ref 96–112)
Glucose, Bld: 90 mg/dL (ref 70–99)
Potassium: 3.4 mEq/L — ABNORMAL LOW (ref 3.5–5.1)
Sodium: 135 mEq/L (ref 135–145)

## 2013-07-25 LAB — CBC WITH DIFFERENTIAL/PLATELET
Basophils Relative: 1 % (ref 0–1)
Eosinophils Relative: 1 % (ref 0–5)
HCT: 29.8 % — ABNORMAL LOW (ref 36.0–46.0)
Hemoglobin: 10.4 g/dL — ABNORMAL LOW (ref 12.0–15.0)
Lymphocytes Relative: 21 % (ref 12–46)
MCH: 28.6 pg (ref 26.0–34.0)
Monocytes Relative: 15 % — ABNORMAL HIGH (ref 3–12)
Neutro Abs: 2.8 10*3/uL (ref 1.7–7.7)
Neutrophils Relative %: 62 % (ref 43–77)
Platelets: 88 10*3/uL — ABNORMAL LOW (ref 150–400)
RBC: 3.64 MIL/uL — ABNORMAL LOW (ref 3.87–5.11)
WBC: 4.3 10*3/uL (ref 4.0–10.5)

## 2013-07-25 MED ORDER — TEMAZEPAM 7.5 MG PO CAPS: 7.5000 mg | ORAL_CAPSULE | Freq: Every evening | ORAL | Status: DC | PRN

## 2013-07-25 MED ORDER — POTASSIUM CHLORIDE CRYS ER 20 MEQ PO TBCR
20.0000 meq | EXTENDED_RELEASE_TABLET | Freq: Two times a day (BID) | ORAL | Status: DC
  Administered 2013-07-25 – 2013-07-27 (×4): 20 meq via ORAL
  Filled 2013-07-25 (×5): qty 1

## 2013-07-25 NOTE — Progress Notes (Signed)
[name]   DOB:1946-06-30   ZO#:[id number]    Subjective: The patient had a bad night with nightmares after taking zolpidem. She does not feel well today. She also complained of weakness. Denies any fevers or chills. No nausea.  Objective:  Filed Vitals:   07/25/13 1458  BP: 137/65  Pulse: 82  Temp: 98 F (36.7 C)  Resp: 20     Intake/Output Summary (Last 24 hours) at 07/25/13 1614 Last data filed at 07/25/13 1108  Gross per 24 hour  Intake    240 ml  Output   1125 ml  Net   -885 ml    GENERAL:alert, no distress and comfortable SKIN: skin color, texture, turgor are normal, no rashes or significant lesions EYES: normal, Conjunctiva are pink and non-injected, sclera clear OROPHARYNX:no exudate, no erythema and lips, buccal mucosa, and tongue normal  NECK: supple, thyroid normal size, non-tender, without nodularity LYMPH:  no palpable lymphadenopathy in the cervical, axillary or inguinal LUNGS: clear to auscultation and percussion with normal breathing effort HEART: regular rate & rhythm and no murmurs and no lower extremity edema ABDOMEN:abdomen soft, non-tender and normal bowel sounds Musculoskeletal:no cyanosis of digits and no clubbing  NEURO: alert & oriented x 3 with fluent speech, no focal motor/sensory deficits   Labs:  Lab Results  Component Value Date   WBC 4.3 07/25/2013   HGB 10.4* 07/25/2013   HCT 29.8* 07/25/2013   MCV 81.9 07/25/2013   PLT 88* 07/25/2013   NEUTROABS 2.8 07/25/2013   Preliminary blood culture dated 07/23/2013 show gram-negative rods @LASTCHEMISTRY @  Studies:  X-ray Chest Pa And Lateral   07/23/2013   CLINICAL DATA:  Fever  EXAM: CHEST  2 VIEW  COMPARISON:  09/02/2012, 07/09/2013  FINDINGS: Cardiac shadow is within normal limits. The lungs are clear bilaterally. A right-sided chest wall port is seen. No acute bony abnormality is noted.  IMPRESSION: No acute abnormality seen.   Electronically Signed   By: Alcide Clever M.D.   On:  07/23/2013 17:49    Assessment: Neutropenic fever  Plan:  #1 follicular lymphoma We will continue supportive care. She received cycle 1 of chemotherapy recently. #2 neutropenic fever The patient is improving while on broad-spectrum intravenous antibiotics. Blood culture is still pending but so far preliminary testing showed it was positive for gram-negative rods. I am ordering blood cultures to be repeated today. I will discontinue vancomycin. I will discontinue Neupogen. Continue on cefepime #3 thrombocytopenia This is due to recent chemotherapy. She had no signs and symptoms of bleeding. We will continue DVT prophylaxis with Lovenox as long as her platelet count stay above 50,000 #4 reversible kidney injury Yesterday, her creatinine was elevated secondary to dehydration. It is improving with intravenous fluids. I will continue the same.  #5 DVT prophylaxis As mentioned above, as long as her platelet count remains over 50,000, we will continue on Lovenox. #6 anemia This is likely due to recent treatment. The patient denies recent history of bleeding such as epistaxis, hematuria or hematochezia. She is asymptomatic from the anemia. I will observe for now.  She does not require transfusion now. #7 hypokalemia This could have contributed to her weakness. I am replacing with potassium supplements #8 insomnia I would discontinue zolpidem and replace with temazepam as needed. #9 disposition Due to recent positive blood culture, I am repeating blood culture obtained today. I intend to keep her for another 48 hours and to repeat blood cultures came back negative.  [facility], Joyce Stanback, MD 07/25/2013  4:14 PM

## 2013-07-26 DIAGNOSIS — C8298 Follicular lymphoma, unspecified, lymph nodes of multiple sites: Secondary | ICD-10-CM | POA: Diagnosis not present

## 2013-07-26 LAB — BASIC METABOLIC PANEL
BUN: 14 mg/dL (ref 6–23)
CO2: 23 mEq/L (ref 19–32)
Chloride: 108 mEq/L (ref 96–112)
Creatinine, Ser: 0.94 mg/dL (ref 0.50–1.10)
GFR calc Af Amer: 71 mL/min — ABNORMAL LOW (ref >90)
GFR calc non Af Amer: 61 mL/min — ABNORMAL LOW (ref >90)
Sodium: 139 mEq/L (ref 135–145)

## 2013-07-26 LAB — CBC WITH DIFFERENTIAL/PLATELET
Band Neutrophils: 5 % (ref 0–10)
Basophils Absolute: 0 10*3/uL (ref 0.0–0.1)
Basophils Relative: 0 % (ref 0–1)
Blasts: 0 %
Eosinophils Absolute: 0 10*3/uL (ref 0.0–0.7)
Eosinophils Relative: 0 % (ref 0–5)
HCT: 31.1 % — ABNORMAL LOW (ref 36.0–46.0)
Hemoglobin: 10.8 g/dL — ABNORMAL LOW (ref 12.0–15.0)
Lymphocytes Relative: 15 % (ref 12–46)
Lymphs Abs: 1.7 10*3/uL (ref 0.7–4.0)
MCH: 28.6 pg (ref 26.0–34.0)
MCHC: 34.7 g/dL (ref 30.0–36.0)
Metamyelocytes Relative: 2 %
Myelocytes: 1 %
Neutro Abs: 8.7 10*3/uL — ABNORMAL HIGH (ref 1.7–7.7)
Neutrophils Relative %: 68 % (ref 43–77)
Platelets: 116 10*3/uL — ABNORMAL LOW (ref 150–400)
Promyelocytes Absolute: 0 %
RBC: 3.78 MIL/uL — ABNORMAL LOW (ref 3.87–5.11)
RDW: 13.9 % (ref 11.5–15.5)
nRBC: 0 /100 WBC

## 2013-07-26 LAB — MAGNESIUM: Magnesium: 2.1 mg/dL (ref 1.5–2.5)

## 2013-07-26 MED ORDER — FAMCICLOVIR 500 MG PO TABS
500.0000 mg | ORAL_TABLET | Freq: Two times a day (BID) | ORAL | Status: DC
  Administered 2013-07-26 – 2013-07-27 (×2): 500 mg via ORAL
  Filled 2013-07-26 (×3): qty 1

## 2013-07-26 MED ORDER — LEVOFLOXACIN 750 MG PO TABS
750.0000 mg | ORAL_TABLET | Freq: Every day | ORAL | Status: DC
  Administered 2013-07-26 – 2013-07-27 (×2): 750 mg via ORAL
  Filled 2013-07-26 (×2): qty 1

## 2013-07-26 NOTE — Progress Notes (Addendum)
[name] looks and feels well this morning. Her blood counts her come quite nicely. Her white cell count is 11.4.  All cultures have been negative. I think we can stop the antibiotics and watching her. If all is good today, that she should be oh to go home tomorrow.  She's having no diarrhea. Is no cough. She has no sore throat. Her appetite is good. She's had no nausea vomiting. She's had no sweats. There's been no rashes. She's had no dysuria.  On her physical exam, vital signs are stable. Blood pressure 131/67. Temperature 98.7. Pulse is 84. In exam shows no mucositis. There is no adenopathy in the neck. Lungs are clear bilaterally. Cardiac exam regular and rhythm with no murmurs rubs or bruits. Abdomen is soft. She has good bowel sounds. There is a palpable abdominal mass. There is no palpable liver or spleen tip. Extremities shows no clubbing cyanosis or edema. Skin exam shows no rashes. Neurological exam is nonfocal. Her Port-A-Cath site is without warmth or erythema or swelling.  Laboratory studies show white count 11.4. Hemoglobin 10.8. Platelet count 116. BUN 14 creatinine 0.94.  We will go ahead and stop the Maxipime. Again, she is afebrile today, then she should be able to be discharged home on Sunday.  We had excellent prayer session. She has an extremely strong faith. We shared Scripture.  As always, I appreciate the great care that she is getting from staff on the floor!!  Pete e.

## 2013-07-27 ENCOUNTER — Other Ambulatory Visit: Payer: Self-pay | Admitting: Hematology and Oncology

## 2013-07-27 DIAGNOSIS — D709 Neutropenia, unspecified: Secondary | ICD-10-CM | POA: Diagnosis not present

## 2013-07-27 DIAGNOSIS — D6481 Anemia due to antineoplastic chemotherapy: Secondary | ICD-10-CM | POA: Diagnosis not present

## 2013-07-27 DIAGNOSIS — R509 Fever, unspecified: Secondary | ICD-10-CM | POA: Diagnosis not present

## 2013-07-27 DIAGNOSIS — D6959 Other secondary thrombocytopenia: Secondary | ICD-10-CM | POA: Diagnosis not present

## 2013-07-27 LAB — CBC WITH DIFFERENTIAL/PLATELET
Basophils Absolute: 0.1 10*3/uL (ref 0.0–0.1)
Eosinophils Absolute: 0 10*3/uL (ref 0.0–0.7)
Lymphs Abs: 1 10*3/uL (ref 0.7–4.0)
MCH: 29 pg (ref 26.0–34.0)
MCHC: 35.3 g/dL (ref 30.0–36.0)
Monocytes Absolute: 1 10*3/uL (ref 0.1–1.0)
Neutrophils Relative %: 83 % — ABNORMAL HIGH (ref 43–77)
Platelets: 121 10*3/uL — ABNORMAL LOW (ref 150–400)
RDW: 14.1 % (ref 11.5–15.5)

## 2013-07-27 LAB — BASIC METABOLIC PANEL
Calcium: 8.6 mg/dL (ref 8.4–10.5)
Creatinine, Ser: 0.84 mg/dL (ref 0.50–1.10)
GFR calc Af Amer: 82 mL/min — ABNORMAL LOW (ref >90)
GFR calc non Af Amer: 70 mL/min — ABNORMAL LOW (ref >90)
Glucose, Bld: 101 mg/dL — ABNORMAL HIGH (ref 70–99)
Sodium: 140 mEq/L (ref 135–145)

## 2013-07-27 MED ORDER — AMOXICILLIN-POT CLAVULANATE 875-125 MG PO TABS: 1.0000 | ORAL_TABLET | Freq: Two times a day (BID) | ORAL | Status: DC

## 2013-07-27 MED ORDER — HEPARIN SOD (PORK) LOCK FLUSH 100 UNIT/ML IV SOLN
500.0000 [IU] | Freq: Once | INTRAVENOUS | Status: DC
  Filled 2013-07-27: qty 5

## 2013-07-27 NOTE — Progress Notes (Signed)
Patient discharged to home with family, discharge instructions reviewed with patient who verbalized understanding, new RX given to patient. Port a cath de accessed and flushed with 10 ml NS and 500  Heparin.

## 2013-07-27 NOTE — Discharge Summary (Signed)
Physician Discharge Summary  Patient ID: [name] MRN: [id number] DOB/AGE: [birth year] 67 y.o.  Admit date: 07/23/2013 Discharge date: 07/27/2013  Primary Care Physician:  [name], MD   Discharge Diagnoses:  Neutropenic fever, resolved  Discharge Medications:    Medication List    STOP taking these medications       predniSONE 50 MG tablet  Commonly known as:  DELTASONE      TAKE these medications       allopurinol 300 MG tablet  Commonly known as:  ZYLOPRIM  Take 1 tablet (300 mg total) by mouth daily.     ALPRAZolam 0.25 MG tablet  Commonly known as:  XANAX  Take 0.25 mg by mouth at bedtime as needed for sleep.     amoxicillin-clavulanate 875-125 MG per tablet  Commonly known as:  AUGMENTIN  Take 1 tablet by mouth 2 (two) times daily.     aspirin 81 MG tablet  Take 81 mg by mouth at bedtime.     CAL-MAG-ZINC PO  Take 1 tablet by mouth daily.     carboxymethylcellulose 0.5 % Soln  Commonly known as:  REFRESH PLUS  Place 1 drop into both eyes daily as needed (DRY EYES).     clobetasol cream 0.05 %  Commonly known as:  TEMOVATE  Apply 1 application topically daily. Apply to affected area     Fish Oil 1200 MG Caps  Take 2 capsules by mouth daily.     lansoprazole 30 MG capsule  Commonly known as:  PREVACID  Take 30 mg by mouth daily.     lidocaine-prilocaine cream  Commonly known as:  EMLA  Apply topically as needed.     losartan-hydrochlorothiazide 100-12.5 MG per tablet  Commonly known as:  HYZAAR  Take 1 tablet by mouth every evening.     magic mouthwash w/lidocaine Soln  Take 5 mLs by mouth 4 (four) times daily.     ondansetron 8 MG tablet  Commonly known as:  ZOFRAN  Take 8 mg by mouth every 8 (eight) hours as needed for nausea or vomiting.     potassium chloride SA 20 MEQ tablet  Commonly known as:  K-DUR,KLOR-CON  Take 20 mEq by mouth daily.     PROBIOTIC DAILY PO  Take 1 capsule by mouth daily.     prochlorperazine 10 MG tablet  Commonly known as:  COMPAZINE  Take 1 tablet (10 mg total) by mouth every 6 (six) hours as needed (Nausea or vomiting).     pyridOXINE 100 MG tablet  Commonly known as:  VITAMIN B-6  Take 100 mg by mouth daily.     Red Yeast Rice 600 MG Caps  Take 1 capsule by mouth every evening.     vitamin C 500 MG tablet  Commonly known as:  ASCORBIC ACID  Take 500 mg by mouth daily.     Vitamin D 2000 UNITS tablet  Take 2,000 Units by mouth daily.         Disposition and Follow-up:   Significant Diagnostic Studies:  X-ray Chest Pa And Lateral   07/23/2013   CLINICAL DATA:  Fever  EXAM: CHEST  2 VIEW  COMPARISON:  09/02/2012, 07/09/2013  FINDINGS: Cardiac shadow is within normal limits. The lungs are clear bilaterally. A right-sided chest wall port is seen. No acute bony abnormality is noted.  IMPRESSION: No acute abnormality seen.   Electronically Signed   By: Alcide Clever M.D.   On: 07/23/2013 17:49  Discharge Laboratory Values: Lab Results  Component Value Date   WBC 12.7* 07/27/2013   HGB 10.2* 07/27/2013   HCT 28.9* 07/27/2013   MCV 82.1 07/27/2013   PLT 121* 07/27/2013   Hospital Course:   For complete details please refer to admission H and P, but in brief, [name] was admitted to the hospital after presentation with neutropenic fever. Her blood culture drawn on the day of admission came back positive for gram-negative rods. She responded well to Neupogen and intravenous antibiotic therapy. Her repeat blood culture dated 07/25/2013 was negative. The patient also have mild anemia and thrombocytopenia which are stable at discharge. She also have evidence of dehydration, with hyponatremia and elevated creatinine on admission which resolved by the time she was discharged.  Physical Exam at Discharge: BP 146/74  Pulse 72  Temp(Src) 99 F (37.2 C) (Oral)  Resp 16  Ht 5\' 2"  (1.575 m)  Wt 179 lb (81.194 kg)  BMI 32.73 kg/m2  SpO2 99% Gen:  Patient looks well, in no acute distress Cardiovascular: Regular rate and rhythm no murmurs Respiratory: Clear on auscultation, no crackles Gastrointestinal: Abdomen is soft, nontender. Extremities: No evidence of lower extremity edema.  Diet:  Regular  Activity:  Normal, as tolerated  Condition at Discharge:   Stable  Signed: Dr. Artis Delay [phone]  07/27/2013, 11:56 AM

## 2013-07-31 LAB — CULTURE, BLOOD (ROUTINE X 2): Culture: NO GROWTH

## 2013-08-01 ENCOUNTER — Other Ambulatory Visit: Payer: Self-pay | Admitting: Hematology and Oncology

## 2013-08-01 ENCOUNTER — Telehealth: Payer: Self-pay | Admitting: *Deleted

## 2013-08-01 ENCOUNTER — Encounter: Payer: Self-pay | Admitting: Hematology and Oncology

## 2013-08-01 DIAGNOSIS — B49 Unspecified mycosis: Secondary | ICD-10-CM | POA: Insufficient documentation

## 2013-08-01 HISTORY — DX: Unspecified mycosis: B49

## 2013-08-01 MED ORDER — NYSTATIN-TRIAMCINOLONE 100000-0.1 UNIT/GM-% EX OINT: 1.0000 "application " | TOPICAL_OINTMENT | Freq: Two times a day (BID) | CUTANEOUS | Status: DC

## 2013-08-01 NOTE — Telephone Encounter (Signed)
Informed pt of new prescription and instructions per Dr. Bertis Ruddy. She verbalized understanding.

## 2013-08-01 NOTE — Telephone Encounter (Signed)
I have ordered topical fungal cream and sent it to her pharmacy Instruction for BID topical to affected areas until I see her

## 2013-08-01 NOTE — Telephone Encounter (Signed)
Pt reports "burning" type rash which looks and feels like a burn to top inner thighs.  States has been using Desitin cream (baby rash cream) but it has not helped.

## 2013-08-04 ENCOUNTER — Encounter: Payer: Self-pay | Admitting: Hematology and Oncology

## 2013-08-04 ENCOUNTER — Encounter: Payer: Self-pay | Admitting: *Deleted

## 2013-08-04 NOTE — Progress Notes (Signed)
Put fmla form on nurse's desk °

## 2013-08-04 NOTE — Progress Notes (Signed)
FMLA paperwork received on fax machine for family member Yarnell Kozloski.  Forwarded to [employer] in managed care dept.. For completion.

## 2013-08-05 ENCOUNTER — Encounter: Payer: Self-pay | Admitting: Hematology and Oncology

## 2013-08-05 ENCOUNTER — Telehealth: Payer: Self-pay | Admitting: Hematology and Oncology

## 2013-08-05 ENCOUNTER — Other Ambulatory Visit: Payer: Self-pay | Admitting: Hematology and Oncology

## 2013-08-05 ENCOUNTER — Ambulatory Visit (HOSPITAL_BASED_OUTPATIENT_CLINIC_OR_DEPARTMENT_OTHER): Payer: Medicare Other | Admitting: Hematology and Oncology

## 2013-08-05 ENCOUNTER — Encounter: Payer: Self-pay | Admitting: *Deleted

## 2013-08-05 ENCOUNTER — Other Ambulatory Visit (HOSPITAL_BASED_OUTPATIENT_CLINIC_OR_DEPARTMENT_OTHER): Payer: Medicare Other | Admitting: Lab

## 2013-08-05 VITALS — BP 135/76 | HR 74 | Temp 98.2°F | Resp 18 | Ht 62.0 in | Wt 178.6 lb

## 2013-08-05 DIAGNOSIS — K649 Unspecified hemorrhoids: Secondary | ICD-10-CM | POA: Diagnosis not present

## 2013-08-05 DIAGNOSIS — C8299 Follicular lymphoma, unspecified, extranodal and solid organ sites: Secondary | ICD-10-CM | POA: Diagnosis not present

## 2013-08-05 DIAGNOSIS — K5900 Constipation, unspecified: Secondary | ICD-10-CM | POA: Diagnosis not present

## 2013-08-05 DIAGNOSIS — C8239 Follicular lymphoma grade IIIa, extranodal and solid organ sites: Secondary | ICD-10-CM

## 2013-08-05 HISTORY — DX: Unspecified hemorrhoids: K64.9

## 2013-08-05 LAB — COMPREHENSIVE METABOLIC PANEL (CC13)
AST: 18 U/L (ref 5–34)
Albumin: 3.8 g/dL (ref 3.5–5.0)
Alkaline Phosphatase: 66 U/L (ref 40–150)
Anion Gap: 11 mEq/L (ref 3–11)
BUN: 11.2 mg/dL (ref 7.0–26.0)
CO2: 26 mEq/L (ref 22–29)
Chloride: 106 mEq/L (ref 98–109)
Creatinine: 0.9 mg/dL (ref 0.6–1.1)
Glucose: 82 mg/dl (ref 70–140)
Potassium: 3.7 mEq/L (ref 3.5–5.1)
Sodium: 143 mEq/L (ref 136–145)
Total Protein: 6.8 g/dL (ref 6.4–8.3)

## 2013-08-05 LAB — CBC WITH DIFFERENTIAL/PLATELET
EOS%: 0.2 % (ref 0.0–7.0)
Eosinophils Absolute: 0 10*3/uL (ref 0.0–0.5)
MCV: 83.8 fL (ref 79.5–101.0)
MONO#: 0.5 10*3/uL (ref 0.1–0.9)
MONO%: 12.5 % (ref 0.0–14.0)
NEUT#: 2.7 10*3/uL (ref 1.5–6.5)
NEUT%: 64.5 % (ref 38.4–76.8)
RBC: 4.11 10*6/uL (ref 3.70–5.45)
RDW: 14.5 % (ref 11.2–14.5)
WBC: 4.2 10*3/uL (ref 3.9–10.3)
lymph#: 0.7 10*3/uL — ABNORMAL LOW (ref 0.9–3.3)

## 2013-08-05 MED ORDER — DOCUSATE SODIUM 100 MG PO CAPS: 100.0000 mg | ORAL_CAPSULE | Freq: Two times a day (BID) | ORAL | Status: DC

## 2013-08-05 NOTE — Progress Notes (Signed)
FMLA paperwork for pt's family member, Sabiha Sura, signed by Dr. Bertis Ruddy and returned to Axel Filler in managed care dept.

## 2013-08-05 NOTE — Telephone Encounter (Signed)
worked 11/25 POF appts made AVS and CAl given to pt shh

## 2013-08-05 NOTE — Telephone Encounter (Signed)
worked 11/25 POF appts made SD to add tx since MW out today AVS and CAL given to pt shh

## 2013-08-05 NOTE — Progress Notes (Signed)
[facility] OFFICE PROGRESS NOTE  Patient Care Team: Mirna Mires, MD as PCP - General (Family Medicine) Artis Delay, MD as Consulting Physician (Hematology and Oncology)  DIAGNOSIS: High-grade follicular lymphoma stage IV A., seen prior to cycle 2 of chemotherapy  SUMMARY OF ONCOLOGIC HISTORY: Oncology History   Follicular lymphoma grade IIIa of extranodal and solid organ sites Follicular lymphoma (Resolved on 07/02/2013)   Primary site: Lymphoid Neoplasms (Bilateral)   Staging method: AJCC 6th Edition   Clinical free text: Stage IVA, Follicular lymphoma high grade   Clinical: Stage IV signed by Artis Delay, MD on 07/02/2013  8:09 AM   Pathologic: Stage IV signed by Artis Delay, MD on 07/02/2013  8:10 AM   Summary: Stage IV         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 Needle biopsy was inconclusive.     08/02/2011 Imaging CT scan of the chest abdomen and pelvis showed numerous lymphadenopathy above and below the diaphragm.    08/16/2011 Imaging MRI of the abdomen revealed enlarging left kidney lesion worrisome for malignancy   09/20/2011 Initial Diagnosis Lymphoma, low grade from kidney biopsy, follicular   03/28/2013 Procedure Histologic type: Non-Hodgkin's lymphoma, follicular center cell type. Grade (if applicable): Favor high grade, Ki-67 ranges from 10-50%. Immunohistochemical stains: CD20, CD79a, CD10, BCL-2, BCL-6, CD21, CD3, CD43.   05/09/2013 Procedure BM biopsy is highly suspicious of involvement. Patient had persistent leukopenia   05/09/2013 Imaging PET/CT showed significant involvement of LN everywhere   07/16/2013 -  Chemotherapy Start cycle 1 of RCHOP   07/23/2013 - 07/27/2013 Hospital Admission Admitted for management of mucositis and neutropenic fever. Blood culture was positive for gram negative rods, resolved with antibiotics    Follicular lymphoma (Resolved)   INTERVAL HISTORY: [name] 67 y.o. female returns for evaluation prior to cycle 2 treatment. She has very mild mucositis, resolving. Denies any nausea or vomiting. She had some mild constipation causing exacerbation of her hemorrhoids. She has been having some intermittent bleeding through the hemorrhoids with each bowel movement. The patient denies any recent signs or symptoms of bleeding such as spontaneous epistaxis, hematuria or hematochezia. She denies any recent fever, chills, night sweats or abnormal weight loss  I have reviewed the past medical history, past surgical history, social history and family history with the patient and they are unchanged from previous note.  ALLERGIES:  is allergic to furosemide and latex.  MEDICATIONS:  Current Outpatient Prescriptions  Medication Sig Dispense Refill  . allopurinol (ZYLOPRIM) 300 MG tablet Take 1 tablet (300 mg total) by mouth daily.  30 tablet  0  . Alum & Mag Hydroxide-Simeth (MAGIC MOUTHWASH W/LIDOCAINE) SOLN Take 5 mLs by mouth 4 (four) times daily.  240 mL  2  . aspirin 81 MG tablet Take 81 mg by mouth at bedtime.       . Calcium-Magnesium-Zinc (CAL-MAG-ZINC PO) Take 1 tablet by mouth daily.       . carboxymethylcellulose (REFRESH PLUS) 0.5 % SOLN Place 1 drop into both eyes daily as needed (DRY EYES).      . Cholecalciferol (VITAMIN D) 2000 UNITS tablet Take 2,000 Units by mouth daily.      . clobetasol cream (TEMOVATE) 0.05 % Apply 1 application topically daily. Apply to affected area      . lansoprazole (PREVACID) 30 MG capsule Take 30 mg by mouth daily.       Marland Kitchen  lidocaine-prilocaine (EMLA) cream Apply topically as needed.  30 g  0  . losartan-hydrochlorothiazide (HYZAAR) 100-12.5 MG per tablet Take 1 tablet by mouth every evening.       . nystatin-triamcinolone ointment (MYCOLOG) Apply 1 application topically 2 (two) times daily.  30 g  0  . Omega-3 Fatty Acids (FISH OIL) 1200 MG CAPS  Take 2 capsules by mouth daily.       . ondansetron (ZOFRAN) 8 MG tablet Take 8 mg by mouth every 8 (eight) hours as needed for nausea or vomiting.      . potassium chloride SA (K-DUR,KLOR-CON) 20 MEQ tablet Take 20 mEq by mouth daily.       . Probiotic Product (PROBIOTIC DAILY PO) Take 1 capsule by mouth daily.      . prochlorperazine (COMPAZINE) 10 MG tablet Take 1 tablet (10 mg total) by mouth every 6 (six) hours as needed (Nausea or vomiting).  30 tablet  6  . pyridOXINE (VITAMIN B-6) 100 MG tablet Take 100 mg by mouth daily.       . Red Yeast Rice 600 MG CAPS Take 1 capsule by mouth every evening.       . vitamin C (ASCORBIC ACID) 500 MG tablet Take 500 mg by mouth daily.      Marland Kitchen docusate sodium (COLACE) 100 MG capsule Take 1 capsule (100 mg total) by mouth 2 (two) times daily.  100 capsule  1   No current facility-administered medications for this visit.    REVIEW OF SYSTEMS:   Constitutional: Denies fevers, chills or abnormal weight loss Eyes: Denies blurriness of vision Respiratory: Denies cough, dyspnea or wheezes Cardiovascular: Denies palpitation, chest discomfort or lower extremity swelling Gastrointestinal:  Denies nausea, heartburn or change in bowel habits Skin: Denies abnormal skin rashes Lymphatics: Denies new lymphadenopathy or easy bruising Neurological:Denies numbness, tingling or new weaknesses Behavioral/Psych: Mood is stable, no new changes  All other systems were reviewed with the patient and are negative.  PHYSICAL EXAMINATION: ECOG PERFORMANCE STATUS: 1 - Symptomatic but completely ambulatory  Filed Vitals:   08/05/13 1158  BP: 135/76  Pulse: 74  Temp: 98.2 F (36.8 C)  Resp: 18   Filed Weights   08/05/13 1158  Weight: 178 lb 9.6 oz (81.012 kg)    GENERAL:alert, no distress and comfortable SKIN: skin color, texture, turgor are normal, no rashes or significant lesions EYES: normal, Conjunctiva are pink and non-injected, sclera clear OROPHARYNX:no  exudate, no erythema and lips, buccal mucosa, and tongue normal  NECK: supple, thyroid normal size, non-tender, without nodularity LYMPH:  no palpable lymphadenopathy in the cervical, axillary or inguinal. Previously palpable lymphadenopathy has completely resolved LUNGS: clear to auscultation and percussion with normal breathing effort HEART: regular rate & rhythm and no murmurs and no lower extremity edema ABDOMEN:abdomen soft, non-tender and normal bowel sounds Musculoskeletal:no cyanosis of digits and no clubbing  NEURO: alert & oriented x 3 with fluent speech, no focal motor/sensory deficits  LABORATORY DATA:  I have reviewed the data as listed    Component Value Date/Time   NA 143 08/05/2013 1119   NA 140 07/27/2013 0550   K 3.7 08/05/2013 1119   K 3.6 07/27/2013 0550   CL 107 07/27/2013 0550   CO2 26 08/05/2013 1119   CO2 25 07/27/2013 0550   GLUCOSE 82 08/05/2013 1119   GLUCOSE 101* 07/27/2013 0550   BUN 11.2 08/05/2013 1119   BUN 12 07/27/2013 0550   CREATININE 0.9 08/05/2013 1119   CREATININE 0.84  07/27/2013 0550   CALCIUM 9.5 08/05/2013 1119   CALCIUM 8.6 07/27/2013 0550   PROT 6.8 08/05/2013 1119   PROT 6.8 07/24/2013 0550   ALBUMIN 3.8 08/05/2013 1119   ALBUMIN 3.4* 07/24/2013 0550   AST 18 08/05/2013 1119   AST 19 07/24/2013 0550   ALT 18 08/05/2013 1119   ALT 30 07/24/2013 0550   ALKPHOS 66 08/05/2013 1119   ALKPHOS 70 07/24/2013 0550   BILITOT 0.48 08/05/2013 1119   BILITOT 1.1 07/24/2013 0550   GFRNONAA 70* 07/27/2013 0550   GFRAA 82* 07/27/2013 0550    No results found for this basename: SPEP, UPEP,  kappa and lambda light chains    Lab Results  Component Value Date   WBC 4.2 08/05/2013   NEUTROABS 2.7 08/05/2013   HGB 11.7 08/05/2013   HCT 34.4* 08/05/2013   MCV 83.8 08/05/2013   PLT 287 08/05/2013      Chemistry      Component Value Date/Time   NA 143 08/05/2013 1119   NA 140 07/27/2013 0550   K 3.7 08/05/2013 1119   K 3.6 07/27/2013  0550   CL 107 07/27/2013 0550   CO2 26 08/05/2013 1119   CO2 25 07/27/2013 0550   BUN 11.2 08/05/2013 1119   BUN 12 07/27/2013 0550   CREATININE 0.9 08/05/2013 1119   CREATININE 0.84 07/27/2013 0550      Component Value Date/Time   CALCIUM 9.5 08/05/2013 1119   CALCIUM 8.6 07/27/2013 0550   ALKPHOS 66 08/05/2013 1119   ALKPHOS 70 07/24/2013 0550   AST 18 08/05/2013 1119   AST 19 07/24/2013 0550   ALT 18 08/05/2013 1119   ALT 30 07/24/2013 0550   BILITOT 0.48 08/05/2013 1119   BILITOT 1.1 07/24/2013 0550     ASSESSMENT & PLAN:  #1 follicular lymphoma She developed complication with neutropenic fever, despite Neulasta support with cycle 1 of treatment. I will reduce her chemotherapy dosage by 20% to reduce the risk of recurrent infection. She agreed. We will continue Neulasta support with each cycle. #2 recent mucositis This is very mild and is improving. She would continue conservative management. #3 constipation with hemorrhoids I have prescribed stool softener for her. I also recommend she stop taking her calcium supplements. If it remains an issue in the future, we might have to reduce the vincristine dose a little bit. Orders Placed This Encounter  Procedures  . CBC with Differential    Standing Status: Future     Number of Occurrences:      Standing Expiration Date: 04/27/2014  . Comprehensive metabolic panel    Standing Status: Future     Number of Occurrences:      Standing Expiration Date: 08/05/2014  . Lactate dehydrogenase    Standing Status: Future     Number of Occurrences:      Standing Expiration Date: 08/05/2014   All questions were answered. The patient knows to call the clinic with any problems, questions or concerns. No barriers to learning was detected.    [facility], Eveleigh Crumpler, MD 08/05/2013 12:35 PM

## 2013-08-05 NOTE — Progress Notes (Signed)
Faxed [name]'s fmla form to [employer] @ [phone].

## 2013-08-06 ENCOUNTER — Ambulatory Visit (HOSPITAL_BASED_OUTPATIENT_CLINIC_OR_DEPARTMENT_OTHER): Payer: Medicare Other

## 2013-08-06 VITALS — BP 112/69 | HR 66 | Temp 97.6°F | Resp 18

## 2013-08-06 DIAGNOSIS — C8239 Follicular lymphoma grade IIIa, extranodal and solid organ sites: Secondary | ICD-10-CM

## 2013-08-06 DIAGNOSIS — Z5111 Encounter for antineoplastic chemotherapy: Secondary | ICD-10-CM

## 2013-08-06 DIAGNOSIS — C8299 Follicular lymphoma, unspecified, extranodal and solid organ sites: Secondary | ICD-10-CM

## 2013-08-06 DIAGNOSIS — Z5112 Encounter for antineoplastic immunotherapy: Secondary | ICD-10-CM | POA: Diagnosis not present

## 2013-08-06 MED ORDER — ONDANSETRON 16 MG/50ML IVPB (CHCC)
16.0000 mg | Freq: Once | INTRAVENOUS | Status: AC
  Administered 2013-08-06: 16 mg via INTRAVENOUS

## 2013-08-06 MED ORDER — DIPHENHYDRAMINE HCL 25 MG PO CAPS
50.0000 mg | ORAL_CAPSULE | Freq: Once | ORAL | Status: AC
  Administered 2013-08-06: 50 mg via ORAL

## 2013-08-06 MED ORDER — DIPHENHYDRAMINE HCL 25 MG PO CAPS
ORAL_CAPSULE | ORAL | Status: AC
  Filled 2013-08-06: qty 2

## 2013-08-06 MED ORDER — SODIUM CHLORIDE 0.9 % IV SOLN
Freq: Once | INTRAVENOUS | Status: AC
  Administered 2013-08-06: 10:00:00 via INTRAVENOUS

## 2013-08-06 MED ORDER — DEXAMETHASONE SODIUM PHOSPHATE 20 MG/5ML IJ SOLN
INTRAMUSCULAR | Status: AC
  Filled 2013-08-06: qty 5

## 2013-08-06 MED ORDER — HEPARIN SOD (PORK) LOCK FLUSH 100 UNIT/ML IV SOLN
500.0000 [IU] | Freq: Once | INTRAVENOUS | Status: AC | PRN
  Administered 2013-08-06: 500 [IU]
  Filled 2013-08-06: qty 5

## 2013-08-06 MED ORDER — ONDANSETRON 16 MG/50ML IVPB (CHCC)
INTRAVENOUS | Status: AC
  Filled 2013-08-06: qty 16

## 2013-08-06 MED ORDER — VINCRISTINE SULFATE CHEMO INJECTION 1 MG/ML
2.0000 mg | Freq: Once | INTRAVENOUS | Status: AC
  Administered 2013-08-06: 2 mg via INTRAVENOUS
  Filled 2013-08-06: qty 2

## 2013-08-06 MED ORDER — SODIUM CHLORIDE 0.9 % IV SOLN
600.0000 mg/m2 | Freq: Once | INTRAVENOUS | Status: AC
  Administered 2013-08-06: 1160 mg via INTRAVENOUS
  Filled 2013-08-06: qty 58

## 2013-08-06 MED ORDER — DEXAMETHASONE SODIUM PHOSPHATE 20 MG/5ML IJ SOLN
20.0000 mg | Freq: Once | INTRAMUSCULAR | Status: AC
  Administered 2013-08-06: 20 mg via INTRAVENOUS

## 2013-08-06 MED ORDER — DOXORUBICIN HCL CHEMO IV INJECTION 2 MG/ML
40.0000 mg/m2 | Freq: Once | INTRAVENOUS | Status: AC
  Administered 2013-08-06: 76 mg via INTRAVENOUS
  Filled 2013-08-06: qty 38

## 2013-08-06 MED ORDER — SODIUM CHLORIDE 0.9 % IV SOLN
375.0000 mg/m2 | Freq: Once | INTRAVENOUS | Status: AC
  Administered 2013-08-06: 700 mg via INTRAVENOUS
  Filled 2013-08-06: qty 70

## 2013-08-06 MED ORDER — SODIUM CHLORIDE 0.9 % IJ SOLN
10.0000 mL | INTRAMUSCULAR | Status: DC | PRN
  Administered 2013-08-06: 10 mL
  Filled 2013-08-06: qty 10

## 2013-08-06 MED ORDER — ACETAMINOPHEN 325 MG PO TABS
650.0000 mg | ORAL_TABLET | Freq: Once | ORAL | Status: AC
  Administered 2013-08-06: 650 mg via ORAL

## 2013-08-06 MED ORDER — ACETAMINOPHEN 325 MG PO TABS
ORAL_TABLET | ORAL | Status: AC
  Filled 2013-08-06: qty 2

## 2013-08-06 NOTE — Patient Instructions (Signed)
[facility] Discharge Instructions for Patients Receiving Chemotherapy  Today you received the following chemotherapy agents Adriamycin/Vincristine/Cytoxan/Rituxan.  To help prevent nausea and vomiting after your treatment, we encourage you to take your nausea medication as prescribed.   If you develop nausea and vomiting that is not controlled by your nausea medication, call the clinic.   BELOW ARE SYMPTOMS THAT SHOULD BE REPORTED IMMEDIATELY:  *FEVER GREATER THAN 100.5 F  *CHILLS WITH OR WITHOUT FEVER  NAUSEA AND VOMITING THAT IS NOT CONTROLLED WITH YOUR NAUSEA MEDICATION  *UNUSUAL SHORTNESS OF BREATH  *UNUSUAL BRUISING OR BLEEDING  TENDERNESS IN MOUTH AND THROAT WITH OR WITHOUT PRESENCE OF ULCERS  *URINARY PROBLEMS  *BOWEL PROBLEMS  UNUSUAL RASH Items with * indicate a potential emergency and should be followed up as soon as possible.  Feel free to call the clinic you have any questions or concerns. The clinic phone number is [phone].    

## 2013-08-06 NOTE — Progress Notes (Signed)
Brisk blood return noted before, during and at end of Adriamycin administration. 

## 2013-08-08 ENCOUNTER — Ambulatory Visit (HOSPITAL_BASED_OUTPATIENT_CLINIC_OR_DEPARTMENT_OTHER): Payer: Medicare Other

## 2013-08-08 VITALS — BP 141/82 | HR 65 | Temp 98.4°F

## 2013-08-08 DIAGNOSIS — C8239 Follicular lymphoma grade IIIa, extranodal and solid organ sites: Secondary | ICD-10-CM

## 2013-08-08 DIAGNOSIS — Z5189 Encounter for other specified aftercare: Secondary | ICD-10-CM

## 2013-08-08 DIAGNOSIS — C8299 Follicular lymphoma, unspecified, extranodal and solid organ sites: Secondary | ICD-10-CM | POA: Diagnosis not present

## 2013-08-08 MED ORDER — PEGFILGRASTIM INJECTION 6 MG/0.6ML
6.0000 mg | Freq: Once | SUBCUTANEOUS | Status: AC
  Administered 2013-08-08: 6 mg via SUBCUTANEOUS
  Filled 2013-08-08: qty 0.6

## 2013-08-08 NOTE — Patient Instructions (Signed)

## 2013-08-14 ENCOUNTER — Telehealth: Payer: Self-pay | Admitting: *Deleted

## 2013-08-14 NOTE — Telephone Encounter (Signed)
No , she can stop now.

## 2013-08-14 NOTE — Telephone Encounter (Signed)
Informed pt ok to not take Allopurinol per Dr. Bertis Ruddy and I will remove it from her med list.  She verbalized understanding.

## 2013-08-14 NOTE — Telephone Encounter (Signed)
Pt states reviewing her medications w/ our med list.  She states has not been taking Allopurinol as per her med list and asks if she is supposed to be taking it, then she will need a Rx.

## 2013-08-19 ENCOUNTER — Encounter (HOSPITAL_COMMUNITY): Payer: Self-pay

## 2013-08-20 ENCOUNTER — Telehealth: Payer: Self-pay | Admitting: Hematology and Oncology

## 2013-08-20 NOTE — Telephone Encounter (Signed)
Faxed pt medical records to [facility]

## 2013-08-27 ENCOUNTER — Telehealth: Payer: Self-pay | Admitting: Hematology and Oncology

## 2013-08-27 ENCOUNTER — Ambulatory Visit (HOSPITAL_BASED_OUTPATIENT_CLINIC_OR_DEPARTMENT_OTHER): Payer: Medicare Other | Admitting: Hematology and Oncology

## 2013-08-27 ENCOUNTER — Encounter: Payer: Self-pay | Admitting: Hematology and Oncology

## 2013-08-27 ENCOUNTER — Ambulatory Visit (HOSPITAL_BASED_OUTPATIENT_CLINIC_OR_DEPARTMENT_OTHER): Payer: Medicare Other

## 2013-08-27 ENCOUNTER — Other Ambulatory Visit (HOSPITAL_BASED_OUTPATIENT_CLINIC_OR_DEPARTMENT_OTHER): Payer: Medicare Other

## 2013-08-27 ENCOUNTER — Ambulatory Visit: Payer: Medicare Other | Admitting: Nutrition

## 2013-08-27 VITALS — BP 122/73 | HR 79 | Temp 97.9°F | Resp 18

## 2013-08-27 VITALS — BP 135/72 | HR 75 | Temp 98.4°F | Resp 20 | Ht 62.0 in | Wt 179.8 lb

## 2013-08-27 DIAGNOSIS — C8239 Follicular lymphoma grade IIIa, extranodal and solid organ sites: Secondary | ICD-10-CM

## 2013-08-27 DIAGNOSIS — K649 Unspecified hemorrhoids: Secondary | ICD-10-CM

## 2013-08-27 DIAGNOSIS — G609 Hereditary and idiopathic neuropathy, unspecified: Secondary | ICD-10-CM | POA: Diagnosis not present

## 2013-08-27 DIAGNOSIS — C8299 Follicular lymphoma, unspecified, extranodal and solid organ sites: Secondary | ICD-10-CM | POA: Diagnosis not present

## 2013-08-27 DIAGNOSIS — K5900 Constipation, unspecified: Secondary | ICD-10-CM | POA: Diagnosis not present

## 2013-08-27 DIAGNOSIS — D649 Anemia, unspecified: Secondary | ICD-10-CM

## 2013-08-27 DIAGNOSIS — Z5111 Encounter for antineoplastic chemotherapy: Secondary | ICD-10-CM

## 2013-08-27 DIAGNOSIS — Z5112 Encounter for antineoplastic immunotherapy: Secondary | ICD-10-CM

## 2013-08-27 LAB — CBC WITH DIFFERENTIAL/PLATELET
BASO%: 1.1 % (ref 0.0–2.0)
Eosinophils Absolute: 0 10*3/uL (ref 0.0–0.5)
HCT: 33.4 % — ABNORMAL LOW (ref 34.8–46.6)
LYMPH%: 6.9 % — ABNORMAL LOW (ref 14.0–49.7)
MCH: 29.6 pg (ref 25.1–34.0)
MCHC: 34.2 g/dL (ref 31.5–36.0)
MCV: 86.7 fL (ref 79.5–101.0)
MONO#: 0.2 10*3/uL (ref 0.1–0.9)
MONO%: 3.9 % (ref 0.0–14.0)
NEUT%: 87.7 % — ABNORMAL HIGH (ref 38.4–76.8)
Platelets: 201 10*3/uL (ref 145–400)
RDW: 17.5 % — ABNORMAL HIGH (ref 11.2–14.5)
WBC: 5.3 10*3/uL (ref 3.9–10.3)

## 2013-08-27 LAB — LACTATE DEHYDROGENASE (CC13): LDH: 239 U/L (ref 125–245)

## 2013-08-27 LAB — COMPREHENSIVE METABOLIC PANEL (CC13)
Anion Gap: 11 mEq/L (ref 3–11)
CO2: 25 mEq/L (ref 22–29)
Creatinine: 0.9 mg/dL (ref 0.6–1.1)
Glucose: 110 mg/dl (ref 70–140)
Potassium: 3.9 mEq/L (ref 3.5–5.1)
Total Bilirubin: 0.5 mg/dL (ref 0.20–1.20)

## 2013-08-27 MED ORDER — ACETAMINOPHEN 325 MG PO TABS
650.0000 mg | ORAL_TABLET | Freq: Once | ORAL | Status: AC
  Administered 2013-08-27: 650 mg via ORAL

## 2013-08-27 MED ORDER — SODIUM CHLORIDE 0.9 % IJ SOLN
10.0000 mL | INTRAMUSCULAR | Status: DC | PRN
  Administered 2013-08-27: 10 mL
  Filled 2013-08-27: qty 10

## 2013-08-27 MED ORDER — ACETAMINOPHEN 325 MG PO TABS
ORAL_TABLET | ORAL | Status: AC
  Filled 2013-08-27: qty 2

## 2013-08-27 MED ORDER — SODIUM CHLORIDE 0.9 % IV SOLN
600.0000 mg/m2 | Freq: Once | INTRAVENOUS | Status: AC
  Administered 2013-08-27: 1160 mg via INTRAVENOUS
  Filled 2013-08-27: qty 58

## 2013-08-27 MED ORDER — DIPHENHYDRAMINE HCL 25 MG PO CAPS
ORAL_CAPSULE | ORAL | Status: AC
  Filled 2013-08-27: qty 2

## 2013-08-27 MED ORDER — DIPHENHYDRAMINE HCL 25 MG PO CAPS
50.0000 mg | ORAL_CAPSULE | Freq: Once | ORAL | Status: AC
  Administered 2013-08-27: 50 mg via ORAL

## 2013-08-27 MED ORDER — SODIUM CHLORIDE 0.9 % IV SOLN
Freq: Once | INTRAVENOUS | Status: AC
  Administered 2013-08-27: 10:00:00 via INTRAVENOUS

## 2013-08-27 MED ORDER — ONDANSETRON 16 MG/50ML IVPB (CHCC)
16.0000 mg | Freq: Once | INTRAVENOUS | Status: AC
  Administered 2013-08-27: 16 mg via INTRAVENOUS

## 2013-08-27 MED ORDER — ONDANSETRON 16 MG/50ML IVPB (CHCC)
INTRAVENOUS | Status: AC
  Filled 2013-08-27: qty 16

## 2013-08-27 MED ORDER — DOXORUBICIN HCL CHEMO IV INJECTION 2 MG/ML
40.0000 mg/m2 | Freq: Once | INTRAVENOUS | Status: AC
  Administered 2013-08-27: 76 mg via INTRAVENOUS
  Filled 2013-08-27: qty 38

## 2013-08-27 MED ORDER — DEXAMETHASONE SODIUM PHOSPHATE 20 MG/5ML IJ SOLN
INTRAMUSCULAR | Status: AC
  Filled 2013-08-27: qty 5

## 2013-08-27 MED ORDER — DEXAMETHASONE SODIUM PHOSPHATE 20 MG/5ML IJ SOLN
20.0000 mg | Freq: Once | INTRAMUSCULAR | Status: AC
  Administered 2013-08-27: 20 mg via INTRAVENOUS

## 2013-08-27 MED ORDER — SODIUM CHLORIDE 0.9 % IV SOLN
375.0000 mg/m2 | Freq: Once | INTRAVENOUS | Status: AC
  Administered 2013-08-27: 700 mg via INTRAVENOUS
  Filled 2013-08-27: qty 70

## 2013-08-27 MED ORDER — HEPARIN SOD (PORK) LOCK FLUSH 100 UNIT/ML IV SOLN
500.0000 [IU] | Freq: Once | INTRAVENOUS | Status: AC | PRN
  Administered 2013-08-27: 500 [IU]
  Filled 2013-08-27: qty 5

## 2013-08-27 MED ORDER — VINCRISTINE SULFATE CHEMO INJECTION 1 MG/ML
2.0000 mg | Freq: Once | INTRAVENOUS | Status: AC
  Administered 2013-08-27: 2 mg via INTRAVENOUS
  Filled 2013-08-27: qty 2

## 2013-08-27 NOTE — Progress Notes (Signed)
This patient is a 67 year old female diagnosed with lymphoma.  Past medical history includes CAD, GERD, hypertension, hyperlipidemia, and thyroid disease.  Medications include Magic mouthwash, zinc, vitamin D, Colace, fish oil, Zofran, probiotics, Compazine, vitamin B6, red yeast rice, and vitamin C.    Labs were reviewed.  Height: 62 inches. Weight: 179.8 pounds December 17. Usual body weight: 185 pounds. BMI: 32.88.  Patient requesting information on a healthy diet.  She reports good appetite and good oral intake.  She denies nutrition side effects.  Her weight is within her usual body weight range.  Nutrition diagnosis: Food and nutrition related knowledge deficit related to diagnosis of lymphoma and associated treatments as evidenced by no prior need for nutrition related information.  Intervention: Patient was educated on the importance of a healthy, plant-based diet with adequate lean protein sources.  I educated her on organic foods at her request.  I've discussed the importance of increased vegetables and whole fruits, as well as whole gr.  Patient was encouraged to attempt weight maintenance throughout treatment.  Handouts were provided.  Teach back method used.  Monitoring, evaluation, goals: Patient will tolerate adequate calories and protein for weight maintenance consuming a healthy, plant-based diet.  Next visit: No followup scheduled.  Patient will contact me with questions or concerns.

## 2013-08-27 NOTE — Patient Instructions (Signed)
[facility] Discharge Instructions for Patients Receiving Chemotherapy  Today you received the following chemotherapy agents Doxorubicin, Oncovin, Cytoxan and Rituxan.  To help prevent nausea and vomiting after your treatment, we encourage you to take your nausea medication.   If you develop nausea and vomiting that is not controlled by your nausea medication, call the clinic.   BELOW ARE SYMPTOMS THAT SHOULD BE REPORTED IMMEDIATELY:  *FEVER GREATER THAN 100.5 F  *CHILLS WITH OR WITHOUT FEVER  NAUSEA AND VOMITING THAT IS NOT CONTROLLED WITH YOUR NAUSEA MEDICATION  *UNUSUAL SHORTNESS OF BREATH  *UNUSUAL BRUISING OR BLEEDING  TENDERNESS IN MOUTH AND THROAT WITH OR WITHOUT PRESENCE OF ULCERS  *URINARY PROBLEMS  *BOWEL PROBLEMS  UNUSUAL RASH Items with * indicate a potential emergency and should be followed up as soon as possible.  Feel free to call the clinic you have any questions or concerns. The clinic phone number is [phone].

## 2013-08-27 NOTE — Telephone Encounter (Signed)
gv and printed appt sched and avs for pt for Dec and Jan 2015 ...sed added tx....MD did not have available tim in morning on 1.7.14...sed added tx

## 2013-08-27 NOTE — Progress Notes (Signed)
[facility] OFFICE PROGRESS NOTE  Patient Care Team: Mirna Mires, MD as PCP - General (Family Medicine) Artis Delay, MD as Consulting Physician (Hematology and Oncology)  DIAGNOSIS: High-grade follicular lymphoma, ongoing chemotherapy  SUMMARY OF ONCOLOGIC HISTORY: Oncology History   Follicular lymphoma grade IIIa of extranodal and solid organ sites Follicular lymphoma (Resolved on 07/02/2013)   Primary site: Lymphoid Neoplasms (Bilateral)   Staging method: AJCC 6th Edition   Clinical free text: Stage IVA, Follicular lymphoma high grade   Clinical: Stage IV signed by Artis Delay, MD on 07/02/2013  8:09 AM   Pathologic: Stage IV signed by Artis Delay, MD on 07/02/2013  8:10 AM   Summary: Stage IV         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 Needle biopsy was inconclusive.     08/02/2011 Imaging CT scan of the chest abdomen and pelvis showed numerous lymphadenopathy above and below the diaphragm.    08/16/2011 Imaging MRI of the abdomen revealed enlarging left kidney lesion worrisome for malignancy   09/20/2011 Initial Diagnosis Lymphoma, low grade from kidney biopsy, follicular   03/28/2013 Procedure Histologic type: Non-Hodgkin's lymphoma, follicular center cell type. Grade (if applicable): Favor high grade, Ki-67 ranges from 10-50%. Immunohistochemical stains: CD20, CD79a, CD10, BCL-2, BCL-6, CD21, CD3, CD43.   05/09/2013 Procedure BM biopsy is highly suspicious of involvement. Patient had persistent leukopenia   05/09/2013 Imaging PET/CT showed significant involvement of LN everywhere   07/16/2013 -  Chemotherapy Start cycle 1 of RCHOP   07/23/2013 - 07/27/2013 Hospital Admission Admitted for management of mucositis and neutropenic fever. Blood culture was positive for gram negative rods, resolved with antibiotics    Follicular lymphoma  (Resolved)    INTERVAL HISTORY: [name] 67 y.o. female returns for further followup. She had no complications with cycle 2 of chemotherapy. With dosage adjustment, she denies any mucositis. From her previous chemotherapy she had hemorrhoidal bleeding. That has resolved. She has very mild peripheral neuropathy but that predominantly affected her left hand when she lies down.  I have reviewed the past medical history, past surgical history, social history and family history with the patient and they are unchanged from previous note.  ALLERGIES:  is allergic to furosemide and latex.  MEDICATIONS:  Current Outpatient Prescriptions  Medication Sig Dispense Refill  . Alum & Mag Hydroxide-Simeth (MAGIC MOUTHWASH W/LIDOCAINE) SOLN Take 5 mLs by mouth 4 (four) times daily.  240 mL  2  . aspirin 81 MG tablet Take 81 mg by mouth at bedtime.       . Calcium-Magnesium-Zinc (CAL-MAG-ZINC PO) Take 1 tablet by mouth daily.       . carboxymethylcellulose (REFRESH PLUS) 0.5 % SOLN Place 1 drop into both eyes daily as needed (DRY EYES).      . Cholecalciferol (VITAMIN D) 2000 UNITS tablet Take 2,000 Units by mouth daily.      . clobetasol cream (TEMOVATE) 0.05 % Apply 1 application topically daily. Apply to affected area      . docusate sodium (COLACE) 100 MG capsule Take 1 capsule (100 mg total) by mouth 2 (two) times daily.  100 capsule  1  . lansoprazole (PREVACID) 30 MG capsule Take 30 mg by mouth daily.       Marland Kitchen lidocaine-prilocaine (EMLA) cream Apply topically as needed.  30 g  0  . losartan-hydrochlorothiazide (HYZAAR) 100-12.5 MG per  tablet Take 1 tablet by mouth every evening.       . nystatin-triamcinolone ointment (MYCOLOG) Apply 1 application topically 2 (two) times daily.  30 g  0  . Omega-3 Fatty Acids (FISH OIL) 1200 MG CAPS Take 2 capsules by mouth daily.       . ondansetron (ZOFRAN) 8 MG tablet Take 8 mg by mouth every 8 (eight) hours as needed for nausea or vomiting.      .  potassium chloride SA (K-DUR,KLOR-CON) 20 MEQ tablet Take 20 mEq by mouth daily.       . Probiotic Product (PROBIOTIC DAILY PO) Take 1 capsule by mouth daily.      . prochlorperazine (COMPAZINE) 10 MG tablet Take 1 tablet (10 mg total) by mouth every 6 (six) hours as needed (Nausea or vomiting).  30 tablet  6  . pyridOXINE (VITAMIN B-6) 100 MG tablet Take 100 mg by mouth daily.       . Red Yeast Rice 600 MG CAPS Take 1 capsule by mouth every evening.       . vitamin C (ASCORBIC ACID) 500 MG tablet Take 500 mg by mouth daily.       No current facility-administered medications for this visit.    REVIEW OF SYSTEMS:   Constitutional: Denies fevers, chills or abnormal weight loss Eyes: Denies blurriness of vision Ears, nose, mouth, throat, and face: Denies mucositis or sore throat Respiratory: Denies cough, dyspnea or wheezes Cardiovascular: Denies palpitation, chest discomfort or lower extremity swelling Gastrointestinal:  Denies nausea, heartburn or change in bowel habits Skin: Denies abnormal skin rashes Lymphatics: Denies new lymphadenopathy or easy bruising Neurological:Denies numbness, tingling or new weaknesses Behavioral/Psych: Mood is stable, no new changes  All other systems were reviewed with the patient and are negative.  PHYSICAL EXAMINATION: ECOG PERFORMANCE STATUS: 0 - Asymptomatic  Filed Vitals:   08/27/13 0849  BP: 135/72  Pulse: 75  Temp: 98.4 F (36.9 C)  Resp: 20   Filed Weights   08/27/13 0849  Weight: 179 lb 12.8 oz (81.557 kg)    GENERAL:alert, no distress and comfortable SKIN: skin color, texture, turgor are normal, no rashes or significant lesions EYES: normal, Conjunctiva are pink and non-injected, sclera clear OROPHARYNX:no exudate, no erythema and lips, buccal mucosa, and tongue normal  NECK: supple, thyroid normal size, non-tender, without nodularity LYMPH:  no palpable lymphadenopathy in the cervical, axillary or inguinal LUNGS: clear to  auscultation and percussion with normal breathing effort HEART: regular rate & rhythm and no murmurs and no lower extremity edema ABDOMEN:abdomen soft, non-tender and normal bowel sounds Musculoskeletal:no cyanosis of digits and no clubbing  NEURO: alert & oriented x 3 with fluent speech, no focal motor/sensory deficits  LABORATORY DATA:  I have reviewed the data as listed    Component Value Date/Time   NA 141 08/27/2013 0824   NA 140 07/27/2013 0550   K 3.9 08/27/2013 0824   K 3.6 07/27/2013 0550   CL 107 07/27/2013 0550   CO2 25 08/27/2013 0824   CO2 25 07/27/2013 0550   GLUCOSE 110 08/27/2013 0824   GLUCOSE 101* 07/27/2013 0550   BUN 13.5 08/27/2013 0824   BUN 12 07/27/2013 0550   CREATININE 0.9 08/27/2013 0824   CREATININE 0.84 07/27/2013 0550   CALCIUM 9.8 08/27/2013 0824   CALCIUM 8.6 07/27/2013 0550   PROT 7.1 08/27/2013 0824   PROT 6.8 07/24/2013 0550   ALBUMIN 4.1 08/27/2013 0824   ALBUMIN 3.4* 07/24/2013 0550   AST 23  08/27/2013 0824   AST 19 07/24/2013 0550   ALT 22 08/27/2013 0824   ALT 30 07/24/2013 0550   ALKPHOS 66 08/27/2013 0824   ALKPHOS 70 07/24/2013 0550   BILITOT 0.50 08/27/2013 0824   BILITOT 1.1 07/24/2013 0550   GFRNONAA 70* 07/27/2013 0550   GFRAA 82* 07/27/2013 0550    No results found for this basename: SPEP,  UPEP,   kappa and lambda light chains    Lab Results  Component Value Date   WBC 5.3 08/27/2013   NEUTROABS 4.7 08/27/2013   HGB 11.4* 08/27/2013   HCT 33.4* 08/27/2013   MCV 86.7 08/27/2013   PLT 201 08/27/2013      Chemistry      Component Value Date/Time   NA 141 08/27/2013 0824   NA 140 07/27/2013 0550   K 3.9 08/27/2013 0824   K 3.6 07/27/2013 0550   CL 107 07/27/2013 0550   CO2 25 08/27/2013 0824   CO2 25 07/27/2013 0550   BUN 13.5 08/27/2013 0824   BUN 12 07/27/2013 0550   CREATININE 0.9 08/27/2013 0824   CREATININE 0.84 07/27/2013 0550      Component Value Date/Time   CALCIUM 9.8 08/27/2013 0824   CALCIUM  8.6 07/27/2013 0550   ALKPHOS 66 08/27/2013 0824   ALKPHOS 70 07/24/2013 0550   AST 23 08/27/2013 0824   AST 19 07/24/2013 0550   ALT 22 08/27/2013 0824   ALT 30 07/24/2013 0550   BILITOT 0.50 08/27/2013 0824   BILITOT 1.1 07/24/2013 0550       ASSESSMENT & PLAN:  #1 follicular lymphoma She developed complication with neutropenic fever, despite Neulasta support with cycle 1 of treatment. With her chemotherapy dosage adjustment by 20% from cycle 2 onwards, she did well. We will continue Neulasta support with each cycle. #2 mild anemia This is likely due to recent treatment. The patient denies recent history of bleeding such as epistaxis, hematuria or hematochezia. She is asymptomatic from the anemia. I will observe for now.  She does not require transfusion now. I will continue the chemotherapy at current dose without dosage adjustment.  If the anemia gets progressive worse in the future, I might have to delay her treatment or adjust the chemotherapy dose. #3 constipation with hemorrhoids I have prescribed stool softener for her. I also recommend she stop taking her calcium supplements.  Orders Placed This Encounter  Procedures  . Comprehensive metabolic panel    Standing Status: Standing     Number of Occurrences: 9     Standing Expiration Date: 08/27/2014  . CBC with Differential    Standing Status: Standing     Number of Occurrences: 9     Standing Expiration Date: 08/27/2014  . Lactate dehydrogenase    Standing Status: Standing     Number of Occurrences: 9     Standing Expiration Date: 08/27/2014   All questions were answered. The patient knows to call the clinic with any problems, questions or concerns. No barriers to learning was detected. I spent 25 minutes counseling the patient face to face. The total time spent in the appointment was 40 minutes and more than 50% was on counseling and review of test results     [facility], Nole Robey, MD 08/27/2013 9:09 AM

## 2013-08-28 ENCOUNTER — Ambulatory Visit (HOSPITAL_BASED_OUTPATIENT_CLINIC_OR_DEPARTMENT_OTHER): Payer: Medicare Other

## 2013-08-28 VITALS — BP 136/80 | HR 73 | Temp 98.3°F

## 2013-08-28 DIAGNOSIS — Z5189 Encounter for other specified aftercare: Secondary | ICD-10-CM | POA: Diagnosis not present

## 2013-08-28 DIAGNOSIS — C8239 Follicular lymphoma grade IIIa, extranodal and solid organ sites: Secondary | ICD-10-CM

## 2013-08-28 DIAGNOSIS — C8299 Follicular lymphoma, unspecified, extranodal and solid organ sites: Secondary | ICD-10-CM | POA: Diagnosis not present

## 2013-08-28 MED ORDER — PEGFILGRASTIM INJECTION 6 MG/0.6ML
6.0000 mg | Freq: Once | SUBCUTANEOUS | Status: AC
  Administered 2013-08-28: 6 mg via SUBCUTANEOUS
  Filled 2013-08-28: qty 0.6

## 2013-09-02 ENCOUNTER — Telehealth: Payer: Self-pay | Admitting: *Deleted

## 2013-09-02 NOTE — Telephone Encounter (Signed)
Pt reports she cut her finger on Saturday.  No signs of infection.  She is keeping neosporin on it and wrapped.  She reports has had 4 bouts of diarrhea since last night and she is feeling more tired than usual today.  Denies any fevers, n/v or any other new symptoms. Informed pt that diarrhea and fatigue can be normal side effect from chemo.  Pt states she is holding her stool softener until diarrhea resolves.  Instructed to drink plenty of fluid and call back if any fevers or worsening diarrhea and to monitor finger for signs of infection such as redness, warmth or swelling.  Pt verbalized understanding.

## 2013-09-16 ENCOUNTER — Telehealth: Payer: Self-pay | Admitting: *Deleted

## 2013-09-16 ENCOUNTER — Telehealth: Payer: Self-pay | Admitting: Hematology and Oncology

## 2013-09-16 ENCOUNTER — Encounter: Payer: Self-pay | Admitting: Hematology and Oncology

## 2013-09-16 ENCOUNTER — Ambulatory Visit (HOSPITAL_BASED_OUTPATIENT_CLINIC_OR_DEPARTMENT_OTHER): Payer: Medicare Other | Admitting: Hematology and Oncology

## 2013-09-16 ENCOUNTER — Other Ambulatory Visit (HOSPITAL_BASED_OUTPATIENT_CLINIC_OR_DEPARTMENT_OTHER): Payer: Medicare Other

## 2013-09-16 VITALS — BP 138/72 | HR 71 | Temp 98.0°F | Resp 18 | Ht 62.0 in | Wt 180.7 lb

## 2013-09-16 DIAGNOSIS — D649 Anemia, unspecified: Secondary | ICD-10-CM | POA: Diagnosis not present

## 2013-09-16 DIAGNOSIS — D72819 Decreased white blood cell count, unspecified: Secondary | ICD-10-CM | POA: Diagnosis not present

## 2013-09-16 DIAGNOSIS — G620 Drug-induced polyneuropathy: Secondary | ICD-10-CM

## 2013-09-16 DIAGNOSIS — C8299 Follicular lymphoma, unspecified, extranodal and solid organ sites: Secondary | ICD-10-CM

## 2013-09-16 DIAGNOSIS — C8239 Follicular lymphoma grade IIIa, extranodal and solid organ sites: Secondary | ICD-10-CM

## 2013-09-16 DIAGNOSIS — D61810 Antineoplastic chemotherapy induced pancytopenia: Secondary | ICD-10-CM

## 2013-09-16 DIAGNOSIS — T451X5A Adverse effect of antineoplastic and immunosuppressive drugs, initial encounter: Secondary | ICD-10-CM

## 2013-09-16 DIAGNOSIS — D630 Anemia in neoplastic disease: Secondary | ICD-10-CM | POA: Insufficient documentation

## 2013-09-16 HISTORY — DX: Anemia in neoplastic disease: D63.0

## 2013-09-16 HISTORY — DX: Adverse effect of antineoplastic and immunosuppressive drugs, initial encounter: T45.1X5A

## 2013-09-16 HISTORY — DX: Adverse effect of antineoplastic and immunosuppressive drugs, initial encounter: D61.810

## 2013-09-16 LAB — CBC WITH DIFFERENTIAL/PLATELET
BASO%: 2 % (ref 0.0–2.0)
Basophils Absolute: 0.1 10*3/uL (ref 0.0–0.1)
EOS%: 0.5 % (ref 0.0–7.0)
Eosinophils Absolute: 0 10*3/uL (ref 0.0–0.5)
HCT: 29.8 % — ABNORMAL LOW (ref 34.8–46.6)
HGB: 10.5 g/dL — ABNORMAL LOW (ref 11.6–15.9)
LYMPH%: 15 % (ref 14.0–49.7)
MCH: 30.9 pg (ref 25.1–34.0)
MCHC: 35.4 g/dL (ref 31.5–36.0)
MCV: 87.3 fL (ref 79.5–101.0)
MONO#: 0.4 10*3/uL (ref 0.1–0.9)
MONO%: 13.1 % (ref 0.0–14.0)
NEUT%: 69.4 % (ref 38.4–76.8)
NEUTROS ABS: 2.3 10*3/uL (ref 1.5–6.5)
Platelets: 174 10*3/uL (ref 145–400)
RBC: 3.42 10*6/uL — AB (ref 3.70–5.45)
RDW: 18.6 % — AB (ref 11.2–14.5)
WBC: 3.4 10*3/uL — ABNORMAL LOW (ref 3.9–10.3)
lymph#: 0.5 10*3/uL — ABNORMAL LOW (ref 0.9–3.3)

## 2013-09-16 LAB — COMPREHENSIVE METABOLIC PANEL (CC13)
ALT: 21 U/L (ref 0–55)
AST: 19 U/L (ref 5–34)
Albumin: 3.9 g/dL (ref 3.5–5.0)
Alkaline Phosphatase: 68 U/L (ref 40–150)
Anion Gap: 7 mEq/L (ref 3–11)
BUN: 12.5 mg/dL (ref 7.0–26.0)
CALCIUM: 9.4 mg/dL (ref 8.4–10.4)
CO2: 28 mEq/L (ref 22–29)
Chloride: 106 mEq/L (ref 98–109)
Creatinine: 0.8 mg/dL (ref 0.6–1.1)
Glucose: 85 mg/dl (ref 70–140)
POTASSIUM: 3.6 meq/L (ref 3.5–5.1)
Sodium: 141 mEq/L (ref 136–145)
Total Bilirubin: 0.55 mg/dL (ref 0.20–1.20)
Total Protein: 6.6 g/dL (ref 6.4–8.3)

## 2013-09-16 LAB — LACTATE DEHYDROGENASE (CC13): LDH: 235 U/L (ref 125–245)

## 2013-09-16 NOTE — Progress Notes (Signed)
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 Needle biopsy was inconclusive.     08/02/2011 Imaging CT scan of the chest abdomen and pelvis showed numerous lymphadenopathy above and below the diaphragm.    08/16/2011 Imaging MRI of the abdomen revealed enlarging left kidney lesion worrisome for malignancy   09/20/2011 Initial Diagnosis Lymphoma, low grade from kidney biopsy, follicular   9/39/0300 Procedure Histologic type: Non-Hodgkin's lymphoma, follicular center cell type. Grade (if applicable): Favor high grade, Ki-67 ranges from 10-50%. Immunohistochemical stains: CD20, CD79a, CD10, BCL-2, BCL-6, CD21, CD3, CD43.   05/09/2013 Procedure BM biopsy is highly suspicious of involvement. Patient had persistent leukopenia   05/09/2013 Imaging PET/CT showed significant involvement of LN everywhere   07/16/2013 -  Chemotherapy Start cycle 1 of RCHOP   07/23/2013 - 07/27/2013 Hospital Admission Admitted for management of mucositis and neutropenic fever. Blood culture was positive for gram negative rods, resolved with antibiotics     Follicular lymphoma (Resolved)    INTERVAL HISTORY: [name] 68 y.o. female returns for further followup. She denies any side effects from recent treatment. She denies any recent fever, chills, night sweats or abnormal weight loss The patient denies any mouth sores, nausea, vomiting or change in bowel habits Denies any recent hemorrhoidal bleeding. She is to have her mild peripheral neuropathy effecting the tips of her left fingers occasionally but it comes and goes I have reviewed the past medical history, past surgical history, social history and family history with the patient and they are unchanged from previous note.  ALLERGIES:  is allergic to latex.  MEDICATIONS:  Current Outpatient Prescriptions  Medication Sig Dispense Refill  . acetaminophen (TYLENOL) 325 MG tablet Take 325 mg by mouth every 6 (six) hours as needed.      . Alum & Mag Hydroxide-Simeth (MAGIC MOUTHWASH W/LIDOCAINE) SOLN Take 5 mLs by mouth 4 (four) times daily.  240 mL  2  . aspirin 81 MG tablet Take 81 mg by mouth at bedtime.       . Calcium-Magnesium-Zinc (CAL-MAG-ZINC PO) Take 1 tablet by mouth daily.       . carboxymethylcellulose (REFRESH PLUS) 0.5 % SOLN Place 1 drop into both eyes daily as needed (DRY EYES).      . Cholecalciferol (VITAMIN D) 2000 UNITS tablet Take 2,000 Units by mouth daily.      . clobetasol cream (TEMOVATE) 9.23 % Apply 1 application topically daily. Apply to affected area      . docusate sodium (COLACE) 100 MG capsule Take 1 capsule (100 mg total) by mouth 2 (two) times daily.  100 capsule  1  .  lansoprazole (PREVACID) 30 MG capsule Take 30 mg by mouth daily.       Marland Kitchen lidocaine-prilocaine (EMLA) cream Apply topically as needed.  30 g  0  . losartan-hydrochlorothiazide (HYZAAR) 100-12.5 MG per tablet Take 1 tablet by mouth every evening.       . nystatin-triamcinolone ointment (MYCOLOG) Apply 1 application topically 2 (two) times daily.  30 g  0  . Omega-3 Fatty Acids (FISH OIL)  1200 MG CAPS Take 2 capsules by mouth daily.       . ondansetron (ZOFRAN) 8 MG tablet Take 8 mg by mouth every 8 (eight) hours as needed for nausea or vomiting.      . potassium chloride SA (K-DUR,KLOR-CON) 20 MEQ tablet Take 20 mEq by mouth daily.       . Probiotic Product (PROBIOTIC DAILY PO) Take 1 capsule by mouth daily.      . prochlorperazine (COMPAZINE) 10 MG tablet Take 1 tablet (10 mg total) by mouth every 6 (six) hours as needed (Nausea or vomiting).  30 tablet  6  . pyridOXINE (VITAMIN B-6) 100 MG tablet Take 100 mg by mouth daily.       . Red Yeast Rice 600 MG CAPS Take 1 capsule by mouth every evening.       . vitamin C (ASCORBIC ACID) 500 MG tablet Take 500 mg by mouth daily.       No current facility-administered medications for this visit.    REVIEW OF SYSTEMS:   Constitutional: Denies fevers, chills or abnormal weight loss Eyes: Denies blurriness of vision Ears, nose, mouth, throat, and face: Denies mucositis or sore throat Respiratory: Denies cough, dyspnea or wheezes Cardiovascular: Denies palpitation, chest discomfort or lower extremity swelling Gastrointestinal:  Denies nausea, heartburn or change in bowel habits Skin: Denies abnormal skin rashes Lymphatics: Denies new lymphadenopathy or easy bruising Behavioral/Psych: Mood is stable, no new changes  All other systems were reviewed with the patient and are negative.  PHYSICAL EXAMINATION: ECOG PERFORMANCE STATUS: 0 - Asymptomatic  Filed Vitals:   09/16/13 1229  BP: 138/72  Pulse: 71  Temp: 98 F (36.7 C)  Resp: 18   Filed Weights   09/16/13 1229  Weight: 180 lb 11.2 oz (81.965 kg)    GENERAL:alert, no distress and comfortable SKIN: skin color, texture, turgor are normal, no rashes or significant lesions EYES: normal, Conjunctiva are pink and non-injected, sclera clear OROPHARYNX:no exudate, no erythema and lips, buccal mucosa, and tongue normal  NECK: supple, thyroid normal size, non-tender, without  nodularity LYMPH:  no palpable lymphadenopathy in the cervical, axillary or inguinal LUNGS: clear to auscultation and percussion with normal breathing effort HEART: regular rate & rhythm and no murmurs and no lower extremity edema ABDOMEN:abdomen soft, non-tender and normal bowel sounds Musculoskeletal:no cyanosis of digits and no clubbing  NEURO: alert & oriented x 3 with fluent speech, no focal motor/sensory deficits  LABORATORY DATA:  I have reviewed the data as listed    Component Value Date/Time   NA 141 09/16/2013 1200   NA 140 07/27/2013 0550   K 3.6 09/16/2013 1200   K 3.6 07/27/2013 0550   CL 107 07/27/2013 0550   CO2 28 09/16/2013 1200   CO2 25 07/27/2013 0550   GLUCOSE 85 09/16/2013 1200   GLUCOSE 101* 07/27/2013 0550   BUN 12.5 09/16/2013 1200   BUN 12 07/27/2013 0550   CREATININE 0.8 09/16/2013 1200   CREATININE 0.84 07/27/2013 0550   CALCIUM 9.4 09/16/2013 1200   CALCIUM  8.6 07/27/2013 0550   PROT 6.6 09/16/2013 1200   PROT 6.8 07/24/2013 0550   ALBUMIN 3.9 09/16/2013 1200   ALBUMIN 3.4* 07/24/2013 0550   AST 19 09/16/2013 1200   AST 19 07/24/2013 0550   ALT 21 09/16/2013 1200   ALT 30 07/24/2013 0550   ALKPHOS 68 09/16/2013 1200   ALKPHOS 70 07/24/2013 0550   BILITOT 0.55 09/16/2013 1200   BILITOT 1.1 07/24/2013 0550   GFRNONAA 70* 07/27/2013 0550   GFRAA 82* 07/27/2013 0550    No results found for this basename: SPEP,  UPEP,   kappa and lambda light chains    Lab Results  Component Value Date   WBC 3.4* 09/16/2013   NEUTROABS 2.3 09/16/2013   HGB 10.5* 09/16/2013   HCT 29.8* 09/16/2013   MCV 87.3 09/16/2013   PLT 174 09/16/2013      Chemistry      Component Value Date/Time   NA 141 09/16/2013 1200   NA 140 07/27/2013 0550   K 3.6 09/16/2013 1200   K 3.6 07/27/2013 0550   CL 107 07/27/2013 0550   CO2 28 09/16/2013 1200   CO2 25 07/27/2013 0550   BUN 12.5 09/16/2013 1200   BUN 12 07/27/2013 0550   CREATININE 0.8 09/16/2013 1200   CREATININE 0.84 07/27/2013 0550      Component  Value Date/Time   CALCIUM 9.4 09/16/2013 1200   CALCIUM 8.6 07/27/2013 0550   ALKPHOS 68 09/16/2013 1200   ALKPHOS 70 07/24/2013 0550   AST 19 09/16/2013 1200   AST 19 07/24/2013 0550   ALT 21 09/16/2013 1200   ALT 30 07/24/2013 0550   BILITOT 0.55 09/16/2013 1200   BILITOT 1.1 07/24/2013 0550     ASSESSMENT & PLAN:  #1 follicular lymphoma She developed complication with neutropenic fever, despite Neulasta support with cycle 1 of treatment. With her chemotherapy dosage adjustment by 20% from cycle 2 onwards, she did well. We will continue Neulasta support with each cycle. #2 mild anemia This is likely due to recent treatment. The patient denies recent history of bleeding such as epistaxis, hematuria or hematochezia. She is asymptomatic from the anemia. I will observe for now.  She does not require transfusion now. I will continue the chemotherapy at current dose without dosage adjustment.  If the anemia gets progressive worse in the future, I might have to delay her treatment or adjust the chemotherapy dose. #3 mild leukopenia This is likely due to recent treatment. The patient denies recent history of fevers, cough, chills, diarrhea or dysuria. She is asymptomatic from the leukopenia. I will observe for now.  I will continue the chemotherapy at current dose without dosage adjustment.  If the leukopenia gets progressive worse in the future, I might have to delay her treatment or adjust the chemotherapy dose. #4 mild peripheral neuropathy This is due to recent chemotherapy. It is only grade 1. We will observe for now.  All questions were answered. The patient knows to call the clinic with any problems, questions or concerns. No barriers to learning was detected. I spent 25 minutes counseling the patient face to face. The total time spent in the appointment was 40 minutes and more than 50% was on counseling and review of test results     [facility], [place], MD 09/16/2013 1:05 PM

## 2013-09-16 NOTE — Telephone Encounter (Signed)
Per staff message and POF I have scheduled appts.  JMW  

## 2013-09-16 NOTE — Telephone Encounter (Signed)
gave pt appt for lab,md and chemo for January 2015

## 2013-09-17 ENCOUNTER — Ambulatory Visit (HOSPITAL_BASED_OUTPATIENT_CLINIC_OR_DEPARTMENT_OTHER): Payer: Medicare Other

## 2013-09-17 VITALS — BP 122/69 | HR 76 | Temp 98.6°F | Resp 18

## 2013-09-17 DIAGNOSIS — C8299 Follicular lymphoma, unspecified, extranodal and solid organ sites: Secondary | ICD-10-CM | POA: Diagnosis not present

## 2013-09-17 DIAGNOSIS — Z5111 Encounter for antineoplastic chemotherapy: Secondary | ICD-10-CM | POA: Diagnosis not present

## 2013-09-17 DIAGNOSIS — C8239 Follicular lymphoma grade IIIa, extranodal and solid organ sites: Secondary | ICD-10-CM

## 2013-09-17 DIAGNOSIS — Z5112 Encounter for antineoplastic immunotherapy: Secondary | ICD-10-CM

## 2013-09-17 MED ORDER — DEXAMETHASONE SODIUM PHOSPHATE 20 MG/5ML IJ SOLN
INTRAMUSCULAR | Status: AC
  Filled 2013-09-17: qty 5

## 2013-09-17 MED ORDER — SODIUM CHLORIDE 0.9 % IJ SOLN
10.0000 mL | INTRAMUSCULAR | Status: DC | PRN
  Administered 2013-09-17: 10 mL
  Filled 2013-09-17: qty 10

## 2013-09-17 MED ORDER — ACETAMINOPHEN 325 MG PO TABS
650.0000 mg | ORAL_TABLET | Freq: Once | ORAL | Status: AC
  Administered 2013-09-17: 650 mg via ORAL

## 2013-09-17 MED ORDER — ONDANSETRON 16 MG/50ML IVPB (CHCC)
INTRAVENOUS | Status: AC
  Filled 2013-09-17: qty 16

## 2013-09-17 MED ORDER — DIPHENHYDRAMINE HCL 25 MG PO CAPS
50.0000 mg | ORAL_CAPSULE | Freq: Once | ORAL | Status: AC
  Administered 2013-09-17: 50 mg via ORAL

## 2013-09-17 MED ORDER — HEPARIN SOD (PORK) LOCK FLUSH 100 UNIT/ML IV SOLN
500.0000 [IU] | Freq: Once | INTRAVENOUS | Status: AC | PRN
  Administered 2013-09-17: 500 [IU]
  Filled 2013-09-17: qty 5

## 2013-09-17 MED ORDER — VINCRISTINE SULFATE CHEMO INJECTION 1 MG/ML
2.0000 mg | Freq: Once | INTRAVENOUS | Status: AC
  Administered 2013-09-17: 2 mg via INTRAVENOUS
  Filled 2013-09-17: qty 2

## 2013-09-17 MED ORDER — DOXORUBICIN HCL CHEMO IV INJECTION 2 MG/ML
40.0000 mg/m2 | Freq: Once | INTRAVENOUS | Status: AC
  Administered 2013-09-17: 76 mg via INTRAVENOUS
  Filled 2013-09-17: qty 38

## 2013-09-17 MED ORDER — SODIUM CHLORIDE 0.9 % IV SOLN
600.0000 mg/m2 | Freq: Once | INTRAVENOUS | Status: AC
  Administered 2013-09-17: 1160 mg via INTRAVENOUS
  Filled 2013-09-17: qty 58

## 2013-09-17 MED ORDER — ONDANSETRON 16 MG/50ML IVPB (CHCC)
16.0000 mg | Freq: Once | INTRAVENOUS | Status: AC
  Administered 2013-09-17: 16 mg via INTRAVENOUS

## 2013-09-17 MED ORDER — DIPHENHYDRAMINE HCL 25 MG PO CAPS
ORAL_CAPSULE | ORAL | Status: AC
  Filled 2013-09-17: qty 2

## 2013-09-17 MED ORDER — RITUXIMAB CHEMO INJECTION 10 MG/ML
375.0000 mg/m2 | Freq: Once | INTRAVENOUS | Status: AC
  Administered 2013-09-17: 700 mg via INTRAVENOUS
  Filled 2013-09-17: qty 70

## 2013-09-17 MED ORDER — ACETAMINOPHEN 325 MG PO TABS
ORAL_TABLET | ORAL | Status: AC
  Filled 2013-09-17: qty 2

## 2013-09-17 MED ORDER — DEXAMETHASONE SODIUM PHOSPHATE 20 MG/5ML IJ SOLN
20.0000 mg | Freq: Once | INTRAMUSCULAR | Status: AC
  Administered 2013-09-17: 20 mg via INTRAVENOUS

## 2013-09-17 MED ORDER — SODIUM CHLORIDE 0.9 % IV SOLN
Freq: Once | INTRAVENOUS | Status: AC
  Administered 2013-09-17: 10:00:00 via INTRAVENOUS

## 2013-09-17 NOTE — Progress Notes (Signed)
Good blood return pre, during, and post adriamycin push.  Pt without complaints.

## 2013-09-17 NOTE — Patient Instructions (Addendum)
[facility] Discharge Instructions for Patients Receiving Chemotherapy  Today you received the following chemotherapy agents Rituxan, Vincristine, Adriamycin, Cytoxan  To help prevent nausea and vomiting after your treatment, we encourage you to take your nausea medication Compazine 10 mg every 6 hours or Zofran 8 mg every 8 hours. If you develop nausea and vomiting that is not controlled by your nausea medication, call the clinic.   BELOW ARE SYMPTOMS THAT SHOULD BE REPORTED IMMEDIATELY:  *FEVER GREATER THAN 100.5 F  *CHILLS WITH OR WITHOUT FEVER  NAUSEA AND VOMITING THAT IS NOT CONTROLLED WITH YOUR NAUSEA MEDICATION  *UNUSUAL SHORTNESS OF BREATH  *UNUSUAL BRUISING OR BLEEDING  TENDERNESS IN MOUTH AND THROAT WITH OR WITHOUT PRESENCE OF ULCERS  *URINARY PROBLEMS  *BOWEL PROBLEMS  UNUSUAL RASH Items with * indicate a potential emergency and should be followed up as soon as possible.  Feel free to call the clinic you have any questions or concerns. The clinic phone number is [phone].

## 2013-09-18 ENCOUNTER — Ambulatory Visit (HOSPITAL_BASED_OUTPATIENT_CLINIC_OR_DEPARTMENT_OTHER): Payer: Medicare Other

## 2013-09-18 VITALS — BP 153/81 | HR 88 | Temp 98.2°F

## 2013-09-18 DIAGNOSIS — C8239 Follicular lymphoma grade IIIa, extranodal and solid organ sites: Secondary | ICD-10-CM

## 2013-09-18 DIAGNOSIS — C8299 Follicular lymphoma, unspecified, extranodal and solid organ sites: Secondary | ICD-10-CM | POA: Diagnosis not present

## 2013-09-18 MED ORDER — PEGFILGRASTIM INJECTION 6 MG/0.6ML
6.0000 mg | Freq: Once | SUBCUTANEOUS | Status: AC
  Administered 2013-09-18: 6 mg via SUBCUTANEOUS
  Filled 2013-09-18: qty 0.6

## 2013-10-06 ENCOUNTER — Encounter: Payer: Self-pay | Admitting: Obstetrics & Gynecology

## 2013-10-06 ENCOUNTER — Ambulatory Visit (INDEPENDENT_AMBULATORY_CARE_PROVIDER_SITE_OTHER): Payer: Medicare Other | Admitting: Obstetrics & Gynecology

## 2013-10-06 VITALS — BP 150/90 | Wt 181.0 lb

## 2013-10-06 DIAGNOSIS — L940 Localized scleroderma [morphea]: Secondary | ICD-10-CM | POA: Diagnosis not present

## 2013-10-06 DIAGNOSIS — L900 Lichen sclerosus et atrophicus: Secondary | ICD-10-CM

## 2013-10-06 NOTE — Progress Notes (Signed)
Patient ID: [name], female   DOB: 1946-04-07, 67 y.o.   MRN: [id number] Pt with flare of LSA, increased to twice per day last week  Exam Normal no yeast No abnormalities  Continue to titrate temovate as needed for symptom management

## 2013-10-08 ENCOUNTER — Ambulatory Visit (HOSPITAL_BASED_OUTPATIENT_CLINIC_OR_DEPARTMENT_OTHER): Payer: Medicare Other

## 2013-10-08 ENCOUNTER — Encounter: Payer: Self-pay | Admitting: Hematology and Oncology

## 2013-10-08 ENCOUNTER — Other Ambulatory Visit (HOSPITAL_BASED_OUTPATIENT_CLINIC_OR_DEPARTMENT_OTHER): Payer: Medicare Other

## 2013-10-08 ENCOUNTER — Ambulatory Visit (HOSPITAL_COMMUNITY)
Admission: RE | Admit: 2013-10-08 | Discharge: 2013-10-08 | Disposition: A | Discharge status: Home / Self Care | Payer: Medicare Other | Source: Ambulatory Visit | Attending: Hematology and Oncology | Admitting: Hematology and Oncology

## 2013-10-08 ENCOUNTER — Telehealth: Payer: Self-pay | Admitting: Hematology and Oncology

## 2013-10-08 ENCOUNTER — Other Ambulatory Visit: Payer: Self-pay | Admitting: *Deleted

## 2013-10-08 ENCOUNTER — Ambulatory Visit (HOSPITAL_BASED_OUTPATIENT_CLINIC_OR_DEPARTMENT_OTHER): Payer: Medicare Other | Admitting: Hematology and Oncology

## 2013-10-08 VITALS — BP 144/70 | HR 77 | Temp 97.7°F | Resp 18 | Ht 62.0 in | Wt 180.3 lb

## 2013-10-08 VITALS — BP 127/63 | HR 76 | Temp 98.3°F | Resp 18

## 2013-10-08 DIAGNOSIS — D649 Anemia, unspecified: Secondary | ICD-10-CM | POA: Diagnosis not present

## 2013-10-08 DIAGNOSIS — Z5111 Encounter for antineoplastic chemotherapy: Secondary | ICD-10-CM

## 2013-10-08 DIAGNOSIS — Z5112 Encounter for antineoplastic immunotherapy: Secondary | ICD-10-CM | POA: Diagnosis not present

## 2013-10-08 DIAGNOSIS — C8239 Follicular lymphoma grade IIIa, extranodal and solid organ sites: Secondary | ICD-10-CM

## 2013-10-08 DIAGNOSIS — C8299 Follicular lymphoma, unspecified, extranodal and solid organ sites: Secondary | ICD-10-CM

## 2013-10-08 DIAGNOSIS — M952 Other acquired deformity of head: Secondary | ICD-10-CM

## 2013-10-08 DIAGNOSIS — C8581 Other specified types of non-Hodgkin lymphoma, lymph nodes of head, face, and neck: Secondary | ICD-10-CM | POA: Diagnosis not present

## 2013-10-08 DIAGNOSIS — G609 Hereditary and idiopathic neuropathy, unspecified: Secondary | ICD-10-CM | POA: Diagnosis not present

## 2013-10-08 HISTORY — DX: Other acquired deformity of head: M95.2

## 2013-10-08 LAB — COMPREHENSIVE METABOLIC PANEL (CC13)
ALBUMIN: 4.5 g/dL (ref 3.5–5.0)
ALK PHOS: 73 U/L (ref 40–150)
ALT: 23 U/L (ref 0–55)
ANION GAP: 12 meq/L — AB (ref 3–11)
AST: 26 U/L (ref 5–34)
BILIRUBIN TOTAL: 0.75 mg/dL (ref 0.20–1.20)
BUN: 9.7 mg/dL (ref 7.0–26.0)
CO2: 25 meq/L (ref 22–29)
Calcium: 10.1 mg/dL (ref 8.4–10.4)
Chloride: 105 mEq/L (ref 98–109)
Creatinine: 0.9 mg/dL (ref 0.6–1.1)
GLUCOSE: 104 mg/dL (ref 70–140)
POTASSIUM: 4.1 meq/L (ref 3.5–5.1)
Sodium: 142 mEq/L (ref 136–145)
Total Protein: 7.2 g/dL (ref 6.4–8.3)

## 2013-10-08 LAB — CBC WITH DIFFERENTIAL/PLATELET
BASO%: 1.4 % (ref 0.0–2.0)
Basophils Absolute: 0.1 10*3/uL (ref 0.0–0.1)
EOS ABS: 0 10*3/uL (ref 0.0–0.5)
EOS%: 0.2 % (ref 0.0–7.0)
HCT: 32.3 % — ABNORMAL LOW (ref 34.8–46.6)
HGB: 11.4 g/dL — ABNORMAL LOW (ref 11.6–15.9)
LYMPH%: 5.6 % — AB (ref 14.0–49.7)
MCH: 31.2 pg (ref 25.1–34.0)
MCHC: 35.1 g/dL (ref 31.5–36.0)
MCV: 89 fL (ref 79.5–101.0)
MONO#: 0.2 10*3/uL (ref 0.1–0.9)
MONO%: 3 % (ref 0.0–14.0)
NEUT%: 89.8 % — ABNORMAL HIGH (ref 38.4–76.8)
NEUTROS ABS: 5.3 10*3/uL (ref 1.5–6.5)
PLATELETS: 193 10*3/uL (ref 145–400)
RBC: 3.63 10*6/uL — ABNORMAL LOW (ref 3.70–5.45)
RDW: 18 % — ABNORMAL HIGH (ref 11.2–14.5)
WBC: 5.9 10*3/uL (ref 3.9–10.3)
lymph#: 0.3 10*3/uL — ABNORMAL LOW (ref 0.9–3.3)

## 2013-10-08 LAB — LACTATE DEHYDROGENASE (CC13): LDH: 277 U/L — ABNORMAL HIGH (ref 125–245)

## 2013-10-08 MED ORDER — ONDANSETRON 16 MG/50ML IVPB (CHCC)
16.0000 mg | Freq: Once | INTRAVENOUS | Status: AC
  Administered 2013-10-08: 16 mg via INTRAVENOUS

## 2013-10-08 MED ORDER — SODIUM CHLORIDE 0.9 % IV SOLN
600.0000 mg/m2 | Freq: Once | INTRAVENOUS | Status: AC
  Administered 2013-10-08: 1160 mg via INTRAVENOUS
  Filled 2013-10-08: qty 58

## 2013-10-08 MED ORDER — DIPHENHYDRAMINE HCL 25 MG PO CAPS
50.0000 mg | ORAL_CAPSULE | Freq: Once | ORAL | Status: AC
  Administered 2013-10-08: 50 mg via ORAL

## 2013-10-08 MED ORDER — ONDANSETRON 16 MG/50ML IVPB (CHCC)
INTRAVENOUS | Status: AC
  Filled 2013-10-08: qty 16

## 2013-10-08 MED ORDER — HEPARIN SOD (PORK) LOCK FLUSH 100 UNIT/ML IV SOLN
500.0000 [IU] | Freq: Once | INTRAVENOUS | Status: AC | PRN
  Administered 2013-10-08: 500 [IU]
  Filled 2013-10-08: qty 5

## 2013-10-08 MED ORDER — ACETAMINOPHEN 325 MG PO TABS
ORAL_TABLET | ORAL | Status: AC
  Filled 2013-10-08: qty 2

## 2013-10-08 MED ORDER — ACETAMINOPHEN 325 MG PO TABS
650.0000 mg | ORAL_TABLET | Freq: Once | ORAL | Status: AC
  Administered 2013-10-08: 650 mg via ORAL

## 2013-10-08 MED ORDER — SODIUM CHLORIDE 0.9 % IV SOLN
Freq: Once | INTRAVENOUS | Status: AC
  Administered 2013-10-08: 10:00:00 via INTRAVENOUS

## 2013-10-08 MED ORDER — DEXAMETHASONE SODIUM PHOSPHATE 20 MG/5ML IJ SOLN
20.0000 mg | Freq: Once | INTRAMUSCULAR | Status: AC
  Administered 2013-10-08: 20 mg via INTRAVENOUS

## 2013-10-08 MED ORDER — SODIUM CHLORIDE 0.9 % IJ SOLN
10.0000 mL | INTRAMUSCULAR | Status: DC | PRN
  Administered 2013-10-08: 10 mL
  Filled 2013-10-08: qty 10

## 2013-10-08 MED ORDER — VINCRISTINE SULFATE CHEMO INJECTION 1 MG/ML
1.0000 mg | Freq: Once | INTRAVENOUS | Status: AC
  Administered 2013-10-08: 1 mg via INTRAVENOUS
  Filled 2013-10-08: qty 1

## 2013-10-08 MED ORDER — DEXAMETHASONE SODIUM PHOSPHATE 20 MG/5ML IJ SOLN
INTRAMUSCULAR | Status: AC
  Filled 2013-10-08: qty 5

## 2013-10-08 MED ORDER — DIPHENHYDRAMINE HCL 25 MG PO CAPS
ORAL_CAPSULE | ORAL | Status: AC
  Filled 2013-10-08: qty 2

## 2013-10-08 MED ORDER — SODIUM CHLORIDE 0.9 % IV SOLN
375.0000 mg/m2 | Freq: Once | INTRAVENOUS | Status: AC
  Administered 2013-10-08: 700 mg via INTRAVENOUS
  Filled 2013-10-08: qty 70

## 2013-10-08 MED ORDER — DOXORUBICIN HCL CHEMO IV INJECTION 2 MG/ML
40.0000 mg/m2 | Freq: Once | INTRAVENOUS | Status: AC
  Administered 2013-10-08: 76 mg via INTRAVENOUS
  Filled 2013-10-08: qty 38

## 2013-10-08 NOTE — Patient Instructions (Signed)
[facility]charge Instructions for Patients Receiving Chemotherapy  Today you received the following chemotherapy agents Adriamycin/Vincristine/Cytoxan/Rituxan.  To help prevent nausea and vomiting after your treatment, we encourage you to take your nausea medication as prescribed.   If you develop nausea and vomiting that is not controlled by your nausea medication, call the clinic.   BELOW ARE SYMPTOMS THAT SHOULD BE REPORTED IMMEDIATELY:  *FEVER GREATER THAN 100.5 F  *CHILLS WITH OR WITHOUT FEVER  NAUSEA AND VOMITING THAT IS NOT CONTROLLED WITH YOUR NAUSEA MEDICATION  *UNUSUAL SHORTNESS OF BREATH  *UNUSUAL BRUISING OR BLEEDING  TENDERNESS IN MOUTH AND THROAT WITH OR WITHOUT PRESENCE OF ULCERS  *URINARY PROBLEMS  *BOWEL PROBLEMS  UNUSUAL RASH Items with * indicate a potential emergency and should be followed up as soon as possible.  Feel free to call the clinic you have any questions or concerns. The clinic phone number is [phone].    

## 2013-10-08 NOTE — Progress Notes (Signed)
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 Needle biopsy was inconclusive.     08/02/2011 Imaging CT scan of the chest abdomen and pelvis showed numerous lymphadenopathy above and below the diaphragm.    08/16/2011 Imaging MRI of the abdomen revealed enlarging left kidney lesion worrisome for malignancy   09/20/2011 Initial Diagnosis Lymphoma, low grade from kidney biopsy, follicular   2/42/3536 Procedure Histologic type: Non-Hodgkin's lymphoma, follicular center cell type. Grade (if applicable): Favor high grade, Ki-67 ranges from 10-50%. Immunohistochemical stains: CD20, CD79a, CD10, BCL-2, BCL-6, CD21, CD3, CD43.   05/09/2013 Procedure BM biopsy is highly suspicious of involvement. Patient had persistent leukopenia   05/09/2013 Imaging PET/CT showed significant involvement of LN everywhere   07/16/2013 -  Chemotherapy Start cycle 1 of RCHOP   07/23/2013 - 07/27/2013 Hospital Admission Admitted for management of mucositis and neutropenic fever. Blood culture was positive for gram negative rods, resolved with antibiotics     Follicular lymphoma (Resolved)    INTERVAL HISTORY: DAKARI STABLER 68 y.o. female returns for further followup. She has multiple concerns. She complained of a depression at the top of her head and she is concerned that might be malignant. It is not causing any pain. She complained of exacerbation of sciatica pain radiating from the right hip to her thigh that comes and goes. It is positional related. She noted some very mild constipation, resolved with laxatives. She also complained of mild worsening peripheral neuropathy. She denies any recent fever, chills, night sweats or abnormal weight loss The patient denies any mouth sores, nausea, vomiting or change in bowel habits   I have reviewed the past medical history, past surgical history, social history and family history with the patient and they are unchanged from previous note.  ALLERGIES:  is allergic to latex.  MEDICATIONS:  Current Outpatient Prescriptions  Medication Sig Dispense Refill  . acetaminophen (TYLENOL) 325 MG tablet Take 325 mg by mouth every 6 (six) hours as needed.      . Alum & Mag Hydroxide-Simeth (MAGIC MOUTHWASH W/LIDOCAINE) SOLN Take 5 mLs by mouth 4 (four) times daily.  240 mL  2  . aspirin 81 MG tablet Take 81 mg by mouth at bedtime.       . Calcium-Magnesium-Zinc (CAL-MAG-ZINC PO) Take 1 tablet by mouth daily.       . carboxymethylcellulose (REFRESH PLUS) 0.5 % SOLN Place 1 drop into both eyes daily as needed (DRY EYES).      . Cholecalciferol (VITAMIN D) 2000 UNITS tablet Take 2,000 Units by mouth daily.      . clobetasol cream (TEMOVATE) 0.05 % Apply  1 application topically daily. Apply to affected area      . docusate sodium (COLACE) 100 MG capsule Take 1 capsule (100 mg total) by mouth 2 (two) times daily.  100 capsule  1  . lansoprazole (PREVACID) 30 MG capsule Take 30 mg by mouth daily.       Marland Kitchen lidocaine-prilocaine (EMLA) cream Apply topically as needed.  30 g  0  . losartan-hydrochlorothiazide  (HYZAAR) 100-12.5 MG per tablet Take 1 tablet by mouth every evening.       . nystatin-triamcinolone ointment (MYCOLOG) Apply 1 application topically 2 (two) times daily.  30 g  0  . Omega-3 Fatty Acids (FISH OIL) 1200 MG CAPS Take 2 capsules by mouth daily.       . ondansetron (ZOFRAN) 8 MG tablet Take 8 mg by mouth every 8 (eight) hours as needed for nausea or vomiting.      . potassium chloride SA (K-DUR,KLOR-CON) 20 MEQ tablet Take 20 mEq by mouth daily.       . Probiotic Product (PROBIOTIC DAILY PO) Take 1 capsule by mouth daily.      . prochlorperazine (COMPAZINE) 10 MG tablet Take 1 tablet (10 mg total) by mouth every 6 (six) hours as needed (Nausea or vomiting).  30 tablet  6  . pyridOXINE (VITAMIN B-6) 100 MG tablet Take 100 mg by mouth daily.       . Red Yeast Rice 600 MG CAPS Take 1 capsule by mouth every evening.       . vitamin C (ASCORBIC ACID) 500 MG tablet Take 500 mg by mouth daily.       No current facility-administered medications for this visit.   Facility-Administered Medications Ordered in Other Visits  Medication Dose Route Frequency Provider Last Rate Last Dose  . sodium chloride 0.9 % injection 10 mL  10 mL Intracatheter PRN Heath Lark, MD   10 mL at 10/08/13 1318    REVIEW OF SYSTEMS:   Constitutional: Denies fevers, chills or abnormal weight loss Eyes: Denies blurriness of vision Ears, nose, mouth, throat, and face: Denies mucositis or sore throat Respiratory: Denies cough, dyspnea or wheezes Cardiovascular: Denies palpitation, chest discomfort or lower extremity swelling Gastrointestinal:  Denies nausea, heartburn or change in bowel habits Skin: Denies abnormal skin rashes Lymphatics: Denies new lymphadenopathy or easy bruising Behavioral/Psych: Mood is stable, no new changes  All other systems were reviewed with the patient and are negative.  PHYSICAL EXAMINATION: ECOG PERFORMANCE STATUS: 1 - Symptomatic but completely ambulatory  Filed Vitals:    10/08/13 0851  BP: 144/70  Pulse: 77  Temp: 97.7 F (36.5 C)  Resp: 18   Filed Weights   10/08/13 0851  Weight: 180 lb 4.8 oz (81.784 kg)    GENERAL:alert, no distress and comfortable SKIN: skin color, texture, turgor are normal, no rashes or significant lesions EYES: normal, Conjunctiva are pink and non-injected, sclera clear OROPHARYNX:no exudate, no erythema and lips, buccal mucosa, and tongue normal  NECK: supple, thyroid normal size, non-tender, without nodularity LYMPH:  no palpable lymphadenopathy in the cervical, axillary or inguinal LUNGS: clear to auscultation and percussion with normal breathing effort HEART: regular rate & rhythm and no murmurs and no lower extremity edema ABDOMEN:abdomen soft, non-tender and normal bowel sounds Musculoskeletal:no cyanosis of digits and no clubbing  NEURO: alert & oriented x 3 with fluent speech, no focal motor/sensory deficits Head examination revealed a small depression at the top of her skull likely benign in nature. She has total alopecia  LABORATORY DATA:  I have reviewed the data as listed    Component Value Date/Time   NA 142 10/08/2013 0828   NA 140 07/27/2013 0550   K 4.1 10/08/2013 0828   K 3.6 07/27/2013 0550   CL 107 07/27/2013 0550   CO2 25 10/08/2013 0828   CO2 25 07/27/2013 0550   GLUCOSE 104 10/08/2013 0828   GLUCOSE 101* 07/27/2013 0550   BUN 9.7 10/08/2013 0828   BUN 12 07/27/2013 0550   CREATININE 0.9 10/08/2013 0828   CREATININE 0.84 07/27/2013 0550   CALCIUM 10.1 10/08/2013 0828   CALCIUM 8.6 07/27/2013 0550   PROT 7.2 10/08/2013 0828   PROT 6.8 07/24/2013 0550   ALBUMIN 4.5 10/08/2013 0828   ALBUMIN 3.4* 07/24/2013 0550   AST 26 10/08/2013 0828   AST 19 07/24/2013 0550   ALT 23 10/08/2013 0828   ALT 30 07/24/2013 0550   ALKPHOS 73 10/08/2013 0828   ALKPHOS 70 07/24/2013 0550   BILITOT 0.75 10/08/2013 0828   BILITOT 1.1 07/24/2013 0550   GFRNONAA 70* 07/27/2013 0550   GFRAA 82* 07/27/2013 0550    No  results found for this basename: SPEP,  UPEP,   kappa and lambda light chains    Lab Results  Component Value Date   WBC 5.9 10/08/2013   NEUTROABS 5.3 10/08/2013   HGB 11.4* 10/08/2013   HCT 32.3* 10/08/2013   MCV 89.0 10/08/2013   PLT 193 10/08/2013      Chemistry      Component Value Date/Time   NA 142 10/08/2013 0828   NA 140 07/27/2013 0550   K 4.1 10/08/2013 0828   K 3.6 07/27/2013 0550   CL 107 07/27/2013 0550   CO2 25 10/08/2013 0828   CO2 25 07/27/2013 0550   BUN 9.7 10/08/2013 0828   BUN 12 07/27/2013 0550   CREATININE 0.9 10/08/2013 0828   CREATININE 0.84 07/27/2013 0550      Component Value Date/Time   CALCIUM 10.1 10/08/2013 0828   CALCIUM 8.6 07/27/2013 0550   ALKPHOS 73 10/08/2013 0828   ALKPHOS 70 07/24/2013 0550   AST 26 10/08/2013 0828   AST 19 07/24/2013 0550   ALT 23 10/08/2013 0828   ALT 30 07/24/2013 0550   BILITOT 0.75 10/08/2013 0828   BILITOT 1.1 07/24/2013 0550     ASSESSMENT & PLAN:  #1 follicular lymphoma She developed complication with neutropenic fever, despite Neulasta support with cycle 1 of treatment. With her chemotherapy dosage adjustment by 20% from cycle 2 onwards, she did well. We will continue Neulasta support with each cycle. I will continue treatment today without delay #2 mild anemia This is likely due to recent treatment. The patient denies recent history of bleeding such as epistaxis, hematuria or hematochezia. She is asymptomatic from the anemia. I will observe for now.  She does not require transfusion now. I will continue the chemotherapy at current dose without dosage adjustment.  If the anemia gets progressive worse in the future, I might have to delay her treatment or adjust the chemotherapy dose. #3 mild peripheral neuropathy and constipation This is due to recent chemotherapy. 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 Hopefully by reducing the dose of vincristine it may improve exacerbation of her sciatica pain.  Orders Placed This Encounter  Procedures  .  DG Skull Complete    Standing Status: Future     Number of Occurrences: 1     Standing Expiration Date: 12/07/2014    Scheduling Instructions:     Do it around 3 pm today after chemo    Order Specific Question:  Reason for Exam (SYMPTOM  OR DIAGNOSIS REQUIRED)    Answer:  skull depression, ?lesion    Order Specific Question:  Preferred imaging location?    Answer:  [facility]   All questions were answered. The patient knows to call the clinic with any problems, questions or concerns. No barriers to learning was detected.   [facility], [place], MD 10/08/2013 1:57 PM

## 2013-10-08 NOTE — Telephone Encounter (Signed)
gv and printeda ppt sched and avs for pt for Jan and Feb. °

## 2013-10-09 ENCOUNTER — Telehealth: Payer: Self-pay | Admitting: *Deleted

## 2013-10-09 ENCOUNTER — Ambulatory Visit (HOSPITAL_BASED_OUTPATIENT_CLINIC_OR_DEPARTMENT_OTHER): Payer: Medicare Other

## 2013-10-09 VITALS — BP 141/73 | HR 75 | Temp 98.1°F

## 2013-10-09 DIAGNOSIS — C8239 Follicular lymphoma grade IIIa, extranodal and solid organ sites: Secondary | ICD-10-CM

## 2013-10-09 DIAGNOSIS — Z5189 Encounter for other specified aftercare: Secondary | ICD-10-CM

## 2013-10-09 DIAGNOSIS — C8299 Follicular lymphoma, unspecified, extranodal and solid organ sites: Secondary | ICD-10-CM | POA: Diagnosis not present

## 2013-10-09 MED ORDER — PEGFILGRASTIM INJECTION 6 MG/0.6ML
6.0000 mg | Freq: Once | SUBCUTANEOUS | Status: AC
  Administered 2013-10-09: 6 mg via SUBCUTANEOUS
  Filled 2013-10-09: qty 0.6

## 2013-10-09 NOTE — Patient Instructions (Signed)

## 2013-10-09 NOTE — Telephone Encounter (Signed)
Pt already called with results of xray.

## 2013-10-09 NOTE — Telephone Encounter (Signed)
Message copied by Cathlean Cower on Thu Oct 09, 2013 11:43 AM ------      Message from: [facility], [place]: Wed Oct 08, 2013  4:42 PM      Regarding: normal Xray       Please let her know it is normal      ----- Message -----         From: Rad Results In Interface         Sent: 10/08/2013   3:51 PM           To: Heath Lark, MD                   ------

## 2013-10-09 NOTE — Telephone Encounter (Signed)
Informed pt of skull xray negative, results wnl.  She verbalized understanding.

## 2013-10-14 ENCOUNTER — Telehealth: Payer: Self-pay | Admitting: *Deleted

## 2013-10-14 ENCOUNTER — Observation Stay (HOSPITAL_COMMUNITY): Payer: Medicare Other

## 2013-10-14 ENCOUNTER — Observation Stay (HOSPITAL_COMMUNITY)
Admission: AD | Admit: 2013-10-14 | Discharge: 2013-10-15 | Disposition: A | Discharge status: Home / Self Care | Payer: Medicare Other | Source: Ambulatory Visit | Attending: Hematology and Oncology | Admitting: Hematology and Oncology

## 2013-10-14 ENCOUNTER — Encounter (HOSPITAL_COMMUNITY): Payer: Self-pay

## 2013-10-14 ENCOUNTER — Encounter: Payer: Medicare Other | Admitting: Hematology and Oncology

## 2013-10-14 ENCOUNTER — Other Ambulatory Visit: Payer: Self-pay | Admitting: Hematology and Oncology

## 2013-10-14 DIAGNOSIS — R296 Repeated falls: Secondary | ICD-10-CM | POA: Insufficient documentation

## 2013-10-14 DIAGNOSIS — I10 Essential (primary) hypertension: Secondary | ICD-10-CM | POA: Diagnosis not present

## 2013-10-14 DIAGNOSIS — I2510 Atherosclerotic heart disease of native coronary artery without angina pectoris: Secondary | ICD-10-CM | POA: Diagnosis not present

## 2013-10-14 DIAGNOSIS — D61818 Other pancytopenia: Secondary | ICD-10-CM | POA: Diagnosis not present

## 2013-10-14 DIAGNOSIS — S01501A Unspecified open wound of lip, initial encounter: Secondary | ICD-10-CM | POA: Diagnosis not present

## 2013-10-14 DIAGNOSIS — S01511A Laceration without foreign body of lip, initial encounter: Secondary | ICD-10-CM

## 2013-10-14 DIAGNOSIS — E785 Hyperlipidemia, unspecified: Secondary | ICD-10-CM | POA: Diagnosis not present

## 2013-10-14 DIAGNOSIS — S025XXA Fracture of tooth (traumatic), initial encounter for closed fracture: Secondary | ICD-10-CM

## 2013-10-14 DIAGNOSIS — K219 Gastro-esophageal reflux disease without esophagitis: Secondary | ICD-10-CM | POA: Diagnosis not present

## 2013-10-14 DIAGNOSIS — R55 Syncope and collapse: Secondary | ICD-10-CM

## 2013-10-14 DIAGNOSIS — S01502A Unspecified open wound of oral cavity, initial encounter: Secondary | ICD-10-CM | POA: Diagnosis not present

## 2013-10-14 DIAGNOSIS — C8299 Follicular lymphoma, unspecified, extranodal and solid organ sites: Secondary | ICD-10-CM | POA: Diagnosis not present

## 2013-10-14 DIAGNOSIS — W19XXXA Unspecified fall, initial encounter: Secondary | ICD-10-CM

## 2013-10-14 DIAGNOSIS — Z9221 Personal history of antineoplastic chemotherapy: Secondary | ICD-10-CM | POA: Diagnosis not present

## 2013-10-14 DIAGNOSIS — K0381 Cracked tooth: Secondary | ICD-10-CM | POA: Diagnosis not present

## 2013-10-14 DIAGNOSIS — Y9229 Other specified public building as the place of occurrence of the external cause: Secondary | ICD-10-CM

## 2013-10-14 DIAGNOSIS — I959 Hypotension, unspecified: Secondary | ICD-10-CM | POA: Diagnosis not present

## 2013-10-14 HISTORY — DX: Syncope and collapse: R55

## 2013-10-14 LAB — URINALYSIS, ROUTINE W REFLEX MICROSCOPIC
Bilirubin Urine: NEGATIVE
Glucose, UA: NEGATIVE mg/dL
Hgb urine dipstick: NEGATIVE
Ketones, ur: NEGATIVE mg/dL
NITRITE: NEGATIVE
PH: 6 (ref 5.0–8.0)
PROTEIN: NEGATIVE mg/dL
Specific Gravity, Urine: 1.019 (ref 1.005–1.030)
Urobilinogen, UA: 1 mg/dL (ref 0.0–1.0)

## 2013-10-14 LAB — COMPREHENSIVE METABOLIC PANEL
ALK PHOS: 94 U/L (ref 39–117)
ALT: 16 U/L (ref 0–35)
AST: 14 U/L (ref 0–37)
Albumin: 3.7 g/dL (ref 3.5–5.2)
BILIRUBIN TOTAL: 0.5 mg/dL (ref 0.3–1.2)
BUN: 26 mg/dL — AB (ref 6–23)
CO2: 27 meq/L (ref 19–32)
Calcium: 9 mg/dL (ref 8.4–10.5)
Chloride: 101 mEq/L (ref 96–112)
Creatinine, Ser: 0.91 mg/dL (ref 0.50–1.10)
GFR calc non Af Amer: 63 mL/min — ABNORMAL LOW (ref >90)
GFR, EST AFRICAN AMERICAN: 73 mL/min — AB (ref >90)
GLUCOSE: 99 mg/dL (ref 70–99)
POTASSIUM: 3.5 meq/L — AB (ref 3.7–5.3)
Sodium: 140 mEq/L (ref 137–147)
Total Protein: 5.9 g/dL — ABNORMAL LOW (ref 6.0–8.3)

## 2013-10-14 LAB — CBC WITH DIFFERENTIAL/PLATELET
BASOS PCT: 0 % (ref 0–1)
Basophils Absolute: 0 10*3/uL (ref 0.0–0.1)
EOS PCT: 1 % (ref 0–5)
Eosinophils Absolute: 0 10*3/uL (ref 0.0–0.7)
HEMATOCRIT: 29.7 % — AB (ref 36.0–46.0)
HEMOGLOBIN: 10.3 g/dL — AB (ref 12.0–15.0)
LYMPHS ABS: 0.2 10*3/uL — AB (ref 0.7–4.0)
Lymphocytes Relative: 7 % — ABNORMAL LOW (ref 12–46)
MCH: 30.7 pg (ref 26.0–34.0)
MCHC: 34.7 g/dL (ref 30.0–36.0)
MCV: 88.4 fL (ref 78.0–100.0)
MONOS PCT: 4 % (ref 3–12)
Monocytes Absolute: 0.1 10*3/uL (ref 0.1–1.0)
NEUTROS ABS: 2.5 10*3/uL (ref 1.7–7.7)
Neutrophils Relative %: 88 % — ABNORMAL HIGH (ref 43–77)
Platelets: 95 10*3/uL — ABNORMAL LOW (ref 150–400)
RBC: 3.36 MIL/uL — AB (ref 3.87–5.11)
RDW: 15.4 % (ref 11.5–15.5)
WBC: 2.8 10*3/uL — AB (ref 4.0–10.5)

## 2013-10-14 LAB — TROPONIN I

## 2013-10-14 LAB — URINE MICROSCOPIC-ADD ON

## 2013-10-14 MED ORDER — ALUM & MAG HYDROXIDE-SIMETH 200-200-20 MG/5ML PO SUSP: 60.0000 mL | ORAL | Status: DC | PRN

## 2013-10-14 MED ORDER — ONDANSETRON HCL 4 MG/2ML IJ SOLN: 4.0000 mg | Freq: Three times a day (TID) | INTRAMUSCULAR | Status: DC | PRN

## 2013-10-14 MED ORDER — ONDANSETRON HCL 4 MG PO TABS: 4.0000 mg | ORAL_TABLET | Freq: Three times a day (TID) | ORAL | Status: DC | PRN

## 2013-10-14 MED ORDER — ASPIRIN 81 MG PO CHEW
81.0000 mg | CHEWABLE_TABLET | Freq: Every day | ORAL | Status: DC
  Administered 2013-10-14 – 2013-10-15 (×2): 81 mg via ORAL
  Filled 2013-10-14 (×2): qty 1

## 2013-10-14 MED ORDER — PANTOPRAZOLE SODIUM 40 MG PO TBEC
40.0000 mg | DELAYED_RELEASE_TABLET | Freq: Every day | ORAL | Status: DC
  Administered 2013-10-14 – 2013-10-15 (×2): 40 mg via ORAL
  Filled 2013-10-14 (×2): qty 1

## 2013-10-14 MED ORDER — ENOXAPARIN SODIUM 40 MG/0.4ML ~~LOC~~ SOLN
40.0000 mg | SUBCUTANEOUS | Status: DC
  Administered 2013-10-14: 40 mg via SUBCUTANEOUS
  Filled 2013-10-14 (×2): qty 0.4

## 2013-10-14 MED ORDER — MAGIC MOUTHWASH W/LIDOCAINE
5.0000 mL | Freq: Four times a day (QID) | ORAL | Status: DC
  Administered 2013-10-14 – 2013-10-15 (×5): 5 mL via ORAL
  Filled 2013-10-14 (×6): qty 5

## 2013-10-14 MED ORDER — SODIUM CHLORIDE 0.9 % IV SOLN
INTRAVENOUS | Status: DC
  Administered 2013-10-14: 100 mL/h via INTRAVENOUS
  Administered 2013-10-15 (×2): via INTRAVENOUS

## 2013-10-14 MED ORDER — SENNOSIDES-DOCUSATE SODIUM 8.6-50 MG PO TABS: 1.0000 | ORAL_TABLET | Freq: Two times a day (BID) | ORAL | Status: DC | PRN

## 2013-10-14 MED ORDER — DOCUSATE SODIUM 100 MG PO CAPS
100.0000 mg | ORAL_CAPSULE | Freq: Two times a day (BID) | ORAL | Status: DC
  Administered 2013-10-14: 100 mg via ORAL
  Filled 2013-10-14 (×3): qty 1

## 2013-10-14 MED ORDER — ACETAMINOPHEN 325 MG PO TABS
650.0000 mg | ORAL_TABLET | ORAL | Status: DC | PRN
  Filled 2013-10-14: qty 2

## 2013-10-14 MED ORDER — ONDANSETRON 4 MG PO TBDP
4.0000 mg | ORAL_TABLET | Freq: Three times a day (TID) | ORAL | Status: DC | PRN
  Filled 2013-10-14: qty 1

## 2013-10-14 MED ORDER — LIDOCAINE-PRILOCAINE 2.5-2.5 % EX CREA
TOPICAL_CREAM | Freq: Once | CUTANEOUS | Status: DC
Start: 2013-10-14 — End: 2013-10-15
  Filled 2013-10-14: qty 5

## 2013-10-14 NOTE — H&P (Signed)
[facility] Admission note  Patient Care Team: [name], MD as PCP - General (Family Medicine) Heath Lark, MD as Consulting Physician (Hematology and Oncology)  CHIEF COMPLAINTS/PURPOSE OF ADMISSION:  Syncopal episode, lacerated lip and hypotension  HISTORY OF PRESENTING ILLNESS:  [name] 68 y.o. female is here because of recent syncopal episode. The patient has just completed chemotherapy recently. Her husband called. The patient recalled going to the bathroom. After she strained for bowel movement, she passed out. Her husband thought she might have passed out for approximately 5-10 minutes. She denies any post ictal confusion.  Denies any chest pain, shortness of breath or lightheadedness. She denies any bowel or bladder incontinence With a fall, she lacerated her lip and sustained a broken tooth. It bled for long time. The patient refused to go to the emergency department this morning. I saw her in the clinic and admitted her directly for observation and further workup for the syncopal episode. MEDICAL HISTORY:  Past Medical History  Diagnosis Date  . CAD (coronary artery disease)   . GERD (gastroesophageal reflux disease)   . HTN (hypertension)   . Hyperlipidemia   . Enlargement of lymph nodes 07/26/2011  . 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 Lymphoma   . Tick bite of knee   . Thyroid disease   . Follicular lymphoma 32/54/9826  . 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  B-cell, favor follicular NHL.  CD79a, CD20 and CD10 positive.  Cyclin D1 negative.   . Mouth sores 07/18/2013  . Fever 07/23/2013  . Fungal infection 08/01/2013  . Hemorrhoids 08/05/2013  . Leukopenia 09/16/2013  . 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 Skull deformity 10/08/2013    SURGICAL HISTORY: Past Surgical History  Procedure Laterality Date  . Mr  breast bilateral  cyst removal  . Abdominal hysterectomy    . Lymph node dissection  x 3 different times    behind left ear 2012/ left clavical area  . Tubal ligation    . Knee arthroscopy w/ debridement  05/2011    right  . Bone marrow biopsy  05/09/2013  . Bone marrow aspiration  05/09/2013    SOCIAL HISTORY: History   Social History  . Marital Status: Married    Spouse Name: N/A    Number of Children: N/A  . Years of Education: N/A   Occupational History  . [employer]    Social History Main Topics  . Smoking status: Never Smoker   . Smokeless tobacco: Never Used  . Alcohol Use: No  . Drug Use: No  . Sexual Activity: No   Other Topics Concern  . Not on file   Social History Narrative  . No narrative on file    FAMILY HISTORY: Family History  Problem Relation Age of Onset  . Cancer    . Stroke    . Heart disease    . Coronary artery disease    . Cancer Mother     breast  . Stroke Mother   . Cancer Father     pancreatic cancer  . Cancer Brother     kidney cancer/brain tumor    ALLERGIES:  is allergic to latex.  MEDICATIONS:  Current Facility-Administered Medications  Medication Dose Route Frequency Provider Last Rate Last Dose  . 0.9 %  sodium chloride infusion   Intravenous Continuous Teckla Christiansen,  MD      . acetaminophen (TYLENOL) tablet 650 mg  650 mg Oral Q4H PRN Heath Lark, MD      . alum & mag hydroxide-simeth (MAALOX/MYLANTA) 200-200-20 MG/5ML suspension 60 mL  60 mL Oral Q4H PRN Heath Lark, MD      . aspirin chewable tablet 81 mg  81 mg Oral Daily Laverta Harnisch, MD      . docusate sodium (COLACE) capsule 100 mg  100 mg Oral BID Heath Lark, MD      . enoxaparin (LOVENOX) injection 40 mg  40 mg Subcutaneous Q24H Brenn Gatton, MD      . lidocaine-prilocaine (EMLA) cream   Topical Once Heath Lark, MD      . magic mouthwash w/lidocaine  5 mL Oral QID Heath Lark, MD      . ondansetron (ZOFRAN) tablet 4-8 mg  4-8 mg Oral Q8H PRN Heath Lark, MD       Or  .  ondansetron (ZOFRAN-ODT) disintegrating tablet 4 mg  4 mg Oral Q8H PRN Heath Lark, MD       Or  . ondansetron (ZOFRAN) injection 4 mg  4 mg Intravenous Q8H PRN Heath Lark, MD       Or  . ondansetron (ZOFRAN) injection 4 mg  4 mg Intravenous Q8H PRN Carmon Brigandi, MD      . pantoprazole (PROTONIX) EC tablet 40 mg  40 mg Oral Daily Heath Lark, MD      . senna-docusate (Senokot-S) tablet 1 tablet  1 tablet Oral BID PRN Heath Lark, MD        REVIEW OF SYSTEMS:   Constitutional: Denies fevers, chills or abnormal night sweats Eyes: Denies blurriness of vision, double vision or watery eyes Ears, nose, mouth, throat, and face: Denies mucositis or sore throat Respiratory: Denies cough, dyspnea or wheezes Cardiovascular: Denies palpitation, chest discomfort or lower extremity swelling Gastrointestinal:  Denies nausea, heartburn or change in bowel habits Skin: Denies abnormal skin rashes Lymphatics: Denies new lymphadenopathy or easy bruising Neurological:Denies numbness, tingling or new weaknesses Behavioral/Psych: Mood is stable, no new changes  All other systems were reviewed with the patient and are negative.  PHYSICAL EXAMINATION: ECOG PERFORMANCE STATUS: 1 - Symptomatic but completely ambulatory BP 93/49 HR 80 RR 20  T 96.9 GENERAL:alert, no distress and comfortable SKIN: skin color, texture, turgor are normal, no rashes or significant lesions EYES: normal, conjunctiva are pink and non-injected, sclera clear OROPHARYNX: Noted she had a laceration on her lower lip which has stopped bleeding, no mucositis or thrush NECK: supple, thyroid normal size, non-tender, without nodularity LYMPH:  no palpable lymphadenopathy in the cervical, axillary or inguinal LUNGS: clear to auscultation and percussion with normal breathing effort HEART: regular rate & rhythm and no murmurs and no lower extremity edema ABDOMEN:abdomen soft, non-tender and normal bowel sounds Musculoskeletal:no cyanosis of digits  and no clubbing  PSYCH: alert & oriented x 3 with fluent speech NEURO: no focal motor/sensory deficits  LABORATORY DATA:  I have reviewed the data as listed Lab Results  Component Value Date   WBC 5.9 10/08/2013   HGB 11.4* 10/08/2013   HCT 32.3* 10/08/2013   MCV 89.0 10/08/2013   PLT 193 10/08/2013   Lab Results  Component Value Date   NA 142 10/08/2013   K 4.1 10/08/2013   CL 107 07/27/2013   CO2 25 10/08/2013   ASSESSMENT&PLAN:  #1 syncopal episode This is likely vasovagal as she strained for bowel movement prior to this happening. I  will put her on observation with observation and EKG. She received chemotherapy recently that could cause risk of congestive heart failure. Clinically she does not have signs and symptoms of congestive heart failure. I will observe for now. I will order a CT scan of the head to rule out stroke #2 lacerated lip I will consult Plastic surgery for evaluation #3 broken tooth This is trauma related. I will consult dentistry #4 hypotension I have discontinued her blood pressure medication. I will give her IV fluids for resuscitation. I will order a urinalysis, blood culture and chest x-ray to rule out early infection. #5 DVT prophylaxis I will put on Lovenox  Estimated length of stay probably 24-48 hours. Orders Placed This Encounter  Procedures  . Culture, blood (single)    Standing Status: Standing     Number of Occurrences: 1     Standing Expiration Date:   . X-ray chest PA and lateral     Standing Status: Standing     Number of Occurrences: 1     Standing Expiration Date:     Order Specific Question:  Reason for exam:    Answer:  hypotension, r/o sepsis  . CT Head Wo Contrast    Standing Status: Standing     Number of Occurrences: 1     Standing Expiration Date:     Order Specific Question:  Reason for exam:    Answer:  syncope episode, r/o stroke  . CBC WITH DIFFERENTIAL    Standing Status: Standing     Number of Occurrences: 1      Standing Expiration Date:   . Comprehensive metabolic panel    Standing Status: Standing     Number of Occurrences: 1     Standing Expiration Date:   . Troponin I    Standing Status: Standing     Number of Occurrences: 1     Standing Expiration Date:   . TSH    Standing Status: Standing     Number of Occurrences: 1     Standing Expiration Date:   . Urinalysis, Routine w reflex microscopic    Standing Status: Standing     Number of Occurrences: 1     Standing Expiration Date:   . Diet regular    Standing Status: Standing     Number of Occurrences: 1     Standing Expiration Date:     Order Specific Question:  Room service appropriate?    Answer:  Yes    Order Specific Question:  Fluid consistency:    Answer:  Thin  . Neuro checks    Standing Status: Standing     Number of Occurrences: 12     Standing Expiration Date:   . Notify physician    Notify physician for pulse less than 55 or greater than 120, respiratory rate less than 12 or greater than 25, temperature greater than 100.5 F, urinary output less than 30 mL/kg/hr for four hours, systolic BP less than 90 or greater than 830, diastolic BP less than 60 or greater than 100.    Standing Status: Standing     Number of Occurrences: 20     Standing Expiration Date:   . Oncology -  Do Not continue treatment plan    Standing Status: Standing     Number of Occurrences: 1     Standing Expiration Date:   . Up with assistance    Standing Status: Standing     Number of Occurrences: [phone]  Standing Expiration Date:   Marland Kitchen Vital signs    Standing Status: Standing     Number of Occurrences: 1     Standing Expiration Date:   . Place TED hose    Standing Status: Standing     Number of Occurrences: 1     Standing Expiration Date:     Order Specific Question:  Laterality    Answer:  Bilateral  . Full code    Standing Status: Standing     Number of Occurrences: 1     Standing Expiration Date:   . chemotherapy pharmacy monitoring     Standing Status: Standing     Number of Occurrences: 1     Standing Expiration Date:   . Consult to dentist    Standing Status: Standing     Number of Occurrences: 1     Standing Expiration Date:     Order Specific Question:  Reason for Consult?    Answer:  broken tooth from a fall  . Consult to plastic surgery    Standing Status: Standing     Number of Occurrences: 1     Standing Expiration Date:     Order Specific Question:  Reason for Consult?    Answer:  lip laceration from syncope  . EKG 12-Lead    For chest pain    Standing Status: Standing     Number of Occurrences: 1     Standing Expiration Date:     Order Specific Question:  Reason for Exam    Answer:  syncope  . [employer] A Cath    Standing Status: Standing     Number of Occurrences: 20     Standing Expiration Date:   . Place in observation (patient's expected length of stay will be less than 2 midnights)    Standing Status: Standing     Number of Occurrences: 1     Standing Expiration Date:     Order Specific Question:  Hospital Area    Answer:  [facility] [[id number]]    Order Specific Question:  Diagnosis    Answer:  Syncope [383291]    Order Specific Question:  Level of Care    Answer:  Med-Surg [16]    Order Specific Question:  Admitting Physician    AnswerAlvy Bimler, BT [[id number]]    Order Specific Question:  Attending Physician    AnswerAlvy Bimler, HT [[id number]]    Order Specific Question:  PT Class (Do Not Modify)    Answer:  Observation [104]    Order Specific Question:  PT Acc Code (Do Not Modify)    Answer:  Observation [10022]    All questions were answered. The patient knows to call the clinic with any problems, questions or concerns.    Melenda Bielak, MD @T @ 3:33 PM

## 2013-10-14 NOTE — Telephone Encounter (Signed)
Instructed husband to bring pt into clinic to see Dr. Alvy Bimler as soon as they can get here.  He verbalized understanding.

## 2013-10-14 NOTE — Progress Notes (Signed)
Please see admission note. The patient is admitted to the hospital. This encounter was created in error - please disregard.

## 2013-10-14 NOTE — Telephone Encounter (Signed)
Pt reports her throat is "irritated" this morning.  Denies pain and denies any mouth sores or fevers.. Discussed this is likely side effect from chemotherapy,.  Instructed pt to gargle w/ warm salt water 4 times daily and monitor her temp twice daily.  Call us back if her throat feels worse, develops any fevers or mouth soreness.   Pt verbalized understanding.

## 2013-10-14 NOTE — Telephone Encounter (Signed)
Husband called states pt passed out while on commode.  She "busted her lip" but it is ok.  States pt didn't remember passing out and is ok in bed right now.  He thought pt should go to ED,  But pt does not want to go to ED.

## 2013-10-15 DIAGNOSIS — K0381 Cracked tooth: Secondary | ICD-10-CM | POA: Diagnosis not present

## 2013-10-15 DIAGNOSIS — D61818 Other pancytopenia: Secondary | ICD-10-CM

## 2013-10-15 DIAGNOSIS — R55 Syncope and collapse: Secondary | ICD-10-CM | POA: Diagnosis not present

## 2013-10-15 DIAGNOSIS — S01501A Unspecified open wound of lip, initial encounter: Secondary | ICD-10-CM | POA: Diagnosis not present

## 2013-10-15 DIAGNOSIS — C8299 Follicular lymphoma, unspecified, extranodal and solid organ sites: Secondary | ICD-10-CM

## 2013-10-15 DIAGNOSIS — I959 Hypotension, unspecified: Secondary | ICD-10-CM | POA: Diagnosis not present

## 2013-10-15 LAB — TSH: TSH: 1.615 u[IU]/mL (ref 0.350–4.500)

## 2013-10-15 MED ORDER — LIDOCAINE-EPINEPHRINE (PF) 1 %-1:200000 IJ SOLN
20.0000 mL | Freq: Once | INTRAMUSCULAR | Status: DC
  Filled 2013-10-15: qty 20

## 2013-10-15 MED ORDER — HEPARIN SOD (PORK) LOCK FLUSH 100 UNIT/ML IV SOLN
500.0000 [IU] | INTRAVENOUS | Status: AC | PRN
  Administered 2013-10-15: 500 [IU]

## 2013-10-15 MED ORDER — SODIUM CHLORIDE 0.9 % IJ SOLN
10.0000 mL | INTRAMUSCULAR | Status: DC | PRN
  Administered 2013-10-15: 10 mL

## 2013-10-15 NOTE — Discharge Summary (Signed)
Physician Discharge Summary  Patient ID: [name] MRN: [id number] DOB/AGE: 04-04-1946 68 y.o.  Admit date: 10/14/2013 Discharge date: 10/15/2013  Primary Care Physician:  Maggie Font, MD   Discharge Diagnoses:    Present on Admission:  . Syncope Pancytopenia Hypotension Lip laceration Broken tooth from trauma Discharge Medications:    Medication List    STOP taking these medications       losartan-hydrochlorothiazide 100-12.5 MG per tablet  Commonly known as:  HYZAAR     nystatin-triamcinolone ointment  Commonly known as:  MYCOLOG      TAKE these medications       acetaminophen 325 MG tablet  Commonly known as:  TYLENOL  Take 325 mg by mouth every 6 (six) hours as needed.     aspirin 81 MG tablet  Take 81 mg by mouth at bedtime.     CAL-MAG-ZINC PO  Take 1 tablet by mouth daily.     clobetasol cream 0.05 %  Commonly known as:  TEMOVATE  Apply 1 application topically daily. Apply to affected area     docusate sodium 100 MG capsule  Commonly known as:  COLACE  Take 1 capsule (100 mg total) by mouth 2 (two) times daily.     Fish Oil 1200 MG Caps  Take 2 capsules by mouth daily.     lansoprazole 30 MG capsule  Commonly known as:  PREVACID  Take 30 mg by mouth daily.     lidocaine-prilocaine cream  Commonly known as:  EMLA  Apply topically as needed.     magic mouthwash w/lidocaine Soln  Take 5 mLs by mouth 4 (four) times daily.     ondansetron 8 MG tablet  Commonly known as:  ZOFRAN  Take 8 mg by mouth every 8 (eight) hours as needed for nausea or vomiting.     potassium chloride SA 20 MEQ tablet  Commonly known as:  K-DUR,KLOR-CON  Take 20 mEq by mouth daily.     PROBIOTIC DAILY PO  Take 1 capsule by mouth daily.     prochlorperazine 10 MG tablet  Commonly known as:  COMPAZINE  Take 1 tablet (10 mg total) by mouth every 6 (six) hours as needed (Nausea or vomiting).     pyridOXINE 100 MG tablet  Commonly known as:  VITAMIN  B-6  Take 100 mg by mouth daily.     Red Yeast Rice 600 MG Caps  Take 1 capsule by mouth every evening.     vitamin C 500 MG tablet  Commonly known as:  ASCORBIC ACID  Take 500 mg by mouth daily.     Vitamin D 2000 UNITS tablet  Take 2,000 Units by mouth daily.         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 Assess for lytic lesion. Lymphoma.  EXAM: SKULL - COMPLETE 4 + VIEW  COMPARISON:  Head CT 06/15/2012.  FINDINGS: No lytic calvarial lesion. No explanation for the physical finding. The examination is normal.  IMPRESSION: No lytic lesion.   Electronically Signed   By: Nelson Chimes M.D.   On: 10/08/2013 15:48   X-ray Chest Pa And Lateral   10/14/2013   CLINICAL DATA:  Syncope  EXAM: CHEST  2 VIEW  COMPARISON:  DG CHEST 2 VIEW dated 07/23/2013  FINDINGS: Low lung volumes. Stable Port-A-Cath. Normal heart size. Lungs are grossly clear. No pneumothorax.  IMPRESSION:  No active cardiopulmonary disease.   Electronically Signed   By: Maryclare Bean M.D.   On: 10/14/2013 16:11   Ct Head Wo Contrast  10/14/2013   CLINICAL DATA:  Syncopal episode today.  Rule out stroke.  EXAM: CT HEAD WITHOUT CONTRAST  TECHNIQUE: Contiguous axial images were obtained from the base of the skull through the vertex without intravenous contrast.  COMPARISON:  06/15/2012  FINDINGS: There is no evidence of acute infarct, mass, midline shift, intracranial hemorrhage, or extra-axial fluid collection. Ventricles and sulci are within normal limits for age. Orbits are unremarkable. The visualized paranasal sinuses and mastoid air cells are clear. No fracture is identified.  IMPRESSION: Unremarkable head CT.   Electronically Signed   By: Logan Bores   On: 10/14/2013 15:59    Discharge Laboratory Values: Lab Results  Component Value Date   WBC 2.8* 10/14/2013   HGB 10.3* 10/14/2013   HCT 29.7* 10/14/2013   MCV 88.4  10/14/2013   PLT 95* 10/14/2013   Lab Results  Component Value Date   NA 140 10/14/2013   K 3.5* 10/14/2013   CL 101 10/14/2013   CO2 27 10/14/2013    Brief H and P: For complete details please refer to admission H and P, but in brief, the patient was admitted to the hospital for further management on the syncopal episode of hypotension Physical Exam at Discharge: Please see progress note today BP 114/70  Pulse 83  Temp(Src) 98.3 F (36.8 C) (Oral)  Resp 18  Ht 5\' 2"  (1.575 m)  Wt 177 lb 0.5 oz (80.3 kg)  BMI 32.37 kg/m2  SpO2 100%  Hospital Course: Please see progress note dated today Active Problems:   Syncope  her low blood count is expected from recent chemotherapy. No further workup is needed. At the time of dismissal, she was seen by plastic surgery with further recommendations as outlined in that note.  Diet:  As tolerated  Activity:  As tolerated  Condition at Discharge:   Stable  Signed: Dr. Heath Lark [phone] Spent 35 minutes on discharge 10/15/2013, 3:54 PM

## 2013-10-15 NOTE — Progress Notes (Signed)
UR completed 

## 2013-10-15 NOTE — Progress Notes (Signed)
[place] [facility]  Telephone:([phone]) [phone]   I have seen the patient, examined her and agree with the documentation as outlined below HOSPITAL PROGRESS NOTE  Events since  2/3   Noted.No new syncopal episodes overnight. No chest pain, no SOB. No GI or GU complaints. "embarrased" by the appearance of her lip laceration.    MEDICATIONS:  Scheduled Meds: . aspirin  81 mg Oral Daily  . docusate sodium  100 mg Oral BID  . enoxaparin (LOVENOX) injection  40 mg Subcutaneous Q24H  . lidocaine-prilocaine   Topical Once  . magic mouthwash w/lidocaine  5 mL Oral QID  . pantoprazole  40 mg Oral Daily   Continuous Infusions: . sodium chloride 100 mL/hr at 10/15/13 0143   PRN Meds:.acetaminophen, alum & mag hydroxide-simeth, ondansetron (ZOFRAN) IV, ondansetron (ZOFRAN) IV, ondansetron, ondansetron, senna-docusate ALLERGIES:   Allergies  Allergen Reactions  . Latex Swelling and Rash     PHYSICAL EXAMINATION:   Filed Vitals:   10/15/13 0620  BP: 129/69  Pulse: 74  Temp: 98.1 F (36.7 C)  Resp: 18   Filed Weights   10/14/13 1436 10/15/13 0620  Weight: 178 lb 3.2 oz (80.831 kg) 177 lb 0.5 oz (80.39 kg)    68 year old  in no acute distress A. and O. x3 General well-developed and well-nourished  HEENT: Normocephalic,lower lip laceration, edematous but no longer bleeding.   Oral cavity without thrush or lesions. Neck supple. no thyromegaly, no cervical or supraclavicular adenopathy  Lungs clear bilaterally . No wheezing, rhonchi or rales. Cardiac: regular rate and rhythm,no murmur , rubs or gallops Abdomen soft nontender , bowel sounds x4. No HSM Extremities no clubbing cyanosis or edema. No bruising or petechial rash Neuro: non focal  LABORATORY/RADIOLOGY DATA:   Recent Labs Lab 10/08/13 0827 10/14/13 1455  WBC 5.9 2.8*  HGB 11.4* 10.3*  HCT 32.3* 29.7*  PLT 193 95*  MCV 89.0 88.4  MCH 31.2 30.7  MCHC 35.1 34.7  RDW 18.0* 15.4  LYMPHSABS 0.3* 0.2*    MONOABS 0.2 0.1  EOSABS 0.0 0.0  BASOSABS 0.1 0.0    CMP    Recent Labs Lab 10/08/13 0828 10/14/13 1455  NA 142 140  K 4.1 3.5*  CL  --  101  CO2 25 27  GLUCOSE 104 99  BUN 9.7 26*  CREATININE 0.9 0.91  CALCIUM 10.1 9.0  AST 26 14  ALT 23 16  ALKPHOS 73 94  BILITOT 0.75 0.5        Component Value Date/Time   BILITOT 0.5 10/14/2013 1455   BILITOT 0.75 10/08/2013 0828     Recent Labs  10/14/13 1455  TSH 1.615        Component Value Date/Time   ESRSEDRATE 8 03/03/2013 0923      Urinalysis    Component Value Date/Time   COLORURINE YELLOW 10/14/2013 1708   APPEARANCEUR CLEAR 10/14/2013 1708   LABSPEC 1.019 10/14/2013 1708   LABSPEC 1.015 07/23/2013 1523   PHURINE 6.0 10/14/2013 1708   GLUCOSEU NEGATIVE 10/14/2013 1708   GLUCOSEU Negative 07/23/2013 1523   HGBUR NEGATIVE 10/14/2013 1708   BILIRUBINUR NEGATIVE 10/14/2013 1708   KETONESUR NEGATIVE 10/14/2013 1708   PROTEINUR NEGATIVE 10/14/2013 1708   UROBILINOGEN 1.0 10/14/2013 1708   UROBILINOGEN 0.2 07/23/2013 1523   NITRITE NEGATIVE 10/14/2013 1708   LEUKOCYTESUR MODERATE* 10/14/2013 1708     Liver Function Tests:  Recent Labs Lab 10/08/13 0828 10/14/13 1455  AST 26 14  ALT 23 16  ALKPHOS 73 94  BILITOT 0.75 0.5  PROT 7.2 5.9*  ALBUMIN 4.5 3.7    Cardiac Enzymes:  Recent Labs Lab 10/14/13 1455  TROPONINI <0.30     Thyroid function studies  Recent Labs  10/14/13 1455  TSH 1.615    Radiology Studies:   X-ray Chest Pa And Lateral   10/14/2013   FINDINGS: Low lung volumes. Stable Port-A-Cath. Normal heart size. Lungs are grossly clear. No pneumothorax.  IMPRESSION: No active cardiopulmonary disease.   Electronically Signed   By: Maryclare Bean M.D.   On: 10/14/2013 16:11   Ct Head Wo Contrast  10/14/2013    FINDINGS: There is no evidence of acute infarct, mass, midline shift, intracranial hemorrhage, or extra-axial fluid collection. Ventricles and sulci are within normal limits for age. Orbits are  unremarkable. The visualized paranasal sinuses and mastoid air cells are clear. No fracture is identified.  IMPRESSION: Unremarkable head CT.   Electronically Signed   By: Logan Bores   On: 10/14/2013 15:59       ASSESSMENT AND PLAN:  #1 syncopal episode  Likely vasovagal as she strained for bowel movement prior to this event. She is on with observation. EKG normal. She received chemotherapy recently that could cause risk of congestive heart failure. Clinically she does not have signs and symptoms of congestive heart failure.Will observe for now.  CT scan of the head is negative for acute findings (i.e hemorrhage, stroke, masses) Of note: Patient reports that she has a "congenital disorder" which was evaluated at [facility] more than 10 years ago, and that this is not the first time she has a history of syncope. We need those records. Unable to find them on E-chart. She may need a cardiology consultation if she remains in hospital.  #2 lacerated lip   Plastic surgery  evaluation pending. Spoke with Dr. Iran Planas, [phone] [facility]). She will see patient today.  #3 broken tooth  This is trauma related. Dentistry consult pending. Call placed.  #4 hypotension  Blood pressure medication discontinued.On IV fluids for resuscitation.  Urinalysis, blood culture pending.Chest x-ray negative for acute disease.  I told her to continue holding of blood pressure medicine and home #5 DVT prophylaxis  I will put on Lovenox  #6  Pancytopenia, secondary to recent chemo. Monitor counts, stable no transfusion required  #7 High-grade follicular lymphoma, Stage 4  ,s/p C5 D1 chemo with Rituxan ,Cytoxan, Adriamycin,and 50 %,  Dose reduced Vincristine 1/28/15with Neulasta support on 1/29   Plan to discharge her after plastic surgery evaluation today. Followup is scheduled to see me in 2 weeks.  [place] E, PA-C 10/15/2013, 7:27 AM

## 2013-10-15 NOTE — Consult Note (Signed)
Date 10/15/13 Location: [facility] Reason for Consult: lip laceration Referring Physician: Dr. Linda Hedges is an 68 y.o. female.  HPI: AA female undergoing chemotherapy for lymphoma that suffered fall at home over day ago, am 10/14/13. Apparent syncopal episode and suffered lip laceration and chipped two teeth. Was admitted from clinic for syncope.  Pt reports no sensitivity with food today with regards to teeth, has had caps on injured teeth. Used mouthwash for dry mouth since on chemotherapy.  Past Medical History  Diagnosis Date  . CAD (coronary artery disease)   . GERD (gastroesophageal reflux disease)   . HTN (hypertension)   . Hyperlipidemia   . Enlargement of lymph nodes 07/26/2011  . 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 Lymphoma   . Tick bite of knee   . Thyroid disease   . Follicular lymphoma 40/81/4481  . 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  B-cell, favor follicular NHL.  CD79a, CD20 and CD10 positive.  Cyclin D1 negative.   . Mouth sores 07/18/2013  . Fever 07/23/2013  . Fungal infection 08/01/2013  . Hemorrhoids 08/05/2013  . Leukopenia 09/16/2013  . 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 Skull deformity 10/08/2013    Past Surgical History  Procedure Laterality Date  . Mr breast bilateral  cyst removal  . Abdominal hysterectomy    . Lymph node dissection  x 3 different times    behind left ear 2012/ left clavical area  . Tubal ligation    . Knee arthroscopy w/ debridement  05/2011    right  . Bone marrow biopsy  05/09/2013  . Bone marrow aspiration  05/09/2013    Family History  Problem Relation Age of Onset  . Cancer    . Stroke    . Heart disease    . Coronary artery disease    . Cancer Mother     breast  . Stroke Mother   . Cancer Father     pancreatic cancer  . Cancer Brother     kidney cancer/brain tumor     Social History:  reports that she has never smoked. She has never used smokeless tobacco. She reports that she does not drink alcohol or use illicit drugs.  Allergies:  Allergies  Allergen Reactions  . Latex Swelling and Rash    Medications: I have reviewed the patient's current medications.   ROS Blood pressure 114/70, pulse 83, temperature 98.3 F (36.8 C), temperature source Oral, resp. rate 18, height 5' 2"  (1.575 m), weight 80.3 kg (177 lb 0.5 oz), SpO2 100.00%. Physical Exam Alert oriented NAD HEENT: chipped crown of teeth 7, 8 with slight mobility of both, otherwise occlusion normal Abrasion dry vermillion and partial thickness laceration of wet mucosa right lower lip, transverse orientation +orbicularis oris function, edema of lower lip Some step off of mucosa  Assessment/Plan: Discussed with pt and husband as laceration is on wet mucosa, partial thickness, and >24 hours old that she could elect to do nothing. The mucosa will heal on its own within week if mouth kept clean. Her swelling is appropriate for time post injury and will improve slowly over next several days. Alternative would be bedside procedure now to reapproximate edges of wound; this may prevent any step off in mucosa.   Following discussion, plan to allow to heal on own. Instructed to brush teeth twice daily  and rinse mouth after each meal. Instructed to see dentist in next few days; teeth are viable but may need some stabilization. Soft foods and  Avoids nuts, seeds for next week to allow healing. Provided contact information in case concerns develop. Patient is awaiting discharge this pm.  Irene Limbo, MD [facility] [phone]

## 2013-10-17 ENCOUNTER — Other Ambulatory Visit: Payer: Self-pay | Admitting: Hematology and Oncology

## 2013-10-17 ENCOUNTER — Telehealth: Payer: Self-pay | Admitting: *Deleted

## 2013-10-17 ENCOUNTER — Encounter: Payer: Self-pay | Admitting: Hematology and Oncology

## 2013-10-17 DIAGNOSIS — S025XXA Fracture of tooth (traumatic), initial encounter for closed fracture: Secondary | ICD-10-CM | POA: Insufficient documentation

## 2013-10-17 NOTE — Telephone Encounter (Signed)
I will place an order for Dr. Enrique Sack She can resume her BP med at 1/2 tablet

## 2013-10-17 NOTE — Telephone Encounter (Signed)
Pt reports she took her BP med last night and her BP this morning is 151/87.  She will continue to take it if her systolic BP greater than 846.   States feeling ok,  Slight headaches and taking tylenol prn for this.  She is very self conscious about her broken tooth and asks if referral was made to the "cancer dentist?"  Asks if she has to wait to see Dr. Alvy Bimler next on 2/18 to see the dentist or can she see him sooner?

## 2013-10-17 NOTE — Telephone Encounter (Signed)
Per Melissa in scheduling,  Dr. Enrique Sack sees cancer pt with special needs not able to see their regular dentist.  Per Dr. Alvy Bimler,  [facility] for pt to see her regular dentist.  Called pt and instructed to call her regular dentist for eval and treatment of her loose and chipped teeth.  She will call them on Monday when their office re opens.  They are closed on Fridays.   Also instructed pt to take BP med 1/2 tablet daily and call us back on Monday if BP continues to be elevated.  Pt verbalized understanding.

## 2013-10-20 LAB — CULTURE, BLOOD (SINGLE): Culture: NO GROWTH

## 2013-10-21 ENCOUNTER — Telehealth: Payer: Self-pay | Admitting: Hematology and Oncology

## 2013-10-21 NOTE — Telephone Encounter (Signed)
dental referral for dr. Enrique Sack closed out. per desk nurse (2/6) pt will be sent to her regular dentist. no other orders per 2/6 pof.

## 2013-10-22 DIAGNOSIS — Z0000 Encounter for general adult medical examination without abnormal findings: Secondary | ICD-10-CM | POA: Diagnosis not present

## 2013-10-27 ENCOUNTER — Other Ambulatory Visit: Payer: Self-pay | Admitting: *Deleted

## 2013-10-27 ENCOUNTER — Telehealth: Payer: Self-pay | Admitting: Hematology and Oncology

## 2013-10-27 ENCOUNTER — Telehealth: Payer: Self-pay | Admitting: *Deleted

## 2013-10-27 ENCOUNTER — Encounter: Payer: Self-pay | Admitting: Hematology and Oncology

## 2013-10-27 ENCOUNTER — Ambulatory Visit (HOSPITAL_BASED_OUTPATIENT_CLINIC_OR_DEPARTMENT_OTHER): Payer: Medicare Other | Admitting: Hematology and Oncology

## 2013-10-27 ENCOUNTER — Ambulatory Visit (HOSPITAL_BASED_OUTPATIENT_CLINIC_OR_DEPARTMENT_OTHER): Payer: Medicare Other

## 2013-10-27 ENCOUNTER — Telehealth: Payer: Self-pay | Admitting: Internal Medicine

## 2013-10-27 VITALS — BP 150/85 | HR 87 | Temp 97.3°F | Resp 18 | Ht 62.0 in | Wt 179.3 lb

## 2013-10-27 DIAGNOSIS — C8299 Follicular lymphoma, unspecified, extranodal and solid organ sites: Secondary | ICD-10-CM

## 2013-10-27 DIAGNOSIS — K130 Diseases of lips: Secondary | ICD-10-CM | POA: Diagnosis not present

## 2013-10-27 DIAGNOSIS — D649 Anemia, unspecified: Secondary | ICD-10-CM | POA: Diagnosis not present

## 2013-10-27 DIAGNOSIS — C8239 Follicular lymphoma grade IIIa, extranodal and solid organ sites: Secondary | ICD-10-CM

## 2013-10-27 LAB — CBC WITH DIFFERENTIAL/PLATELET
BASO%: 3.3 % — AB (ref 0.0–2.0)
Basophils Absolute: 0.1 10*3/uL (ref 0.0–0.1)
EOS ABS: 0 10*3/uL (ref 0.0–0.5)
EOS%: 0.3 % (ref 0.0–7.0)
HCT: 29.8 % — ABNORMAL LOW (ref 34.8–46.6)
HGB: 10.2 g/dL — ABNORMAL LOW (ref 11.6–15.9)
LYMPH#: 0.6 10*3/uL — AB (ref 0.9–3.3)
LYMPH%: 16.5 % (ref 14.0–49.7)
MCH: 31 pg (ref 25.1–34.0)
MCHC: 34.4 g/dL (ref 31.5–36.0)
MCV: 90 fL (ref 79.5–101.0)
MONO#: 0.5 10*3/uL (ref 0.1–0.9)
MONO%: 14.8 % — ABNORMAL HIGH (ref 0.0–14.0)
NEUT%: 65.1 % (ref 38.4–76.8)
NEUTROS ABS: 2.3 10*3/uL (ref 1.5–6.5)
PLATELETS: 213 10*3/uL (ref 145–400)
RBC: 3.31 10*6/uL — AB (ref 3.70–5.45)
RDW: 16.2 % — ABNORMAL HIGH (ref 11.2–14.5)
WBC: 3.5 10*3/uL — ABNORMAL LOW (ref 3.9–10.3)

## 2013-10-27 LAB — COMPREHENSIVE METABOLIC PANEL (CC13)
ALK PHOS: 69 U/L (ref 40–150)
ALT: 17 U/L (ref 0–55)
ANION GAP: 9 meq/L (ref 3–11)
AST: 20 U/L (ref 5–34)
Albumin: 4 g/dL (ref 3.5–5.0)
BILIRUBIN TOTAL: 0.51 mg/dL (ref 0.20–1.20)
BUN: 12.6 mg/dL (ref 7.0–26.0)
CO2: 26 meq/L (ref 22–29)
Calcium: 9.9 mg/dL (ref 8.4–10.4)
Chloride: 107 mEq/L (ref 98–109)
Creatinine: 0.8 mg/dL (ref 0.6–1.1)
GLUCOSE: 88 mg/dL (ref 70–140)
Potassium: 3.6 mEq/L (ref 3.5–5.1)
Sodium: 143 mEq/L (ref 136–145)
Total Protein: 6.6 g/dL (ref 6.4–8.3)

## 2013-10-27 LAB — LACTATE DEHYDROGENASE (CC13): LDH: 247 U/L — AB (ref 125–245)

## 2013-10-27 MED ORDER — AMOXICILLIN 500 MG PO TABS: 500.0000 mg | ORAL_TABLET | Freq: Two times a day (BID) | ORAL | Status: DC

## 2013-10-27 NOTE — Progress Notes (Signed)
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 Needle biopsy was inconclusive.     08/02/2011 Imaging CT scan of the chest abdomen and pelvis showed numerous lymphadenopathy above and below the diaphragm.    08/16/2011 Imaging MRI of the abdomen revealed enlarging left kidney lesion worrisome for malignancy   09/20/2011 Initial Diagnosis Lymphoma, low grade from kidney biopsy, follicular   7/86/7544 Procedure Histologic type: Non-Hodgkin's lymphoma, follicular center cell type. Grade (if applicable): Favor high grade, Ki-67 ranges from 10-50%. Immunohistochemical stains: CD20, CD79a, CD10, BCL-2, BCL-6, CD21, CD3, CD43.   05/09/2013 Procedure BM biopsy is highly suspicious of involvement. Patient had persistent leukopenia   05/09/2013 Imaging PET/CT showed significant involvement of LN everywhere   07/16/2013 -  Chemotherapy Start cycle 1 of RCHOP   07/23/2013 - 07/27/2013 Hospital Admission Admitted for management of mucositis and neutropenic fever. Blood culture was positive for gram negative rods, resolved with antibiotics   10/08/2013 Adverse Reaction The dose of  vincristine is reduced by 92%    Follicular lymphoma (Resolved)    INTERVAL HISTORY: [name] 68 y.o. female returns for further followup. The patient was recently admitted to the hospital due to a fall. She also sustained lip laceration requiring sutures. The patient was placed on observation. CT scan of the head was negative. Since the last time I saw her, her lip became swollen with pus. She denies any fevers or chills. Denies any further syncopal episodes, dizziness or lightheadedness.   I have reviewed the past medical history, past surgical history, social history and family history with the patient and they are unchanged from previous note.  ALLERGIES:  is allergic to latex.  MEDICATIONS:  Current Outpatient Prescriptions  Medication Sig Dispense Refill  . acetaminophen (TYLENOL) 325 MG tablet Take 325 mg by mouth every 6 (six) hours as needed.      . Alum & Mag Hydroxide-Simeth (MAGIC MOUTHWASH W/LIDOCAINE) SOLN Take 5 mLs by mouth 4 (four) times daily.  240 mL  2  . aspirin 81 MG tablet Take 81 mg by mouth at bedtime.       . Cholecalciferol (VITAMIN D) 2000 UNITS tablet Take 2,000 Units by mouth daily.      . clobetasol cream (TEMOVATE) 0.10 % Apply 1 application topically daily. Apply to affected area      . docusate sodium (COLACE) 100 MG capsule Take 1 capsule (100 mg total) by mouth 2 (two) times daily.  100 capsule  1  . lansoprazole (PREVACID) 30 MG capsule Take 30 mg by mouth daily.       Marland Kitchen lidocaine-prilocaine (EMLA) cream Apply topically as needed.  [place]  g  0  . losartan-hydrochlorothiazide (HYZAAR) 100-12.5 MG per tablet       . nystatin cream (MYCOSTATIN)       . Omega-3 Fatty Acids (FISH OIL) 1200 MG CAPS Take 2 capsules by mouth daily.       . potassium chloride SA (K-DUR,KLOR-CON) 20 MEQ tablet Take 20 mEq by mouth daily.       . Probiotic Product (PROBIOTIC DAILY PO) Take 1 capsule by mouth daily.      Marland Kitchen pyridOXINE (VITAMIN B-6) 100 MG tablet Take 100 mg  by mouth daily.       . Red Yeast Rice 600 MG CAPS Take 1 capsule by mouth every evening.       . triamcinolone cream (KENALOG) 0.1 %       . vitamin C (ASCORBIC ACID) 500 MG tablet Take 500 mg by mouth daily.      Marland Kitchen amoxicillin (AMOXIL) 500 MG tablet Take 1 tablet (500 mg total) by mouth 2 (two) times daily.  20 tablet  0  . Calcium-Magnesium-Zinc (CAL-MAG-ZINC PO) Take 1 tablet by mouth daily.       . ondansetron (ZOFRAN) 8 MG tablet Take 8 mg by mouth every 8 (eight) hours as needed for nausea or vomiting.      . prochlorperazine (COMPAZINE) 10 MG tablet Take 1 tablet (10 mg total) by mouth every 6 (six) hours as needed (Nausea or vomiting).  30 tablet  6   No current facility-administered medications for this visit.    REVIEW OF SYSTEMS:   Constitutional: Denies fevers, chills or abnormal weight loss Eyes: Denies blurriness of vision Ears, nose, mouth, throat, and face: Denies mucositis or sore throat Respiratory: Denies cough, dyspnea or wheezes Cardiovascular: Denies palpitation, chest discomfort or lower extremity swelling Gastrointestinal:  Denies nausea, heartburn or change in bowel habits Skin: Denies abnormal skin rashes Lymphatics: Denies new lymphadenopathy or easy bruising Neurological:Denies numbness, tingling or new weaknesses Behavioral/Psych: Mood is stable, no new changes  All other systems were reviewed with the patient and are negative.  PHYSICAL EXAMINATION: ECOG PERFORMANCE STATUS: 1 - Symptomatic but completely ambulatory  Filed Vitals:   10/27/13 1148  BP: 150/85  Pulse: 87  Temp: 97.3 F (36.3 C)  Resp: 18   Filed Weights   10/27/13 1148  Weight: 179 lb 4.8 oz (81.33 kg)    GENERAL:alert, no distress and comfortable SKIN: skin color, texture, turgor are normal, no rashes or significant lesions EYES: normal, Conjunctiva are pink and non-injected, sclera clear OROPHARYNX: I noted significant swelling on the lower lip with pus NECK: supple, thyroid  normal size, non-tender, without nodularity LYMPH:  no palpable lymphadenopathy in the cervical, axillary or inguinal LUNGS: clear to auscultation and percussion with normal breathing effort HEART: regular rate & rhythm and no murmurs and no lower extremity edema ABDOMEN:abdomen soft, non-tender and normal bowel sounds Musculoskeletal:no cyanosis of digits and no clubbing  NEURO: alert & oriented x 3 with fluent speech, no focal motor/sensory deficits  LABORATORY DATA:  I have reviewed the data as listed    Component Value Date/Time   NA 143 10/27/2013 1136   NA 140 10/14/2013 1455   K 3.6 10/27/2013 1136   K 3.5* 10/14/2013 1455   CL 101 10/14/2013 1455   CO2 26 10/27/2013 1136   CO2 27 10/14/2013 1455   GLUCOSE 88 10/27/2013 1136   GLUCOSE 99 10/14/2013 1455   BUN 12.6 10/27/2013 1136   BUN 26* 10/14/2013 1455   CREATININE  0.8 10/27/2013 1136   CREATININE 0.91 10/14/2013 1455   CALCIUM 9.9 10/27/2013 1136   CALCIUM 9.0 10/14/2013 1455   PROT 6.6 10/27/2013 1136   PROT 5.9* 10/14/2013 1455   ALBUMIN 4.0 10/27/2013 1136   ALBUMIN 3.7 10/14/2013 1455   AST 20 10/27/2013 1136   AST 14 10/14/2013 1455   ALT 17 10/27/2013 1136   ALT 16 10/14/2013 1455   ALKPHOS 69 10/27/2013 1136   ALKPHOS 94 10/14/2013 1455   BILITOT 0.51 10/27/2013 1136   BILITOT 0.5 10/14/2013 1455   GFRNONAA 63* 10/14/2013 1455   GFRAA 73* 10/14/2013 1455    No results found for this basename: SPEP, UPEP,  kappa and lambda light chains    Lab Results  Component Value Date   WBC 3.5* 10/27/2013   NEUTROABS 2.3 10/27/2013   HGB 10.2* 10/27/2013   HCT 29.8* 10/27/2013   MCV 90.0 10/27/2013   PLT 213 10/27/2013      Chemistry      Component Value Date/Time   NA 143 10/27/2013 1136   NA 140 10/14/2013 1455   K 3.6 10/27/2013 1136   K 3.5* 10/14/2013 1455   CL 101 10/14/2013 1455   CO2 26 10/27/2013 1136   CO2 27 10/14/2013 1455   BUN 12.6 10/27/2013 1136   BUN 26* 10/14/2013 1455   CREATININE 0.8 10/27/2013 1136   CREATININE 0.91 10/14/2013 1455       Component Value Date/Time   CALCIUM 9.9 10/27/2013 1136   CALCIUM 9.0 10/14/2013 1455   ALKPHOS 69 10/27/2013 1136   ALKPHOS 94 10/14/2013 1455   AST 20 10/27/2013 1136   AST 14 10/14/2013 1455   ALT 17 10/27/2013 1136   ALT 16 10/14/2013 1455   BILITOT 0.51 10/27/2013 1136   BILITOT 0.5 10/14/2013 1455     ASSESSMENT & PLAN:  #1 follicular lymphoma I will delay chemotherapy by a week to treat her infected lip #2 infected lower lip I will give her antibiotics amoxicillin to be taken 2 times a day for 10 days. I told the patient to call me at the end of the week for report #3 mild anemia #4 mild leukopenia This is related to recent chemotherapy. She is not symptomatic. We will observe #5 hypotension and recent fall I recommend the patient to hold her blood pressure medicine and only take half a pill as needed. She will continue to monitor her blood pressure carefully over the next week.  All questions were answered. The patient knows to call the clinic with any problems, questions or concerns. No barriers to learning was detected. I spent 25 minutes counseling the patient face to face. The total time spent in the appointment was 40 minutes and more than 50% was on counseling and review of test results     [facility], [place], MD 10/27/2013 12:45 PM

## 2013-10-27 NOTE — Telephone Encounter (Signed)
Pt left a voice mail that her lip has been swelling and is now draining on the outside. Wants to know if she needs an antibiotic? She has been using "triple antibiotic ointment"

## 2013-10-27 NOTE — Telephone Encounter (Signed)
I have tried to schedule treatment per POF for 2/25. No avilable on that day, moved to 2/26. Advised scheduler and MD

## 2013-10-27 NOTE — Telephone Encounter (Signed)
Talked to pt and she is aware of md lab and next day chemo for February 2015

## 2013-10-27 NOTE — Telephone Encounter (Signed)
per 2/16 pof r/s 2/18 lb-f/u to today - other appts remain as is. per desk nurse pt is aware of appt d/t for today.

## 2013-10-27 NOTE — Telephone Encounter (Signed)
r/s appt to April due to MD's PAL, mailed app to pt , telephone neumber not working

## 2013-10-27 NOTE — Telephone Encounter (Signed)
Gave pt appt for lab and MD , emailed Michelle regarding chemo for February 2015 °

## 2013-10-29 ENCOUNTER — Ambulatory Visit: Payer: Medicare Other | Admitting: Hematology and Oncology

## 2013-10-29 ENCOUNTER — Ambulatory Visit: Payer: Medicare Other

## 2013-10-29 ENCOUNTER — Other Ambulatory Visit: Payer: Medicare Other

## 2013-10-30 ENCOUNTER — Ambulatory Visit: Payer: Medicare Other

## 2013-10-30 ENCOUNTER — Telehealth: Payer: Self-pay | Admitting: *Deleted

## 2013-10-30 NOTE — Telephone Encounter (Signed)
Pt left VM states she started taking whole BP med last night and her BP this morning was 127/88 and 139/88 now.  States still "draining a little" but doing ok and see Dentist in the morning.

## 2013-11-04 ENCOUNTER — Telehealth: Payer: Self-pay | Admitting: *Deleted

## 2013-11-04 NOTE — Telephone Encounter (Signed)
Pt has appt for lab/Dr. Alvy Bimler tomorrow on 2/25 and Chemo scheduled on 2/26.  She asks if she can have chemo tomorrow while she is here.  She missed her last chemo treatment due to infection.  Her chemo appt was able to be moved to tomorrow after MD visit.  Informed pt and she was grateful,  Verbalized understanding.

## 2013-11-05 ENCOUNTER — Encounter: Payer: Self-pay | Admitting: Hematology and Oncology

## 2013-11-05 ENCOUNTER — Telehealth: Payer: Self-pay | Admitting: *Deleted

## 2013-11-05 ENCOUNTER — Other Ambulatory Visit (HOSPITAL_BASED_OUTPATIENT_CLINIC_OR_DEPARTMENT_OTHER): Payer: Medicare Other

## 2013-11-05 ENCOUNTER — Ambulatory Visit (HOSPITAL_BASED_OUTPATIENT_CLINIC_OR_DEPARTMENT_OTHER): Payer: Medicare Other | Admitting: Hematology and Oncology

## 2013-11-05 ENCOUNTER — Ambulatory Visit (HOSPITAL_BASED_OUTPATIENT_CLINIC_OR_DEPARTMENT_OTHER): Payer: Medicare Other

## 2013-11-05 VITALS — BP 144/79 | HR 82 | Temp 97.9°F | Resp 20 | Ht 62.0 in | Wt 182.3 lb

## 2013-11-05 VITALS — BP 143/70 | HR 85 | Temp 97.4°F | Resp 18

## 2013-11-05 DIAGNOSIS — C8239 Follicular lymphoma grade IIIa, extranodal and solid organ sites: Secondary | ICD-10-CM

## 2013-11-05 DIAGNOSIS — C8299 Follicular lymphoma, unspecified, extranodal and solid organ sites: Secondary | ICD-10-CM

## 2013-11-05 DIAGNOSIS — G609 Hereditary and idiopathic neuropathy, unspecified: Secondary | ICD-10-CM

## 2013-11-05 DIAGNOSIS — D6481 Anemia due to antineoplastic chemotherapy: Secondary | ICD-10-CM | POA: Diagnosis not present

## 2013-11-05 DIAGNOSIS — T451X5A Adverse effect of antineoplastic and immunosuppressive drugs, initial encounter: Secondary | ICD-10-CM | POA: Diagnosis not present

## 2013-11-05 DIAGNOSIS — I959 Hypotension, unspecified: Secondary | ICD-10-CM | POA: Diagnosis not present

## 2013-11-05 DIAGNOSIS — Z5112 Encounter for antineoplastic immunotherapy: Secondary | ICD-10-CM

## 2013-11-05 DIAGNOSIS — Z5111 Encounter for antineoplastic chemotherapy: Secondary | ICD-10-CM | POA: Diagnosis not present

## 2013-11-05 LAB — CBC WITH DIFFERENTIAL/PLATELET
BASO%: 3.1 % — AB (ref 0.0–2.0)
BASOS ABS: 0.1 10*3/uL (ref 0.0–0.1)
EOS%: 2.3 % (ref 0.0–7.0)
Eosinophils Absolute: 0.1 10*3/uL (ref 0.0–0.5)
HCT: 30.8 % — ABNORMAL LOW (ref 34.8–46.6)
HGB: 10.7 g/dL — ABNORMAL LOW (ref 11.6–15.9)
LYMPH#: 0.4 10*3/uL — AB (ref 0.9–3.3)
LYMPH%: 10 % — ABNORMAL LOW (ref 14.0–49.7)
MCH: 31.5 pg (ref 25.1–34.0)
MCHC: 34.9 g/dL (ref 31.5–36.0)
MCV: 90.1 fL (ref 79.5–101.0)
MONO#: 0.3 10*3/uL (ref 0.1–0.9)
MONO%: 5.9 % (ref 0.0–14.0)
NEUT#: 3.4 10*3/uL (ref 1.5–6.5)
NEUT%: 78.7 % — ABNORMAL HIGH (ref 38.4–76.8)
Platelets: 195 10*3/uL (ref 145–400)
RBC: 3.42 10*6/uL — ABNORMAL LOW (ref 3.70–5.45)
RDW: 15.8 % — AB (ref 11.2–14.5)
WBC: 4.3 10*3/uL (ref 3.9–10.3)

## 2013-11-05 LAB — COMPREHENSIVE METABOLIC PANEL (CC13)
ALT: 23 U/L (ref 0–55)
ANION GAP: 9 meq/L (ref 3–11)
AST: 23 U/L (ref 5–34)
Albumin: 4.1 g/dL (ref 3.5–5.0)
Alkaline Phosphatase: 67 U/L (ref 40–150)
BUN: 15.8 mg/dL (ref 7.0–26.0)
CALCIUM: 9.7 mg/dL (ref 8.4–10.4)
CHLORIDE: 108 meq/L (ref 98–109)
CO2: 25 mEq/L (ref 22–29)
CREATININE: 0.8 mg/dL (ref 0.6–1.1)
Glucose: 107 mg/dl (ref 70–140)
POTASSIUM: 4.1 meq/L (ref 3.5–5.1)
SODIUM: 142 meq/L (ref 136–145)
Total Bilirubin: 0.48 mg/dL (ref 0.20–1.20)
Total Protein: 6.7 g/dL (ref 6.4–8.3)

## 2013-11-05 LAB — LACTATE DEHYDROGENASE (CC13): LDH: 219 U/L (ref 125–245)

## 2013-11-05 MED ORDER — ONDANSETRON 16 MG/50ML IVPB (CHCC)
16.0000 mg | Freq: Once | INTRAVENOUS | Status: AC
Start: 2013-11-05 — End: 2013-11-05
  Administered 2013-11-05: 16 mg via INTRAVENOUS

## 2013-11-05 MED ORDER — ACETAMINOPHEN 325 MG PO TABS
650.0000 mg | ORAL_TABLET | Freq: Once | ORAL | Status: AC
  Administered 2013-11-05: 650 mg via ORAL

## 2013-11-05 MED ORDER — VINCRISTINE SULFATE CHEMO INJECTION 1 MG/ML
1.0000 mg | Freq: Once | INTRAVENOUS | Status: AC
  Administered 2013-11-05: 1 mg via INTRAVENOUS
  Filled 2013-11-05: qty 1

## 2013-11-05 MED ORDER — DIPHENHYDRAMINE HCL 25 MG PO CAPS
50.0000 mg | ORAL_CAPSULE | Freq: Once | ORAL | Status: AC
  Administered 2013-11-05: 50 mg via ORAL

## 2013-11-05 MED ORDER — SODIUM CHLORIDE 0.9 % IV SOLN
Freq: Once | INTRAVENOUS | Status: AC
  Administered 2013-11-05: 09:00:00 via INTRAVENOUS

## 2013-11-05 MED ORDER — ACETAMINOPHEN 325 MG PO TABS
ORAL_TABLET | ORAL | Status: AC
  Filled 2013-11-05: qty 2

## 2013-11-05 MED ORDER — ONDANSETRON 16 MG/50ML IVPB (CHCC)
INTRAVENOUS | Status: AC
  Filled 2013-11-05: qty 16

## 2013-11-05 MED ORDER — SODIUM CHLORIDE 0.9 % IV SOLN
600.0000 mg/m2 | Freq: Once | INTRAVENOUS | Status: AC
  Administered 2013-11-05: 1160 mg via INTRAVENOUS
  Filled 2013-11-05: qty 58

## 2013-11-05 MED ORDER — DOXORUBICIN HCL CHEMO IV INJECTION 2 MG/ML
40.0000 mg/m2 | Freq: Once | INTRAVENOUS | Status: AC
  Administered 2013-11-05: 76 mg via INTRAVENOUS
  Filled 2013-11-05: qty 38

## 2013-11-05 MED ORDER — DIPHENHYDRAMINE HCL 25 MG PO CAPS
ORAL_CAPSULE | ORAL | Status: AC
  Filled 2013-11-05: qty 2

## 2013-11-05 MED ORDER — DEXAMETHASONE SODIUM PHOSPHATE 20 MG/5ML IJ SOLN
INTRAMUSCULAR | Status: AC
  Filled 2013-11-05: qty 5

## 2013-11-05 MED ORDER — SODIUM CHLORIDE 0.9 % IJ SOLN
10.0000 mL | INTRAMUSCULAR | Status: DC | PRN
  Administered 2013-11-05: 10 mL
  Filled 2013-11-05: qty 10

## 2013-11-05 MED ORDER — HEPARIN SOD (PORK) LOCK FLUSH 100 UNIT/ML IV SOLN
500.0000 [IU] | Freq: Once | INTRAVENOUS | Status: AC | PRN
  Administered 2013-11-05: 500 [IU]
  Filled 2013-11-05: qty 5

## 2013-11-05 MED ORDER — SODIUM CHLORIDE 0.9 % IV SOLN
375.0000 mg/m2 | Freq: Once | INTRAVENOUS | Status: AC
  Administered 2013-11-05: 700 mg via INTRAVENOUS
  Filled 2013-11-05: qty 70

## 2013-11-05 MED ORDER — DEXAMETHASONE SODIUM PHOSPHATE 20 MG/5ML IJ SOLN
20.0000 mg | Freq: Once | INTRAMUSCULAR | Status: AC
  Administered 2013-11-05: 20 mg via INTRAVENOUS

## 2013-11-05 NOTE — Patient Instructions (Signed)
[facility] Discharge Instructions for Patients Receiving Chemotherapy  Today you received the following chemotherapy agents Adriamycin, Vincristine, Cytoxan and Rituxan.  To help prevent nausea and vomiting after your treatment, we encourage you to take your nausea medication.   If you develop nausea and vomiting that is not controlled by your nausea medication, call the clinic.   BELOW ARE SYMPTOMS THAT SHOULD BE REPORTED IMMEDIATELY:  *FEVER GREATER THAN 100.5 F  *CHILLS WITH OR WITHOUT FEVER  NAUSEA AND VOMITING THAT IS NOT CONTROLLED WITH YOUR NAUSEA MEDICATION  *UNUSUAL SHORTNESS OF BREATH  *UNUSUAL BRUISING OR BLEEDING  TENDERNESS IN MOUTH AND THROAT WITH OR WITHOUT PRESENCE OF ULCERS  *URINARY PROBLEMS  *BOWEL PROBLEMS  UNUSUAL RASH Items with * indicate a potential emergency and should be followed up as soon as possible.  Feel free to call the clinic you have any questions or concerns. The clinic phone number is [phone].    

## 2013-11-05 NOTE — Telephone Encounter (Signed)
Per staff message and POF I have scheduled appts.  JMW  

## 2013-11-05 NOTE — Progress Notes (Signed)
1015- Patient states she feels wetness at the portacath site. Area noted to be moist and a small amount of pink fluid noted underneath dressing and leaking out of dressing. Infusion stopped immediately and area dried with gauze. Portacath deaccessed and area washed with soap and water. Portacath then reassessed and treatment restarted.   1100- Redness around portacath area noted to be resolved. Gave patient aquafor ointment to apply at home.

## 2013-11-05 NOTE — Telephone Encounter (Signed)
appts made and printed. Pt is aware that cs will call w/ appt for PET...td

## 2013-11-05 NOTE — Progress Notes (Signed)
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 Needle biopsy was inconclusive.     08/02/2011 Imaging CT scan of the chest abdomen and pelvis showed numerous lymphadenopathy above and below the diaphragm.    08/16/2011 Imaging MRI of the abdomen revealed enlarging left kidney lesion worrisome for malignancy   09/20/2011 Initial Diagnosis Lymphoma, low grade from kidney biopsy, follicular   8/56/3149 Procedure Histologic type: Non-Hodgkin's lymphoma, follicular center cell type. Grade (if applicable): Favor high grade, Ki-67 ranges from 10-50%. Immunohistochemical stains: CD20, CD79a, CD10, BCL-2, BCL-6, CD21, CD3, CD43.   05/09/2013 Procedure BM biopsy is highly suspicious of involvement. Patient had persistent leukopenia   05/09/2013 Imaging PET/CT showed significant involvement of LN everywhere   07/16/2013 -  Chemotherapy Start cycle 1 of [facility]OP   07/23/2013 - 07/27/2013 Hospital Admission Admitted for management of mucositis and neutropenic fever. Blood culture was positive for gram negative rods, resolved with antibiotics   10/08/2013 Adverse  Reaction The dose of vincristine is reduced by 70%    Follicular lymphoma (Resolved)    INTERVAL HISTORY: [name] 68 y.o. female returns for further followup. The infection on the liver is improving. She recently saw her dentist who did not feel any form of repair is necessary. She denies any further lightheadedness, dizziness or syncopal episodes. She complained of some mild tingling and numbness in her hands it comes and goes.  I have reviewed the past medical history, past surgical history, social history and family history with the patient and they are unchanged from previous note.  ALLERGIES:  is allergic to latex.  MEDICATIONS:  Current Outpatient Prescriptions  Medication Sig Dispense Refill  . acetaminophen (TYLENOL) 325 MG tablet Take 325 mg by mouth every 6 (six) hours as needed.      . Alum & Mag Hydroxide-Simeth (MAGIC MOUTHWASH W/LIDOCAINE) SOLN Take 5 mLs by mouth 4 (four) times daily.  240 mL  2  . aspirin 81 MG tablet Take 81 mg by mouth at bedtime.       . Cholecalciferol (VITAMIN D) 2000 UNITS tablet Take 2,000 Units by mouth daily.      . clobetasol cream (TEMOVATE) 2.63 % Apply 1 application topically daily. Apply to affected area      . docusate sodium (COLACE) 100 MG capsule Take 1 capsule (100 mg total) by mouth 2 (two) times daily.  100 capsule  1  . lansoprazole (PREVACID) 30 MG capsule Take 30 mg by mouth daily.       Marland Kitchen lidocaine-prilocaine (EMLA) cream Apply topically as needed.  30 g  0  . losartan-hydrochlorothiazide (HYZAAR) 100-12.5 MG  per tablet       . nystatin cream (MYCOSTATIN)       . Omega-3 Fatty Acids (FISH OIL) 1200 MG CAPS Take 2 capsules by mouth daily.       . potassium chloride SA (K-DUR,KLOR-CON) 20 MEQ tablet Take 20 mEq by mouth daily.       . Probiotic Product (PROBIOTIC DAILY PO) Take 1 capsule by mouth daily.      . prochlorperazine (COMPAZINE) 10 MG tablet Take 1 tablet (10 mg total) by mouth every 6 (six) hours as needed  (Nausea or vomiting).  30 tablet  6  . pyridOXINE (VITAMIN B-6) 100 MG tablet Take 100 mg by mouth daily.       . Red Yeast Rice 600 MG CAPS Take 1 capsule by mouth every evening.       . triamcinolone cream (KENALOG) 0.1 %       . vitamin C (ASCORBIC ACID) 500 MG tablet Take 500 mg by mouth daily.      . ondansetron (ZOFRAN) 8 MG tablet Take 8 mg by mouth every 8 (eight) hours as needed for nausea or vomiting.       No current facility-administered medications for this visit.    REVIEW OF SYSTEMS:   Constitutional: Denies fevers, chills or abnormal weight loss Eyes: Denies blurriness of vision Ears, nose, mouth, throat, and face: Denies mucositis or sore throat Respiratory: Denies cough, dyspnea or wheezes Cardiovascular: Denies palpitation, chest discomfort or lower extremity swelling Gastrointestinal:  Denies nausea, heartburn or change in bowel habits Skin: Denies abnormal skin rashes Lymphatics: Denies new lymphadenopathy or easy bruising Behavioral/Psych: Mood is stable, no new changes  All other systems were reviewed with the patient and are negative.  PHYSICAL EXAMINATION: ECOG PERFORMANCE STATUS: 1 - Symptomatic but completely ambulatory  Filed Vitals:   11/05/13 0815  BP: 144/79  Pulse: 82  Temp: 97.9 F (36.6 C)  Resp: 20   Filed Weights   11/05/13 0815  Weight: 182 lb 4.8 oz (82.691 kg)    GENERAL:alert, no distress and comfortable SKIN: skin color, texture, turgor are normal, no rashes or significant lesions EYES: normal, Conjunctiva are pink and non-injected, sclera clear OROPHARYNX:no exudate, her lip is healing well. No thrush LYMPH:  no palpable lymphadenopathy in the cervical, axillary or inguinal LUNGS: clear to auscultation and percussion with normal breathing effort HEART: regular rate & rhythm and no murmurs and no lower extremity edema ABDOMEN:abdomen soft, non-tender and normal bowel sounds Musculoskeletal:no cyanosis of digits and no clubbing   NEURO: alert & oriented x 3 with fluent speech, no focal motor/sensory deficits  LABORATORY DATA:  I have reviewed the data as listed    Component Value Date/Time   NA 142 11/05/2013 0801   NA 140 10/14/2013 1455   K 4.1 11/05/2013 0801   K 3.5* 10/14/2013 1455   CL 101 10/14/2013 1455   CO2 25 11/05/2013 0801   CO2 27 10/14/2013 1455   GLUCOSE 107 11/05/2013 0801   GLUCOSE 99 10/14/2013 1455   BUN 15.8 11/05/2013 0801   BUN 26* 10/14/2013 1455   CREATININE 0.8 11/05/2013 0801   CREATININE 0.91 10/14/2013 1455   CALCIUM 9.7 11/05/2013 0801   CALCIUM 9.0 10/14/2013 1455   PROT 6.7 11/05/2013 0801   PROT 5.9* 10/14/2013 1455   ALBUMIN 4.1 11/05/2013 0801   ALBUMIN 3.7 10/14/2013 1455   AST 23 11/05/2013 0801   AST 14 10/14/2013 1455   ALT 23 11/05/2013 0801  ALT 16 10/14/2013 1455   ALKPHOS 67 11/05/2013 0801   ALKPHOS 94 10/14/2013 1455   BILITOT 0.48 11/05/2013 0801   BILITOT 0.5 10/14/2013 1455   GFRNONAA 63* 10/14/2013 1455   GFRAA 73* 10/14/2013 1455    No results found for this basename: SPEP, UPEP,  kappa and lambda light chains    Lab Results  Component Value Date   WBC 4.3 11/05/2013   NEUTROABS 3.4 11/05/2013   HGB 10.7* 11/05/2013   HCT 30.8* 11/05/2013   MCV 90.1 11/05/2013   PLT 195 11/05/2013      Chemistry      Component Value Date/Time   NA 142 11/05/2013 0801   NA 140 10/14/2013 1455   K 4.1 11/05/2013 0801   K 3.5* 10/14/2013 1455   CL 101 10/14/2013 1455   CO2 25 11/05/2013 0801   CO2 27 10/14/2013 1455   BUN 15.8 11/05/2013 0801   BUN 26* 10/14/2013 1455   CREATININE 0.8 11/05/2013 0801   CREATININE 0.91 10/14/2013 1455      Component Value Date/Time   CALCIUM 9.7 11/05/2013 0801   CALCIUM 9.0 10/14/2013 1455   ALKPHOS 67 11/05/2013 0801   ALKPHOS 94 10/14/2013 1455   AST 23 11/05/2013 0801   AST 14 10/14/2013 1455   ALT 23 11/05/2013 0801   ALT 16 10/14/2013 1455   BILITOT 0.48 11/05/2013 0801   BILITOT 0.5 10/14/2013 1455     ASSESSMENT & PLAN:  #1 follicular lymphoma I will proceed with  chemotherapy today. After treatment, I plan to order a PET CT scan in 6 weeks and see her back to review test results. Due to her stage IV disease status, I felt it would be of benefit to go on maintenance rituximab in the future if her PET CT scan show that the patient has achieved complete remission. #2 Recent infected lower lip It has resolved #3 mild anemia This is related to recent chemotherapy. She is not symptomatic. We will observe #4 Broken tooth I recommend the patient to delay dental work until a month after her chemotherapy today #5 hypotension and recent fall I recommend she reduce her blood pressure medication to half a tablet for the next week to reduce the risk of postural hypotension #6 mild peripheral neuropathy This is only mild grade 1. I recommend observation only. The dose of vincristine has a radiating reduced by 50%. Orders Placed This Encounter  Procedures  . NM PET Image Restag (PS) Skull Base To Thigh    Standing Status: Future     Number of Occurrences:      Standing Expiration Date: 01/05/2015    Order Specific Question:  Reason for Exam (SYMPTOM  OR DIAGNOSIS REQUIRED)    Answer:  lymphoma, assess response to Rx    Order Specific Question:  Preferred imaging location?    Answer:  [facility]   All questions were answered. The patient knows to call the clinic with any problems, questions or concerns. No barriers to learning was detected.     [place], [place], MD 11/05/2013 9:01 AM

## 2013-11-06 ENCOUNTER — Ambulatory Visit (HOSPITAL_BASED_OUTPATIENT_CLINIC_OR_DEPARTMENT_OTHER): Payer: Medicare Other

## 2013-11-06 ENCOUNTER — Ambulatory Visit: Payer: Medicare Other

## 2013-11-06 VITALS — BP 154/84 | HR 78 | Temp 98.1°F

## 2013-11-06 DIAGNOSIS — C8239 Follicular lymphoma grade IIIa, extranodal and solid organ sites: Secondary | ICD-10-CM

## 2013-11-06 DIAGNOSIS — Z5189 Encounter for other specified aftercare: Secondary | ICD-10-CM

## 2013-11-06 DIAGNOSIS — C8299 Follicular lymphoma, unspecified, extranodal and solid organ sites: Secondary | ICD-10-CM

## 2013-11-06 MED ORDER — PEGFILGRASTIM INJECTION 6 MG/0.6ML
6.0000 mg | Freq: Once | SUBCUTANEOUS | Status: AC
  Administered 2013-11-06: 6 mg via SUBCUTANEOUS

## 2013-11-07 ENCOUNTER — Ambulatory Visit: Payer: Medicare Other

## 2013-11-10 ENCOUNTER — Encounter: Payer: Self-pay | Admitting: Hematology and Oncology

## 2013-11-10 ENCOUNTER — Telehealth: Payer: Self-pay | Admitting: *Deleted

## 2013-11-10 ENCOUNTER — Other Ambulatory Visit: Payer: Self-pay | Admitting: Hematology and Oncology

## 2013-11-10 DIAGNOSIS — N390 Urinary tract infection, site not specified: Secondary | ICD-10-CM | POA: Insufficient documentation

## 2013-11-10 MED ORDER — CIPROFLOXACIN HCL 500 MG PO TABS: 500.0000 mg | ORAL_TABLET | Freq: Two times a day (BID) | ORAL | Status: DC

## 2013-11-10 NOTE — Telephone Encounter (Signed)
Pt reports burning w/ urination started late last night and also this morning.  Also thinks she sees some blood on her tissue after wiping.  Pt denies any fevers or other symptoms.  Notified Dr. Alvy Bimler and she sent Rx for Cipro to pt's pharmacy.  Instructed pt on taking Cipro BID for 3 days and call us back on Friday morning to let us know how she is doing.  Call us back sooner if she feels worse.  Drink plenty of fluids.  Pt verbalized understanding.

## 2013-11-14 ENCOUNTER — Telehealth: Payer: Self-pay | Admitting: *Deleted

## 2013-11-14 NOTE — Telephone Encounter (Signed)
Pt reports "really bad indigestion and heart burn" since taking Cipro for presumed bladder infection.  She started Cipro on Monday and finished it yesterday.  She has not noticed much improvement in her symptoms,  Still has some burning w/ urination although it may be "a little bit better."  She is drinking a lot of fluids including cranberry juice.  She is not concerned about her urinary symptoms but wants to know what she can take for the indigestion.  She currently taking prevacid daily but it doesn't seem to be helping.

## 2013-11-14 NOTE — Telephone Encounter (Signed)
Buy TUMS (calcium carbonate) OTC and take prn

## 2013-11-14 NOTE — Telephone Encounter (Signed)
Instructed pt to take TUMs as needed for the indigestion.  Instructed her to call on Monday if her urinary symptoms are not improved.  She verbalized understanding.

## 2013-12-05 ENCOUNTER — Telehealth: Payer: Self-pay | Admitting: *Deleted

## 2013-12-05 NOTE — Telephone Encounter (Signed)
Pt called to report her leg pain is much improved.  States she had called this past Monday and was instructed to stop her Vitamin C and start taking Calcium.  She feels this helped and wanted Dr. Alvy Bimler to know.

## 2013-12-07 ENCOUNTER — Telehealth: Payer: Self-pay | Admitting: Hematology and Oncology

## 2013-12-07 NOTE — Telephone Encounter (Signed)
DISREGARDED 3/27 POF - PER POF SENT IN ERROR

## 2013-12-11 ENCOUNTER — Encounter: Payer: Self-pay | Admitting: *Deleted

## 2013-12-15 ENCOUNTER — Ambulatory Visit (HOSPITAL_COMMUNITY)
Admission: RE | Admit: 2013-12-15 | Discharge: 2013-12-15 | Disposition: A | Discharge status: Home / Self Care | Payer: Medicare Other | Source: Ambulatory Visit | Attending: Hematology and Oncology | Admitting: Hematology and Oncology

## 2013-12-15 DIAGNOSIS — C787 Secondary malignant neoplasm of liver and intrahepatic bile duct: Secondary | ICD-10-CM | POA: Insufficient documentation

## 2013-12-15 DIAGNOSIS — C8299 Follicular lymphoma, unspecified, extranodal and solid organ sites: Secondary | ICD-10-CM | POA: Insufficient documentation

## 2013-12-15 DIAGNOSIS — C8239 Follicular lymphoma grade IIIa, extranodal and solid organ sites: Secondary | ICD-10-CM

## 2013-12-15 LAB — GLUCOSE, CAPILLARY: GLUCOSE-CAPILLARY: 96 mg/dL (ref 70–99)

## 2013-12-15 MED ORDER — FLUDEOXYGLUCOSE F - 18 (FDG) INJECTION
9.7000 | Freq: Once | INTRAVENOUS | Status: AC | PRN
  Administered 2013-12-15: 9.7 via INTRAVENOUS

## 2013-12-17 ENCOUNTER — Ambulatory Visit (HOSPITAL_BASED_OUTPATIENT_CLINIC_OR_DEPARTMENT_OTHER): Payer: Medicare Other | Admitting: Hematology and Oncology

## 2013-12-17 ENCOUNTER — Other Ambulatory Visit (HOSPITAL_BASED_OUTPATIENT_CLINIC_OR_DEPARTMENT_OTHER): Payer: Medicare Other

## 2013-12-17 ENCOUNTER — Other Ambulatory Visit: Payer: Self-pay

## 2013-12-17 ENCOUNTER — Ambulatory Visit (HOSPITAL_BASED_OUTPATIENT_CLINIC_OR_DEPARTMENT_OTHER): Payer: Medicare Other

## 2013-12-17 ENCOUNTER — Telehealth: Payer: Self-pay | Admitting: Hematology and Oncology

## 2013-12-17 VITALS — BP 131/73 | HR 79 | Temp 97.8°F | Resp 16

## 2013-12-17 VITALS — BP 155/81 | HR 74 | Temp 98.1°F | Resp 18 | Ht 62.0 in | Wt 186.0 lb

## 2013-12-17 DIAGNOSIS — Z5112 Encounter for antineoplastic immunotherapy: Secondary | ICD-10-CM | POA: Diagnosis not present

## 2013-12-17 DIAGNOSIS — D72819 Decreased white blood cell count, unspecified: Secondary | ICD-10-CM | POA: Diagnosis not present

## 2013-12-17 DIAGNOSIS — C8299 Follicular lymphoma, unspecified, extranodal and solid organ sites: Secondary | ICD-10-CM

## 2013-12-17 DIAGNOSIS — D649 Anemia, unspecified: Secondary | ICD-10-CM

## 2013-12-17 DIAGNOSIS — C8239 Follicular lymphoma grade IIIa, extranodal and solid organ sites: Secondary | ICD-10-CM

## 2013-12-17 DIAGNOSIS — C8290 Follicular lymphoma, unspecified, unspecified site: Secondary | ICD-10-CM

## 2013-12-17 DIAGNOSIS — D630 Anemia in neoplastic disease: Secondary | ICD-10-CM

## 2013-12-17 LAB — COMPREHENSIVE METABOLIC PANEL (CC13)
ALT: 19 U/L (ref 0–55)
AST: 24 U/L (ref 5–34)
Albumin: 4 g/dL (ref 3.5–5.0)
Alkaline Phosphatase: 98 U/L (ref 40–150)
Anion Gap: 8 mEq/L (ref 3–11)
BILIRUBIN TOTAL: 0.32 mg/dL (ref 0.20–1.20)
BUN: 12.5 mg/dL (ref 7.0–26.0)
CO2: 27 mEq/L (ref 22–29)
Calcium: 9.2 mg/dL (ref 8.4–10.4)
Chloride: 107 mEq/L (ref 98–109)
Creatinine: 0.8 mg/dL (ref 0.6–1.1)
Glucose: 97 mg/dl (ref 70–140)
Potassium: 3.6 mEq/L (ref 3.5–5.1)
SODIUM: 142 meq/L (ref 136–145)
Total Protein: 6.4 g/dL (ref 6.4–8.3)

## 2013-12-17 LAB — CBC WITH DIFFERENTIAL/PLATELET
BASO%: 1.9 % (ref 0.0–2.0)
Basophils Absolute: 0.1 10*3/uL (ref 0.0–0.1)
EOS%: 7.8 % — ABNORMAL HIGH (ref 0.0–7.0)
Eosinophils Absolute: 0.3 10*3/uL (ref 0.0–0.5)
HEMATOCRIT: 32.3 % — AB (ref 34.8–46.6)
HGB: 11.1 g/dL — ABNORMAL LOW (ref 11.6–15.9)
LYMPH%: 16.2 % (ref 14.0–49.7)
MCH: 30.1 pg (ref 25.1–34.0)
MCHC: 34.4 g/dL (ref 31.5–36.0)
MCV: 87.4 fL (ref 79.5–101.0)
MONO#: 0.4 10*3/uL (ref 0.1–0.9)
MONO%: 13 % (ref 0.0–14.0)
NEUT#: 2 10*3/uL (ref 1.5–6.5)
NEUT%: 61.1 % (ref 38.4–76.8)
PLATELETS: 199 10*3/uL (ref 145–400)
RBC: 3.7 10*6/uL (ref 3.70–5.45)
RDW: 13.9 % (ref 11.2–14.5)
WBC: 3.2 10*3/uL — ABNORMAL LOW (ref 3.9–10.3)
lymph#: 0.5 10*3/uL — ABNORMAL LOW (ref 0.9–3.3)

## 2013-12-17 LAB — LACTATE DEHYDROGENASE (CC13): LDH: 191 U/L (ref 125–245)

## 2013-12-17 MED ORDER — SODIUM CHLORIDE 0.9 % IV SOLN
375.0000 mg/m2 | Freq: Once | INTRAVENOUS | Status: AC
  Administered 2013-12-17: 700 mg via INTRAVENOUS
  Filled 2013-12-17: qty 70

## 2013-12-17 MED ORDER — SODIUM CHLORIDE 0.9 % IJ SOLN
10.0000 mL | INTRAMUSCULAR | Status: DC | PRN
Start: 2013-12-17 — End: 2013-12-17
  Administered 2013-12-17: 10 mL
  Filled 2013-12-17: qty 10

## 2013-12-17 MED ORDER — SODIUM CHLORIDE 0.9 % IV SOLN: 375.0000 mg/m2 | Freq: Once | INTRAVENOUS | Status: DC

## 2013-12-17 MED ORDER — DIPHENHYDRAMINE HCL 25 MG PO CAPS
ORAL_CAPSULE | ORAL | Status: AC
  Filled 2013-12-17: qty 2

## 2013-12-17 MED ORDER — ACETAMINOPHEN 325 MG PO TABS
ORAL_TABLET | ORAL | Status: AC
  Filled 2013-12-17: qty 2

## 2013-12-17 MED ORDER — HEPARIN SOD (PORK) LOCK FLUSH 100 UNIT/ML IV SOLN
500.0000 [IU] | Freq: Once | INTRAVENOUS | Status: AC | PRN
  Administered 2013-12-17: 500 [IU]
  Filled 2013-12-17: qty 5

## 2013-12-17 MED ORDER — DIPHENHYDRAMINE HCL 25 MG PO CAPS
50.0000 mg | ORAL_CAPSULE | Freq: Once | ORAL | Status: AC
  Administered 2013-12-17: 50 mg via ORAL

## 2013-12-17 MED ORDER — ACETAMINOPHEN 325 MG PO TABS
650.0000 mg | ORAL_TABLET | Freq: Once | ORAL | Status: AC
  Administered 2013-12-17: 650 mg via ORAL

## 2013-12-17 MED ORDER — SODIUM CHLORIDE 0.9 % IV SOLN
Freq: Once | INTRAVENOUS | Status: AC
  Administered 2013-12-17: 10:00:00 via INTRAVENOUS

## 2013-12-17 NOTE — Progress Notes (Signed)
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 Needle biopsy was inconclusive.     08/02/2011 Imaging CT scan of the chest abdomen and pelvis showed numerous lymphadenopathy above and below the diaphragm.    08/16/2011 Imaging MRI of the abdomen revealed enlarging left kidney lesion worrisome for malignancy   09/20/2011 Initial Diagnosis Lymphoma, low grade from kidney biopsy, follicular   1/74/0814 Procedure Histologic type: Non-Hodgkin's lymphoma, follicular center cell type. Grade (if applicable): Favor high grade, Ki-67 ranges from 10-50%. Immunohistochemical stains: CD20, CD79a, CD10, BCL-2, BCL-6, CD21, CD3, CD43.   05/09/2013 Procedure BM biopsy is highly suspicious of involvement. Patient had persistent leukopenia   05/09/2013 Imaging PET/CT showed significant involvement of LN everywhere   07/16/2013 - 11/05/2013 Chemotherapy She completed 6 cycles of R. CHOP chemotherapy.   07/23/2013 - 07/27/2013 Hospital Admission Admitted for management of mucositis and neutropenic fever. Blood culture was positive for gram negative rods, resolved with antibiotics    10/08/2013 Adverse Reaction The dose of vincristine is reduced by 50%   12/15/2013 Imaging Repeat PET CT scan show complete response to treatment.   12/17/2013 -  Chemotherapy She begin maintenance rituximab every other month    Follicular lymphoma (Resolved)    INTERVAL HISTORY: [name] 68 y.o. female returns for further followup. She is doing very well with no side effects from last treatment. She is undergoing evaluation to see the dentist for possible dental implants in the new future. Denies any recent dizziness. Her appetite is stable no recent weight loss. She complained of very mild peripheral edema.  I have reviewed the past medical history, past surgical history, social history and family history with the patient and they are unchanged from previous note.  ALLERGIES:  is allergic to latex.  MEDICATIONS:  Current Outpatient Prescriptions  Medication Sig Dispense Refill  . acetaminophen (TYLENOL) 325 MG tablet Take 325 mg by mouth every 6 (six) hours as needed.      . Alum & Mag Hydroxide-Simeth (MAGIC MOUTHWASH W/LIDOCAINE) SOLN Take 5 mLs by mouth 4 (four) times daily.  240 mL  2  . aspirin 81 MG tablet Take 81 mg by mouth at bedtime.       Marland Kitchen CALCIUM-MAGNESIUM-ZINC PO Take by mouth daily.      . Cholecalciferol (VITAMIN D) 2000 UNITS tablet Take 2,000 Units by mouth daily.      . clobetasol cream (TEMOVATE) 4.81 % Apply 1 application topically daily. Apply to affected area      . docusate sodium (COLACE) 100 MG capsule Take 1 capsule (100 mg total) by mouth 2 (two) times daily.  100 capsule  1  .  lansoprazole (PREVACID) 30 MG capsule Take 30 mg by mouth daily.       Marland Kitchen lidocaine-prilocaine (EMLA) cream Apply topically as needed.  30 g  0  . losartan-hydrochlorothiazide (HYZAAR) 100-12.5 MG per tablet       . nystatin cream (MYCOSTATIN)       . ondansetron (ZOFRAN) 8 MG tablet Take 8 mg by mouth every 8 (eight) hours as needed for nausea or vomiting.      . potassium  chloride SA (K-DUR,KLOR-CON) 20 MEQ tablet Take 20 mEq by mouth daily.       . Probiotic Product (PROBIOTIC DAILY PO) Take 1 capsule by mouth daily.      Marland Kitchen pyridOXINE (VITAMIN B-6) 100 MG tablet Take 100 mg by mouth daily.       . Red Yeast Rice 600 MG CAPS Take 1 capsule by mouth every evening.       . triamcinolone cream (KENALOG) 0.1 %        No current facility-administered medications for this visit.    REVIEW OF SYSTEMS:   Constitutional: Denies fevers, chills or abnormal weight loss Eyes: Denies blurriness of vision Ears, nose, mouth, throat, and face: Denies mucositis or sore throat Respiratory: Denies cough, dyspnea or wheezes Cardiovascular: Denies palpitation, chest discomfort Gastrointestinal:  Denies nausea, heartburn or change in bowel habits Skin: Denies abnormal skin rashes Lymphatics: Denies new lymphadenopathy or easy bruising Neurological:Denies numbness, tingling or new weaknesses Behavioral/Psych: Mood is stable, no new changes  All other systems were reviewed with the patient and are negative.  PHYSICAL EXAMINATION: ECOG PERFORMANCE STATUS: 0 - Asymptomatic  Filed Vitals:   12/17/13 0820  BP: 155/81  Pulse: 74  Temp: 98.1 F (36.7 C)  Resp: 18   Filed Weights   12/17/13 0820  Weight: 186 lb (84.369 kg)    GENERAL:alert, no distress and comfortable SKIN: skin color, texture, turgor are normal, no rashes or significant lesions EYES: normal, Conjunctiva are pink and non-injected, sclera clear OROPHARYNX: No mucositis or thrush. Her lip is healing well.  NECK: supple, thyroid normal size, non-tender, without nodularity LYMPH:  no palpable lymphadenopathy in the cervical, axillary or inguinal LUNGS: clear to auscultation and percussion with normal breathing effort HEART: regular rate & rhythm and no murmurs and mild trace extremity edema ABDOMEN:abdomen soft, non-tender and normal bowel sounds Musculoskeletal:no cyanosis of digits and no clubbing   NEURO: alert & oriented x 3 with fluent speech, no focal motor/sensory deficits  LABORATORY DATA:  I have reviewed the data as listed    Component Value Date/Time   NA 142 12/17/2013 0800   NA 140 10/14/2013 1455   K 3.6 12/17/2013 0800   K 3.5* 10/14/2013 1455   CL 101 10/14/2013 1455   CO2 27 12/17/2013 0800   CO2 27 10/14/2013 1455   GLUCOSE 97 12/17/2013 0800   GLUCOSE 99 10/14/2013 1455   BUN 12.5 12/17/2013 0800   BUN 26* 10/14/2013 1455   CREATININE 0.8 12/17/2013 0800   CREATININE 0.91 10/14/2013 1455   CALCIUM 9.2 12/17/2013 0800   CALCIUM 9.0 10/14/2013 1455   PROT 6.4 12/17/2013 0800   PROT 5.9* 10/14/2013 1455   ALBUMIN 4.0 12/17/2013 0800   ALBUMIN 3.7 10/14/2013 1455   AST 24 12/17/2013 0800   AST 14 10/14/2013 1455   ALT 19 12/17/2013 0800   ALT 16 10/14/2013 1455   ALKPHOS 98 12/17/2013 0800   ALKPHOS 94 10/14/2013 1455   BILITOT 0.32 12/17/2013 0800   BILITOT 0.5  10/14/2013 1455   GFRNONAA 63* 10/14/2013 1455   GFRAA 73* 10/14/2013 1455    No results found for this basename: SPEP, UPEP,  kappa and lambda light chains    Lab Results  Component Value Date   WBC 3.2* 12/17/2013   NEUTROABS 2.0 12/17/2013   HGB 11.1* 12/17/2013   HCT 32.3* 12/17/2013   MCV 87.4 12/17/2013   PLT 199 12/17/2013      Chemistry      Component Value Date/Time   NA 142 12/17/2013 0800   NA 140 10/14/2013 1455   K 3.6 12/17/2013 0800   K 3.5* 10/14/2013 1455   CL 101 10/14/2013 1455   CO2 27 12/17/2013 0800   CO2 27 10/14/2013 1455   BUN 12.5 12/17/2013 0800   BUN 26* 10/14/2013 1455   CREATININE 0.8 12/17/2013 0800   CREATININE 0.91 10/14/2013 1455      Component Value Date/Time   CALCIUM 9.2 12/17/2013 0800   CALCIUM 9.0 10/14/2013 1455   ALKPHOS 98 12/17/2013 0800   ALKPHOS 94 10/14/2013 1455   AST 24 12/17/2013 0800   AST 14 10/14/2013 1455   ALT 19 12/17/2013 0800   ALT 16 10/14/2013 1455   BILITOT 0.32 12/17/2013 0800   BILITOT 0.5 10/14/2013 1455       RADIOGRAPHIC STUDIES: I reviewed the imaging study with her and her husband I have personally  reviewed the radiological images as listed and agreed with the findings in the report. Nm Pet Image Restag (ps) Skull Base To Thigh  12/15/2013   CLINICAL DATA:  Subsequent treatment strategy for follicular lymphoma.  EXAM: NUCLEAR MEDICINE PET SKULL BASE TO THIGH  TECHNIQUE: 9.7 mCi F-18 FDG was injected intravenously. Full-ring PET imaging was performed from the skull base to thigh after the radiotracer. CT data was obtained and used for attenuation correction and anatomic localization.  FASTING BLOOD GLUCOSE:  Value: 96 mg/dl  COMPARISON:  NM PET IMAGE RESTAG (PS) SKULL BASE TO THIGH dated 07/09/2013  FINDINGS: NECK  No hypermetabolic lymph nodes in the neck. CT images show no acute findings.  CHEST  No hypermetabolic mediastinal, hilar or axillary lymph nodes. No hypermetabolic pulmonary nodules.  CT images show a right IJ Port-A-Cath tip at the IVC RA junction. Heart is at the upper limits of normal in size. No pericardial or pleural effusion. Given normal respiratory motion, lungs are grossly clear. Airway is unremarkable.  ABDOMEN/PELVIS  A subcutaneous periumbilical soft tissue nodule measures 1.7 x 2.1 cm (previously 2.1 x 2.7 cm) and does show mild hypermetabolism, with an SUV max of 2.9. Residual haziness and nodularity in the small bowel mesentery with nodularity measuring up to 9 mm (CT image 107), previously 1.3 cm. Metabolism is no greater than blood pool. No hypermetabolic abdominal or pelvic adenopathy. No abnormal hypermetabolism within the liver, adrenal glands, spleen or pancreas.  CT images show the liver, gallbladder, adrenal glands and right kidney to be grossly unremarkable. Low-attenuation lesions off the left kidney measure up to 1.4 cm, unchanged. Spleen, pancreas, stomach and bowel are grossly unremarkable. Oral contrast is seen within the small bowel. Atherosclerotic calcification of the arterial vasculature without abdominal aortic aneurysm. No free fluid.  SKELETON  No hypermetabolic  osseous lesions. Degenerative changes are seen in the spine.  IMPRESSION: 1. Marked interval response to therapy as evidenced by resolution in previously seen widespread hypermetabolic adenopathy and hepatic metastatic disease. A residual periumbilical subcutaneous nodule is smaller but remains mildly hypermetabolic. 2. Right IJ Port-A-Cath tip  is at the IVC RA junction.   Electronically Signed   By: Lorin Picket M.D.   On: 12/15/2013 11:39      ASSESSMENT & PLAN:  #1 follicular lymphoma She had complete response to treatment. The very mild hypermetabolism seen in the periumbilical region and the small bowel mesentery are likely disease related but has regressed significantly. I do not see a role for bone marrow aspirate and biopsy. I recommend maintenance therapy with rituximab every other month. I discussed with the risks, benefits and side effects of treatment and she agreed to proceed. #2 anemia This is likely anemia of chronic disease. The patient denies recent history of bleeding such as epistaxis, hematuria or hematochezia. She is asymptomatic from the anemia. We will observe for now.  She does not require transfusion now.  #3 leukopenia This is likely due to recent treatment. The patient denies recent history of fevers, cough, chills, diarrhea or dysuria. She is asymptomatic from the leukopenia. I will observe for now.  I will continue the chemotherapy at current dose without dosage adjustment.  #4 history of peripheral neuropathy This will continue to improve. Recommend observation.  All questions were answered. The patient knows to call the clinic with any problems, questions or concerns. No barriers to learning was detected.    Heath Lark, MD 12/17/2013 9:13 AM

## 2013-12-17 NOTE — Telephone Encounter (Signed)
gv pt appt schedule for june/august °

## 2013-12-17 NOTE — Patient Instructions (Signed)
[facility] Discharge Instructions for Patients Receiving Chemotherapy  Today you received the following chemotherapy agents:  Rituxan  To help prevent nausea and vomiting after your treatment, we encourage you to take your nausea medication as ordered per MD.   If you develop nausea and vomiting that is not controlled by your nausea medication, call the clinic.   BELOW ARE SYMPTOMS THAT SHOULD BE REPORTED IMMEDIATELY:  *FEVER GREATER THAN 100.5 F  *CHILLS WITH OR WITHOUT FEVER  NAUSEA AND VOMITING THAT IS NOT CONTROLLED WITH YOUR NAUSEA MEDICATION  *UNUSUAL SHORTNESS OF BREATH  *UNUSUAL BRUISING OR BLEEDING  TENDERNESS IN MOUTH AND THROAT WITH OR WITHOUT PRESENCE OF ULCERS  *URINARY PROBLEMS  *BOWEL PROBLEMS  UNUSUAL RASH Items with * indicate a potential emergency and should be followed up as soon as possible.  Feel free to call the clinic you have any questions or concerns. The clinic phone number is [phone].    

## 2013-12-31 ENCOUNTER — Telehealth: Payer: Self-pay | Admitting: Hematology and Oncology

## 2013-12-31 NOTE — Telephone Encounter (Signed)
returned pt call and lvm confirming pt appt in June

## 2014-01-20 DIAGNOSIS — L609 Nail disorder, unspecified: Secondary | ICD-10-CM | POA: Diagnosis not present

## 2014-02-13 ENCOUNTER — Telehealth: Payer: Self-pay | Admitting: *Deleted

## 2014-02-13 NOTE — Telephone Encounter (Signed)
Pt states she has a mole on her left leg that has become swollen and is "white all around it". Does Dr Alvy Bimler want to see her sooner than Wednesday of next week?

## 2014-02-18 ENCOUNTER — Telehealth: Payer: Self-pay | Admitting: Hematology and Oncology

## 2014-02-18 ENCOUNTER — Other Ambulatory Visit (HOSPITAL_BASED_OUTPATIENT_CLINIC_OR_DEPARTMENT_OTHER): Payer: Medicare Other

## 2014-02-18 ENCOUNTER — Ambulatory Visit (HOSPITAL_BASED_OUTPATIENT_CLINIC_OR_DEPARTMENT_OTHER): Payer: Medicare Other | Admitting: Hematology and Oncology

## 2014-02-18 ENCOUNTER — Ambulatory Visit (HOSPITAL_BASED_OUTPATIENT_CLINIC_OR_DEPARTMENT_OTHER): Payer: Medicare Other

## 2014-02-18 ENCOUNTER — Encounter: Payer: Self-pay | Admitting: Hematology and Oncology

## 2014-02-18 VITALS — BP 152/88 | HR 68 | Temp 97.5°F | Resp 20

## 2014-02-18 VITALS — BP 148/86 | HR 77 | Temp 97.8°F | Resp 20 | Ht 62.0 in | Wt 190.5 lb

## 2014-02-18 DIAGNOSIS — C8299 Follicular lymphoma, unspecified, extranodal and solid organ sites: Secondary | ICD-10-CM

## 2014-02-18 DIAGNOSIS — R600 Localized edema: Secondary | ICD-10-CM | POA: Insufficient documentation

## 2014-02-18 DIAGNOSIS — D72819 Decreased white blood cell count, unspecified: Secondary | ICD-10-CM

## 2014-02-18 DIAGNOSIS — L989 Disorder of the skin and subcutaneous tissue, unspecified: Secondary | ICD-10-CM

## 2014-02-18 DIAGNOSIS — Z5112 Encounter for antineoplastic immunotherapy: Secondary | ICD-10-CM

## 2014-02-18 DIAGNOSIS — R609 Edema, unspecified: Secondary | ICD-10-CM

## 2014-02-18 DIAGNOSIS — C8239 Follicular lymphoma grade IIIa, extranodal and solid organ sites: Secondary | ICD-10-CM

## 2014-02-18 HISTORY — DX: Disorder of the skin and subcutaneous tissue, unspecified: L98.9

## 2014-02-18 LAB — CBC WITH DIFFERENTIAL/PLATELET
BASO%: 1.3 % (ref 0.0–2.0)
BASOS ABS: 0 10*3/uL (ref 0.0–0.1)
EOS ABS: 0.1 10*3/uL (ref 0.0–0.5)
EOS%: 3.8 % (ref 0.0–7.0)
HCT: 36.6 % (ref 34.8–46.6)
HEMOGLOBIN: 12.2 g/dL (ref 11.6–15.9)
LYMPH#: 0.5 10*3/uL — AB (ref 0.9–3.3)
LYMPH%: 15.6 % (ref 14.0–49.7)
MCH: 27.7 pg (ref 25.1–34.0)
MCHC: 33.3 g/dL (ref 31.5–36.0)
MCV: 83.3 fL (ref 79.5–101.0)
MONO#: 0.3 10*3/uL (ref 0.1–0.9)
MONO%: 11.2 % (ref 0.0–14.0)
NEUT%: 68.1 % (ref 38.4–76.8)
NEUTROS ABS: 2.1 10*3/uL (ref 1.5–6.5)
Platelets: 202 10*3/uL (ref 145–400)
RBC: 4.4 10*6/uL (ref 3.70–5.45)
RDW: 14 % (ref 11.2–14.5)
WBC: 3.1 10*3/uL — ABNORMAL LOW (ref 3.9–10.3)

## 2014-02-18 LAB — COMPREHENSIVE METABOLIC PANEL (CC13)
ALT: 36 U/L (ref 0–55)
ANION GAP: 9 meq/L (ref 3–11)
AST: 26 U/L (ref 5–34)
Albumin: 3.8 g/dL (ref 3.5–5.0)
Alkaline Phosphatase: 98 U/L (ref 40–150)
BUN: 16.2 mg/dL (ref 7.0–26.0)
CALCIUM: 9.2 mg/dL (ref 8.4–10.4)
CO2: 27 mEq/L (ref 22–29)
CREATININE: 1.1 mg/dL (ref 0.6–1.1)
Chloride: 107 mEq/L (ref 98–109)
Glucose: 76 mg/dl (ref 70–140)
Potassium: 3.7 mEq/L (ref 3.5–5.1)
Sodium: 143 mEq/L (ref 136–145)
Total Bilirubin: 0.47 mg/dL (ref 0.20–1.20)
Total Protein: 6.4 g/dL (ref 6.4–8.3)

## 2014-02-18 LAB — LACTATE DEHYDROGENASE (CC13): LDH: 183 U/L (ref 125–245)

## 2014-02-18 MED ORDER — DIPHENHYDRAMINE HCL 25 MG PO CAPS
50.0000 mg | ORAL_CAPSULE | Freq: Once | ORAL | Status: AC
  Administered 2014-02-18: 50 mg via ORAL

## 2014-02-18 MED ORDER — ACETAMINOPHEN 325 MG PO TABS
650.0000 mg | ORAL_TABLET | Freq: Once | ORAL | Status: AC
  Administered 2014-02-18: 650 mg via ORAL

## 2014-02-18 MED ORDER — HEPARIN SOD (PORK) LOCK FLUSH 100 UNIT/ML IV SOLN
500.0000 [IU] | Freq: Once | INTRAVENOUS | Status: AC | PRN
  Administered 2014-02-18: 500 [IU]
  Filled 2014-02-18: qty 5

## 2014-02-18 MED ORDER — DIPHENHYDRAMINE HCL 25 MG PO CAPS
ORAL_CAPSULE | ORAL | Status: AC
  Filled 2014-02-18: qty 1

## 2014-02-18 MED ORDER — ACETAMINOPHEN 325 MG PO TABS
ORAL_TABLET | ORAL | Status: AC
  Filled 2014-02-18: qty 2

## 2014-02-18 MED ORDER — RITUXIMAB CHEMO INJECTION 10 MG/ML
375.0000 mg/m2 | Freq: Once | INTRAVENOUS | Status: AC
  Administered 2014-02-18: 700 mg via INTRAVENOUS
  Filled 2014-02-18: qty 70

## 2014-02-18 MED ORDER — SODIUM CHLORIDE 0.9 % IV SOLN
Freq: Once | INTRAVENOUS | Status: AC
  Administered 2014-02-18: 09:00:00 via INTRAVENOUS

## 2014-02-18 MED ORDER — DIPHENHYDRAMINE HCL 25 MG PO CAPS
ORAL_CAPSULE | ORAL | Status: AC
  Filled 2014-02-18: qty 2

## 2014-02-18 MED ORDER — SODIUM CHLORIDE 0.9 % IJ SOLN
10.0000 mL | INTRAMUSCULAR | Status: DC | PRN
  Administered 2014-02-18: 10 mL
  Filled 2014-02-18: qty 10

## 2014-02-18 NOTE — Telephone Encounter (Signed)
gv adn printed appt sched anda vs for pt for Aug °

## 2014-02-18 NOTE — Assessment & Plan Note (Signed)
This is related to side effects of treatment. She is not symptomatic. Observed only. 

## 2014-02-18 NOTE — Patient Instructions (Signed)
[facility] Discharge Instructions for Patients Receiving Chemotherapy  Today you received the following chemotherapy agents: rituxan  To help prevent nausea and vomiting after your treatment, we encourage you to take your nausea medication.  Take it as often as prescribed.     If you develop nausea and vomiting that is not controlled by your nausea medication, call the clinic. If it is after clinic hours your family physician or the after hours number for the clinic or go to the Emergency Department.   BELOW ARE SYMPTOMS THAT SHOULD BE REPORTED IMMEDIATELY:  *FEVER GREATER THAN 100.5 F  *CHILLS WITH OR WITHOUT FEVER  NAUSEA AND VOMITING THAT IS NOT CONTROLLED WITH YOUR NAUSEA MEDICATION  *UNUSUAL SHORTNESS OF BREATH  *UNUSUAL BRUISING OR BLEEDING  TENDERNESS IN MOUTH AND THROAT WITH OR WITHOUT PRESENCE OF ULCERS  *URINARY PROBLEMS  *BOWEL PROBLEMS  UNUSUAL RASH Items with * indicate a potential emergency and should be followed up as soon as possible.  Feel free to call the clinic you have any questions or concerns. The clinic phone number is [phone].   I have been informed and understand all the instructions given to me. I know to contact the clinic, my physician, or go to the Emergency Department if any problems should occur. I do not have any questions at this time, but understand that I may call the clinic during office hours   should I have any questions or need assistance in obtaining follow up care.    __________________________________________  _____________  __________ Signature of Patient or Authorized Representative            Date                   Time    __________________________________________ Nurse's Signature    

## 2014-02-18 NOTE — Progress Notes (Signed)
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 Needle biopsy was inconclusive.     08/02/2011 Imaging CT scan of the chest abdomen and pelvis showed numerous lymphadenopathy above and below the diaphragm.    08/16/2011 Imaging MRI of the abdomen revealed enlarging left kidney lesion worrisome for malignancy   09/20/2011 Initial Diagnosis Lymphoma, low grade from kidney biopsy, follicular   5/78/4696 Procedure Histologic type: Non-Hodgkin's lymphoma, follicular center cell type. Grade (if applicable): Favor high grade, Ki-67 ranges from 10-50%. Immunohistochemical stains: CD20, CD79a, CD10, BCL-2, BCL-6, CD21, CD3, CD43.   05/09/2013 Procedure BM biopsy is highly suspicious of involvement. Patient had persistent leukopenia   05/09/2013 Imaging PET/CT showed significant involvement of LN everywhere   07/16/2013 - 11/05/2013 Chemotherapy She completed 6 cycles of R. CHOP chemotherapy.   07/23/2013 - 07/27/2013 Hospital Admission Admitted for management of mucositis and neutropenic fever. Blood culture was positive for gram negative rods, resolved with antibiotics   10/08/2013 Adverse Reaction The dose of vincristine is reduced  by 50%   12/15/2013 Imaging Repeat PET CT scan show complete response to treatment.   12/17/2013 -  Chemotherapy She begin maintenance rituximab every other month    Follicular lymphoma (Resolved)    INTERVAL HISTORY: Please see below for problem oriented charting. Her only complaints include some mild edema on her leg and a new skin lesion on the left calf.  REVIEW OF SYSTEMS:   Constitutional: Denies fevers, chills or abnormal weight loss Eyes: Denies blurriness of vision Ears, nose, mouth, throat, and face: Denies mucositis or sore throat Respiratory: Denies cough, dyspnea or wheezes Cardiovascular: Denies palpitation, chest discomfort  Gastrointestinal:  Denies nausea, heartburn or change in bowel habits Lymphatics: Denies new lymphadenopathy or easy bruising Neurological:Denies numbness, tingling or new weaknesses Behavioral/Psych: Mood is stable, no new changes  All other systems were reviewed with the patient and are negative.  I have reviewed the past medical history, past surgical history, social history and family history with the patient and they are unchanged from previous note.  ALLERGIES:  is allergic to latex.  MEDICATIONS:  Current Outpatient Prescriptions  Medication Sig Dispense Refill  . acetaminophen (TYLENOL) 325 MG tablet Take 325 mg by mouth every 6 (six) hours as needed.      . Alum & Mag Hydroxide-Simeth (MAGIC MOUTHWASH W/LIDOCAINE) SOLN Take 5 mLs by mouth 4 (four) times daily.  240 mL  2  . aspirin 81 MG tablet Take 81 mg by mouth at bedtime.       Marland Kitchen CALCIUM-MAGNESIUM-ZINC PO Take by mouth daily.      . Cholecalciferol (VITAMIN D) 2000 UNITS tablet Take 2,000 Units by mouth daily.      Marland Kitchen  clobetasol cream (TEMOVATE) 4.09 % Apply 1 application topically daily. Apply to affected area      . docusate sodium (COLACE) 100 MG capsule Take 1 capsule (100 mg total) by mouth 2 (two) times daily.  100 capsule  1  . lansoprazole (PREVACID) 30 MG capsule Take 30 mg by  mouth daily.       Marland Kitchen lidocaine-prilocaine (EMLA) cream Apply topically as needed.  30 g  0  . losartan-hydrochlorothiazide (HYZAAR) 100-12.5 MG per tablet       . nystatin cream (MYCOSTATIN)       . potassium chloride SA (K-DUR,KLOR-CON) 20 MEQ tablet Take 20 mEq by mouth daily.       . Probiotic Product (PROBIOTIC DAILY PO) Take 1 capsule by mouth daily.      Marland Kitchen pyridOXINE (VITAMIN B-6) 100 MG tablet Take 100 mg by mouth daily.       . Red Yeast Rice 600 MG CAPS Take 1 capsule by mouth every evening.       . triamcinolone cream (KENALOG) 0.1 %       . ondansetron (ZOFRAN) 8 MG tablet Take 8 mg by mouth every 8 (eight) hours as needed for nausea or vomiting.       No current facility-administered medications for this visit.   Facility-Administered Medications Ordered in Other Visits  Medication Dose Route Frequency Provider Last Rate Last Dose  . sodium chloride 0.9 % injection 10 mL  10 mL Intracatheter PRN Heath Lark, MD   10 mL at 02/18/14 1135    PHYSICAL EXAMINATION: ECOG PERFORMANCE STATUS: 1 - Symptomatic but completely ambulatory  Filed Vitals:   02/18/14 0823  BP: 148/86  Pulse: 77  Temp: 97.8 F (36.6 C)  Resp: 20   Filed Weights   02/18/14 0823  Weight: 190 lb 8 oz (86.41 kg)    GENERAL:alert, no distress and comfortable. She appears overweight SKIN: There is a suspicious lesion on her left calf. EYES: normal, Conjunctiva are pink and non-injected, sclera clear OROPHARYNX:no exudate, no erythema and lips, buccal mucosa, and tongue normal  NECK: supple, thyroid normal size, non-tender, without nodularity LYMPH:  no palpable lymphadenopathy in the cervical, axillary or inguinal LUNGS: clear to auscultation and percussion with normal breathing effort HEART: regular rate & rhythm and no murmurs with mild bilateral lower extremity edema ABDOMEN:abdomen soft, non-tender and normal bowel sounds Musculoskeletal:no cyanosis of digits and no clubbing  NEURO: alert &  oriented x 3 with fluent speech, no focal motor/sensory deficits  LABORATORY DATA:  I have reviewed the data as listed    Component Value Date/Time   NA 143 02/18/2014 0813   NA 140 10/14/2013 1455   K 3.7 02/18/2014 0813   K 3.5* 10/14/2013 1455   CL 101 10/14/2013 1455   CO2 27 02/18/2014 0813   CO2 27 10/14/2013 1455   GLUCOSE 76 02/18/2014 0813   GLUCOSE 99 10/14/2013 1455   BUN 16.2 02/18/2014 0813   BUN 26* 10/14/2013 1455   CREATININE 1.1 02/18/2014 0813   CREATININE 0.91 10/14/2013 1455   CALCIUM 9.2 02/18/2014 0813   CALCIUM 9.0 10/14/2013 1455   PROT 6.4 02/18/2014 0813   PROT 5.9* 10/14/2013 1455   ALBUMIN 3.8 02/18/2014 0813   ALBUMIN 3.7 10/14/2013 1455   AST 26 02/18/2014 0813   AST 14 10/14/2013 1455   ALT 36 02/18/2014 0813   ALT 16 10/14/2013 1455   ALKPHOS 98 02/18/2014 0813   ALKPHOS 94 10/14/2013 1455   BILITOT  0.47 02/18/2014 0813   BILITOT 0.5 10/14/2013 1455   GFRNONAA 63* 10/14/2013 1455   GFRAA 73* 10/14/2013 1455    No results found for this basename: SPEP, UPEP,  kappa and lambda light chains    Lab Results  Component Value Date   WBC 3.1* 02/18/2014   NEUTROABS 2.1 02/18/2014   HGB 12.2 02/18/2014   HCT 36.6 02/18/2014   MCV 83.3 02/18/2014   PLT 202 02/18/2014      Chemistry      Component Value Date/Time   NA 143 02/18/2014 0813   NA 140 10/14/2013 1455   K 3.7 02/18/2014 0813   K 3.5* 10/14/2013 1455   CL 101 10/14/2013 1455   CO2 27 02/18/2014 0813   CO2 27 10/14/2013 1455   BUN 16.2 02/18/2014 0813   BUN 26* 10/14/2013 1455   CREATININE 1.1 02/18/2014 0813   CREATININE 0.91 10/14/2013 1455      Component Value Date/Time   CALCIUM 9.2 02/18/2014 0813   CALCIUM 9.0 10/14/2013 1455   ALKPHOS 98 02/18/2014 0813   ALKPHOS 94 10/14/2013 1455   AST 26 02/18/2014 0813   AST 14 10/14/2013 1455   ALT 36 02/18/2014 0813   ALT 16 10/14/2013 1455   BILITOT 0.47 02/18/2014 0813   BILITOT 0.5 10/14/2013 1455     ASSESSMENT & PLAN:  Follicular lymphoma grade IIIa of extranodal and solid organ  sites Clinically, she has no evidence of recurrence. I recommend we proceed with maintain and therapy. I plan to order imaging study only if she has signs and symptoms to suggest disease recurrence.  Leukopenia This is related to side effects of treatment. She is not symptomatic. Observed only.  Edema This is due to fluid retention. Recommend increase mobility and reduced salt intake.  Skin lesion of left leg This is suspicious for melanoma. I recommend referral back to dermatologist for resection.   No orders of the defined types were placed in this encounter.   All questions were answered. The patient knows to call the clinic with any problems, questions or concerns. No barriers to learning was detected. I spent 25 minutes counseling the patient face to face. The total time spent in the appointment was 30 minutes and more than 50% was on counseling and review of test results     [facility], [place], MD 02/18/2014 1:26 PM

## 2014-02-18 NOTE — Assessment & Plan Note (Signed)
Clinically, she has no evidence of recurrence. I recommend we proceed with maintain and therapy. I plan to order imaging study only if she has signs and symptoms to suggest disease recurrence.

## 2014-02-18 NOTE — Assessment & Plan Note (Signed)
This is suspicious for melanoma. I recommend referral back to dermatologist for resection.

## 2014-02-18 NOTE — Assessment & Plan Note (Signed)
This is due to fluid retention. Recommend increase mobility and reduced salt intake.

## 2014-02-23 ENCOUNTER — Telehealth: Payer: Self-pay | Admitting: *Deleted

## 2014-02-23 ENCOUNTER — Encounter: Payer: Self-pay | Admitting: Hematology and Oncology

## 2014-02-23 ENCOUNTER — Other Ambulatory Visit: Payer: Self-pay

## 2014-02-23 DIAGNOSIS — C44721 Squamous cell carcinoma of skin of unspecified lower limb, including hip: Secondary | ICD-10-CM | POA: Diagnosis not present

## 2014-02-23 DIAGNOSIS — D2360 Other benign neoplasm of skin of unspecified upper limb, including shoulder: Secondary | ICD-10-CM | POA: Diagnosis not present

## 2014-02-23 DIAGNOSIS — Z1231 Encounter for screening mammogram for malignant neoplasm of breast: Secondary | ICD-10-CM

## 2014-02-23 DIAGNOSIS — L821 Other seborrheic keratosis: Secondary | ICD-10-CM | POA: Diagnosis not present

## 2014-02-23 NOTE — Telephone Encounter (Signed)
Per POF I have adjusted treatment time

## 2014-02-23 NOTE — Progress Notes (Signed)
Patient wants copy of medical records. I am sending today and verified the address to mail.

## 2014-03-02 ENCOUNTER — Ambulatory Visit
Admission: RE | Admit: 2014-03-02 | Discharge: 2014-03-02 | Disposition: A | Discharge status: Home / Self Care | Payer: Medicare Other | Source: Ambulatory Visit

## 2014-03-02 DIAGNOSIS — Z1231 Encounter for screening mammogram for malignant neoplasm of breast: Secondary | ICD-10-CM

## 2014-03-16 ENCOUNTER — Telehealth: Payer: Self-pay | Admitting: *Deleted

## 2014-03-16 NOTE — Telephone Encounter (Signed)
Pt had a mole removed from left leg about 3 weeks ago.  Says it is "low grade cancer."  States the area it was removed is bothering her and she asks if Dr. Alvy Bimler needs to see it?  Instructed pt to call Dermatologist who removed it to look at it as soon as possible.  Asked her to have Dermatologist fax [place] Pathology results.  Keep appt w/ Dr. Alvy Bimler as scheduled on 8/08.  Pt verbalized understanding and will call Dermatologist.

## 2014-03-17 DIAGNOSIS — M1710 Unilateral primary osteoarthritis, unspecified knee: Secondary | ICD-10-CM | POA: Diagnosis not present

## 2014-03-24 ENCOUNTER — Encounter: Payer: Self-pay | Admitting: Hematology and Oncology

## 2014-03-24 ENCOUNTER — Telehealth: Payer: Self-pay | Admitting: Hematology and Oncology

## 2014-03-24 DIAGNOSIS — I890 Lymphedema, not elsewhere classified: Secondary | ICD-10-CM | POA: Diagnosis not present

## 2014-03-24 DIAGNOSIS — I10 Essential (primary) hypertension: Secondary | ICD-10-CM | POA: Diagnosis not present

## 2014-03-24 DIAGNOSIS — E785 Hyperlipidemia, unspecified: Secondary | ICD-10-CM | POA: Diagnosis not present

## 2014-03-24 DIAGNOSIS — C44729 Squamous cell carcinoma of skin of left lower limb, including hip: Secondary | ICD-10-CM

## 2014-03-24 HISTORY — DX: Squamous cell carcinoma of skin of left lower limb, including hip: C44.729

## 2014-03-24 NOTE — Telephone Encounter (Signed)
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 The patient informed me that she has an appointment pending to see her dermatologist for possible further surgery.

## 2014-04-02 DIAGNOSIS — Z85828 Personal history of other malignant neoplasm of skin: Secondary | ICD-10-CM | POA: Diagnosis not present

## 2014-04-02 DIAGNOSIS — T07XXXA Unspecified multiple injuries, initial encounter: Secondary | ICD-10-CM | POA: Diagnosis not present

## 2014-04-03 DIAGNOSIS — Q828 Other specified congenital malformations of skin: Secondary | ICD-10-CM | POA: Diagnosis not present

## 2014-04-03 DIAGNOSIS — L609 Nail disorder, unspecified: Secondary | ICD-10-CM | POA: Diagnosis not present

## 2014-04-15 ENCOUNTER — Ambulatory Visit (HOSPITAL_BASED_OUTPATIENT_CLINIC_OR_DEPARTMENT_OTHER): Payer: Medicare Other

## 2014-04-15 ENCOUNTER — Telehealth: Payer: Self-pay | Admitting: Hematology and Oncology

## 2014-04-15 ENCOUNTER — Encounter: Payer: Self-pay | Admitting: Hematology and Oncology

## 2014-04-15 ENCOUNTER — Ambulatory Visit (HOSPITAL_BASED_OUTPATIENT_CLINIC_OR_DEPARTMENT_OTHER): Payer: Medicare Other | Admitting: Hematology and Oncology

## 2014-04-15 ENCOUNTER — Other Ambulatory Visit (HOSPITAL_BASED_OUTPATIENT_CLINIC_OR_DEPARTMENT_OTHER): Payer: Medicare Other

## 2014-04-15 VITALS — BP 145/80 | HR 66 | Temp 97.8°F | Resp 18 | Ht 62.0 in | Wt 186.7 lb

## 2014-04-15 VITALS — BP 135/81 | HR 68 | Temp 98.2°F | Resp 18

## 2014-04-15 DIAGNOSIS — C44729 Squamous cell carcinoma of skin of left lower limb, including hip: Secondary | ICD-10-CM

## 2014-04-15 DIAGNOSIS — C44721 Squamous cell carcinoma of skin of unspecified lower limb, including hip: Secondary | ICD-10-CM

## 2014-04-15 DIAGNOSIS — C8239 Follicular lymphoma grade IIIa, extranodal and solid organ sites: Secondary | ICD-10-CM

## 2014-04-15 DIAGNOSIS — C8299 Follicular lymphoma, unspecified, extranodal and solid organ sites: Secondary | ICD-10-CM

## 2014-04-15 DIAGNOSIS — Z5112 Encounter for antineoplastic immunotherapy: Secondary | ICD-10-CM | POA: Diagnosis not present

## 2014-04-15 DIAGNOSIS — D72819 Decreased white blood cell count, unspecified: Secondary | ICD-10-CM

## 2014-04-15 LAB — COMPREHENSIVE METABOLIC PANEL (CC13)
ALT: 22 U/L (ref 0–55)
ANION GAP: 9 meq/L (ref 3–11)
AST: 25 U/L (ref 5–34)
Albumin: 4 g/dL (ref 3.5–5.0)
Alkaline Phosphatase: 96 U/L (ref 40–150)
BUN: 24.6 mg/dL (ref 7.0–26.0)
CHLORIDE: 105 meq/L (ref 98–109)
CO2: 26 meq/L (ref 22–29)
Calcium: 9.4 mg/dL (ref 8.4–10.4)
Creatinine: 1.2 mg/dL — ABNORMAL HIGH (ref 0.6–1.1)
GLUCOSE: 97 mg/dL (ref 70–140)
Potassium: 4.1 mEq/L (ref 3.5–5.1)
SODIUM: 140 meq/L (ref 136–145)
Total Bilirubin: 0.46 mg/dL (ref 0.20–1.20)
Total Protein: 6.7 g/dL (ref 6.4–8.3)

## 2014-04-15 LAB — CBC WITH DIFFERENTIAL/PLATELET
BASO%: 2.3 % — AB (ref 0.0–2.0)
Basophils Absolute: 0.1 10*3/uL (ref 0.0–0.1)
EOS%: 5.2 % (ref 0.0–7.0)
Eosinophils Absolute: 0.2 10*3/uL (ref 0.0–0.5)
HCT: 38.3 % (ref 34.8–46.6)
HGB: 12.6 g/dL (ref 11.6–15.9)
LYMPH%: 19.6 % (ref 14.0–49.7)
MCH: 27 pg (ref 25.1–34.0)
MCHC: 33 g/dL (ref 31.5–36.0)
MCV: 82 fL (ref 79.5–101.0)
MONO#: 0.3 10*3/uL (ref 0.1–0.9)
MONO%: 11.1 % (ref 0.0–14.0)
NEUT#: 1.9 10*3/uL (ref 1.5–6.5)
NEUT%: 61.8 % (ref 38.4–76.8)
Platelets: 202 10*3/uL (ref 145–400)
RBC: 4.67 10*6/uL (ref 3.70–5.45)
RDW: 15.8 % — ABNORMAL HIGH (ref 11.2–14.5)
WBC: 3.1 10*3/uL — AB (ref 3.9–10.3)
lymph#: 0.6 10*3/uL — ABNORMAL LOW (ref 0.9–3.3)

## 2014-04-15 LAB — LACTATE DEHYDROGENASE (CC13): LDH: 200 U/L (ref 125–245)

## 2014-04-15 MED ORDER — ACETAMINOPHEN 325 MG PO TABS
ORAL_TABLET | ORAL | Status: AC
  Filled 2014-04-15: qty 2

## 2014-04-15 MED ORDER — SODIUM CHLORIDE 0.9 % IV SOLN
Freq: Once | INTRAVENOUS | Status: AC
  Administered 2014-04-15: 09:00:00 via INTRAVENOUS

## 2014-04-15 MED ORDER — HEPARIN SOD (PORK) LOCK FLUSH 100 UNIT/ML IV SOLN
500.0000 [IU] | Freq: Once | INTRAVENOUS | Status: AC | PRN
  Administered 2014-04-15: 500 [IU]
  Filled 2014-04-15: qty 5

## 2014-04-15 MED ORDER — SODIUM CHLORIDE 0.9 % IJ SOLN
10.0000 mL | INTRAMUSCULAR | Status: DC | PRN
  Administered 2014-04-15: 10 mL
  Filled 2014-04-15: qty 10

## 2014-04-15 MED ORDER — DIPHENHYDRAMINE HCL 25 MG PO CAPS
ORAL_CAPSULE | ORAL | Status: AC
  Filled 2014-04-15: qty 2

## 2014-04-15 MED ORDER — DIPHENHYDRAMINE HCL 25 MG PO CAPS
50.0000 mg | ORAL_CAPSULE | Freq: Once | ORAL | Status: AC
  Administered 2014-04-15: 50 mg via ORAL

## 2014-04-15 MED ORDER — ACETAMINOPHEN 325 MG PO TABS
650.0000 mg | ORAL_TABLET | Freq: Once | ORAL | Status: AC
  Administered 2014-04-15: 650 mg via ORAL

## 2014-04-15 MED ORDER — SODIUM CHLORIDE 0.9 % IV SOLN
375.0000 mg/m2 | Freq: Once | INTRAVENOUS | Status: AC
  Administered 2014-04-15: 700 mg via INTRAVENOUS
  Filled 2014-04-15: qty 70

## 2014-04-15 NOTE — Assessment & Plan Note (Signed)
Clinically, she has no evidence of recurrence. I recommend we proceed with maintenance therapy. I plan to order imaging study only if she has signs and symptoms to suggest disease recurrence.

## 2014-04-15 NOTE — Patient Instructions (Signed)
[facility] Discharge Instructions for Patients Receiving Chemotherapy  Today you received the following chemotherapy agent Rituxan.  To help prevent nausea and vomiting after your treatment, we encourage you to take your nausea medication as prescribed by your physician.  If you develop nausea and vomiting that is not controlled by your nausea medication, call the clinic.   BELOW ARE SYMPTOMS THAT SHOULD BE REPORTED IMMEDIATELY:  *FEVER GREATER THAN 100.5 F  *CHILLS WITH OR WITHOUT FEVER  NAUSEA AND VOMITING THAT IS NOT CONTROLLED WITH YOUR NAUSEA MEDICATION  *UNUSUAL SHORTNESS OF BREATH  *UNUSUAL BRUISING OR BLEEDING  TENDERNESS IN MOUTH AND THROAT WITH OR WITHOUT PRESENCE OF ULCERS  *URINARY PROBLEMS  *BOWEL PROBLEMS  UNUSUAL RASH Items with * indicate a potential emergency and should be followed up as soon as possible.  Feel free to call the clinic you have any questions or concerns. The clinic phone number is [phone].

## 2014-04-15 NOTE — Assessment & Plan Note (Signed)
She has a second opinion pending whether she should undergo Mohs procedure.

## 2014-04-15 NOTE — Progress Notes (Signed)
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 Needle biopsy was inconclusive.     08/02/2011 Imaging CT scan of the chest abdomen and pelvis showed numerous lymphadenopathy above and below the diaphragm.    08/16/2011 Imaging MRI of the abdomen revealed enlarging left kidney lesion worrisome for malignancy   09/20/2011 Initial Diagnosis Lymphoma, low grade from kidney biopsy, follicular   7/32/2025 Procedure Histologic type: Non-Hodgkin's lymphoma, follicular center cell type. Grade (if applicable): Favor high grade, Ki-67 ranges from 10-50%. Immunohistochemical stains: CD20, CD79a, CD10, BCL-2, BCL-6, CD21, CD3, CD43.   05/09/2013 Procedure BM biopsy is highly suspicious of involvement. Patient had persistent leukopenia   05/09/2013 Imaging PET/CT showed significant involvement of LN everywhere   07/16/2013 - 11/05/2013 Chemotherapy She completed 6 cycles of R. CHOP chemotherapy.   07/23/2013 - 07/27/2013 Hospital Admission Admitted for management of mucositis and neutropenic fever. Blood culture was positive for gram negative rods, resolved with antibiotics   10/08/2013 Adverse Reaction The dose of vincristine is reduced  by 50%   12/15/2013 Imaging Repeat PET CT scan show complete response to treatment.   12/17/2013 -  Chemotherapy She begin maintenance rituximab every other month   02/23/2014 Procedure She had shave biopsy of the left upper, that came back well-differentiated squamous cell carcinoma.    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    INTERVAL HISTORY: Please see below for problem oriented charting. She returns today for maintenance chemotherapy. She had surgery to her leg recently and was diagnosed with skin cancer. The site of surgical resection did not bother her. REVIEW OF SYSTEMS:   Constitutional: Denies fevers, chills or abnormal weight loss Eyes: Denies blurriness of vision Ears, nose, mouth, throat, and face: Denies mucositis or sore throat Respiratory: Denies cough, dyspnea or wheezes Cardiovascular: Denies palpitation, chest discomfort or lower extremity swelling Gastrointestinal:  Denies nausea, heartburn or change in bowel habits Lymphatics: Denies new lymphadenopathy or easy bruising Neurological:Denies numbness, tingling or new weaknesses Behavioral/Psych: Mood is stable, no new changes  All other systems were reviewed with the patient and are negative.  I have reviewed the past medical history, past surgical history, social history and family history with the patient and they are unchanged from previous note.  ALLERGIES:  is allergic to latex.  MEDICATIONS:  Current Outpatient Prescriptions  Medication Sig Dispense Refill  . acetaminophen (TYLENOL) 325 MG tablet Take 325 mg by mouth every 6 (six) hours as  needed.      . Alum & Mag Hydroxide-Simeth (MAGIC MOUTHWASH W/LIDOCAINE) SOLN Take  5 mLs by mouth 4 (four) times daily.  240 mL  2  . aspirin 81 MG tablet Take 81 mg by mouth at bedtime.       Marland Kitchen CALCIUM-MAGNESIUM-ZINC PO Take by mouth daily.      . Cholecalciferol (VITAMIN D) 2000 UNITS tablet Take 2,000 Units by mouth daily.      . clobetasol cream (TEMOVATE) 5.42 % Apply 1 application topically daily. Apply to affected area      . docusate sodium (COLACE) 100 MG capsule Take 1 capsule (100 mg total) by mouth 2 (two) times daily.  100 capsule  1  . lansoprazole (PREVACID) 30 MG capsule Take 30 mg by mouth daily.       Marland Kitchen lidocaine-prilocaine (EMLA) cream Apply topically as needed.  30 g  0  . losartan-hydrochlorothiazide (HYZAAR) 100-12.5 MG per tablet       . mupirocin ointment (BACTROBAN) 2 % Apply topically as directed. To incision three times daily as needed      . nystatin cream (MYCOSTATIN)       . ondansetron (ZOFRAN) 8 MG tablet Take 8 mg by mouth every 8 (eight) hours as needed for nausea or vomiting.      . potassium chloride SA (K-DUR,KLOR-CON) 20 MEQ tablet Take 20 mEq by mouth daily.       . Probiotic Product (PROBIOTIC DAILY PO) Take 1 capsule by mouth daily.      Marland Kitchen pyridOXINE (VITAMIN B-6) 100 MG tablet Take 100 mg by mouth daily.       . Red Yeast Rice 600 MG CAPS Take 1 capsule by mouth every evening.       . triamcinolone cream (KENALOG) 0.1 %        No current facility-administered medications for this visit.    PHYSICAL EXAMINATION: ECOG PERFORMANCE STATUS: 0 - Asymptomatic  Filed Vitals:   04/15/14 0809  BP: 145/80  Pulse: 66  Temp: 97.8 F (36.6 C)  Resp: 18   Filed Weights   04/15/14 0809  Weight: 186 lb 11.2 oz (84.687 kg)    GENERAL:alert, no distress and comfortable SKIN: skin color, texture, turgor are normal, no rashes or significant lesions. Well healed surgical scar EYES: normal, Conjunctiva are pink and non-injected, sclera clear OROPHARYNX:no exudate, no erythema and lips, buccal mucosa, and tongue normal  NECK: supple,  thyroid normal size, non-tender, without nodularity LYMPH:  no palpable lymphadenopathy in the cervical, axillary or inguinal LUNGS: clear to auscultation and percussion with normal breathing effort HEART: regular rate & rhythm and no murmurs and no lower extremity edema ABDOMEN:abdomen soft, non-tender and normal bowel sounds Musculoskeletal:no cyanosis of digits and no clubbing  NEURO: alert & oriented x 3 with fluent speech, no focal motor/sensory deficits  LABORATORY DATA:  I have reviewed the data as listed    Component Value Date/Time   NA 140 04/15/2014 0758   NA 140 10/14/2013 1455   K 4.1 04/15/2014 0758   K 3.5* 10/14/2013 1455   CL 101 10/14/2013 1455   CO2 26 04/15/2014 0758   CO2 27 10/14/2013 1455   GLUCOSE 97 04/15/2014 0758   GLUCOSE 99 10/14/2013 1455   BUN 24.6 04/15/2014 0758   BUN 26* 10/14/2013 1455   CREATININE 1.2* 04/15/2014 0758   CREATININE 0.91 10/14/2013 1455   CALCIUM 9.4 04/15/2014 0758   CALCIUM 9.0 10/14/2013 1455   PROT 6.7 04/15/2014  0758   PROT 5.9* 10/14/2013 1455   ALBUMIN 4.0 04/15/2014 0758   ALBUMIN 3.7 10/14/2013 1455   AST 25 04/15/2014 0758   AST 14 10/14/2013 1455   ALT 22 04/15/2014 0758   ALT 16 10/14/2013 1455   ALKPHOS 96 04/15/2014 0758   ALKPHOS 94 10/14/2013 1455   BILITOT 0.46 04/15/2014 0758   BILITOT 0.5 10/14/2013 1455   GFRNONAA 63* 10/14/2013 1455   GFRAA 73* 10/14/2013 1455    No results found for this basename: SPEP, UPEP,  kappa and lambda light chains    Lab Results  Component Value Date   WBC 3.1* 04/15/2014   NEUTROABS 1.9 04/15/2014   HGB 12.6 04/15/2014   HCT 38.3 04/15/2014   MCV 82.0 04/15/2014   PLT 202 04/15/2014      Chemistry      Component Value Date/Time   NA 140 04/15/2014 0758   NA 140 10/14/2013 1455   K 4.1 04/15/2014 0758   K 3.5* 10/14/2013 1455   CL 101 10/14/2013 1455   CO2 26 04/15/2014 0758   CO2 27 10/14/2013 1455   BUN 24.6 04/15/2014 0758   BUN 26* 10/14/2013 1455   CREATININE 1.2* 04/15/2014 0758   CREATININE 0.91 10/14/2013 1455      Component Value  Date/Time   CALCIUM 9.4 04/15/2014 0758   CALCIUM 9.0 10/14/2013 1455   ALKPHOS 96 04/15/2014 0758   ALKPHOS 94 10/14/2013 1455   AST 25 04/15/2014 0758   AST 14 10/14/2013 1455   ALT 22 04/15/2014 0758   ALT 16 10/14/2013 1455   BILITOT 0.46 04/15/2014 0758   BILITOT 0.5 10/14/2013 1455      ASSESSMENT & PLAN:  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 I recommend we proceed with maintenance therapy. I plan to order imaging study only if she has signs and symptoms to suggest disease recurrence.    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  Leukopenia This is related to side effects of treatment. She is not symptomatic. Observed only.     No orders of the defined types were placed in this encounter.   All questions were answered. The patient knows to call the clinic with any problems, questions or concerns. No barriers to learning was detected. I spent 25 minutes counseling the patient face to face. The total time spent in the appointment was 30 minutes and more than 50% was on counseling and review of test results     [facility], [place], MD 04/15/2014 11:41 PM

## 2014-04-15 NOTE — Assessment & Plan Note (Signed)
This is related to side effects of treatment. She is not symptomatic. Observed only. 

## 2014-04-15 NOTE — Telephone Encounter (Signed)
gv and printed appt schedand avs for pt for Sept °

## 2014-04-23 DIAGNOSIS — M1710 Unilateral primary osteoarthritis, unspecified knee: Secondary | ICD-10-CM | POA: Diagnosis not present

## 2014-04-28 DIAGNOSIS — R109 Unspecified abdominal pain: Secondary | ICD-10-CM | POA: Diagnosis not present

## 2014-04-29 ENCOUNTER — Ambulatory Visit (HOSPITAL_COMMUNITY)
Admission: RE | Admit: 2014-04-29 | Discharge: 2014-04-29 | Disposition: A | Discharge status: Home / Self Care | Payer: Medicare Other | Source: Ambulatory Visit | Attending: Family Medicine | Admitting: Family Medicine

## 2014-04-29 ENCOUNTER — Other Ambulatory Visit (HOSPITAL_COMMUNITY): Payer: Self-pay | Admitting: Family Medicine

## 2014-04-29 ENCOUNTER — Encounter (HOSPITAL_COMMUNITY): Payer: Self-pay

## 2014-04-29 DIAGNOSIS — C8589 Other specified types of non-Hodgkin lymphoma, extranodal and solid organ sites: Secondary | ICD-10-CM | POA: Diagnosis not present

## 2014-04-29 DIAGNOSIS — M47817 Spondylosis without myelopathy or radiculopathy, lumbosacral region: Secondary | ICD-10-CM | POA: Insufficient documentation

## 2014-04-29 DIAGNOSIS — R110 Nausea: Secondary | ICD-10-CM

## 2014-04-29 DIAGNOSIS — M51379 Other intervertebral disc degeneration, lumbosacral region without mention of lumbar back pain or lower extremity pain: Secondary | ICD-10-CM | POA: Insufficient documentation

## 2014-04-29 DIAGNOSIS — K5900 Constipation, unspecified: Secondary | ICD-10-CM | POA: Diagnosis not present

## 2014-04-29 DIAGNOSIS — M5137 Other intervertebral disc degeneration, lumbosacral region: Secondary | ICD-10-CM | POA: Insufficient documentation

## 2014-04-29 DIAGNOSIS — R109 Unspecified abdominal pain: Secondary | ICD-10-CM

## 2014-04-29 DIAGNOSIS — K5730 Diverticulosis of large intestine without perforation or abscess without bleeding: Secondary | ICD-10-CM | POA: Insufficient documentation

## 2014-04-29 MED ORDER — IOHEXOL 300 MG/ML  SOLN
50.0000 mL | Freq: Once | INTRAMUSCULAR | Status: AC | PRN
  Administered 2014-04-29: 50 mL via ORAL

## 2014-04-29 MED ORDER — IOHEXOL 300 MG/ML  SOLN
100.0000 mL | Freq: Once | INTRAMUSCULAR | Status: AC | PRN
  Administered 2014-04-29: 100 mL via INTRAVENOUS

## 2014-05-19 DIAGNOSIS — L940 Localized scleroderma [morphea]: Secondary | ICD-10-CM | POA: Diagnosis not present

## 2014-05-19 DIAGNOSIS — L439 Lichen planus, unspecified: Secondary | ICD-10-CM | POA: Diagnosis not present

## 2014-05-19 DIAGNOSIS — Z85828 Personal history of other malignant neoplasm of skin: Secondary | ICD-10-CM | POA: Diagnosis not present

## 2014-05-30 DIAGNOSIS — J069 Acute upper respiratory infection, unspecified: Secondary | ICD-10-CM | POA: Diagnosis not present

## 2014-06-10 ENCOUNTER — Ambulatory Visit (HOSPITAL_BASED_OUTPATIENT_CLINIC_OR_DEPARTMENT_OTHER): Payer: Medicare Other | Admitting: Hematology and Oncology

## 2014-06-10 ENCOUNTER — Ambulatory Visit (HOSPITAL_BASED_OUTPATIENT_CLINIC_OR_DEPARTMENT_OTHER): Payer: Medicare Other

## 2014-06-10 ENCOUNTER — Encounter: Payer: Self-pay | Admitting: Hematology and Oncology

## 2014-06-10 ENCOUNTER — Telehealth: Payer: Self-pay | Admitting: *Deleted

## 2014-06-10 ENCOUNTER — Other Ambulatory Visit (HOSPITAL_BASED_OUTPATIENT_CLINIC_OR_DEPARTMENT_OTHER): Payer: Medicare Other

## 2014-06-10 ENCOUNTER — Telehealth: Payer: Self-pay | Admitting: Hematology and Oncology

## 2014-06-10 VITALS — BP 126/68 | HR 68 | Temp 98.0°F | Resp 18

## 2014-06-10 VITALS — BP 134/76 | HR 73 | Temp 98.2°F | Resp 18 | Ht 62.0 in | Wt 191.2 lb

## 2014-06-10 DIAGNOSIS — D72819 Decreased white blood cell count, unspecified: Secondary | ICD-10-CM

## 2014-06-10 DIAGNOSIS — C8299 Follicular lymphoma, unspecified, extranodal and solid organ sites: Secondary | ICD-10-CM

## 2014-06-10 DIAGNOSIS — C44729 Squamous cell carcinoma of skin of left lower limb, including hip: Secondary | ICD-10-CM

## 2014-06-10 DIAGNOSIS — C44721 Squamous cell carcinoma of skin of unspecified lower limb, including hip: Secondary | ICD-10-CM

## 2014-06-10 DIAGNOSIS — Z5112 Encounter for antineoplastic immunotherapy: Secondary | ICD-10-CM | POA: Diagnosis not present

## 2014-06-10 DIAGNOSIS — Z23 Encounter for immunization: Secondary | ICD-10-CM

## 2014-06-10 DIAGNOSIS — C8239 Follicular lymphoma grade IIIa, extranodal and solid organ sites: Secondary | ICD-10-CM

## 2014-06-10 LAB — CBC WITH DIFFERENTIAL/PLATELET
BASO%: 1.4 % (ref 0.0–2.0)
BASOS ABS: 0 10*3/uL (ref 0.0–0.1)
EOS%: 4.5 % (ref 0.0–7.0)
Eosinophils Absolute: 0.2 10*3/uL (ref 0.0–0.5)
HEMATOCRIT: 36.6 % (ref 34.8–46.6)
HEMOGLOBIN: 12.3 g/dL (ref 11.6–15.9)
LYMPH%: 19.3 % (ref 14.0–49.7)
MCH: 27.9 pg (ref 25.1–34.0)
MCHC: 33.6 g/dL (ref 31.5–36.0)
MCV: 82.8 fL (ref 79.5–101.0)
MONO#: 0.4 10*3/uL (ref 0.1–0.9)
MONO%: 11.3 % (ref 0.0–14.0)
NEUT#: 2.2 10*3/uL (ref 1.5–6.5)
NEUT%: 63.5 % (ref 38.4–76.8)
Platelets: 214 10*3/uL (ref 145–400)
RBC: 4.42 10*6/uL (ref 3.70–5.45)
RDW: 15 % — ABNORMAL HIGH (ref 11.2–14.5)
WBC: 3.4 10*3/uL — ABNORMAL LOW (ref 3.9–10.3)
lymph#: 0.7 10*3/uL — ABNORMAL LOW (ref 0.9–3.3)

## 2014-06-10 LAB — COMPREHENSIVE METABOLIC PANEL (CC13)
ALK PHOS: 95 U/L (ref 40–150)
ALT: 23 U/L (ref 0–55)
AST: 26 U/L (ref 5–34)
Albumin: 3.9 g/dL (ref 3.5–5.0)
Anion Gap: 8 mEq/L (ref 3–11)
BUN: 15 mg/dL (ref 7.0–26.0)
CALCIUM: 9.5 mg/dL (ref 8.4–10.4)
CO2: 27 mEq/L (ref 22–29)
CREATININE: 1 mg/dL (ref 0.6–1.1)
Chloride: 106 mEq/L (ref 98–109)
Glucose: 89 mg/dl (ref 70–140)
POTASSIUM: 3.9 meq/L (ref 3.5–5.1)
Sodium: 141 mEq/L (ref 136–145)
Total Bilirubin: 0.51 mg/dL (ref 0.20–1.20)
Total Protein: 6.7 g/dL (ref 6.4–8.3)

## 2014-06-10 LAB — LACTATE DEHYDROGENASE (CC13): LDH: 195 U/L (ref 125–245)

## 2014-06-10 MED ORDER — DIPHENHYDRAMINE HCL 25 MG PO CAPS
ORAL_CAPSULE | ORAL | Status: AC
  Filled 2014-06-10: qty 2

## 2014-06-10 MED ORDER — ACETAMINOPHEN 325 MG PO TABS
ORAL_TABLET | ORAL | Status: AC
  Filled 2014-06-10: qty 2

## 2014-06-10 MED ORDER — DIPHENHYDRAMINE HCL 25 MG PO CAPS
50.0000 mg | ORAL_CAPSULE | Freq: Once | ORAL | Status: AC
  Administered 2014-06-10: 50 mg via ORAL

## 2014-06-10 MED ORDER — INFLUENZA VAC SPLIT QUAD 0.5 ML IM SUSY
0.5000 mL | PREFILLED_SYRINGE | Freq: Once | INTRAMUSCULAR | Status: AC
  Administered 2014-06-10: 0.5 mL via INTRAMUSCULAR
  Filled 2014-06-10: qty 0.5

## 2014-06-10 MED ORDER — SODIUM CHLORIDE 0.9 % IV SOLN
375.0000 mg/m2 | Freq: Once | INTRAVENOUS | Status: AC
  Administered 2014-06-10: 700 mg via INTRAVENOUS
  Filled 2014-06-10: qty 70

## 2014-06-10 MED ORDER — ACETAMINOPHEN 325 MG PO TABS
650.0000 mg | ORAL_TABLET | Freq: Once | ORAL | Status: AC
  Administered 2014-06-10: 650 mg via ORAL

## 2014-06-10 MED ORDER — SODIUM CHLORIDE 0.9 % IJ SOLN
10.0000 mL | INTRAMUSCULAR | Status: DC | PRN
  Administered 2014-06-10: 10 mL
  Filled 2014-06-10: qty 10

## 2014-06-10 MED ORDER — HEPARIN SOD (PORK) LOCK FLUSH 100 UNIT/ML IV SOLN
500.0000 [IU] | Freq: Once | INTRAVENOUS | Status: AC | PRN
  Administered 2014-06-10: 500 [IU]
  Filled 2014-06-10: qty 5

## 2014-06-10 MED ORDER — SODIUM CHLORIDE 0.9 % IV SOLN
Freq: Once | INTRAVENOUS | Status: AC
  Administered 2014-06-10: 10:00:00 via INTRAVENOUS

## 2014-06-10 NOTE — Progress Notes (Signed)
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 Needle biopsy was inconclusive.     08/02/2011 Imaging CT scan of the chest abdomen and pelvis showed numerous lymphadenopathy above and below the diaphragm.    08/16/2011 Imaging MRI of the abdomen revealed enlarging left kidney lesion worrisome for malignancy   09/20/2011 Initial Diagnosis Lymphoma, low grade from kidney biopsy, follicular   1/44/8185 Procedure Histologic type: Non-Hodgkin's lymphoma, follicular center cell type. Grade (if applicable): Favor high grade, Ki-67 ranges from 10-50%. Immunohistochemical stains: CD20, CD79a, CD10, BCL-2, BCL-6, CD21, CD3, CD43.   05/09/2013 Procedure BM biopsy is highly suspicious of involvement. Patient had persistent leukopenia   05/09/2013 Imaging PET/CT showed significant involvement of LN everywhere   07/16/2013 - 11/05/2013 Chemotherapy She completed 6 cycles of R. CHOP chemotherapy.   07/23/2013 - 07/27/2013 Hospital Admission Admitted for management of mucositis and neutropenic fever. Blood culture was positive for gram negative rods, resolved with antibiotics   10/08/2013 Adverse Reaction The dose of  vincristine is reduced by 50%   12/15/2013 Imaging Repeat PET CT scan show complete response to treatment.   12/17/2013 -  Chemotherapy She begin maintenance rituximab every other month   02/23/2014 Procedure She had shave biopsy of the left upper, that came back well-differentiated squamous cell carcinoma.   04/29/2014 Imaging CT scan of the abdomen showed no evidence of disease recurrence.    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    INTERVAL HISTORY: Please see below for problem oriented charting. She is seen prior to rituximab treatment today. She has no new symptoms.  REVIEW OF SYSTEMS:   Constitutional: Denies fevers, chills or abnormal weight loss Eyes: Denies blurriness of vision Ears, nose, mouth, throat, and face: Denies mucositis or sore throat Respiratory: Denies cough, dyspnea or wheezes Cardiovascular: Denies palpitation, chest discomfort or lower extremity swelling Gastrointestinal:  Denies nausea, heartburn or change in bowel habits Skin: Denies abnormal skin rashes Lymphatics: Denies new lymphadenopathy or easy bruising Neurological:Denies numbness, tingling or new weaknesses Behavioral/Psych: Mood is stable, no new changes  All other systems were reviewed with the patient and are negative.  I have reviewed the past medical history, past surgical history, social history and family history with the patient and they are unchanged from previous note.  ALLERGIES:  is allergic to latex.  MEDICATIONS:  Current Outpatient Prescriptions  Medication Sig Dispense Refill  . acetaminophen (TYLENOL) 325 MG tablet Take  325 mg by mouth every 6 (six) hours as needed.      . Alum & Mag  Hydroxide-Simeth (MAGIC MOUTHWASH W/LIDOCAINE) SOLN Take 5 mLs by mouth 4 (four) times daily.  240 mL  2  . aspirin 81 MG tablet Take 81 mg by mouth at bedtime.       Marland Kitchen CALCIUM-MAGNESIUM-ZINC PO Take by mouth daily.      . Cholecalciferol (VITAMIN D) 2000 UNITS tablet Take 2,000 Units by mouth daily.      . clobetasol cream (TEMOVATE) 3.33 % Apply 1 application topically daily. Apply to affected area      . docusate sodium (COLACE) 100 MG capsule Take 1 capsule (100 mg total) by mouth 2 (two) times daily.  100 capsule  1  . lansoprazole (PREVACID) 30 MG capsule Take 30 mg by mouth daily.       Marland Kitchen lidocaine-prilocaine (EMLA) cream Apply topically as needed.  30 g  0  . losartan-hydrochlorothiazide (HYZAAR) 100-12.5 MG per tablet       . nystatin cream (MYCOSTATIN)       . ondansetron (ZOFRAN) 8 MG tablet Take 8 mg by mouth every 8 (eight) hours as needed for nausea or vomiting.      . potassium chloride SA (K-DUR,KLOR-CON) 20 MEQ tablet Take 20 mEq by mouth daily.       . Probiotic Product (PROBIOTIC DAILY PO) Take 1 capsule by mouth daily.      Marland Kitchen pyridOXINE (VITAMIN B-6) 100 MG tablet Take 100 mg by mouth daily.       . Red Yeast Rice 600 MG CAPS Take 1 capsule by mouth every evening.       . triamcinolone cream (KENALOG) 0.1 %        Current Facility-Administered Medications  Medication Dose Route Frequency Provider Last Rate Last Dose  . Influenza vac split quadrivalent PF (FLUARIX) injection 0.5 mL  0.5 mL Intramuscular Once Heath Lark, MD       Facility-Administered Medications Ordered in Other Visits  Medication Dose Route Frequency Provider Last Rate Last Dose  . heparin lock flush 100 unit/mL  500 Units Intracatheter Once PRN Heath Lark, MD      . riTUXimab (RITUXAN) 700 mg in sodium chloride 0.9 % 180 mL chemo infusion  375 mg/m2 (Treatment Plan Actual) Intravenous Once Heath Lark, MD      . sodium chloride 0.9 % injection 10 mL  10 mL Intracatheter PRN Heath Lark, MD         PHYSICAL EXAMINATION: ECOG PERFORMANCE STATUS: 0 - Asymptomatic  Filed Vitals:   06/10/14 0922  BP: 134/76  Pulse: 73  Temp: 98.2 F (36.8 C)  Resp: 18   Filed Weights   06/10/14 0922  Weight: 191 lb 3.2 oz (86.728 kg)    GENERAL:alert, no distress and comfortable SKIN: skin color, texture, turgor are normal, no rashes or significant lesions EYES: normal, Conjunctiva are pink and non-injected, sclera clear OROPHARYNX:no exudate, no erythema and lips, buccal mucosa, and tongue normal  NECK: supple, thyroid normal size, non-tender, without nodularity LYMPH:  no palpable lymphadenopathy in the cervical, axillary or inguinal LUNGS: clear to auscultation and percussion with normal breathing effort HEART: regular rate & rhythm and no murmurs and no lower extremity edema ABDOMEN:abdomen soft, non-tender and normal bowel sounds Musculoskeletal:no cyanosis of digits and no clubbing  NEURO: alert & oriented x 3 with fluent speech, no focal motor/sensory deficits  LABORATORY DATA:  I have reviewed the data as listed  Component Value Date/Time   NA 141 06/10/2014 0912   NA 140 10/14/2013 1455   K 3.9 06/10/2014 0912   K 3.5* 10/14/2013 1455   CL 101 10/14/2013 1455   CO2 27 06/10/2014 0912   CO2 27 10/14/2013 1455   GLUCOSE 89 06/10/2014 0912   GLUCOSE 99 10/14/2013 1455   BUN 15.0 06/10/2014 0912   BUN 26* 10/14/2013 1455   CREATININE 1.0 06/10/2014 0912   CREATININE 0.91 10/14/2013 1455   CALCIUM 9.5 06/10/2014 0912   CALCIUM 9.0 10/14/2013 1455   PROT 6.7 06/10/2014 0912   PROT 5.9* 10/14/2013 1455   ALBUMIN 3.9 06/10/2014 0912   ALBUMIN 3.7 10/14/2013 1455   AST 26 06/10/2014 0912   AST 14 10/14/2013 1455   ALT 23 06/10/2014 0912   ALT 16 10/14/2013 1455   ALKPHOS 95 06/10/2014 0912   ALKPHOS 94 10/14/2013 1455   BILITOT 0.51 06/10/2014 0912   BILITOT 0.5 10/14/2013 1455   GFRNONAA 63* 10/14/2013 1455   GFRAA 73* 10/14/2013 1455    No results found for this basename: SPEP, UPEP,  kappa and lambda  light chains    Lab Results  Component Value Date   WBC 3.4* 06/10/2014   NEUTROABS 2.2 06/10/2014   HGB 12.3 06/10/2014   HCT 36.6 06/10/2014   MCV 82.8 06/10/2014   PLT 214 06/10/2014      Chemistry      Component Value Date/Time   NA 141 06/10/2014 0912   NA 140 10/14/2013 1455   K 3.9 06/10/2014 0912   K 3.5* 10/14/2013 1455   CL 101 10/14/2013 1455   CO2 27 06/10/2014 0912   CO2 27 10/14/2013 1455   BUN 15.0 06/10/2014 0912   BUN 26* 10/14/2013 1455   CREATININE 1.0 06/10/2014 0912   CREATININE 0.91 10/14/2013 1455      Component Value Date/Time   CALCIUM 9.5 06/10/2014 0912   CALCIUM 9.0 10/14/2013 1455   ALKPHOS 95 06/10/2014 0912   ALKPHOS 94 10/14/2013 1455   AST 26 06/10/2014 0912   AST 14 10/14/2013 1455   ALT 23 06/10/2014 0912   ALT 16 10/14/2013 1455   BILITOT 0.51 06/10/2014 0912   BILITOT 0.5 10/14/2013 1455       RADIOGRAPHIC STUDIES: I reviewed her CT scan from August 2015 I have personally reviewed the radiological images as listed and agreed with the findings in the report.   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 We will continue maintenance program with treatment every other month for total of 2 years.  Leukopenia This is related to side effects of treatment. She is not symptomatic. Observed only.  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   No orders of the defined types were placed in this encounter.   All questions were answered. The patient knows to call the clinic with any problems, questions or concerns. No barriers to learning was detected. I spent 25 minutes counseling the patient face to face. The total time spent in the appointment was 25 minutes and more than 50% was on counseling and review of test results     [facility], [place], MD 06/10/2014 10:10 AM

## 2014-06-10 NOTE — Assessment & Plan Note (Signed)
She will continue close followup with his dermatologist.

## 2014-06-10 NOTE — Telephone Encounter (Signed)
, °

## 2014-06-10 NOTE — Telephone Encounter (Signed)
Per staff message and POF I have scheduled appts. Advised scheduler of appts. JMW  

## 2014-06-10 NOTE — Assessment & Plan Note (Signed)
This is related to side effects of treatment. She is not symptomatic. Observed only. 

## 2014-06-10 NOTE — Assessment & Plan Note (Signed)
Clinically, she has no evidence of disease recurrence. We will continue maintenance program with treatment every other month for total of 2 years.

## 2014-06-10 NOTE — Patient Instructions (Signed)

## 2014-06-12 DIAGNOSIS — L748 Other eccrine sweat disorders: Secondary | ICD-10-CM | POA: Diagnosis not present

## 2014-06-12 DIAGNOSIS — L609 Nail disorder, unspecified: Secondary | ICD-10-CM | POA: Diagnosis not present

## 2014-06-19 DIAGNOSIS — M1711 Unilateral primary osteoarthritis, right knee: Secondary | ICD-10-CM | POA: Diagnosis not present

## 2014-06-22 DIAGNOSIS — C8199 Hodgkin lymphoma, unspecified, extranodal and solid organ sites: Secondary | ICD-10-CM | POA: Diagnosis not present

## 2014-06-22 DIAGNOSIS — I10 Essential (primary) hypertension: Secondary | ICD-10-CM | POA: Diagnosis not present

## 2014-06-25 ENCOUNTER — Encounter: Payer: Self-pay | Admitting: *Deleted

## 2014-07-09 DIAGNOSIS — Z85828 Personal history of other malignant neoplasm of skin: Secondary | ICD-10-CM | POA: Diagnosis not present

## 2014-07-09 DIAGNOSIS — Z08 Encounter for follow-up examination after completed treatment for malignant neoplasm: Secondary | ICD-10-CM | POA: Diagnosis not present

## 2014-07-09 DIAGNOSIS — L438 Other lichen planus: Secondary | ICD-10-CM | POA: Diagnosis not present

## 2014-07-20 DIAGNOSIS — C8199 Hodgkin lymphoma, unspecified, extranodal and solid organ sites: Secondary | ICD-10-CM | POA: Diagnosis not present

## 2014-07-20 DIAGNOSIS — I10 Essential (primary) hypertension: Secondary | ICD-10-CM | POA: Diagnosis not present

## 2014-07-22 ENCOUNTER — Other Ambulatory Visit: Payer: Self-pay | Admitting: Hematology and Oncology

## 2014-07-27 ENCOUNTER — Encounter: Payer: Self-pay | Admitting: Cardiology

## 2014-07-27 ENCOUNTER — Ambulatory Visit (INDEPENDENT_AMBULATORY_CARE_PROVIDER_SITE_OTHER): Payer: Medicare Other | Admitting: Cardiology

## 2014-07-27 VITALS — BP 160/82 | HR 73 | Ht 62.0 in | Wt 181.0 lb

## 2014-07-27 DIAGNOSIS — I10 Essential (primary) hypertension: Secondary | ICD-10-CM

## 2014-07-27 DIAGNOSIS — Q245 Malformation of coronary vessels: Secondary | ICD-10-CM

## 2014-07-27 NOTE — Progress Notes (Signed)
HPI The patient presents for followup of a known coronary anomaly. Since I last saw her she has been followed by oncology for management of lymphoma.  She was treated with 6 cycles of R CHOP.  She did well with this and says she is doing fine. She did have a couple of episodes of syncope. One was with a blood pressure drop and one was while she was nauseated and sitting for this. This happened earlier in the year. So as not had any episodes. I did review hospital records from February when this happened. She's not had any orthostasis. However, she is not describing any chest pressure, neck or arm discomfort. She's not having any palpitations, presyncope or syncope otherwise.  She has had no PND or orthopnea. She hasn't been exercising but she does vacuum without limitations.  She is limited by knee pain.   Allergies  Allergen Reactions  . Latex Swelling and Rash    Current Outpatient Prescriptions  Medication Sig Dispense Refill  . acetaminophen (TYLENOL) 325 MG tablet Take 325 mg by mouth every 6 (six) hours as needed.    . Alum & Mag Hydroxide-Simeth (MAGIC MOUTHWASH W/LIDOCAINE) SOLN Take 5 mLs by mouth 4 (four) times daily. 240 mL 2  . aspirin 81 MG tablet Take 81 mg by mouth at bedtime.     Marland Kitchen CALCIUM-MAGNESIUM-ZINC PO Take by mouth daily.    . Cholecalciferol (VITAMIN D) 2000 UNITS tablet Take 2,000 Units by mouth daily.    . clobetasol cream (TEMOVATE) 4.00 % Apply 1 application topically daily. Apply to affected area    . docusate sodium (COLACE) 100 MG capsule Take 1 capsule (100 mg total) by mouth 2 (two) times daily. 100 capsule 1  . lansoprazole (PREVACID) 30 MG capsule Take 30 mg by mouth daily.     Marland Kitchen lidocaine-prilocaine (EMLA) cream Apply topically as needed. 30 g 0  . losartan-hydrochlorothiazide (HYZAAR) 100-12.5 MG per tablet     . nystatin cream (MYCOSTATIN)     . ondansetron (ZOFRAN) 8 MG tablet Take 8 mg by mouth every 8 (eight) hours as needed for nausea or vomiting.      . potassium chloride SA (K-DUR,KLOR-CON) 20 MEQ tablet Take 20 mEq by mouth daily.     . Probiotic Product (PROBIOTIC DAILY PO) Take 1 capsule by mouth daily.    Marland Kitchen pyridOXINE (VITAMIN B-6) 100 MG tablet Take 100 mg by mouth daily.     . Red Yeast Rice 600 MG CAPS Take 1 capsule by mouth every evening.     . triamcinolone cream (KENALOG) 0.1 %      No current facility-administered medications for this visit.    Past Medical History  Diagnosis Date  . CAD (coronary artery disease)   . GERD (gastroesophageal reflux disease)   . HTN (hypertension)   . Hyperlipidemia   . Enlargement of lymph nodes 07/26/2011  . 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 Lymphoma   . Tick bite of knee   . Thyroid disease   . Follicular lymphoma 86/76/1951  . 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  B-cell, favor follicular NHL.  CD79a, CD20 and CD10 positive.  Cyclin D1 negative.   . Mouth sores 07/18/2013  . Fever 07/23/2013  . Fungal infection 08/01/2013  . Hemorrhoids 08/05/2013  . Leukopenia 09/16/2013  . 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  Skull deformity 10/08/2013  . Broken tooth 10/17/2013  . Infection of lip 10/27/2013  . UTI (urinary tract infection) 11/10/2013    Past Surgical History  Procedure Laterality Date  . Mr breast bilateral  cyst removal  . Abdominal hysterectomy    . Lymph node dissection  x 3 different times    behind left ear 2012/ left clavical area  . Tubal ligation    . Knee arthroscopy w/ debridement  05/2011    right  . Bone marrow biopsy  05/09/2013  . Bone marrow aspiration  05/09/2013    ROS: Reflux. Otherwise as stated in the HPI and negative for all other systems.  PHYSICAL EXAM There were no vitals taken for this visit. GENERAL:  Well appearing NECK:  No jugular venous distention, waveform within normal limits, carotid upstroke brisk and  symmetric, no bruits, no thyromegaly LYMPHATICS:  No cervical, inguinal adenopathy LUNGS:  Clear to auscultation bilaterally BACK:  No CVA tenderness CHEST:  Unremarkable HEART:  PMI not displaced or sustained,S1 and S2 within normal limits, no S3, no S4, no clicks, no rubs, no murmurs ABD:  Flat, positive bowel sounds normal in frequency in pitch, no bruits, no rebound, no guarding, no midline pulsatile mass, no hepatomegaly, no splenomegaly EXT:  2 plus pulses throughout, no edema, no cyanosis no clubbing  EKG:    Sinus rhythm, rate 73, axis within normal limits, intervals within normal limits, no acute ST-T wave changes.  ASSESSMENT AND PLAN  CORONARY ARTERY ANOMALY, CONGENITAL She has no symptoms related to this. He did have a stress perfusion study in 2011. At this point there is no indication for repeat study.    ESSENTIAL HYPERTENSION, BENIGN Her blood pressure is controlled typically although slightly elevated today. She will continue the meds as listed.   HYPERLIPIDEMIA She cannot tolerate prescription meds and will continue with red yeast rice.   SYNCOPE:  I evaluated the events surrounding this and this appeared to be vagal. She's not having any further episodes. No further workup is planned.  HIGH RISK MED:  I will follow her ejection fraction after her 6 courses of R CHOP given the potential for cardiomyopathy.

## 2014-07-27 NOTE — Patient Instructions (Signed)
We are ordering an Echo  Your physician recommends that you schedule a follow-up appointment in: one year with Dr. Percival Spanish

## 2014-07-30 ENCOUNTER — Ambulatory Visit (HOSPITAL_COMMUNITY)
Admission: RE | Admit: 2014-07-30 | Discharge: 2014-07-30 | Disposition: A | Discharge status: Home / Self Care | Payer: Medicare Other | Source: Ambulatory Visit | Attending: Cardiology | Admitting: Cardiology

## 2014-07-30 DIAGNOSIS — I2510 Atherosclerotic heart disease of native coronary artery without angina pectoris: Secondary | ICD-10-CM | POA: Insufficient documentation

## 2014-07-30 DIAGNOSIS — R55 Syncope and collapse: Secondary | ICD-10-CM | POA: Insufficient documentation

## 2014-07-30 DIAGNOSIS — C8590 Non-Hodgkin lymphoma, unspecified, unspecified site: Secondary | ICD-10-CM | POA: Diagnosis not present

## 2014-07-30 DIAGNOSIS — I10 Essential (primary) hypertension: Secondary | ICD-10-CM | POA: Insufficient documentation

## 2014-07-30 DIAGNOSIS — E785 Hyperlipidemia, unspecified: Secondary | ICD-10-CM | POA: Diagnosis not present

## 2014-07-30 DIAGNOSIS — Q245 Malformation of coronary vessels: Secondary | ICD-10-CM | POA: Diagnosis not present

## 2014-07-30 NOTE — Progress Notes (Signed)
2D Echocardiogram Complete.  07/30/2014   Pinchas Reither, RDCS  

## 2014-08-12 ENCOUNTER — Ambulatory Visit (HOSPITAL_BASED_OUTPATIENT_CLINIC_OR_DEPARTMENT_OTHER): Payer: Medicare Other

## 2014-08-12 ENCOUNTER — Ambulatory Visit (HOSPITAL_BASED_OUTPATIENT_CLINIC_OR_DEPARTMENT_OTHER): Payer: Medicare Other | Admitting: Hematology and Oncology

## 2014-08-12 ENCOUNTER — Encounter: Payer: Self-pay | Admitting: Hematology and Oncology

## 2014-08-12 ENCOUNTER — Telehealth: Payer: Self-pay | Admitting: Hematology and Oncology

## 2014-08-12 ENCOUNTER — Other Ambulatory Visit (HOSPITAL_BASED_OUTPATIENT_CLINIC_OR_DEPARTMENT_OTHER): Payer: Medicare Other

## 2014-08-12 VITALS — BP 144/81 | HR 71 | Temp 98.1°F | Resp 18 | Ht 62.0 in | Wt 190.3 lb

## 2014-08-12 DIAGNOSIS — C44729 Squamous cell carcinoma of skin of left lower limb, including hip: Secondary | ICD-10-CM

## 2014-08-12 DIAGNOSIS — C8239 Follicular lymphoma grade IIIa, extranodal and solid organ sites: Secondary | ICD-10-CM

## 2014-08-12 DIAGNOSIS — D72819 Decreased white blood cell count, unspecified: Secondary | ICD-10-CM | POA: Diagnosis not present

## 2014-08-12 DIAGNOSIS — Z5112 Encounter for antineoplastic immunotherapy: Secondary | ICD-10-CM | POA: Diagnosis not present

## 2014-08-12 LAB — CBC WITH DIFFERENTIAL/PLATELET
BASO%: 2.8 % — ABNORMAL HIGH (ref 0.0–2.0)
BASOS ABS: 0.1 10*3/uL (ref 0.0–0.1)
EOS ABS: 0.1 10*3/uL (ref 0.0–0.5)
EOS%: 4.5 % (ref 0.0–7.0)
HCT: 37.5 % (ref 34.8–46.6)
HEMOGLOBIN: 12.3 g/dL (ref 11.6–15.9)
LYMPH%: 22.5 % (ref 14.0–49.7)
MCH: 27.7 pg (ref 25.1–34.0)
MCHC: 32.9 g/dL (ref 31.5–36.0)
MCV: 84.2 fL (ref 79.5–101.0)
MONO#: 0.3 10*3/uL (ref 0.1–0.9)
MONO%: 11.5 % (ref 0.0–14.0)
NEUT%: 58.7 % (ref 38.4–76.8)
NEUTROS ABS: 1.6 10*3/uL (ref 1.5–6.5)
Platelets: 214 10*3/uL (ref 145–400)
RBC: 4.45 10*6/uL (ref 3.70–5.45)
RDW: 15.1 % — AB (ref 11.2–14.5)
WBC: 2.8 10*3/uL — ABNORMAL LOW (ref 3.9–10.3)
lymph#: 0.6 10*3/uL — ABNORMAL LOW (ref 0.9–3.3)

## 2014-08-12 LAB — COMPREHENSIVE METABOLIC PANEL (CC13)
ALBUMIN: 4 g/dL (ref 3.5–5.0)
ALT: 27 U/L (ref 0–55)
ANION GAP: 8 meq/L (ref 3–11)
AST: 29 U/L (ref 5–34)
Alkaline Phosphatase: 91 U/L (ref 40–150)
BUN: 17.5 mg/dL (ref 7.0–26.0)
CO2: 28 mEq/L (ref 22–29)
CREATININE: 1 mg/dL (ref 0.6–1.1)
Calcium: 9.5 mg/dL (ref 8.4–10.4)
Chloride: 104 mEq/L (ref 98–109)
Glucose: 83 mg/dl (ref 70–140)
Potassium: 4 mEq/L (ref 3.5–5.1)
Sodium: 140 mEq/L (ref 136–145)
Total Bilirubin: 0.47 mg/dL (ref 0.20–1.20)
Total Protein: 6.6 g/dL (ref 6.4–8.3)

## 2014-08-12 LAB — LACTATE DEHYDROGENASE (CC13): LDH: 210 U/L (ref 125–245)

## 2014-08-12 MED ORDER — DIPHENHYDRAMINE HCL 25 MG PO CAPS
ORAL_CAPSULE | ORAL | Status: AC
  Filled 2014-08-12: qty 2

## 2014-08-12 MED ORDER — ACETAMINOPHEN 325 MG PO TABS
ORAL_TABLET | ORAL | Status: AC
  Filled 2014-08-12: qty 2

## 2014-08-12 MED ORDER — ACETAMINOPHEN 325 MG PO TABS
650.0000 mg | ORAL_TABLET | Freq: Once | ORAL | Status: AC
  Administered 2014-08-12: 650 mg via ORAL

## 2014-08-12 MED ORDER — SODIUM CHLORIDE 0.9 % IJ SOLN
10.0000 mL | INTRAMUSCULAR | Status: DC | PRN
  Administered 2014-08-12: 10 mL
  Filled 2014-08-12: qty 10

## 2014-08-12 MED ORDER — SODIUM CHLORIDE 0.9 % IV SOLN
375.0000 mg/m2 | Freq: Once | INTRAVENOUS | Status: AC
  Administered 2014-08-12: 700 mg via INTRAVENOUS
  Filled 2014-08-12: qty 70

## 2014-08-12 MED ORDER — HEPARIN SOD (PORK) LOCK FLUSH 100 UNIT/ML IV SOLN
500.0000 [IU] | Freq: Once | INTRAVENOUS | Status: AC | PRN
  Administered 2014-08-12: 500 [IU]
  Filled 2014-08-12: qty 5

## 2014-08-12 MED ORDER — DIPHENHYDRAMINE HCL 25 MG PO CAPS
50.0000 mg | ORAL_CAPSULE | Freq: Once | ORAL | Status: AC
  Administered 2014-08-12: 50 mg via ORAL

## 2014-08-12 MED ORDER — SODIUM CHLORIDE 0.9 % IV SOLN
Freq: Once | INTRAVENOUS | Status: AC
  Administered 2014-08-12: 10:00:00 via INTRAVENOUS

## 2014-08-12 NOTE — Telephone Encounter (Signed)
Pt confirmed labs/ov per 12/02 POF, gave pt AVS..... KJ °

## 2014-08-12 NOTE — Assessment & Plan Note (Signed)
This is related to side effects of treatment. She is not symptomatic. Observed only. 

## 2014-08-12 NOTE — Patient Instructions (Signed)
[facility] Discharge Instructions for Patients Receiving Chemotherapy  Today you received the following chemotherapy agent: Rituxan   To help prevent nausea and vomiting after your treatment, we encourage you to take your nausea medication as prescribed.    If you develop nausea and vomiting that is not controlled by your nausea medication, call the clinic.   BELOW ARE SYMPTOMS THAT SHOULD BE REPORTED IMMEDIATELY:  *FEVER GREATER THAN 100.5 F  *CHILLS WITH OR WITHOUT FEVER  NAUSEA AND VOMITING THAT IS NOT CONTROLLED WITH YOUR NAUSEA MEDICATION  *UNUSUAL SHORTNESS OF BREATH  *UNUSUAL BRUISING OR BLEEDING  TENDERNESS IN MOUTH AND THROAT WITH OR WITHOUT PRESENCE OF ULCERS  *URINARY PROBLEMS  *BOWEL PROBLEMS  UNUSUAL RASH Items with * indicate a potential emergency and should be followed up as soon as possible.  Feel free to call the clinic you have any questions or concerns. The clinic phone number is [phone].

## 2014-08-12 NOTE — Progress Notes (Signed)
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 Needle biopsy was inconclusive.     08/02/2011 Imaging CT scan of the chest abdomen and pelvis showed numerous lymphadenopathy above and below the diaphragm.    08/16/2011 Imaging MRI of the abdomen revealed enlarging left kidney lesion worrisome for malignancy   09/20/2011 Initial Diagnosis Lymphoma, low grade from kidney biopsy, follicular   2/67/1245 Procedure Histologic type: Non-Hodgkin's lymphoma, follicular center cell type. Grade (if applicable): Favor high grade, Ki-67 ranges from 10-50%. Immunohistochemical stains: CD20, CD79a, CD10, BCL-2, BCL-6, CD21, CD3, CD43.   05/09/2013 Procedure BM biopsy is highly suspicious of involvement. Patient had persistent leukopenia   05/09/2013 Imaging PET/CT showed significant involvement of LN everywhere   07/16/2013 - 11/05/2013 Chemotherapy She completed 6 cycles of R. CHOP chemotherapy.   07/23/2013 - 07/27/2013 Hospital Admission Admitted for management of mucositis and neutropenic fever. Blood culture was positive for gram negative rods, resolved with antibiotics   10/08/2013 Adverse Reaction The dose of  vincristine is reduced by 50%   12/15/2013 Imaging Repeat PET CT scan show complete response to treatment.   12/17/2013 -  Chemotherapy She begin maintenance rituximab every other month   02/23/2014 Procedure She had shave biopsy of the left upper, that came back well-differentiated squamous cell carcinoma.   04/29/2014 Imaging CT scan of the abdomen showed no evidence of disease recurrence.    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    INTERVAL HISTORY: Please see below for problem oriented charting. She is seen prior to her treatment with rituximab. She has recent dermatology review without further skin lesions removed. She received recent injection to her knees to treat chronic degenerative joint disease. She was treated with antibiotics over the last 2 months for upper respiratory tract infection. She has seen cardiologist for evaluation of prior syncopal episode. Repeat heart test was normal.  REVIEW OF SYSTEMS:   Constitutional: Denies fevers, chills or abnormal weight loss Eyes: Denies blurriness of vision Ears, nose, mouth, throat, and face: Denies mucositis or sore throat Respiratory: Denies cough, dyspnea or wheezes Cardiovascular: Denies palpitation, chest discomfort or lower extremity swelling Gastrointestinal:  Denies nausea, heartburn or change in bowel habits Skin: Denies abnormal skin rashes Lymphatics: Denies new lymphadenopathy or easy bruising Neurological:Denies numbness, tingling or new weaknesses Behavioral/Psych: Mood is stable, no new changes  All other systems were reviewed with the patient and are negative.  I have  reviewed the past medical history, past surgical history,  social history and family history with the patient and they are unchanged from previous note.  ALLERGIES:  is allergic to latex.  MEDICATIONS:  Current Outpatient Prescriptions  Medication Sig Dispense Refill  . acetaminophen (TYLENOL) 325 MG tablet Take 325 mg by mouth every 6 (six) hours as needed.    . Alum & Mag Hydroxide-Simeth (MAGIC MOUTHWASH W/LIDOCAINE) SOLN Take 5 mLs by mouth 4 (four) times daily. (Patient taking differently: Take 5 mLs by mouth 4 (four) times daily as needed. ) 240 mL 2  . aspirin 81 MG tablet Take 81 mg by mouth at bedtime.     Marland Kitchen CALCIUM-MAGNESIUM-ZINC PO Take by mouth daily.    . Cholecalciferol (VITAMIN D) 2000 UNITS tablet Take 2,000 Units by mouth daily.    . clobetasol cream (TEMOVATE) 9.17 % Apply 1 application topically daily. Apply to affected area    . docusate sodium (COLACE) 100 MG capsule Take 1 capsule (100 mg total) by mouth 2 (two) times daily. 100 capsule 1  . lansoprazole (PREVACID) 30 MG capsule Take 30 mg by mouth daily.     Marland Kitchen lidocaine-prilocaine (EMLA) cream Apply topically as needed. 30 g 0  . losartan-hydrochlorothiazide (HYZAAR) 100-12.5 MG per tablet     . nystatin cream (MYCOSTATIN)     . ondansetron (ZOFRAN) 8 MG tablet Take 8 mg by mouth every 8 (eight) hours as needed for nausea or vomiting.    . potassium chloride SA (K-DUR,KLOR-CON) 20 MEQ tablet Take 20 mEq by mouth daily.     . Probiotic Product (PROBIOTIC DAILY PO) Take 1 capsule by mouth daily.    . Red Yeast Rice 600 MG CAPS Take 1 capsule by mouth every evening.     . triamcinolone cream (KENALOG) 0.1 %     . pyridOXINE (VITAMIN B-6) 100 MG tablet Take 100 mg by mouth daily.      No current facility-administered medications for this visit.    PHYSICAL EXAMINATION: ECOG PERFORMANCE STATUS: 0 - Asymptomatic  Filed Vitals:   08/12/14 0934  BP: 144/81  Pulse: 71  Temp: 98.1 F (36.7 C)  Resp: 18   Filed Weights   08/12/14 0934  Weight: 190 lb 4.8 oz (86.32 kg)     GENERAL:alert, no distress and comfortable SKIN: skin color, texture, turgor are normal, no rashes or significant lesions EYES: normal, Conjunctiva are pink and non-injected, sclera clear OROPHARYNX:no exudate, no erythema and lips, buccal mucosa, and tongue normal  NECK: supple, thyroid normal size, non-tender, without nodularity LYMPH:  no palpable lymphadenopathy in the cervical, axillary or inguinal LUNGS: clear to auscultation and percussion with normal breathing effort HEART: regular rate & rhythm and no murmurs and no lower extremity edema ABDOMEN:abdomen soft, non-tender and normal bowel sounds Musculoskeletal:no cyanosis of digits and no clubbing  NEURO: alert & oriented x 3 with fluent speech, no focal motor/sensory deficits  LABORATORY DATA:  I have reviewed the data as listed    Component Value Date/Time   NA 140 08/12/2014 0903   NA 140 10/14/2013 1455   K 4.0 08/12/2014 0903   K 3.5* 10/14/2013 1455   CL 101 10/14/2013 1455   CO2 28 08/12/2014 0903   CO2 27 10/14/2013 1455   GLUCOSE 83 08/12/2014 0903   GLUCOSE 99 10/14/2013 1455   BUN 17.5 08/12/2014 0903   BUN 26* 10/14/2013 1455   CREATININE 1.0 08/12/2014 0903   CREATININE 0.91 10/14/2013 1455   CALCIUM 9.5  08/12/2014 0903   CALCIUM 9.0 10/14/2013 1455   PROT 6.6 08/12/2014 0903   PROT 5.9* 10/14/2013 1455   ALBUMIN 4.0 08/12/2014 0903   ALBUMIN 3.7 10/14/2013 1455   AST 29 08/12/2014 0903   AST 14 10/14/2013 1455   ALT 27 08/12/2014 0903   ALT 16 10/14/2013 1455   ALKPHOS 91 08/12/2014 0903   ALKPHOS 94 10/14/2013 1455   BILITOT 0.47 08/12/2014 0903   BILITOT 0.5 10/14/2013 1455   GFRNONAA 63* 10/14/2013 1455   GFRAA 73* 10/14/2013 1455    No results found for: SPEP, UPEP  Lab Results  Component Value Date   WBC 2.8* 08/12/2014   NEUTROABS 1.6 08/12/2014   HGB 12.3 08/12/2014   HCT 37.5 08/12/2014   MCV 84.2 08/12/2014   PLT 214 08/12/2014      Chemistry      Component Value  Date/Time   NA 140 08/12/2014 0903   NA 140 10/14/2013 1455   K 4.0 08/12/2014 0903   K 3.5* 10/14/2013 1455   CL 101 10/14/2013 1455   CO2 28 08/12/2014 0903   CO2 27 10/14/2013 1455   BUN 17.5 08/12/2014 0903   BUN 26* 10/14/2013 1455   CREATININE 1.0 08/12/2014 0903   CREATININE 0.91 10/14/2013 1455      Component Value Date/Time   CALCIUM 9.5 08/12/2014 0903   CALCIUM 9.0 10/14/2013 1455   ALKPHOS 91 08/12/2014 0903   ALKPHOS 94 10/14/2013 1455   AST 29 08/12/2014 0903   AST 14 10/14/2013 1455   ALT 27 08/12/2014 0903   ALT 16 10/14/2013 1455   BILITOT 0.47 08/12/2014 0903   BILITOT 0.5 10/14/2013 1455      ASSESSMENT & PLAN:  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 I plan to repeat imaging study prior to her next visit.  Leukopenia This is related to side effects of treatment. She is not symptomatic. Observed only.  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  Syncope Recent echocardiogram showed preserved ejection fraction. She will continue medical management with beta blocker and aspirin.   Orders Placed This Encounter  Procedures  . CT Chest W Contrast    Standing Status: Future     Number of Occurrences:      Standing Expiration Date: 10/12/2015    Order Specific Question:  Reason for Exam (SYMPTOM  OR DIAGNOSIS REQUIRED)    Answer:  lymphoma staging    Order Specific Question:  Preferred imaging location?    Answer:  [facility]  . CT Abdomen Pelvis W Contrast    Standing Status: Future     Number of Occurrences:      Standing Expiration Date: 11/12/2015    Order Specific Question:  Reason for Exam (SYMPTOM  OR DIAGNOSIS REQUIRED)    Answer:  lymphoma staging    Order Specific Question:  Preferred imaging location?    Answer:  [facility]  . CBC with Differential    Standing Status: Future     Number of Occurrences:       Standing Expiration Date: 10/12/2015  . Comprehensive metabolic panel    Standing Status: Future     Number of Occurrences:      Standing Expiration Date: 10/12/2015  . Lactate dehydrogenase    Standing Status: Future     Number of Occurrences:      Standing Expiration Date: 10/12/2015   All  questions were answered. The patient knows to call the clinic with any problems, questions or concerns. No barriers to learning was detected. I spent 30 minutes counseling the patient face to face. The total time spent in the appointment was 40 minutes and more than 50% was on counseling and review of test results     [facility], [place], MD 08/12/2014 9:03 PM

## 2014-08-12 NOTE — Assessment & Plan Note (Signed)
She tolerated treatment well. I plan to repeat imaging study prior to her next visit.

## 2014-08-12 NOTE — Assessment & Plan Note (Signed)
She will continue close follow-up with the hematologist.

## 2014-08-12 NOTE — Assessment & Plan Note (Signed)
Recent echocardiogram showed preserved ejection fraction. She will continue medical management with beta blocker and aspirin.

## 2014-08-21 DIAGNOSIS — L609 Nail disorder, unspecified: Secondary | ICD-10-CM | POA: Diagnosis not present

## 2014-08-21 DIAGNOSIS — L748 Other eccrine sweat disorders: Secondary | ICD-10-CM | POA: Diagnosis not present

## 2014-08-24 DIAGNOSIS — C8199 Hodgkin lymphoma, unspecified, extranodal and solid organ sites: Secondary | ICD-10-CM | POA: Diagnosis not present

## 2014-08-24 DIAGNOSIS — I10 Essential (primary) hypertension: Secondary | ICD-10-CM | POA: Diagnosis not present

## 2014-08-25 ENCOUNTER — Telehealth: Payer: Self-pay | Admitting: *Deleted

## 2014-08-25 NOTE — Telephone Encounter (Signed)
Pt left VM checking on her schedule.  She has not been scheduled yet for Rituxan in Feb when she returns to see Dr. Alvy Bimler on 2/3.  Sent message to scheduling to f/u.

## 2014-08-25 NOTE — Telephone Encounter (Signed)
Informed pt of Rituxan added on to appt on 2/3.  She verbalized understanding.

## 2014-09-10 ENCOUNTER — Emergency Department (HOSPITAL_COMMUNITY)
Admission: EM | Admit: 2014-09-10 | Discharge: 2014-09-10 | Disposition: A | Discharge status: Home / Self Care | Payer: Medicare Other | Attending: Emergency Medicine | Admitting: Emergency Medicine

## 2014-09-10 ENCOUNTER — Encounter (HOSPITAL_COMMUNITY): Payer: Self-pay | Admitting: Emergency Medicine

## 2014-09-10 ENCOUNTER — Emergency Department (HOSPITAL_COMMUNITY): Payer: Medicare Other

## 2014-09-10 DIAGNOSIS — I10 Essential (primary) hypertension: Secondary | ICD-10-CM | POA: Insufficient documentation

## 2014-09-10 DIAGNOSIS — M549 Dorsalgia, unspecified: Secondary | ICD-10-CM | POA: Diagnosis not present

## 2014-09-10 DIAGNOSIS — Z91040 Latex allergy status: Secondary | ICD-10-CM | POA: Diagnosis not present

## 2014-09-10 DIAGNOSIS — R110 Nausea: Secondary | ICD-10-CM | POA: Insufficient documentation

## 2014-09-10 DIAGNOSIS — Z8572 Personal history of non-Hodgkin lymphomas: Secondary | ICD-10-CM | POA: Insufficient documentation

## 2014-09-10 DIAGNOSIS — Z8619 Personal history of other infectious and parasitic diseases: Secondary | ICD-10-CM | POA: Insufficient documentation

## 2014-09-10 DIAGNOSIS — I2510 Atherosclerotic heart disease of native coronary artery without angina pectoris: Secondary | ICD-10-CM | POA: Insufficient documentation

## 2014-09-10 DIAGNOSIS — Z79899 Other long term (current) drug therapy: Secondary | ICD-10-CM | POA: Insufficient documentation

## 2014-09-10 DIAGNOSIS — R079 Chest pain, unspecified: Secondary | ICD-10-CM

## 2014-09-10 DIAGNOSIS — Z7982 Long term (current) use of aspirin: Secondary | ICD-10-CM | POA: Insufficient documentation

## 2014-09-10 DIAGNOSIS — K219 Gastro-esophageal reflux disease without esophagitis: Secondary | ICD-10-CM | POA: Diagnosis not present

## 2014-09-10 DIAGNOSIS — D649 Anemia, unspecified: Secondary | ICD-10-CM | POA: Diagnosis not present

## 2014-09-10 DIAGNOSIS — R0602 Shortness of breath: Secondary | ICD-10-CM | POA: Insufficient documentation

## 2014-09-10 DIAGNOSIS — Z87440 Personal history of urinary (tract) infections: Secondary | ICD-10-CM | POA: Diagnosis not present

## 2014-09-10 DIAGNOSIS — Z8639 Personal history of other endocrine, nutritional and metabolic disease: Secondary | ICD-10-CM | POA: Diagnosis not present

## 2014-09-10 DIAGNOSIS — R0789 Other chest pain: Secondary | ICD-10-CM | POA: Diagnosis not present

## 2014-09-10 LAB — CBC WITH DIFFERENTIAL/PLATELET
Basophils Absolute: 0 10*3/uL (ref 0.0–0.1)
Basophils Relative: 1 % (ref 0–1)
EOS ABS: 0.1 10*3/uL (ref 0.0–0.7)
Eosinophils Relative: 4 % (ref 0–5)
HCT: 39.7 % (ref 36.0–46.0)
HEMOGLOBIN: 13.3 g/dL (ref 12.0–15.0)
Lymphocytes Relative: 28 % (ref 12–46)
Lymphs Abs: 0.7 10*3/uL (ref 0.7–4.0)
MCH: 28.1 pg (ref 26.0–34.0)
MCHC: 33.5 g/dL (ref 30.0–36.0)
MCV: 83.9 fL (ref 78.0–100.0)
MONO ABS: 0.3 10*3/uL (ref 0.1–1.0)
MONOS PCT: 12 % (ref 3–12)
NEUTROS PCT: 55 % (ref 43–77)
Neutro Abs: 1.4 10*3/uL — ABNORMAL LOW (ref 1.7–7.7)
Platelets: 201 10*3/uL (ref 150–400)
RBC: 4.73 MIL/uL (ref 3.87–5.11)
RDW: 14.3 % (ref 11.5–15.5)
WBC: 2.5 10*3/uL — ABNORMAL LOW (ref 4.0–10.5)

## 2014-09-10 LAB — URINALYSIS, ROUTINE W REFLEX MICROSCOPIC
Bilirubin Urine: NEGATIVE
GLUCOSE, UA: NEGATIVE mg/dL
Hgb urine dipstick: NEGATIVE
KETONES UR: NEGATIVE mg/dL
Nitrite: NEGATIVE
PH: 6.5 (ref 5.0–8.0)
Protein, ur: NEGATIVE mg/dL
Urobilinogen, UA: 0.2 mg/dL (ref 0.0–1.0)

## 2014-09-10 LAB — URINE MICROSCOPIC-ADD ON

## 2014-09-10 LAB — COMPREHENSIVE METABOLIC PANEL
ALK PHOS: 81 U/L (ref 39–117)
ALT: 29 U/L (ref 0–35)
ANION GAP: 8 (ref 5–15)
AST: 31 U/L (ref 0–37)
Albumin: 4.5 g/dL (ref 3.5–5.2)
BUN: 12 mg/dL (ref 6–23)
CO2: 27 mmol/L (ref 19–32)
CREATININE: 0.88 mg/dL (ref 0.50–1.10)
Calcium: 9.1 mg/dL (ref 8.4–10.5)
Chloride: 104 mEq/L (ref 96–112)
GFR calc Af Amer: 76 mL/min — ABNORMAL LOW (ref >90)
GFR calc non Af Amer: 66 mL/min — ABNORMAL LOW (ref >90)
GLUCOSE: 97 mg/dL (ref 70–99)
POTASSIUM: 3.5 mmol/L (ref 3.5–5.1)
Sodium: 139 mmol/L (ref 135–145)
Total Bilirubin: 0.9 mg/dL (ref 0.3–1.2)
Total Protein: 6.8 g/dL (ref 6.0–8.3)

## 2014-09-10 LAB — TROPONIN I

## 2014-09-10 LAB — LIPASE, BLOOD: Lipase: 23 U/L (ref 11–59)

## 2014-09-10 MED ORDER — GI COCKTAIL ~~LOC~~
30.0000 mL | Freq: Once | ORAL | Status: AC
  Administered 2014-09-10: 30 mL via ORAL
  Filled 2014-09-10: qty 30

## 2014-09-10 MED ORDER — ASPIRIN 81 MG PO CHEW
324.0000 mg | CHEWABLE_TABLET | Freq: Once | ORAL | Status: AC
  Administered 2014-09-10: 324 mg via ORAL
  Filled 2014-09-10: qty 4

## 2014-09-10 MED ORDER — ASPIRIN 81 MG PO CHEW
CHEWABLE_TABLET | ORAL | Status: AC
  Filled 2014-09-10: qty 1

## 2014-09-10 NOTE — ED Notes (Signed)
MD at bedside. 

## 2014-09-10 NOTE — ED Notes (Signed)
PT stated she started having left sided chest pain intermittently for the past two weeks and describes it as a tightness and feels like bad acid reflux. PT stated she has lymphoma and had a tx 08/12/14.
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
## 2014-09-10 NOTE — Discharge Instructions (Signed)
Chest Pain (Nonspecific) Follow up with your doctor on January 5 at 1:50. Continue to take your medications. Return to the ED if you develop new or worsening symptoms. It is often hard to give a specific diagnosis for the cause of chest pain. There is always a chance that your pain could be related to something serious, such as a heart attack or a blood clot in the lungs. You need to follow up with your health care provider for further evaluation. CAUSES   Heartburn.  Pneumonia or bronchitis.  Anxiety or stress.  Inflammation around your heart (pericarditis) or lung (pleuritis or pleurisy).  A blood clot in the lung.  A collapsed lung (pneumothorax). It can develop suddenly on its own (spontaneous pneumothorax) or from trauma to the chest.  Shingles infection (herpes zoster virus). The chest wall is composed of bones, muscles, and cartilage. Any of these can be the source of the pain.  The bones can be bruised by injury.  The muscles or cartilage can be strained by coughing or overwork.  The cartilage can be affected by inflammation and become sore (costochondritis). DIAGNOSIS  Lab tests or other studies may be needed to find the cause of your pain. Your health care provider may have you take a test called an ambulatory electrocardiogram (ECG). An ECG records your heartbeat patterns over a 24-hour period. You may also have other tests, such as:  Transthoracic echocardiogram (TTE). During echocardiography, sound waves are used to evaluate how blood flows through your heart.  Transesophageal echocardiogram (TEE).  Cardiac monitoring. This allows your health care provider to monitor your heart rate and rhythm in real time.  Holter monitor. This is a portable device that records your heartbeat and can help diagnose heart arrhythmias. It allows your health care provider to track your heart activity for several days, if needed.  Stress tests by exercise or by giving medicine that makes  the heart beat faster. TREATMENT   Treatment depends on what may be causing your chest pain. Treatment may include:  Acid blockers for heartburn.  Anti-inflammatory medicine.  Pain medicine for inflammatory conditions.  Antibiotics if an infection is present.  You may be advised to change lifestyle habits. This includes stopping smoking and avoiding alcohol, caffeine, and chocolate.  You may be advised to keep your head raised (elevated) when sleeping. This reduces the chance of acid going backward from your stomach into your esophagus. Most of the time, nonspecific chest pain will improve within 2-3 days with rest and mild pain medicine.  HOME CARE INSTRUCTIONS   If antibiotics were prescribed, take them as directed. Finish them even if you start to feel better.  For the next few days, avoid physical activities that bring on chest pain. Continue physical activities as directed.  Do not use any tobacco products, including cigarettes, chewing tobacco, or electronic cigarettes.  Avoid drinking alcohol.  Only take medicine as directed by your health care provider.  Follow your health care provider's suggestions for further testing if your chest pain does not go away.  Keep any follow-up appointments you made. If you do not go to an appointment, you could develop lasting (chronic) problems with pain. If there is any problem keeping an appointment, call to reschedule. SEEK MEDICAL CARE IF:   Your chest pain does not go away, even after treatment.  You have a rash with blisters on your chest.  You have a fever. SEEK IMMEDIATE MEDICAL CARE IF:   You have increased chest pain or  pain that spreads to your arm, neck, jaw, back, or abdomen.  You have shortness of breath.  You have an increasing cough, or you cough up blood.  You have severe back or abdominal pain.  You feel nauseous or vomit.  You have severe weakness.  You faint.  You have chills. This is an emergency.  Do not wait to see if the pain will go away. Get medical help at once. Call your local emergency services (911 in [place] Do not drive yourself to the hospital. MAKE SURE YOU:   Understand these instructions.  Will watch your condition.  Will get help right away if you are not doing well or get worse. Document Released: 06/07/2005 Document Revised: 09/02/2013 Document Reviewed: 04/02/2008 [facility] Patient Information 2015 [place], [place]. This information is not intended to replace advice given to you by your health care provider. Make sure you discuss any questions you have with your health care provider.

## 2014-09-15 ENCOUNTER — Ambulatory Visit: Payer: Medicare Other | Admitting: Adult Health

## 2014-09-16 DIAGNOSIS — M1711 Unilateral primary osteoarthritis, right knee: Secondary | ICD-10-CM | POA: Diagnosis not present

## 2014-09-17 ENCOUNTER — Ambulatory Visit (INDEPENDENT_AMBULATORY_CARE_PROVIDER_SITE_OTHER): Payer: Medicare Other | Admitting: Adult Health

## 2014-09-17 ENCOUNTER — Encounter: Payer: Self-pay | Admitting: Adult Health

## 2014-09-17 VITALS — BP 138/88 | HR 67 | Ht 62.0 in | Wt 190.3 lb

## 2014-09-17 DIAGNOSIS — I10 Essential (primary) hypertension: Secondary | ICD-10-CM

## 2014-09-17 DIAGNOSIS — R109 Unspecified abdominal pain: Secondary | ICD-10-CM | POA: Diagnosis not present

## 2014-09-17 DIAGNOSIS — Q245 Malformation of coronary vessels: Secondary | ICD-10-CM | POA: Diagnosis not present

## 2014-09-17 NOTE — Assessment & Plan Note (Signed)
Blood pressure is well controlled on current medication regimen. Will continue current doses.She will be seen in 6 months by Dr. Percival Spanish or our office for ongoing management,.

## 2014-09-17 NOTE — Assessment & Plan Note (Signed)
Do not think her symptoms are related to cardiac etiology. She is to be referred to Dr. Laural Golden, as she has seen him in the past for GI issues,

## 2014-09-17 NOTE — Patient Instructions (Signed)
Your physician wants you to follow-up in: 6 months. You will receive a reminder letter in the mail two months in advance. If you don't receive a letter, please call our office to schedule the follow-up appointment.  Your physician recommends that you continue on your current medications as directed. Please refer to the Current Medication list given to you today.  You have been referred to Dr. Laural Golden  Thank you for choosing [facility]!

## 2014-09-17 NOTE — Progress Notes (Signed)
HPI: [name] is a 69 year old patient of Dr. Percival Spanish, that we follow for ongoing assessment and management of CAD, hypertension, hyperlipidemia.  She was last seen by Dr. Percival Spanish in November 2015, was found to be stable from a cardiac standpoint.  She had a recent perfusion study in 2011.  That was normal.  She is intolerant of statins and is on red yeast rice only.  She did have one episode of syncope in the past, which was thought to be vagal.  Unfortunately, she was seen at [facility] emergency room with complaints of chest pain.  Describing it as midsternal chest discomfort, lasting for several hours going on for 2 weeks.  Lying on her back aggravated the pain.  Chest x-ray was negative for acute cardiopulmonary findings, EKG revealed normal sinus rhythm.  Troponin was negative.  Was felt that the pain was atypical for cardiac in etiology.  She was treated with a GI cocktail and symptoms resolved.  She was continued on PPI.  She was to followup for a repeat stress test.  She continues to have GI symptoms with diarrhea and nausea.   Allergies  Allergen Reactions  . Latex Swelling and Rash    Current Outpatient Prescriptions  Medication Sig Dispense Refill  . acetaminophen (TYLENOL) 500 MG tablet Take 250 mg by mouth every 6 (six) hours as needed for moderate pain.    Marland Kitchen aspirin 81 MG tablet Take 81 mg by mouth at bedtime.     Marland Kitchen CALCIUM-MAGNESIUM-ZINC PO Take 1 tablet by mouth daily.     . Cholecalciferol (VITAMIN D) 2000 UNITS tablet Take 2,000 Units by mouth daily.    . clobetasol cream (TEMOVATE) 1.61 % Apply 1 application topically daily. Apply to affected area    . docusate sodium (COLACE) 100 MG capsule Take 1 capsule (100 mg total) by mouth 2 (two) times daily. (Patient taking differently: Take 100 mg by mouth daily as needed for mild constipation. ) 100 capsule 1  . lansoprazole (PREVACID) 30 MG capsule Take 30 mg by mouth daily.     Marland Kitchen lidocaine-prilocaine (EMLA) cream Apply  topically as needed. 30 g 0  . loratadine (CLARITIN) 10 MG tablet Take 10 mg by mouth daily.    Marland Kitchen losartan-hydrochlorothiazide (HYZAAR) 100-12.5 MG per tablet     . ondansetron (ZOFRAN) 8 MG tablet Take 8 mg by mouth every 8 (eight) hours as needed for nausea or vomiting.    . potassium chloride SA (K-DUR,KLOR-CON) 20 MEQ tablet Take 20 mEq by mouth daily.     . Probiotic Product (PROBIOTIC DAILY PO) Take 1 capsule by mouth daily.    Marland Kitchen pyridOXINE (VITAMIN B-6) 100 MG tablet Take 100 mg by mouth daily.     . Red Yeast Rice 600 MG CAPS Take 1 capsule by mouth every evening.      No current facility-administered medications for this visit.    Past Medical History  Diagnosis Date  . CAD (coronary artery disease)   . GERD (gastroesophageal reflux disease)   . HTN (hypertension)   . Hyperlipidemia   . Enlargement of lymph nodes 07/26/2011  . 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 Lymphoma   . Tick bite of knee   . Thyroid disease   . Follicular lymphoma 09/60/4540  . 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  B-cell, favor follicular NHL.  CD79a, CD20 and CD10 positive.  Cyclin D1 negative.   . Mouth sores 07/18/2013  . Fever 07/23/2013  . Fungal infection 08/01/2013  . Hemorrhoids 08/05/2013  . Leukopenia 09/16/2013  . 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 Skull deformity 10/08/2013  . Broken tooth 10/17/2013  . Infection of lip 10/27/2013  . UTI (urinary tract infection) 11/10/2013    Past Surgical History  Procedure Laterality Date  . Mr breast bilateral  cyst removal  . Abdominal hysterectomy    . Lymph node dissection  x 3 different times    behind left ear 2012/ left clavical area  . Tubal ligation    . Knee arthroscopy w/ debridement  05/2011    right  . Bone marrow biopsy  05/09/2013  . Bone marrow aspiration  05/09/2013    ROS: Complete review of  systems performed and found to be negative unless outlined above  PHYSICAL EXAM BP 138/88 mmHg  Pulse 67  Ht 5' 2"  (1.575 m)  Wt 190 lb 4.8 oz (86.32 kg)  BMI 34.80 kg/m2  SpO2 99% General: Well developed, well nourished, in no acute distress Head: Eyes PERRLA, No xanthomas.   Normal cephalic and atramatic  Lungs: Clear bilaterally to auscultation and percussion. Heart: HRRR S1 S2, without MRG.  Pulses are 2+ & equal.            No carotid bruit. No JVD.  No abdominal bruits. No femoral bruits. Abdomen: Bowel sounds are positive, abdomen soft and non-tender without masses or Hernia's noted. Negative Murphy's sign. Msk:  Back normal, normal gait. Normal strength and tone for age. Extremities: No clubbing, cyanosis or edema.  DP +1 Neuro: Alert and oriented X 3. Psych:  Good affect, responds appropriately   ASSESSMENT AND PLAN

## 2014-09-17 NOTE — Progress Notes (Deleted)
Name: [name]    DOB: 1945-12-09  Age: 69 y.o.  MR#: [id number]       PCP:  Maggie Font, MD      Insurance: Payor: MEDICARE / Plan: MEDICARE PART A AND B / Product Type: *No Product type* /   CC:    Chief Complaint  Patient presents with  . Coronary Artery Disease  . Hypertension  . Hyperlipidemia    VS Filed Vitals:   09/17/14 1416  BP: 138/88  Pulse: 67  Height: 5\' 2"  (1.575 m)  Weight: 190 lb 4.8 oz (86.32 kg)  SpO2: 99%    Weights Current Weight  09/17/14 190 lb 4.8 oz (86.32 kg)  09/10/14 190 lb (86.183 kg)  08/12/14 190 lb 4.8 oz (86.32 kg)    Blood Pressure  BP Readings from Last 3 Encounters:  09/17/14 138/88  09/10/14 141/83  08/12/14 154/80     Admit date:  (Not on file) Last encounter with RMR:  Visit date not found   Allergy Latex  Current Outpatient Prescriptions  Medication Sig Dispense Refill  . acetaminophen (TYLENOL) 500 MG tablet Take 250 mg by mouth every 6 (six) hours as needed for moderate pain.    Marland Kitchen aspirin 81 MG tablet Take 81 mg by mouth at bedtime.     Marland Kitchen CALCIUM-MAGNESIUM-ZINC PO Take 1 tablet by mouth daily.     . Cholecalciferol (VITAMIN D) 2000 UNITS tablet Take 2,000 Units by mouth daily.    . clobetasol cream (TEMOVATE) 4.48 % Apply 1 application topically daily. Apply to affected area    . docusate sodium (COLACE) 100 MG capsule Take 1 capsule (100 mg total) by mouth 2 (two) times daily. (Patient taking differently: Take 100 mg by mouth daily as needed for mild constipation. ) 100 capsule 1  . lansoprazole (PREVACID) 30 MG capsule Take 30 mg by mouth daily.     Marland Kitchen lidocaine-prilocaine (EMLA) cream Apply topically as needed. 30 g 0  . loratadine (CLARITIN) 10 MG tablet Take 10 mg by mouth daily.    Marland Kitchen losartan-hydrochlorothiazide (HYZAAR) 100-12.5 MG per tablet     . ondansetron (ZOFRAN) 8 MG tablet Take 8 mg by mouth every 8 (eight) hours as needed for nausea or vomiting.    . potassium chloride SA (K-DUR,KLOR-CON) 20 MEQ tablet  Take 20 mEq by mouth daily.     . Probiotic Product (PROBIOTIC DAILY PO) Take 1 capsule by mouth daily.    Marland Kitchen pyridOXINE (VITAMIN B-6) 100 MG tablet Take 100 mg by mouth daily.     . Red Yeast Rice 600 MG CAPS Take 1 capsule by mouth every evening.      No current facility-administered medications for this visit.    Discontinued Meds:    Medications Discontinued During This Encounter  Medication Reason  . Alum & Mag Hydroxide-Simeth (MAGIC MOUTHWASH W/LIDOCAINE) SOLN Error  . ibuprofen (ADVIL,MOTRIN) 200 MG tablet Error    Patient Active Problem List   Diagnosis Date Noted  . 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 Edema 02/18/2014  . Skin lesion of left leg 02/18/2014  . Broken tooth 10/17/2013  . Syncope 10/14/2013  . Skull deformity 10/08/2013  . Lichen sclerosus 18/56/3149  . Leukopenia 09/16/2013  . 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 Hemorrhoids 08/05/2013  . Insomnia 07/25/2013  . 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  Renal mass, left 08/07/2011  . Liver mass, right lobe 07/27/2011  . OVERWEIGHT 12/27/2009  . CRAMP OF LIMB 11/16/2008  . HYPERLIPIDEMIA 11/15/2008  . ESSENTIAL HYPERTENSION, BENIGN 11/15/2008  . CORONARY ARTERY ANOMALY, CONGENITAL 11/15/2008  . KNEE PAIN 03/30/2008  . ANSERINE BURSITIS 03/30/2008  . FINGER SPRAIN 01/06/2008    LABS    Component Value Date/Time   NA 139 09/10/2014 1012   NA 140 08/12/2014 0903   NA 141 06/10/2014 0912   NA 140 04/15/2014 0758   NA 140 10/14/2013 1455   NA 140 07/27/2013 0550   K 3.5 09/10/2014 1012   K 4.0 08/12/2014 0903   K 3.9 06/10/2014 0912   K 4.1 04/15/2014 0758   K 3.5* 10/14/2013 1455   K 3.6 07/27/2013 0550   CL 104 09/10/2014 1012   CL 101 10/14/2013 1455   CL 107 07/27/2013 0550   CO2 27 09/10/2014 1012   CO2 28 08/12/2014 0903   CO2 27 06/10/2014 0912   CO2 26  04/15/2014 0758   CO2 27 10/14/2013 1455   CO2 25 07/27/2013 0550   GLUCOSE 97 09/10/2014 1012   GLUCOSE 83 08/12/2014 0903   GLUCOSE 89 06/10/2014 0912   GLUCOSE 97 04/15/2014 0758   GLUCOSE 99 10/14/2013 1455   GLUCOSE 101* 07/27/2013 0550   BUN 12 09/10/2014 1012   BUN 17.5 08/12/2014 0903   BUN 15.0 06/10/2014 0912   BUN 24.6 04/15/2014 0758   BUN 26* 10/14/2013 1455   BUN 12 07/27/2013 0550   CREATININE 0.88 09/10/2014 1012   CREATININE 1.0 08/12/2014 0903   CREATININE 1.0 06/10/2014 0912   CREATININE 1.2* 04/15/2014 0758   CREATININE 0.91 10/14/2013 1455   CREATININE 0.84 07/27/2013 0550   CALCIUM 9.1 09/10/2014 1012   CALCIUM 9.5 08/12/2014 0903   CALCIUM 9.5 06/10/2014 0912   CALCIUM 9.4 04/15/2014 0758   CALCIUM 9.0 10/14/2013 1455   CALCIUM 8.6 07/27/2013 0550   GFRNONAA 66* 09/10/2014 1012   GFRNONAA 63* 10/14/2013 1455   GFRNONAA 70* 07/27/2013 0550   GFRAA 76* 09/10/2014 1012   GFRAA 73* 10/14/2013 1455   GFRAA 82* 07/27/2013 0550   CMP     Component Value Date/Time   NA 139 09/10/2014 1012   NA 140 08/12/2014 0903   K 3.5 09/10/2014 1012   K 4.0 08/12/2014 0903   CL 104 09/10/2014 1012   CO2 27 09/10/2014 1012   CO2 28 08/12/2014 0903   GLUCOSE 97 09/10/2014 1012   GLUCOSE 83 08/12/2014 0903   BUN 12 09/10/2014 1012   BUN 17.5 08/12/2014 0903   CREATININE 0.88 09/10/2014 1012   CREATININE 1.0 08/12/2014 0903   CALCIUM 9.1 09/10/2014 1012   CALCIUM 9.5 08/12/2014 0903   PROT 6.8 09/10/2014 1012   PROT 6.6 08/12/2014 0903   ALBUMIN 4.5 09/10/2014 1012   ALBUMIN 4.0 08/12/2014 0903   AST 31 09/10/2014 1012   AST 29 08/12/2014 0903   ALT 29 09/10/2014 1012   ALT 27 08/12/2014 0903   ALKPHOS 81 09/10/2014 1012   ALKPHOS 91 08/12/2014 0903   BILITOT 0.9 09/10/2014 1012   BILITOT 0.47 08/12/2014 0903   GFRNONAA 66* 09/10/2014 1012   GFRAA 76* 09/10/2014 1012       Component Value Date/Time   WBC 2.5* 09/10/2014 1012   WBC 2.8* 08/12/2014  0903   WBC 3.4* 06/10/2014 0913   WBC 3.1* 04/15/2014 0758   WBC 2.8* 10/14/2013 1455   WBC 12.7* 07/27/2013 0550  HGB 13.3 09/10/2014 1012   HGB 12.3 08/12/2014 0903   HGB 12.3 06/10/2014 0913   HGB 12.6 04/15/2014 0758   HGB 10.3* 10/14/2013 1455   HGB 10.2* 07/27/2013 0550   HCT 39.7 09/10/2014 1012   HCT 37.5 08/12/2014 0903   HCT 36.6 06/10/2014 0913   HCT 38.3 04/15/2014 0758   HCT 29.7* 10/14/2013 1455   HCT 28.9* 07/27/2013 0550   MCV 83.9 09/10/2014 1012   MCV 84.2 08/12/2014 0903   MCV 82.8 06/10/2014 0913   MCV 82.0 04/15/2014 0758   MCV 88.4 10/14/2013 1455   MCV 82.1 07/27/2013 0550    Lipid Panel  No results found for: CHOL, TRIG, HDL, CHOLHDL, VLDL, LDLCALC, LDLDIRECT  ABG No results found for: PHART, PCO2ART, PO2ART, HCO3, TCO2, ACIDBASEDEF, O2SAT   Lab Results  Component Value Date   TSH 1.615 10/14/2013   BNP (last 3 results) No results for input(s): PROBNP in the last 8760 hours. Cardiac Panel (last 3 results) No results for input(s): CKTOTAL, CKMB, TROPONINI, RELINDX in the last 72 hours.  Iron/TIBC/Ferritin/ %Sat No results found for: IRON, TIBC, FERRITIN, IRONPCTSAT   EKG Orders placed or performed during the hospital encounter of 09/10/14  . EKG 12-Lead  . EKG 12-Lead  . ED EKG  . ED EKG  . 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 I plan to repeat imaging study prior to her next visit.    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 She is not symptomatic. Observed only.    Anemia in neoplastic disease   Lichen sclerosus   Skull deformity   Syncope   Last Assessment & Plan   08/12/2014 Office Visit Written 08/12/2014  9:03 PM by Heath Lark, MD    Recent echocardiogram showed preserved ejection fraction. She will continue medical management with beta blocker and aspirin.    Broken tooth   Edema   Last Assessment & Plan   02/18/2014 Office Visit Written 02/18/2014  1:25 PM by Heath Lark, MD    This is due to fluid retention. Recommend increase mobility and reduced salt intake.    Skin lesion of left leg   Last Assessment & Plan   02/18/2014 Office Visit Written 02/18/2014  1:26 PM by Heath Lark, MD    This is suspicious for melanoma. I recommend referral back to dermatologist for resection.    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        Imaging: Dg Chest 2 View  09/10/2014   CLINICAL DATA:  Mid  chest pain shortness of breath for 2 weeks. History of lymphoma.  EXAM: CHEST  2 VIEW  COMPARISON:  11/03/2013  FINDINGS: The power port is stable. The cardiac silhouette, mediastinal hilar contours are within limits and unchanged. The lungs are clear. No pleural effusion. The bony thorax is intact.  IMPRESSION: No acute cardiopulmonary findings.   Electronically Signed   By: Kalman Jewels M.D.   On: 09/10/2014 10:25

## 2014-09-29 DIAGNOSIS — E784 Other hyperlipidemia: Secondary | ICD-10-CM | POA: Diagnosis not present

## 2014-09-29 DIAGNOSIS — C8590 Non-Hodgkin lymphoma, unspecified, unspecified site: Secondary | ICD-10-CM | POA: Diagnosis not present

## 2014-09-29 DIAGNOSIS — K219 Gastro-esophageal reflux disease without esophagitis: Secondary | ICD-10-CM | POA: Diagnosis not present

## 2014-09-30 ENCOUNTER — Encounter (INDEPENDENT_AMBULATORY_CARE_PROVIDER_SITE_OTHER): Payer: Self-pay | Admitting: *Deleted

## 2014-10-12 ENCOUNTER — Other Ambulatory Visit (HOSPITAL_BASED_OUTPATIENT_CLINIC_OR_DEPARTMENT_OTHER): Payer: Medicare Other

## 2014-10-12 ENCOUNTER — Ambulatory Visit (HOSPITAL_COMMUNITY)
Admission: RE | Admit: 2014-10-12 | Discharge: 2014-10-12 | Disposition: A | Discharge status: Home / Self Care | Payer: Medicare Other | Source: Ambulatory Visit | Attending: Hematology and Oncology | Admitting: Hematology and Oncology

## 2014-10-12 ENCOUNTER — Encounter (HOSPITAL_COMMUNITY): Payer: Self-pay

## 2014-10-12 ENCOUNTER — Other Ambulatory Visit: Payer: Medicare Other

## 2014-10-12 DIAGNOSIS — K5730 Diverticulosis of large intestine without perforation or abscess without bleeding: Secondary | ICD-10-CM | POA: Diagnosis not present

## 2014-10-12 DIAGNOSIS — Z79899 Other long term (current) drug therapy: Secondary | ICD-10-CM | POA: Insufficient documentation

## 2014-10-12 DIAGNOSIS — C8239 Follicular lymphoma grade IIIa, extranodal and solid organ sites: Secondary | ICD-10-CM | POA: Diagnosis not present

## 2014-10-12 DIAGNOSIS — J984 Other disorders of lung: Secondary | ICD-10-CM | POA: Diagnosis not present

## 2014-10-12 DIAGNOSIS — C8590 Non-Hodgkin lymphoma, unspecified, unspecified site: Secondary | ICD-10-CM | POA: Diagnosis not present

## 2014-10-12 LAB — COMPREHENSIVE METABOLIC PANEL (CC13)
ALBUMIN: 4.3 g/dL (ref 3.5–5.0)
ALT: 22 U/L (ref 0–55)
AST: 24 U/L (ref 5–34)
Alkaline Phosphatase: 82 U/L (ref 40–150)
Anion Gap: 12 mEq/L — ABNORMAL HIGH (ref 3–11)
BILIRUBIN TOTAL: 0.66 mg/dL (ref 0.20–1.20)
BUN: 18 mg/dL (ref 7.0–26.0)
CHLORIDE: 104 meq/L (ref 98–109)
CO2: 27 meq/L (ref 22–29)
CREATININE: 1.1 mg/dL (ref 0.6–1.1)
Calcium: 9.2 mg/dL (ref 8.4–10.4)
EGFR: 62 mL/min/{1.73_m2} — AB (ref >90)
Glucose: 91 mg/dl (ref 70–140)
Potassium: 4 mEq/L (ref 3.5–5.1)
Sodium: 143 mEq/L (ref 136–145)
TOTAL PROTEIN: 6.9 g/dL (ref 6.4–8.3)

## 2014-10-12 LAB — CBC WITH DIFFERENTIAL/PLATELET
BASO%: 2.3 % — ABNORMAL HIGH (ref 0.0–2.0)
BASOS ABS: 0.1 10*3/uL (ref 0.0–0.1)
EOS ABS: 0.1 10*3/uL (ref 0.0–0.5)
EOS%: 3.7 % (ref 0.0–7.0)
HEMATOCRIT: 39.6 % (ref 34.8–46.6)
HEMOGLOBIN: 12.9 g/dL (ref 11.6–15.9)
LYMPH%: 24.4 % (ref 14.0–49.7)
MCH: 27.5 pg (ref 25.1–34.0)
MCHC: 32.7 g/dL (ref 31.5–36.0)
MCV: 84.2 fL (ref 79.5–101.0)
MONO#: 0.3 10*3/uL (ref 0.1–0.9)
MONO%: 9.6 % (ref 0.0–14.0)
NEUT#: 1.6 10*3/uL (ref 1.5–6.5)
NEUT%: 60 % (ref 38.4–76.8)
Platelets: 205 10*3/uL (ref 145–400)
RBC: 4.7 10*6/uL (ref 3.70–5.45)
RDW: 14.3 % (ref 11.2–14.5)
WBC: 2.7 10*3/uL — ABNORMAL LOW (ref 3.9–10.3)
lymph#: 0.7 10*3/uL — ABNORMAL LOW (ref 0.9–3.3)

## 2014-10-12 LAB — LACTATE DEHYDROGENASE (CC13): LDH: 191 U/L (ref 125–245)

## 2014-10-12 MED ORDER — IOHEXOL 300 MG/ML  SOLN
100.0000 mL | Freq: Once | INTRAMUSCULAR | Status: AC | PRN
  Administered 2014-10-12: 100 mL via INTRAVENOUS

## 2014-10-14 ENCOUNTER — Ambulatory Visit (HOSPITAL_BASED_OUTPATIENT_CLINIC_OR_DEPARTMENT_OTHER): Payer: Medicare Other | Admitting: Hematology and Oncology

## 2014-10-14 ENCOUNTER — Telehealth: Payer: Self-pay | Admitting: Hematology and Oncology

## 2014-10-14 ENCOUNTER — Ambulatory Visit (HOSPITAL_BASED_OUTPATIENT_CLINIC_OR_DEPARTMENT_OTHER): Payer: Medicare Other

## 2014-10-14 ENCOUNTER — Encounter: Payer: Self-pay | Admitting: Hematology and Oncology

## 2014-10-14 VITALS — BP 136/71 | HR 72 | Temp 97.8°F | Resp 18 | Ht 62.0 in | Wt 188.2 lb

## 2014-10-14 DIAGNOSIS — D72819 Decreased white blood cell count, unspecified: Secondary | ICD-10-CM | POA: Diagnosis not present

## 2014-10-14 DIAGNOSIS — C44729 Squamous cell carcinoma of skin of left lower limb, including hip: Secondary | ICD-10-CM | POA: Diagnosis not present

## 2014-10-14 DIAGNOSIS — C8239 Follicular lymphoma grade IIIa, extranodal and solid organ sites: Secondary | ICD-10-CM

## 2014-10-14 DIAGNOSIS — Z5112 Encounter for antineoplastic immunotherapy: Secondary | ICD-10-CM | POA: Diagnosis not present

## 2014-10-14 MED ORDER — ACETAMINOPHEN 325 MG PO TABS
650.0000 mg | ORAL_TABLET | Freq: Once | ORAL | Status: AC
  Administered 2014-10-14: 650 mg via ORAL

## 2014-10-14 MED ORDER — DIPHENHYDRAMINE HCL 25 MG PO CAPS
50.0000 mg | ORAL_CAPSULE | Freq: Once | ORAL | Status: AC
  Administered 2014-10-14: 50 mg via ORAL

## 2014-10-14 MED ORDER — SODIUM CHLORIDE 0.9 % IJ SOLN
10.0000 mL | INTRAMUSCULAR | Status: DC | PRN
  Administered 2014-10-14: 10 mL
  Filled 2014-10-14: qty 10

## 2014-10-14 MED ORDER — SODIUM CHLORIDE 0.9 % IV SOLN
Freq: Once | INTRAVENOUS | Status: AC
  Administered 2014-10-14: 10:00:00 via INTRAVENOUS

## 2014-10-14 MED ORDER — ACETAMINOPHEN 325 MG PO TABS
ORAL_TABLET | ORAL | Status: AC
  Filled 2014-10-14: qty 2

## 2014-10-14 MED ORDER — SODIUM CHLORIDE 0.9 % IV SOLN
375.0000 mg/m2 | Freq: Once | INTRAVENOUS | Status: AC
  Administered 2014-10-14: 700 mg via INTRAVENOUS
  Filled 2014-10-14: qty 70

## 2014-10-14 MED ORDER — DIPHENHYDRAMINE HCL 25 MG PO CAPS
ORAL_CAPSULE | ORAL | Status: AC
  Filled 2014-10-14: qty 2

## 2014-10-14 MED ORDER — HEPARIN SOD (PORK) LOCK FLUSH 100 UNIT/ML IV SOLN
500.0000 [IU] | Freq: Once | INTRAVENOUS | Status: AC | PRN
  Administered 2014-10-14: 500 [IU]
  Filled 2014-10-14: qty 5

## 2014-10-14 NOTE — Patient Instructions (Signed)
[facility] Discharge Instructions for Patients Receiving Chemotherapy  Today you received the following chemotherapy agents rituxan.   To help prevent nausea and vomiting after your treatment, we encourage you to take your nausea medication as directed.     If you develop nausea and vomiting that is not controlled by your nausea medication, call the clinic.   BELOW ARE SYMPTOMS THAT SHOULD BE REPORTED IMMEDIATELY:  *FEVER GREATER THAN 100.5 F  *CHILLS WITH OR WITHOUT FEVER  NAUSEA AND VOMITING THAT IS NOT CONTROLLED WITH YOUR NAUSEA MEDICATION  *UNUSUAL SHORTNESS OF BREATH  *UNUSUAL BRUISING OR BLEEDING  TENDERNESS IN MOUTH AND THROAT WITH OR WITHOUT PRESENCE OF ULCERS  *URINARY PROBLEMS  *BOWEL PROBLEMS  UNUSUAL RASH Items with * indicate a potential emergency and should be followed up as soon as possible.  Feel free to call the clinic you have any questions or concerns. The clinic phone number is [phone].  

## 2014-10-14 NOTE — Telephone Encounter (Signed)
gv adn printed appt sched and avs for pt for April...sed added tx... °

## 2014-10-15 NOTE — Progress Notes (Signed)
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 Needle biopsy was inconclusive.     08/02/2011 Imaging CT scan of the chest abdomen and pelvis showed numerous lymphadenopathy above and below the diaphragm.    08/16/2011 Imaging MRI of the abdomen revealed enlarging left kidney lesion worrisome for malignancy   09/20/2011 Initial Diagnosis Lymphoma, low grade from kidney biopsy, follicular   0/35/0093 Procedure Histologic type: Non-Hodgkin's lymphoma, follicular center cell type. Grade (if applicable): Favor high grade, Ki-67 ranges from 10-50%. Immunohistochemical stains: CD20, CD79a, CD10, BCL-2, BCL-6, CD21, CD3, CD43.   05/09/2013 Procedure BM biopsy is highly suspicious of involvement. Patient had persistent leukopenia   05/09/2013 Imaging PET/CT showed significant involvement of LN everywhere   07/16/2013 - 11/05/2013 Chemotherapy She completed 6 cycles of R. CHOP chemotherapy.   07/23/2013 - 07/27/2013 Hospital Admission Admitted for management of mucositis and neutropenic fever. Blood culture was positive for gram negative rods, resolved with antibiotics   10/08/2013 Adverse Reaction The dose of  vincristine is reduced by 50%   12/15/2013 Imaging Repeat PET CT scan show complete response to treatment.   12/17/2013 -  Chemotherapy She begin maintenance rituximab every other month   02/23/2014 Procedure She had shave biopsy of the left upper, that came back well-differentiated squamous cell carcinoma.   04/29/2014 Imaging CT scan of the abdomen showed no evidence of disease recurrence.   10/12/2014 Imaging Repeat CT scan of the chest, abdomen and pelvis showed no evidence of recurrence.    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    INTERVAL HISTORY: Please see below for problem oriented charting. She is seen prior to her rituximab treatment. She is doing well. She has multiple new skin lesions on the leg that is being observed closely by her dermatologist. There were no evidence of bleeding from those skin lesions.  REVIEW OF SYSTEMS:   Constitutional: Denies fevers, chills or abnormal weight loss Eyes: Denies blurriness of vision Ears, nose, mouth, throat, and face: Denies mucositis or sore throat Respiratory: Denies cough, dyspnea or wheezes Cardiovascular: Denies palpitation, chest discomfort or lower extremity swelling Gastrointestinal:  Denies nausea, heartburn or change in bowel habits Lymphatics: Denies new lymphadenopathy or easy bruising Neurological:Denies numbness, tingling or new weaknesses Behavioral/Psych: Mood is stable, no new changes  All other systems were reviewed with the patient and are negative.  I have reviewed the past medical history, past surgical history, social history  and family history with the patient and they are unchanged from previous note.  ALLERGIES:  is  allergic to latex.  MEDICATIONS:  Current Outpatient Prescriptions  Medication Sig Dispense Refill  . acetaminophen (TYLENOL) 500 MG tablet Take 250 mg by mouth every 6 (six) hours as needed for moderate pain.    Marland Kitchen aspirin 81 MG tablet Take 81 mg by mouth at bedtime.     . B Complex Vitamins (B-COMPLEX/B-12 PO) Take by mouth.    Marland Kitchen CALCIUM-MAGNESIUM-ZINC PO Take 1 tablet by mouth daily.     . Cholecalciferol (VITAMIN D) 2000 UNITS tablet Take 2,000 Units by mouth daily.    . clobetasol cream (TEMOVATE) 5.49 % Apply 1 application topically daily. Apply to affected area    . docusate sodium (COLACE) 100 MG capsule Take 1 capsule (100 mg total) by mouth 2 (two) times daily. (Patient taking differently: Take 100 mg by mouth daily as needed for mild constipation. ) 100 capsule 1  . lidocaine-prilocaine (EMLA) cream Apply topically as needed. 30 g 0  . loratadine (CLARITIN) 10 MG tablet Take 10 mg by mouth daily.    Marland Kitchen losartan-hydrochlorothiazide (HYZAAR) 100-12.5 MG per tablet     . Omega-3 Fatty Acids (FISH OIL) 1000 MG CAPS Take by mouth 2 (two) times daily.    . ondansetron (ZOFRAN) 8 MG tablet Take 8 mg by mouth every 8 (eight) hours as needed for nausea or vomiting.    . pantoprazole (PROTONIX) 40 MG tablet     . potassium chloride SA (K-DUR,KLOR-CON) 20 MEQ tablet Take 20 mEq by mouth daily.     . Probiotic Product (PROBIOTIC DAILY PO) Take 1 capsule by mouth daily.    Marland Kitchen pyridOXINE (VITAMIN B-6) 100 MG tablet Take 100 mg by mouth daily.     . Red Yeast Rice 600 MG CAPS Take 1 capsule by mouth every evening.      No current facility-administered medications for this visit.    PHYSICAL EXAMINATION: ECOG PERFORMANCE STATUS: 0 - Asymptomatic  Filed Vitals:   10/14/14 0919  BP: 136/71  Pulse: 72  Temp: 97.8 F (36.6 C)  Resp: 18   Filed Weights   10/14/14 0919  Weight: 188 lb 3.2 oz (85.367 kg)    GENERAL:alert, no distress and comfortable SKIN: She has multiple skin lesions  resemble possible squamous cell carcinoma EYES: normal, Conjunctiva are pink and non-injected, sclera clear OROPHARYNX:no exudate, no erythema and lips, buccal mucosa, and tongue normal  NECK: supple, thyroid normal size, non-tender, without nodularity LYMPH:  no palpable lymphadenopathy in the cervical, axillary or inguinal LUNGS: clear to auscultation and percussion with normal breathing effort HEART: regular rate & rhythm and no murmurs and no lower extremity edema ABDOMEN:abdomen soft, non-tender and normal bowel sounds Musculoskeletal:no cyanosis of digits and no clubbing  NEURO: alert & oriented x 3 with fluent speech, no focal motor/sensory deficits  LABORATORY DATA:  I have reviewed the data as listed    Component Value Date/Time   NA 143 10/12/2014 1008   NA 139 09/10/2014 1012   K 4.0 10/12/2014 1008   K 3.5 09/10/2014 1012   CL 104 09/10/2014 1012   CO2 27 10/12/2014 1008   CO2 27 09/10/2014 1012   GLUCOSE 91 10/12/2014 1008   GLUCOSE 97 09/10/2014 1012   BUN 18.0 10/12/2014 1008   BUN 12 09/10/2014 1012   CREATININE 1.1 10/12/2014 1008   CREATININE 0.88 09/10/2014 1012   CALCIUM 9.2 10/12/2014 1008   CALCIUM  9.1 09/10/2014 1012   PROT 6.9 10/12/2014 1008   PROT 6.8 09/10/2014 1012   ALBUMIN 4.3 10/12/2014 1008   ALBUMIN 4.5 09/10/2014 1012   AST 24 10/12/2014 1008   AST 31 09/10/2014 1012   ALT 22 10/12/2014 1008   ALT 29 09/10/2014 1012   ALKPHOS 82 10/12/2014 1008   ALKPHOS 81 09/10/2014 1012   BILITOT 0.66 10/12/2014 1008   BILITOT 0.9 09/10/2014 1012   GFRNONAA 66* 09/10/2014 1012   GFRAA 76* 09/10/2014 1012    No results found for: SPEP, UPEP  Lab Results  Component Value Date   WBC 2.7* 10/12/2014   NEUTROABS 1.6 10/12/2014   HGB 12.9 10/12/2014   HCT 39.6 10/12/2014   MCV 84.2 10/12/2014   PLT 205 10/12/2014      Chemistry      Component Value Date/Time   NA 143 10/12/2014 1008   NA 139 09/10/2014 1012   K 4.0 10/12/2014 1008   K 3.5  09/10/2014 1012   CL 104 09/10/2014 1012   CO2 27 10/12/2014 1008   CO2 27 09/10/2014 1012   BUN 18.0 10/12/2014 1008   BUN 12 09/10/2014 1012   CREATININE 1.1 10/12/2014 1008   CREATININE 0.88 09/10/2014 1012      Component Value Date/Time   CALCIUM 9.2 10/12/2014 1008   CALCIUM 9.1 09/10/2014 1012   ALKPHOS 82 10/12/2014 1008   ALKPHOS 81 09/10/2014 1012   AST 24 10/12/2014 1008   AST 31 09/10/2014 1012   ALT 22 10/12/2014 1008   ALT 29 09/10/2014 1012   BILITOT 0.66 10/12/2014 1008   BILITOT 0.9 09/10/2014 1012       RADIOGRAPHIC STUDIES: I have reviewed the imaging study with the patient I have personally reviewed the radiological images as listed and agreed with the findings in the report.   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 Repeat imaging showed no evidence of recurrence of disease. She will continue on current treatment for 2 years.   Leukopenia This is related to side effects of treatment. She is not symptomatic. Observed only.   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 I recommend the patient to take pictures of her leg so that this could be followed closely.    Orders Placed This Encounter  Procedures  . CBC with Differential/Platelet    Standing Status: Standing     Number of Occurrences: 9     Standing Expiration Date: 10/15/2015  . Comprehensive metabolic panel    Standing Status: Standing     Number of Occurrences: 9     Standing Expiration Date: 10/15/2015   All questions were answered. The patient knows to call the clinic with any problems, questions or concerns. No barriers to learning was detected. I spent 25 minutes counseling the patient face to face. The total time spent in the appointment was 30 minutes and more than 50% was on counseling and review of test results     [facility], Braylyn Kalter, MD 10/15/2014 12:07  PM

## 2014-10-15 NOTE — Assessment & Plan Note (Signed)
She will continue close follow-up with the dermatologist. I recommend the patient to take pictures of her leg so that this could be followed closely.

## 2014-10-15 NOTE — Assessment & Plan Note (Signed)
This is related to side effects of treatment. She is not symptomatic. Observed only.

## 2014-10-15 NOTE — Assessment & Plan Note (Signed)
She tolerated treatment well. Repeat imaging showed no evidence of recurrence of disease. She will continue on current treatment for 2 years.

## 2014-10-28 ENCOUNTER — Ambulatory Visit (INDEPENDENT_AMBULATORY_CARE_PROVIDER_SITE_OTHER): Payer: Medicare Other | Admitting: Internal Medicine

## 2014-10-28 ENCOUNTER — Encounter (INDEPENDENT_AMBULATORY_CARE_PROVIDER_SITE_OTHER): Payer: Self-pay | Admitting: Internal Medicine

## 2014-10-28 VITALS — BP 136/78 | HR 66 | Temp 97.9°F | Ht 62.0 in | Wt 187.8 lb

## 2014-10-28 DIAGNOSIS — K219 Gastro-esophageal reflux disease without esophagitis: Secondary | ICD-10-CM

## 2014-10-28 NOTE — Patient Instructions (Signed)
OV in 4 weeks. May consider EGD at that time.

## 2014-10-28 NOTE — Progress Notes (Addendum)
Subjective:    Patient ID: [name], female    DOB: 02-15-1946, 69 y.o.   MRN: [id number]  HPI Referred to our office by Dr. Berdine Addison for acid reflux. She tells me she was switched from Prevacid to Protonix around January. Her acid reflux is much better since switching to Protonix. She rarely has acid reflux now since starting the Protonix.  She did eat some greasy chicken last night from [facility] and had some acid reflux. Diagnosed with Lymphoma in 2012, stage 4A. She does maintenance therapy every 8 weeks with Rituxan.   Her appetite is good. No weight loss. BMs are normal. No melena or BRRB. Seen in ED in December for chest pain and cardiac work up was negative. Pain was relieved with a GI cocktail. Echo 07/30/2014 EF 60%. Colonoscopy 02/11/2009: Dr. Laural Golden: Screening Colonoscopy: Hx of small polyp which was nonadenoma. Exam to ceeum. Pancolonic diverticulosis. Most of the diverticula however are located at the sigmoid colon.    Review of Systems Past Medical History  Diagnosis Date  . CAD (coronary artery disease)   . GERD (gastroesophageal reflux disease)   . HTN (hypertension)   . Hyperlipidemia   . Enlargement of lymph nodes 07/26/2011  . 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 Lymphoma   . Tick bite of knee   . Thyroid disease   . Follicular lymphoma 36/62/9476  . 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  B-cell, favor follicular NHL.  CD79a, CD20 and CD10 positive.  Cyclin D1 negative.   . Mouth sores 07/18/2013  . Fever 07/23/2013  . Fungal infection 08/01/2013  . Hemorrhoids 08/05/2013  . Leukopenia 09/16/2013  . 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 Skull deformity 10/08/2013  . Broken tooth 10/17/2013  . Infection of lip 10/27/2013  . UTI (urinary tract infection) 11/10/2013    Past Surgical History  Procedure Laterality Date  . Mr breast  bilateral  cyst removal  . Abdominal hysterectomy    . Lymph node dissection  x 3 different times    behind left ear 2012/ left clavical area  . Tubal ligation    . Knee arthroscopy w/ debridement  05/2011    right  . Bone marrow biopsy  05/09/2013  . Bone marrow aspiration  05/09/2013    Allergies  Allergen Reactions  . Latex Swelling and Rash    Current Outpatient Prescriptions on File Prior to Visit  Medication Sig Dispense Refill  . acetaminophen (TYLENOL) 500 MG tablet Take 250 mg by mouth every 6 (six) hours as needed for moderate pain.    Marland Kitchen aspirin 81 MG tablet Take 81 mg by mouth at bedtime.     . B Complex Vitamins (B-COMPLEX/B-12 PO) Take by mouth.    Marland Kitchen CALCIUM-MAGNESIUM-ZINC PO Take 1 tablet by mouth daily.     . Cholecalciferol (VITAMIN D) 2000 UNITS tablet Take 2,000 Units by mouth daily.    Marland Kitchen docusate sodium (COLACE) 100 MG capsule Take 1 capsule (100 mg total) by mouth 2 (two) times daily. (Patient taking differently: Take 100 mg by mouth daily as needed for mild constipation. ) 100 capsule 1  . loratadine (CLARITIN) 10 MG tablet Take 10 mg by mouth daily.    Marland Kitchen losartan-hydrochlorothiazide (HYZAAR) 100-12.5 MG per tablet     . Omega-3 Fatty Acids (FISH OIL) 1000 MG CAPS Take by mouth  2 (two) times daily.    . ondansetron (ZOFRAN) 8 MG tablet Take 8 mg by mouth every 8 (eight) hours as needed for nausea or vomiting.    . pantoprazole (PROTONIX) 40 MG tablet     . potassium chloride SA (K-DUR,KLOR-CON) 20 MEQ tablet Take 20 mEq by mouth daily.     . Probiotic Product (PROBIOTIC DAILY PO) Take 1 capsule by mouth daily.    Marland Kitchen pyridOXINE (VITAMIN B-6) 100 MG tablet Take 100 mg by mouth daily.     . Red Yeast Rice 600 MG CAPS Take 1 capsule by mouth every evening.      No current facility-administered medications on file prior to visit.   Married. Two children in good health.  Retired from [employer]     Objective:   Physical Exam  Filed Vitals:   10/28/14 0954    Height: _0  (1.575 m)  Weight: 187 lb 12.8 oz (85.186 kg)   Alert and oriented. Skin warm and dry. Oral mucosa is moist.   . Sclera anicteric, conjunctivae is pink. Thyroid not enlarged. No cervical lymphadenopathy. Lungs clear. Heart regular rate and rhythm.  Abdomen is soft. Bowel sounds are positive. No hepatomegaly. No abdominal masses felt. No tenderness.  No edema to lower extremities.          Assessment & Plan:  GERD controlled at this time with Protonix now. Will see back in office in 4 weeks. Patient will decide if she wants to proceed with an EGD. Continue the Protonix Will try to locate last colonoscopy report from [place].

## 2014-10-29 DIAGNOSIS — M1711 Unilateral primary osteoarthritis, right knee: Secondary | ICD-10-CM | POA: Diagnosis not present

## 2014-10-30 DIAGNOSIS — L609 Nail disorder, unspecified: Secondary | ICD-10-CM | POA: Diagnosis not present

## 2014-10-30 DIAGNOSIS — L748 Other eccrine sweat disorders: Secondary | ICD-10-CM | POA: Diagnosis not present

## 2014-11-19 DIAGNOSIS — Z85828 Personal history of other malignant neoplasm of skin: Secondary | ICD-10-CM | POA: Diagnosis not present

## 2014-11-19 DIAGNOSIS — L820 Inflamed seborrheic keratosis: Secondary | ICD-10-CM | POA: Diagnosis not present

## 2014-11-19 DIAGNOSIS — Z08 Encounter for follow-up examination after completed treatment for malignant neoplasm: Secondary | ICD-10-CM | POA: Diagnosis not present

## 2014-11-20 ENCOUNTER — Ambulatory Visit (INDEPENDENT_AMBULATORY_CARE_PROVIDER_SITE_OTHER): Payer: Medicare Other | Admitting: Obstetrics & Gynecology

## 2014-11-20 ENCOUNTER — Encounter: Payer: Self-pay | Admitting: Obstetrics & Gynecology

## 2014-11-20 VITALS — BP 132/76 | HR 60 | Ht 62.0 in | Wt 189.5 lb

## 2014-11-20 DIAGNOSIS — J0100 Acute maxillary sinusitis, unspecified: Secondary | ICD-10-CM | POA: Diagnosis not present

## 2014-11-20 DIAGNOSIS — M75101 Unspecified rotator cuff tear or rupture of right shoulder, not specified as traumatic: Secondary | ICD-10-CM | POA: Diagnosis not present

## 2014-11-20 DIAGNOSIS — M75102 Unspecified rotator cuff tear or rupture of left shoulder, not specified as traumatic: Secondary | ICD-10-CM

## 2014-11-20 DIAGNOSIS — M940 Chondrocostal junction syndrome [Tietze]: Secondary | ICD-10-CM

## 2014-11-20 DIAGNOSIS — M67912 Unspecified disorder of synovium and tendon, left shoulder: Secondary | ICD-10-CM

## 2014-11-20 DIAGNOSIS — M67911 Unspecified disorder of synovium and tendon, right shoulder: Secondary | ICD-10-CM

## 2014-11-20 MED ORDER — AMOXICILLIN-POT CLAVULANATE 875-125 MG PO TABS: 1.0000 | ORAL_TABLET | Freq: Two times a day (BID) | ORAL | Status: DC

## 2014-11-20 NOTE — Progress Notes (Signed)
Patient ID: [name], female   DOB: July 18, 1946, 69 y.o.   MRN: [id number]   Chief Complaint  Patient presents with  . shooting pain in left breast    c/o possible sinus infection     HPI:    #1:  Pain in left breast  Location:  Left breast. Quality:  Sharp shooting. Severity:  moderate. Timing:  episodic. Duration:  minutes. Context:  More with movement. Modifying factors:  none Signs/Symptoms:    #2:  Complaints of sinus pressure and pain for a couple of days, stuffy, cough, has history of sinus problems in past  #3:  Pain with moving of both shoulders, present for some time, pain and limitation of movement, especially in moving hand to back of head, radiates down arms, struggles to put arms above head to get clothes etc   Current outpatient prescriptions:  .  acetaminophen (TYLENOL) 500 MG tablet, Take 250 mg by mouth every 6 (six) hours as needed for moderate pain., Disp: , Rfl:  .  aspirin 81 MG tablet, Take 81 mg by mouth at bedtime. , Disp: , Rfl:  .  CALCIUM-MAGNESIUM-ZINC PO, Take 1 tablet by mouth daily. , Disp: , Rfl:  .  Cholecalciferol (VITAMIN D) 2000 UNITS tablet, Take 2,000 Units by mouth daily., Disp: , Rfl:  .  Cyanocobalamin (VITAMIN B 12 PO), Take 1 tablet by mouth daily., Disp: , Rfl:  .  docusate sodium (COLACE) 100 MG capsule, Take 1 capsule (100 mg total) by mouth 2 (two) times daily. (Patient taking differently: Take 100 mg by mouth daily as needed for mild constipation. ), Disp: 100 capsule, Rfl: 1 .  loratadine (CLARITIN) 10 MG tablet, Take 10 mg by mouth daily., Disp: , Rfl:  .  losartan-hydrochlorothiazide (HYZAAR) 100-12.5 MG per tablet, , Disp: , Rfl:  .  Omega-3 Fatty Acids (FISH OIL) 1000 MG CAPS, Take by mouth 2 (two) times daily., Disp: , Rfl:  .  ondansetron (ZOFRAN) 8 MG tablet, Take 8 mg by mouth every 8 (eight) hours as needed for nausea or vomiting., Disp: , Rfl:  .  pantoprazole (PROTONIX) 40 MG tablet, Take 40 mg by mouth daily. ,  Disp: , Rfl:  .  potassium chloride SA (K-DUR,KLOR-CON) 20 MEQ tablet, Take 20 mEq by mouth daily. , Disp: , Rfl:  .  Probiotic Product (PROBIOTIC DAILY PO), Take 1 capsule by mouth daily., Disp: , Rfl:  .  pyridOXINE (VITAMIN B-6) 100 MG tablet, Take 100 mg by mouth daily. , Disp: , Rfl:  .  Red Yeast Rice 600 MG CAPS, Take 1 capsule by mouth every evening. , Disp: , Rfl:  .  B Complex Vitamins (B-COMPLEX/B-12 PO), Take by mouth., Disp: , Rfl:    Allergies  Allergen Reactions  . Latex Swelling and Rash    Problem Pertinent ROS:       No headaches,, no ear pain No breast discharge, no skin changes of breast No SOB, no chest pain, no fatigue or excessive heat intolerance No fever chills   Extended ROS:        PMFSH:             Past Medical History  Diagnosis Date  . CAD (coronary artery disease)   . GERD (gastroesophageal reflux disease)   . HTN (hypertension)   . Hyperlipidemia   . Enlargement of lymph nodes 07/26/2011  . 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 Lymphoma   . Tick bite  of knee   . Thyroid disease   . Follicular lymphoma 48/18/5631  . 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  B-cell, favor follicular NHL.  CD79a, CD20 and CD10 positive.  Cyclin D1 negative.   . Mouth sores 07/18/2013  . Fever 07/23/2013  . Fungal infection 08/01/2013  . Hemorrhoids 08/05/2013  . Leukopenia 09/16/2013  . 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 Skull deformity 10/08/2013  . Broken tooth 10/17/2013  . Infection of lip 10/27/2013  . UTI (urinary tract infection) 11/10/2013    Past Surgical History  Procedure Laterality Date  . Mr breast bilateral  cyst removal  . Abdominal hysterectomy    . Lymph node dissection  x 3 different times    behind left ear 2012/ left clavical area  . Tubal ligation    . Knee arthroscopy w/ debridement  05/2011    right   . Bone marrow biopsy  05/09/2013  . Bone marrow aspiration  05/09/2013    OB History    No data available      Allergies  Allergen Reactions  . Latex Swelling and Rash    History   Social History  . Marital Status: Married    Spouse Name: N/A  . Number of Children: N/A  . Years of Education: N/A   Occupational History  . [employer]    Social History Main Topics  . Smoking status: Never Smoker   . Smokeless tobacco: Never Used  . Alcohol Use: No  . Drug Use: No  . Sexual Activity: No   Other Topics Concern  . None   Social History Narrative    Family History  Problem Relation Age of Onset  . Cancer Mother     breast  . Stroke Mother   . Cancer Father     pancreatic cancer  . Heart disease Father   . Cancer Brother     kidney cancer/brain tumor  . Heart disease Brother   . Coronary artery disease Brother      Examination:  Vitals:  Blood pressure 132/76, pulse 60, height 5' 2"  (1.575 m), weight 189 lb 8 oz (85.957 kg).    Physical Examination:     Gen WDWN female NAD General Appearance:  well developed, well nourished, in no acute distress, alert and cooperative Head/face:  Tender-  bilateral-  frontal, maxillary and periorbital Nose/Sinuses:  positive findings: tender sinuses asabove Lungs:  Normal expansion.  Clear to auscultation.  No rales, rhonchi, or wheezing.,  Chest wall: tender to palpation of the sternum and rib articulation Heart:  Heart sounds are normal.  Regular rate and rhythm without murmur, gallop or rub. Breast:  nontender breasts themselves, when isolated from chest wall, no masses palpble Joint:  R shoulder:  Diminished ROM pain with moving hand behind head, incorporates auxillary muscles L shoulder:  Diminished ROM pain with moving hand behind head, incorporates auxillary muscles      DATA orders and reviews: Labs were not ordered today:   Imaging studies were not ordered today:    Lab tests were not reviewed today:     Imaging studies were reviewed today: Mammogram from June 2015    I did not independently review/view images, tracing or specimen(not simply the report) myself.  Prescription Drug Management:  New Prescriptions: Augmentin 875 mg BID, Naproxen sodium prn for costochondritis and shoulders prn Renewed Prescriptions:  none Current prescription changes:  none   Impression/Plan(Problem Based): 1.  costochondritis      (new problem) : Additional workup is not needed:  OTC Naproxen sodium prn, not breast mammogram was normal and recommend repeat when due in June  {2.  Sinusitis      (new problem:) : Additional workup is not needed:  Augmentin 875 BID x 10 days  3.   Rotator cuff pain,       (new problem: ) : Additional workup is not needed:  OTC Naproxen sodium prn   Follow Up:   prn  Or yearly exam

## 2014-11-23 DIAGNOSIS — M940 Chondrocostal junction syndrome [Tietze]: Secondary | ICD-10-CM | POA: Diagnosis not present

## 2014-11-23 DIAGNOSIS — I10 Essential (primary) hypertension: Secondary | ICD-10-CM | POA: Diagnosis not present

## 2014-11-26 ENCOUNTER — Ambulatory Visit (INDEPENDENT_AMBULATORY_CARE_PROVIDER_SITE_OTHER): Payer: PRIVATE HEALTH INSURANCE | Admitting: Internal Medicine

## 2014-11-30 ENCOUNTER — Ambulatory Visit (INDEPENDENT_AMBULATORY_CARE_PROVIDER_SITE_OTHER): Payer: Medicare Other | Admitting: Internal Medicine

## 2014-11-30 ENCOUNTER — Encounter (INDEPENDENT_AMBULATORY_CARE_PROVIDER_SITE_OTHER): Payer: Self-pay | Admitting: Internal Medicine

## 2014-11-30 VITALS — BP 134/60 | HR 72 | Temp 98.6°F | Ht 62.0 in | Wt 192.7 lb

## 2014-11-30 DIAGNOSIS — K219 Gastro-esophageal reflux disease without esophagitis: Secondary | ICD-10-CM

## 2014-11-30 NOTE — Patient Instructions (Signed)
OV 1 yr 

## 2014-11-30 NOTE — Progress Notes (Signed)
Subjective:    Patient ID: [name], female    DOB: 07/09/1946, 69 y.o.   MRN: [id number]  HPI Here today for f/u. Last seen about 4 weeks a go. Referred by Dr. Berdine Addison for acid reflux. Presently taking Protonix for her acid reflux. Declined EGD last visit. She tells me she is doing good. She says she has some cold sores in her mouth. Just finished taking Amoxicillin for a sinus infections. She tells me she is not having any acid reflux. She is fine.  Diagnosed with Lymphoma in 2012. She does maintenance therapy every 8 weeks with Rituxan.  Her appetite is good. No weight loss. BMs are normal. No melena or BRRB. Seen in ED in December for chest pain and cardiac work up was negative. Pain was relieved with a GI cocktail. Echo 07/30/2014 EF 60%. Colonoscopy 02/11/2009: Dr. Laural Golden: Screening Colonoscopy: Hx of small polyp which was nonadenoma. Exam to cecum. Pancolonic diverticulosis. Most of the diverticula however are located at the sigmoid colon.    Review of Systems  Past Medical History  Diagnosis Date  . CAD (coronary artery disease)   . GERD (gastroesophageal reflux disease)   . HTN (hypertension)   . Hyperlipidemia   . Enlargement of lymph nodes 07/26/2011  . 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 Lymphoma   . Tick bite of knee   . Thyroid disease   . Follicular lymphoma 03/70/4888  . 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  B-cell, favor follicular NHL.  CD79a, CD20 and CD10 positive.  Cyclin D1 negative.   . Mouth sores 07/18/2013  . Fever 07/23/2013  . Fungal infection 08/01/2013  . Hemorrhoids 08/05/2013  . Leukopenia 09/16/2013  . 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 Skull deformity 10/08/2013  . Broken tooth 10/17/2013  . Infection of lip 10/27/2013  . UTI (urinary tract infection) 11/10/2013    Past Surgical History  Procedure Laterality  Date  . Mr breast bilateral  cyst removal  . Abdominal hysterectomy    . Lymph node dissection  x 3 different times    behind left ear 2012/ left clavical area  . Tubal ligation    . Knee arthroscopy w/ debridement  05/2011    right  . Bone marrow biopsy  05/09/2013  . Bone marrow aspiration  05/09/2013    Allergies  Allergen Reactions  . Latex Swelling and Rash    Current Outpatient Prescriptions on File Prior to Visit  Medication Sig Dispense Refill  . acetaminophen (TYLENOL) 500 MG tablet Take 250 mg by mouth every 6 (six) hours as needed for moderate pain.    Marland Kitchen aspirin 81 MG tablet Take 81 mg by mouth at bedtime.     . B Complex Vitamins (B-COMPLEX/B-12 PO) Take by mouth.    Marland Kitchen CALCIUM-MAGNESIUM-ZINC PO Take 1 tablet by mouth daily.     . Cholecalciferol (VITAMIN D) 2000 UNITS tablet Take 2,000 Units by mouth daily.    . Cyanocobalamin (VITAMIN B 12 PO) Take 1 tablet by mouth daily.    Marland Kitchen docusate sodium (COLACE) 100 MG capsule Take 1 capsule (100 mg total) by mouth 2 (two) times daily. (Patient taking differently: Take 100 mg by mouth daily as needed for mild constipation. ) 100 capsule 1  . loratadine (CLARITIN) 10 MG tablet Take 10 mg by mouth daily.    Marland Kitchen  losartan-hydrochlorothiazide (HYZAAR) 100-12.5 MG per tablet     . Omega-3 Fatty Acids (FISH OIL) 1000 MG CAPS Take by mouth 2 (two) times daily.    . ondansetron (ZOFRAN) 8 MG tablet Take 8 mg by mouth every 8 (eight) hours as needed for nausea or vomiting.    . pantoprazole (PROTONIX) 40 MG tablet Take 40 mg by mouth daily.     . potassium chloride SA (K-DUR,KLOR-CON) 20 MEQ tablet Take 20 mEq by mouth daily.     . Probiotic Product (PROBIOTIC DAILY PO) Take 1 capsule by mouth daily.    Marland Kitchen pyridOXINE (VITAMIN B-6) 100 MG tablet Take 100 mg by mouth daily.     . Red Yeast Rice 600 MG CAPS Take 1 capsule by mouth every evening.      No current facility-administered medications on file prior to visit.         Objective:    Physical Exam Blood pressure 134/60, pulse 72, temperature 98.6 F (37 C), height _0  (1.575 m), weight 192 lb 11.2 oz (87.408 kg). Alert and oriented. Skin warm and dry. Oral mucosa is moist.   . Sclera anicteric, conjunctivae is pink. Thyroid not enlarged. No cervical lymphadenopathy. Lungs clear. Heart regular rate and rhythm.  Abdomen is soft. Bowel sounds are positive. No hepatomegaly. No abdominal masses felt. No tenderness.  No edema to lower extremities.          Assessment & Plan:  GERD controlled at this time with Protonix. Does not want to proceed with an EGD OV in 1 yr.

## 2014-12-16 ENCOUNTER — Encounter: Payer: Self-pay | Admitting: Hematology and Oncology

## 2014-12-16 ENCOUNTER — Ambulatory Visit (HOSPITAL_BASED_OUTPATIENT_CLINIC_OR_DEPARTMENT_OTHER): Payer: Medicare Other | Admitting: Hematology and Oncology

## 2014-12-16 ENCOUNTER — Ambulatory Visit: Payer: Medicare Other

## 2014-12-16 ENCOUNTER — Ambulatory Visit (HOSPITAL_BASED_OUTPATIENT_CLINIC_OR_DEPARTMENT_OTHER): Payer: Medicare Other

## 2014-12-16 ENCOUNTER — Other Ambulatory Visit (HOSPITAL_BASED_OUTPATIENT_CLINIC_OR_DEPARTMENT_OTHER): Payer: Medicare Other

## 2014-12-16 ENCOUNTER — Telehealth: Payer: Self-pay | Admitting: Hematology and Oncology

## 2014-12-16 VITALS — BP 153/78 | HR 78 | Temp 98.0°F | Resp 18 | Ht 62.0 in | Wt 190.1 lb

## 2014-12-16 DIAGNOSIS — D72819 Decreased white blood cell count, unspecified: Secondary | ICD-10-CM

## 2014-12-16 DIAGNOSIS — C8239 Follicular lymphoma grade IIIa, extranodal and solid organ sites: Secondary | ICD-10-CM

## 2014-12-16 DIAGNOSIS — R609 Edema, unspecified: Secondary | ICD-10-CM

## 2014-12-16 DIAGNOSIS — I10 Essential (primary) hypertension: Secondary | ICD-10-CM

## 2014-12-16 DIAGNOSIS — Z95828 Presence of other vascular implants and grafts: Secondary | ICD-10-CM

## 2014-12-16 DIAGNOSIS — C8293 Follicular lymphoma, unspecified, intra-abdominal lymph nodes: Secondary | ICD-10-CM

## 2014-12-16 DIAGNOSIS — R600 Localized edema: Secondary | ICD-10-CM

## 2014-12-16 DIAGNOSIS — M25569 Pain in unspecified knee: Secondary | ICD-10-CM

## 2014-12-16 DIAGNOSIS — Z5112 Encounter for antineoplastic immunotherapy: Secondary | ICD-10-CM

## 2014-12-16 DIAGNOSIS — C44729 Squamous cell carcinoma of skin of left lower limb, including hip: Secondary | ICD-10-CM | POA: Diagnosis not present

## 2014-12-16 LAB — CBC WITH DIFFERENTIAL/PLATELET
BASO%: 1.3 % (ref 0.0–2.0)
Basophils Absolute: 0 10*3/uL (ref 0.0–0.1)
EOS%: 3.7 % (ref 0.0–7.0)
Eosinophils Absolute: 0.1 10*3/uL (ref 0.0–0.5)
HCT: 40 % (ref 34.8–46.6)
HEMOGLOBIN: 13.3 g/dL (ref 11.6–15.9)
LYMPH#: 0.7 10*3/uL — AB (ref 0.9–3.3)
LYMPH%: 22 % (ref 14.0–49.7)
MCH: 27.5 pg (ref 25.1–34.0)
MCHC: 33.2 g/dL (ref 31.5–36.0)
MCV: 83 fL (ref 79.5–101.0)
MONO#: 0.4 10*3/uL (ref 0.1–0.9)
MONO%: 10.5 % (ref 0.0–14.0)
NEUT#: 2.1 10*3/uL (ref 1.5–6.5)
NEUT%: 62.5 % (ref 38.4–76.8)
Platelets: 200 10*3/uL (ref 145–400)
RBC: 4.81 10*6/uL (ref 3.70–5.45)
RDW: 15 % — ABNORMAL HIGH (ref 11.2–14.5)
WBC: 3.3 10*3/uL — AB (ref 3.9–10.3)

## 2014-12-16 LAB — COMPREHENSIVE METABOLIC PANEL (CC13)
ALT: 34 U/L (ref 0–55)
AST: 29 U/L (ref 5–34)
Albumin: 4.3 g/dL (ref 3.5–5.0)
Alkaline Phosphatase: 91 U/L (ref 40–150)
Anion Gap: 10 mEq/L (ref 3–11)
BUN: 16 mg/dL (ref 7.0–26.0)
CALCIUM: 9.3 mg/dL (ref 8.4–10.4)
CO2: 24 mEq/L (ref 22–29)
Chloride: 106 mEq/L (ref 98–109)
Creatinine: 1 mg/dL (ref 0.6–1.1)
EGFR: 68 mL/min/{1.73_m2} — ABNORMAL LOW (ref >90)
Glucose: 93 mg/dl (ref 70–140)
Potassium: 4.4 mEq/L (ref 3.5–5.1)
SODIUM: 140 meq/L (ref 136–145)
TOTAL PROTEIN: 7 g/dL (ref 6.4–8.3)
Total Bilirubin: 0.63 mg/dL (ref 0.20–1.20)

## 2014-12-16 MED ORDER — SODIUM CHLORIDE 0.9 % IJ SOLN
10.0000 mL | INTRAMUSCULAR | Status: DC | PRN
  Administered 2014-12-16: 10 mL
  Filled 2014-12-16: qty 10

## 2014-12-16 MED ORDER — RITUXIMAB CHEMO INJECTION 500 MG/50ML
375.0000 mg/m2 | Freq: Once | INTRAVENOUS | Status: AC
  Administered 2014-12-16: 700 mg via INTRAVENOUS
  Filled 2014-12-16: qty 70

## 2014-12-16 MED ORDER — SODIUM CHLORIDE 0.9 % IV SOLN
Freq: Once | INTRAVENOUS | Status: AC
  Administered 2014-12-16: 10:00:00 via INTRAVENOUS

## 2014-12-16 MED ORDER — HEPARIN SOD (PORK) LOCK FLUSH 100 UNIT/ML IV SOLN
500.0000 [IU] | Freq: Once | INTRAVENOUS | Status: AC | PRN
  Administered 2014-12-16: 500 [IU]
  Filled 2014-12-16: qty 5

## 2014-12-16 MED ORDER — SODIUM CHLORIDE 0.9 % IJ SOLN
10.0000 mL | INTRAMUSCULAR | Status: DC | PRN
  Administered 2014-12-16: 10 mL via INTRAVENOUS
  Filled 2014-12-16: qty 10

## 2014-12-16 MED ORDER — ACETAMINOPHEN 325 MG PO TABS
ORAL_TABLET | ORAL | Status: AC
  Filled 2014-12-16: qty 2

## 2014-12-16 MED ORDER — DIPHENHYDRAMINE HCL 25 MG PO CAPS
ORAL_CAPSULE | ORAL | Status: AC
  Filled 2014-12-16: qty 2

## 2014-12-16 MED ORDER — DIPHENHYDRAMINE HCL 25 MG PO CAPS
50.0000 mg | ORAL_CAPSULE | Freq: Once | ORAL | Status: AC
  Administered 2014-12-16: 50 mg via ORAL

## 2014-12-16 MED ORDER — ACETAMINOPHEN 325 MG PO TABS
650.0000 mg | ORAL_TABLET | Freq: Once | ORAL | Status: AC
  Administered 2014-12-16: 650 mg via ORAL

## 2014-12-16 NOTE — Progress Notes (Signed)
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 Needle biopsy was inconclusive.     08/02/2011 Imaging CT scan of the chest abdomen and pelvis showed numerous lymphadenopathy above and below the diaphragm.    08/16/2011 Imaging MRI of the abdomen revealed enlarging left kidney lesion worrisome for malignancy   09/20/2011 Initial Diagnosis Lymphoma, low grade from kidney biopsy, follicular   03/26/9677 Procedure Histologic type: Non-Hodgkin's lymphoma, follicular center cell type. Grade (if applicable): Favor high grade, Ki-67 ranges from 10-50%. Immunohistochemical stains: CD20, CD79a, CD10, BCL-2, BCL-6, CD21, CD3, CD43.   05/09/2013 Procedure BM biopsy is highly suspicious of involvement. Patient had persistent leukopenia   05/09/2013 Imaging PET/CT showed significant involvement of LN everywhere   07/16/2013 - 11/05/2013 Chemotherapy She completed 6 cycles of R. CHOP chemotherapy.   07/23/2013 - 07/27/2013 Hospital Admission Admitted for management of mucositis and neutropenic fever. Blood culture was positive for gram negative rods, resolved with antibiotics   10/08/2013 Adverse Reaction The dose of  vincristine is reduced by 50%   12/15/2013 Imaging Repeat PET CT scan show complete response to treatment.   12/17/2013 -  Chemotherapy She begin maintenance rituximab every other month   02/23/2014 Procedure She had shave biopsy of the left upper, that came back well-differentiated squamous cell carcinoma.   04/29/2014 Imaging CT scan of the abdomen showed no evidence of disease recurrence.   10/12/2014 Imaging Repeat CT scan of the chest, abdomen and pelvis showed no evidence of recurrence.    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    INTERVAL HISTORY: Please see below for problem oriented charting. She is seen prior to Rituxan infusion. She complained of arthritis and has been taking NSAID intermittently. She complained of mild edema. Denies new skin lesions. No new lymphadenopathy or infection.  REVIEW OF SYSTEMS:   Constitutional: Denies fevers, chills or abnormal weight loss Eyes: Denies blurriness of vision Ears, nose, mouth, throat, and face: Denies mucositis or sore throat Respiratory: Denies cough, dyspnea or wheezes Cardiovascular: Denies palpitation, chest discomfort  Gastrointestinal:  Denies nausea, heartburn or change in bowel habits Skin: Denies abnormal skin rashes Lymphatics: Denies new lymphadenopathy or easy bruising Neurological:Denies numbness, tingling or new weaknesses Behavioral/Psych: Mood is stable, no new changes  All other systems were reviewed with the patient and are negative.  I have reviewed the past medical history, past surgical history, social history and family history with the patient  and they are unchanged from previous note.  ALLERGIES:  is allergic to  latex.  MEDICATIONS:  Current Outpatient Prescriptions  Medication Sig Dispense Refill  . acetaminophen (TYLENOL) 500 MG tablet Take 250 mg by mouth every 6 (six) hours as needed for moderate pain.    Marland Kitchen aspirin 81 MG tablet Take 81 mg by mouth at bedtime.     . B Complex Vitamins (B-COMPLEX/B-12 PO) Take by mouth.    Marland Kitchen CALCIUM-MAGNESIUM-ZINC PO Take 1 tablet by mouth daily.     . Cholecalciferol (VITAMIN D) 2000 UNITS tablet Take 2,000 Units by mouth daily.    . Cyanocobalamin (VITAMIN B 12 PO) Take 1 tablet by mouth daily.    Marland Kitchen docusate sodium (COLACE) 100 MG capsule Take 1 capsule (100 mg total) by mouth 2 (two) times daily. (Patient taking differently: Take 100 mg by mouth daily as needed for mild constipation. ) 100 capsule 1  . loratadine (CLARITIN) 10 MG tablet Take 10 mg by mouth daily.    Marland Kitchen losartan-hydrochlorothiazide (HYZAAR) 100-12.5 MG per tablet     . Omega-3 Fatty Acids (FISH OIL) 1000 MG CAPS Take by mouth 2 (two) times daily.    . pantoprazole (PROTONIX) 40 MG tablet Take 40 mg by mouth daily.     . potassium chloride SA (K-DUR,KLOR-CON) 20 MEQ tablet Take 20 mEq by mouth daily.     . Probiotic Product (PROBIOTIC DAILY PO) Take 1 capsule by mouth daily.    Marland Kitchen pyridOXINE (VITAMIN B-6) 100 MG tablet Take 100 mg by mouth daily.     . Red Yeast Rice 600 MG CAPS Take 1 capsule by mouth every evening.      No current facility-administered medications for this visit.   Facility-Administered Medications Ordered in Other Visits  Medication Dose Route Frequency Provider Last Rate Last Dose  . heparin lock flush 100 unit/mL  500 Units Intracatheter Once PRN Heath Lark, MD      . sodium chloride 0.9 % injection 10 mL  10 mL Intracatheter PRN Heath Lark, MD        PHYSICAL EXAMINATION: ECOG PERFORMANCE STATUS: 1 - Symptomatic but completely ambulatory  Filed Vitals:   12/16/14 0915  BP: 153/78  Pulse: 78  Temp: 98 F (36.7 C)  Resp: 18   Filed Weights   12/16/14 0915   Weight: 190 lb 1.6 oz (86.229 kg)    GENERAL:alert, no distress and comfortable SKIN: skin color, texture, turgor are normal, no rashes or significant lesions EYES: normal, Conjunctiva are pink and non-injected, sclera clear OROPHARYNX:no exudate, no erythema and lips, buccal mucosa, and tongue normal  NECK: supple, thyroid normal size, non-tender, without nodularity LYMPH:  no palpable lymphadenopathy in the cervical, axillary or inguinal LUNGS: clear to auscultation and percussion with normal breathing effort HEART: regular rate & rhythm and no murmurs with mild bilateral lower extremity edema ABDOMEN:abdomen soft, non-tender and normal bowel sounds Musculoskeletal:no cyanosis of digits and no clubbing  NEURO: alert & oriented x 3 with fluent speech, no focal motor/sensory deficits  LABORATORY DATA:  I have reviewed the data as listed    Component Value Date/Time   NA 140 12/16/2014 0841   NA 139 09/10/2014 1012   K 4.4 12/16/2014 0841   K 3.5 09/10/2014 1012   CL 104 09/10/2014 1012   CO2 24 12/16/2014 0841   CO2 27 09/10/2014 1012   GLUCOSE 93 12/16/2014 0841   GLUCOSE 97 09/10/2014 1012   BUN 16.0 12/16/2014 0841  BUN 12 09/10/2014 1012   CREATININE 1.0 12/16/2014 0841   CREATININE 0.88 09/10/2014 1012   CALCIUM 9.3 12/16/2014 0841   CALCIUM 9.1 09/10/2014 1012   PROT 7.0 12/16/2014 0841   PROT 6.8 09/10/2014 1012   ALBUMIN 4.3 12/16/2014 0841   ALBUMIN 4.5 09/10/2014 1012   AST 29 12/16/2014 0841   AST 31 09/10/2014 1012   ALT 34 12/16/2014 0841   ALT 29 09/10/2014 1012   ALKPHOS 91 12/16/2014 0841   ALKPHOS 81 09/10/2014 1012   BILITOT 0.63 12/16/2014 0841   BILITOT 0.9 09/10/2014 1012   GFRNONAA 66* 09/10/2014 1012   GFRAA 76* 09/10/2014 1012    No results found for: SPEP, UPEP  Lab Results  Component Value Date   WBC 3.3* 12/16/2014   NEUTROABS 2.1 12/16/2014   HGB 13.3 12/16/2014   HCT 40.0 12/16/2014   MCV 83.0 12/16/2014   PLT 200 12/16/2014       Chemistry      Component Value Date/Time   NA 140 12/16/2014 0841   NA 139 09/10/2014 1012   K 4.4 12/16/2014 0841   K 3.5 09/10/2014 1012   CL 104 09/10/2014 1012   CO2 24 12/16/2014 0841   CO2 27 09/10/2014 1012   BUN 16.0 12/16/2014 0841   BUN 12 09/10/2014 1012   CREATININE 1.0 12/16/2014 0841   CREATININE 0.88 09/10/2014 1012      Component Value Date/Time   CALCIUM 9.3 12/16/2014 0841   CALCIUM 9.1 09/10/2014 1012   ALKPHOS 91 12/16/2014 0841   ALKPHOS 81 09/10/2014 1012   AST 29 12/16/2014 0841   AST 31 09/10/2014 1012   ALT 34 12/16/2014 0841   ALT 29 09/10/2014 1012   BILITOT 0.63 12/16/2014 0841   BILITOT 0.9 09/10/2014 1012      ASSESSMENT & PLAN:  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 Repeat imaging showed no evidence of recurrence of disease. She will continue on current treatment for 2 years.     Leukopenia This is related to side effects of treatment. She is not symptomatic. Observed only.     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 I recommend the patient to take pictures of her leg so that this could be followed closely.     Bilateral lower extremity edema This is chronic in nature, I suspect this is because of recent NSAID intake. I recommend she reduce the frequency of taking those medicines that could cause fluid retention and hypertension   Essential hypertension, benign She has a mildly elevated blood pressure which she attributed to stress from coming here. We will monitor that carefully. Recent ingestion of NSAID can also contribute to this.   KNEE PAIN She has chronic arthritis and has been taken NSAID intermittently. I recommend she refrain from taking any more due to fluid retention and hypertension    No orders of the defined types were placed in this encounter.   All questions were answered.  The patient knows to call the clinic with any problems, questions or concerns. No barriers to learning was detected. I spent 25 minutes counseling the patient face to face. The total time spent in the appointment was 30 minutes and more than 50% was on counseling and review of test results     [facility], [place], MD 12/16/2014 10:49 AM

## 2014-12-16 NOTE — Assessment & Plan Note (Signed)
She has a mildly elevated blood pressure which she attributed to stress from coming here. We will monitor that carefully. Recent ingestion of NSAID can also contribute to this.

## 2014-12-16 NOTE — Assessment & Plan Note (Signed)
She has chronic arthritis and has been taken NSAID intermittently. I recommend she refrain from taking any more due to fluid retention and hypertension

## 2014-12-16 NOTE — Patient Instructions (Signed)
[facility] Discharge Instructions for Patients Receiving Chemotherapy  Today you received the following chemotherapy agents Rituximab.  To help prevent nausea and vomiting after your treatment, we encourage you to take your nausea medication as directed.   If you develop nausea and vomiting that is not controlled by your nausea medication, call the clinic.   BELOW ARE SYMPTOMS THAT SHOULD BE REPORTED IMMEDIATELY:  *FEVER GREATER THAN 100.5 F  *CHILLS WITH OR WITHOUT FEVER  NAUSEA AND VOMITING THAT IS NOT CONTROLLED WITH YOUR NAUSEA MEDICATION  *UNUSUAL SHORTNESS OF BREATH  *UNUSUAL BRUISING OR BLEEDING  TENDERNESS IN MOUTH AND THROAT WITH OR WITHOUT PRESENCE OF ULCERS  *URINARY PROBLEMS  *BOWEL PROBLEMS  UNUSUAL RASH Items with * indicate a potential emergency and should be followed up as soon as possible.  Feel free to call the clinic you have any questions or concerns. The clinic phone number is [phone].  Please show the CHEMO ALERT CARD at check-in to the Emergency Department and triage nurse.    

## 2014-12-16 NOTE — Assessment & Plan Note (Signed)
This is chronic in nature, I suspect this is because of recent NSAID intake. I recommend she reduce the frequency of taking those medicines that could cause fluid retention and hypertension

## 2014-12-16 NOTE — Assessment & Plan Note (Signed)
She will continue close follow-up with the dermatologist. I recommend the patient to take pictures of her leg so that this could be followed closely.

## 2014-12-16 NOTE — Assessment & Plan Note (Signed)
This is related to side effects of treatment. She is not symptomatic. Observed only.

## 2014-12-16 NOTE — Telephone Encounter (Signed)
Gave and printed appt sched and avs for pt for June °

## 2014-12-16 NOTE — Patient Instructions (Signed)

## 2014-12-16 NOTE — Assessment & Plan Note (Signed)
She tolerated treatment well. Repeat imaging showed no evidence of recurrence of disease. She will continue on current treatment for 2 years.

## 2015-01-08 DIAGNOSIS — L748 Other eccrine sweat disorders: Secondary | ICD-10-CM | POA: Diagnosis not present

## 2015-01-08 DIAGNOSIS — L609 Nail disorder, unspecified: Secondary | ICD-10-CM | POA: Diagnosis not present

## 2015-01-26 DIAGNOSIS — J069 Acute upper respiratory infection, unspecified: Secondary | ICD-10-CM | POA: Diagnosis not present

## 2015-01-26 DIAGNOSIS — R05 Cough: Secondary | ICD-10-CM | POA: Diagnosis not present

## 2015-02-09 DIAGNOSIS — J309 Allergic rhinitis, unspecified: Secondary | ICD-10-CM | POA: Diagnosis not present

## 2015-02-09 DIAGNOSIS — J069 Acute upper respiratory infection, unspecified: Secondary | ICD-10-CM | POA: Diagnosis not present

## 2015-02-24 ENCOUNTER — Ambulatory Visit (HOSPITAL_BASED_OUTPATIENT_CLINIC_OR_DEPARTMENT_OTHER): Payer: Medicare Other

## 2015-02-24 ENCOUNTER — Other Ambulatory Visit (HOSPITAL_BASED_OUTPATIENT_CLINIC_OR_DEPARTMENT_OTHER): Payer: Medicare Other

## 2015-02-24 ENCOUNTER — Ambulatory Visit (HOSPITAL_BASED_OUTPATIENT_CLINIC_OR_DEPARTMENT_OTHER): Payer: Medicare Other | Admitting: Hematology and Oncology

## 2015-02-24 ENCOUNTER — Encounter: Payer: Self-pay | Admitting: Hematology and Oncology

## 2015-02-24 ENCOUNTER — Telehealth: Payer: Self-pay | Admitting: Hematology and Oncology

## 2015-02-24 ENCOUNTER — Ambulatory Visit: Payer: Medicare Other

## 2015-02-24 VITALS — BP 146/85 | HR 66 | Temp 97.7°F | Resp 18 | Ht 62.0 in | Wt 189.4 lb

## 2015-02-24 VITALS — BP 120/67 | HR 71 | Temp 98.6°F | Resp 18

## 2015-02-24 DIAGNOSIS — C44729 Squamous cell carcinoma of skin of left lower limb, including hip: Secondary | ICD-10-CM

## 2015-02-24 DIAGNOSIS — Z5112 Encounter for antineoplastic immunotherapy: Secondary | ICD-10-CM | POA: Diagnosis not present

## 2015-02-24 DIAGNOSIS — C8239 Follicular lymphoma grade IIIa, extranodal and solid organ sites: Secondary | ICD-10-CM | POA: Diagnosis not present

## 2015-02-24 DIAGNOSIS — I10 Essential (primary) hypertension: Secondary | ICD-10-CM

## 2015-02-24 DIAGNOSIS — Z95828 Presence of other vascular implants and grafts: Secondary | ICD-10-CM

## 2015-02-24 LAB — CBC WITH DIFFERENTIAL/PLATELET
BASO%: 1.5 % (ref 0.0–2.0)
BASOS ABS: 0.1 10*3/uL (ref 0.0–0.1)
EOS ABS: 0.2 10*3/uL (ref 0.0–0.5)
EOS%: 4.5 % (ref 0.0–7.0)
HEMATOCRIT: 37.2 % (ref 34.8–46.6)
HEMOGLOBIN: 12.7 g/dL (ref 11.6–15.9)
LYMPH#: 0.8 10*3/uL — AB (ref 0.9–3.3)
LYMPH%: 17.9 % (ref 14.0–49.7)
MCH: 28.3 pg (ref 25.1–34.0)
MCHC: 34.1 g/dL (ref 31.5–36.0)
MCV: 83.1 fL (ref 79.5–101.0)
MONO#: 0.4 10*3/uL (ref 0.1–0.9)
MONO%: 9.6 % (ref 0.0–14.0)
NEUT%: 66.5 % (ref 38.4–76.8)
NEUTROS ABS: 2.9 10*3/uL (ref 1.5–6.5)
PLATELETS: 190 10*3/uL (ref 145–400)
RBC: 4.48 10*6/uL (ref 3.70–5.45)
RDW: 15.5 % — ABNORMAL HIGH (ref 11.2–14.5)
WBC: 4.4 10*3/uL (ref 3.9–10.3)

## 2015-02-24 LAB — COMPREHENSIVE METABOLIC PANEL (CC13)
ALT: 25 U/L (ref 0–55)
AST: 23 U/L (ref 5–34)
Albumin: 3.9 g/dL (ref 3.5–5.0)
Alkaline Phosphatase: 96 U/L (ref 40–150)
Anion Gap: 9 mEq/L (ref 3–11)
BUN: 16.5 mg/dL (ref 7.0–26.0)
CALCIUM: 9 mg/dL (ref 8.4–10.4)
CO2: 24 meq/L (ref 22–29)
Chloride: 105 mEq/L (ref 98–109)
Creatinine: 1 mg/dL (ref 0.6–1.1)
EGFR: 67 mL/min/{1.73_m2} — AB (ref >90)
Glucose: 100 mg/dl (ref 70–140)
POTASSIUM: 4.2 meq/L (ref 3.5–5.1)
Sodium: 138 mEq/L (ref 136–145)
Total Bilirubin: 0.37 mg/dL (ref 0.20–1.20)
Total Protein: 6.6 g/dL (ref 6.4–8.3)

## 2015-02-24 MED ORDER — DIPHENHYDRAMINE HCL 25 MG PO CAPS
50.0000 mg | ORAL_CAPSULE | Freq: Once | ORAL | Status: AC
  Administered 2015-02-24: 50 mg via ORAL

## 2015-02-24 MED ORDER — SODIUM CHLORIDE 0.9 % IJ SOLN
10.0000 mL | INTRAMUSCULAR | Status: DC | PRN
  Administered 2015-02-24 (×2): 10 mL via INTRAVENOUS
  Filled 2015-02-24: qty 10

## 2015-02-24 MED ORDER — DIPHENHYDRAMINE HCL 25 MG PO CAPS
ORAL_CAPSULE | ORAL | Status: AC
  Filled 2015-02-24: qty 2

## 2015-02-24 MED ORDER — ACETAMINOPHEN 325 MG PO TABS
ORAL_TABLET | ORAL | Status: AC
  Filled 2015-02-24: qty 2

## 2015-02-24 MED ORDER — HEPARIN SOD (PORK) LOCK FLUSH 100 UNIT/ML IV SOLN
500.0000 [IU] | Freq: Once | INTRAVENOUS | Status: AC | PRN
  Administered 2015-02-24: 500 [IU]
  Filled 2015-02-24: qty 5

## 2015-02-24 MED ORDER — ACETAMINOPHEN 325 MG PO TABS
650.0000 mg | ORAL_TABLET | Freq: Once | ORAL | Status: AC
  Administered 2015-02-24: 650 mg via ORAL

## 2015-02-24 MED ORDER — SODIUM CHLORIDE 0.9 % IV SOLN
Freq: Once | INTRAVENOUS | Status: AC
  Administered 2015-02-24: 09:00:00 via INTRAVENOUS

## 2015-02-24 MED ORDER — SODIUM CHLORIDE 0.9 % IV SOLN
375.0000 mg/m2 | Freq: Once | INTRAVENOUS | Status: AC
  Administered 2015-02-24: 700 mg via INTRAVENOUS
  Filled 2015-02-24: qty 70

## 2015-02-24 MED ORDER — SODIUM CHLORIDE 0.9 % IJ SOLN
10.0000 mL | INTRAMUSCULAR | Status: DC | PRN
  Filled 2015-02-24: qty 10

## 2015-02-24 NOTE — Telephone Encounter (Signed)
Gave and printed appt sched and avs for pt for Aug °

## 2015-02-24 NOTE — Assessment & Plan Note (Signed)
One of the lesion on the back of her left leg seems to be getting a little worse. I recommend she contact her dermatologist for another biopsy and excision of the skin lesion.

## 2015-02-24 NOTE — Patient Instructions (Signed)

## 2015-02-24 NOTE — Patient Instructions (Signed)
[facility] Discharge Instructions for Patients   Today you received the following: Rituxan.   To help prevent nausea and vomiting after your treatment, we encourage you to take your nausea medication as directed.    If you develop nausea and vomiting that is not controlled by your nausea medication, call the clinic.   BELOW ARE SYMPTOMS THAT SHOULD BE REPORTED IMMEDIATELY:  *FEVER GREATER THAN 100.5 F  *CHILLS WITH OR WITHOUT FEVER  NAUSEA AND VOMITING THAT IS NOT CONTROLLED WITH YOUR NAUSEA MEDICATION  *UNUSUAL SHORTNESS OF BREATH  *UNUSUAL BRUISING OR BLEEDING  TENDERNESS IN MOUTH AND THROAT WITH OR WITHOUT PRESENCE OF ULCERS  *URINARY PROBLEMS  *BOWEL PROBLEMS  UNUSUAL RASH Items with * indicate a potential emergency and should be followed up as soon as possible.  Feel free to call the clinic you have any questions or concerns. The clinic phone number is [phone].  Please show the CHEMO ALERT CARD at check-in to the Emergency Department and triage nurse.   

## 2015-02-24 NOTE — Progress Notes (Signed)
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 Needle biopsy was inconclusive.     08/02/2011 Imaging CT scan of the chest abdomen and pelvis showed numerous lymphadenopathy above and below the diaphragm.    08/16/2011 Imaging MRI of the abdomen revealed enlarging left kidney lesion worrisome for malignancy   09/20/2011 Initial Diagnosis Lymphoma, low grade from kidney biopsy, follicular   8/33/8250 Procedure Histologic type: Non-Hodgkin's lymphoma, follicular center cell type. Grade (if applicable): Favor high grade, Ki-67 ranges from 10-50%. Immunohistochemical stains: CD20, CD79a, CD10, BCL-2, BCL-6, CD21, CD3, CD43.   05/09/2013 Procedure BM biopsy is highly suspicious of involvement. Patient had persistent leukopenia   05/09/2013 Imaging PET/CT showed significant involvement of LN everywhere   07/16/2013 - 11/05/2013 Chemotherapy She completed 6 cycles of R. CHOP chemotherapy.   07/23/2013 - 07/27/2013 Hospital Admission Admitted for management of mucositis and neutropenic fever. Blood culture was positive for gram negative rods, resolved with antibiotics   10/08/2013 Adverse Reaction The dose of  vincristine is reduced by 50%   12/15/2013 Imaging Repeat PET CT scan show complete response to treatment.   12/17/2013 -  Chemotherapy She begin maintenance rituximab every other month   02/23/2014 Procedure She had shave biopsy of the left upper, that came back well-differentiated squamous cell carcinoma.   04/29/2014 Imaging CT scan of the abdomen showed no evidence of disease recurrence.   10/12/2014 Imaging Repeat CT scan of the chest, abdomen and pelvis showed no evidence of recurrence.    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    INTERVAL HISTORY: Please see below for problem oriented charting. She is seen prior to cycle 8 of treatment. She is feeling well. Denies new lymphadenopathy. She had one episode of sinus infection 6 weeks ago, treated successfully with anti-biotic therapy. She has noted new skin lesion on the back of her calf which is getting a little worse. She complained of very mild trace bilateral lower extremity edema.  REVIEW OF SYSTEMS:   Constitutional: Denies fevers, chills or abnormal weight loss Eyes: Denies blurriness of vision Ears, nose, mouth, throat, and face: Denies mucositis or sore throat Respiratory: Denies cough, dyspnea or wheezes Cardiovascular: Denies palpitation, chest discomfort  Gastrointestinal:  Denies nausea, heartburn or change in bowel habits Lymphatics: Denies new lymphadenopathy or easy bruising Neurological:Denies numbness, tingling or new weaknesses Behavioral/Psych: Mood is stable, no new changes  All other systems were reviewed with the  patient and are negative.  I have reviewed the past medical history, past surgical history, social history and  family history with the patient and they are unchanged from previous note.  ALLERGIES:  is allergic to latex.  MEDICATIONS:  Current Outpatient Prescriptions  Medication Sig Dispense Refill  . acetaminophen (TYLENOL) 500 MG tablet Take 250 mg by mouth every 6 (six) hours as needed for moderate pain.    Marland Kitchen aspirin 81 MG tablet Take 81 mg by mouth at bedtime.     . B Complex Vitamins (B-COMPLEX/B-12 PO) Take by mouth.    Marland Kitchen CALCIUM-MAGNESIUM-ZINC PO Take 1 tablet by mouth daily.     . Cholecalciferol (VITAMIN D) 2000 UNITS tablet Take 2,000 Units by mouth daily.    . Cyanocobalamin (VITAMIN B 12 PO) Take 1 tablet by mouth daily.    Marland Kitchen docusate sodium (COLACE) 100 MG capsule Take 1 capsule (100 mg total) by mouth 2 (two) times daily. (Patient taking differently: Take 100 mg by mouth daily as needed for mild constipation. ) 100 capsule 1  . loratadine (CLARITIN) 10 MG tablet Take 10 mg by mouth daily.    Marland Kitchen losartan-hydrochlorothiazide (HYZAAR) 100-12.5 MG per tablet     . Omega-3 Fatty Acids (FISH OIL) 1000 MG CAPS Take by mouth 2 (two) times daily.    . pantoprazole (PROTONIX) 40 MG tablet Take 40 mg by mouth daily.     . potassium chloride SA (K-DUR,KLOR-CON) 20 MEQ tablet Take 20 mEq by mouth daily.     . Probiotic Product (PROBIOTIC DAILY PO) Take 1 capsule by mouth daily.    Marland Kitchen pyridOXINE (VITAMIN B-6) 100 MG tablet Take 100 mg by mouth daily.     . Red Yeast Rice 600 MG CAPS Take 1 capsule by mouth every evening.      No current facility-administered medications for this visit.   Facility-Administered Medications Ordered in Other Visits  Medication Dose Route Frequency Provider Last Rate Last Dose  . sodium chloride 0.9 % injection 10 mL  10 mL Intravenous PRN Heath Lark, MD   10 mL at 02/24/15 0803    PHYSICAL EXAMINATION: ECOG PERFORMANCE STATUS: 1 - Symptomatic but completely ambulatory  Filed Vitals:   02/24/15 0827  BP: 146/85  Pulse: 66  Temp: 97.7 F (36.5 C)  Resp: 18    Filed Weights   02/24/15 0827  Weight: 189 lb 6.4 oz (85.911 kg)    GENERAL:alert, no distress and comfortable SKIN: Noted a suspicious skin lesion on the back of her calf, suspicious for skin cancer EYES: normal, Conjunctiva are pink and non-injected, sclera clear OROPHARYNX:no exudate, no erythema and lips, buccal mucosa, and tongue normal  NECK: supple, thyroid normal size, non-tender, without nodularity LYMPH:  no palpable lymphadenopathy in the cervical, axillary or inguinal LUNGS: clear to auscultation and percussion with normal breathing effort HEART: regular rate & rhythm and no murmurs with trace bilateral lower extremity edema ABDOMEN:abdomen soft, non-tender and normal bowel sounds Musculoskeletal:no cyanosis of digits and no clubbing  NEURO: alert & oriented x 3 with fluent speech, no focal motor/sensory deficits  LABORATORY DATA:  I have reviewed the data as listed    Component Value Date/Time   NA 140 12/16/2014 0841   NA 139 09/10/2014 1012   K 4.4 12/16/2014 0841   K 3.5 09/10/2014 1012   CL 104 09/10/2014 1012   CO2 24 12/16/2014 0841   CO2 27 09/10/2014 1012   GLUCOSE 93 12/16/2014 0841   GLUCOSE 97  09/10/2014 1012   BUN 16.0 12/16/2014 0841   BUN 12 09/10/2014 1012   CREATININE 1.0 12/16/2014 0841   CREATININE 0.88 09/10/2014 1012   CALCIUM 9.3 12/16/2014 0841   CALCIUM 9.1 09/10/2014 1012   PROT 7.0 12/16/2014 0841   PROT 6.8 09/10/2014 1012   ALBUMIN 4.3 12/16/2014 0841   ALBUMIN 4.5 09/10/2014 1012   AST 29 12/16/2014 0841   AST 31 09/10/2014 1012   ALT 34 12/16/2014 0841   ALT 29 09/10/2014 1012   ALKPHOS 91 12/16/2014 0841   ALKPHOS 81 09/10/2014 1012   BILITOT 0.63 12/16/2014 0841   BILITOT 0.9 09/10/2014 1012   GFRNONAA 66* 09/10/2014 1012   GFRAA 76* 09/10/2014 1012    No results found for: SPEP, UPEP  Lab Results  Component Value Date   WBC 4.4 02/24/2015   NEUTROABS 2.9 02/24/2015   HGB 12.7 02/24/2015   HCT 37.2 02/24/2015    MCV 83.1 02/24/2015   PLT 190 02/24/2015      Chemistry      Component Value Date/Time   NA 140 12/16/2014 0841   NA 139 09/10/2014 1012   K 4.4 12/16/2014 0841   K 3.5 09/10/2014 1012   CL 104 09/10/2014 1012   CO2 24 12/16/2014 0841   CO2 27 09/10/2014 1012   BUN 16.0 12/16/2014 0841   BUN 12 09/10/2014 1012   CREATININE 1.0 12/16/2014 0841   CREATININE 0.88 09/10/2014 1012      Component Value Date/Time   CALCIUM 9.3 12/16/2014 0841   CALCIUM 9.1 09/10/2014 1012   ALKPHOS 91 12/16/2014 0841   ALKPHOS 81 09/10/2014 1012   AST 29 12/16/2014 0841   AST 31 09/10/2014 1012   ALT 34 12/16/2014 0841   ALT 29 09/10/2014 1012   BILITOT 0.63 12/16/2014 0841   BILITOT 0.9 09/10/2014 1012     ASSESSMENT & PLAN:  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 Repeat imaging showed no evidence of recurrence of disease. She will continue on current treatment for 2 years.     Essential hypertension, benign Her blood pressure remained mildly elevated. I recommend salt reduction and continue on her blood pressure medication. She is advised to follow-up with primary care doctor for medication adjustment.  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 I recommend she contact her dermatologist for another biopsy and excision of the skin lesion.    No orders of the defined types were placed in this encounter.   All questions were answered. The patient knows to call the clinic with any problems, questions or concerns. No barriers to learning was detected. I spent 25 minutes counseling the patient face to face. The total time spent in the appointment was 30 minutes and more than 50% was on counseling and review of test results     [facility], [place], MD 02/24/2015 8:52 AM

## 2015-02-24 NOTE — Assessment & Plan Note (Signed)
Her blood pressure remained mildly elevated. I recommend salt reduction and continue on her blood pressure medication. She is advised to follow-up with primary care doctor for medication adjustment.

## 2015-02-24 NOTE — Assessment & Plan Note (Signed)
She tolerated treatment well. Repeat imaging showed no evidence of recurrence of disease. She will continue on current treatment for 2 years.

## 2015-02-25 ENCOUNTER — Encounter: Payer: Self-pay | Admitting: General Practice

## 2015-02-25 NOTE — Progress Notes (Signed)
Spiritual Care Note  Received inquiry from Ms Gaugh re lymphoma support resources.  Followed up twice by phone, both to clarify her question and to confirm that I have four brochures from the [employer] ([employer]) , including about the [employer] First Connections program that links patients and families with a trained peer volunteer who has gone through a similar experience.  Ms Donati verbalized appreciation.  Will mail these with a personal note and my card this afternoon.  [place], Margaret

## 2015-02-27 IMAGING — CR DG SKULL COMPLETE 4+V
4 series · 4 of 4 positions shown · non-contrast
Comparison: Head CT 06/15/2012.

CLINICAL DATA: Depression in the skull. Assess for lytic lesion.
Lymphoma.

EXAM:
SKULL - COMPLETE 4 + VIEW

[[person_name] * (1 of 2)]
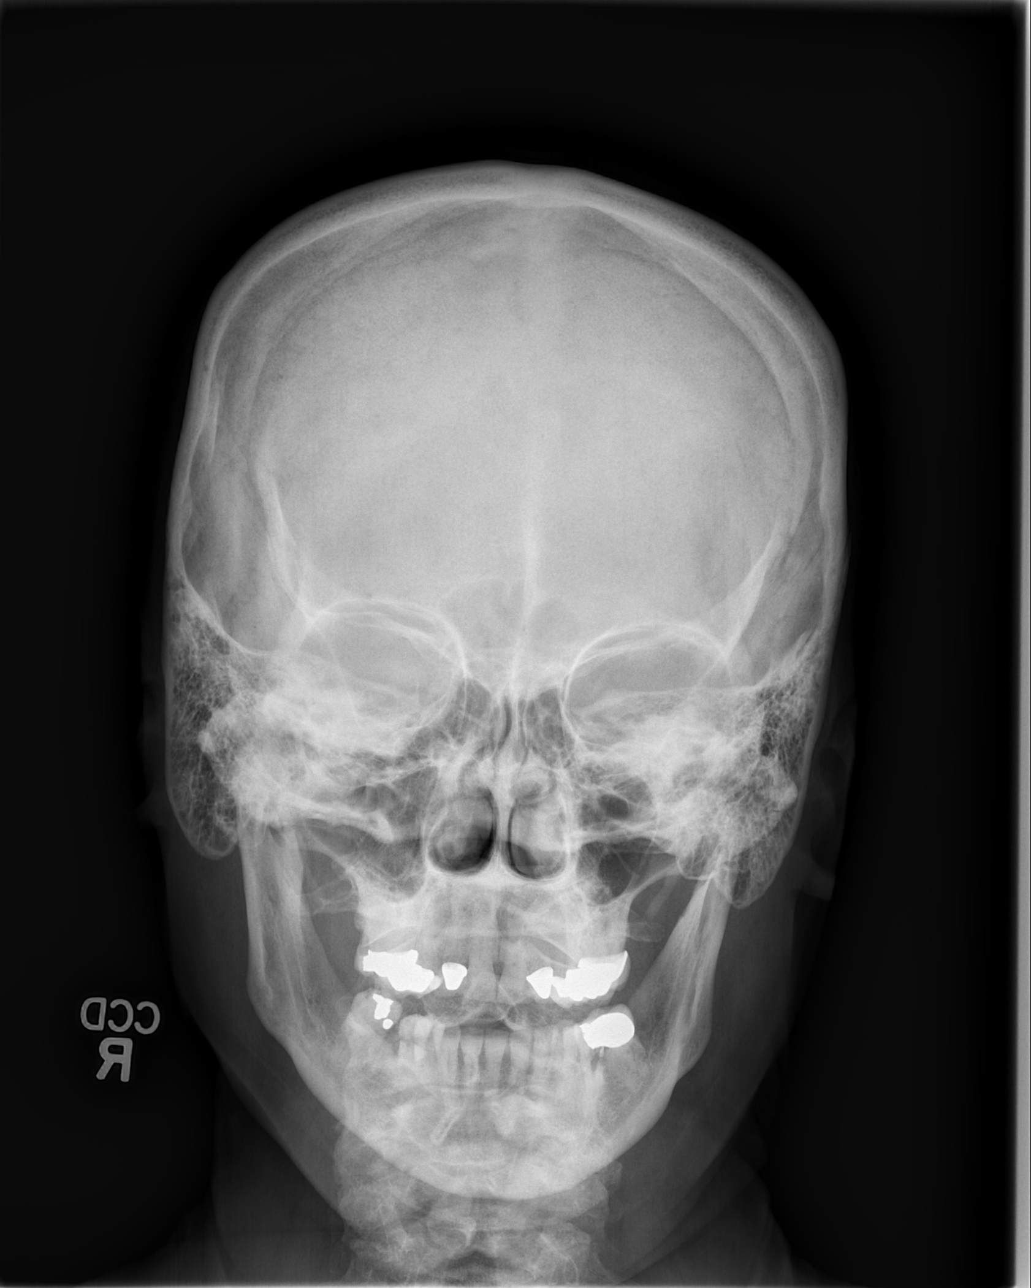

[w skull lat * (1 of 2)]
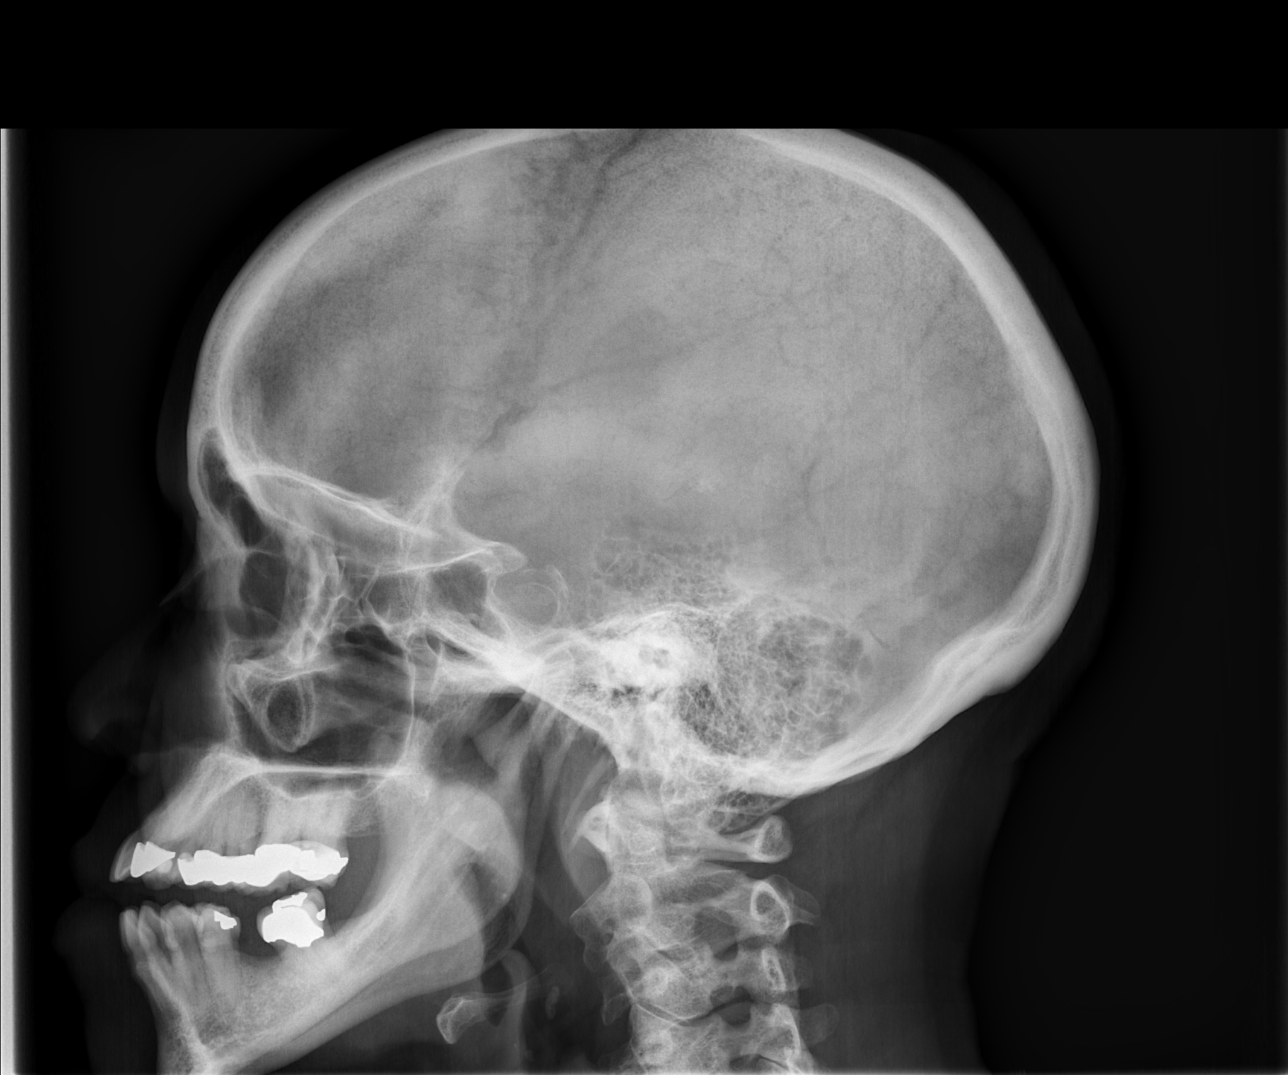

[w skull lat * (2 of 2)]
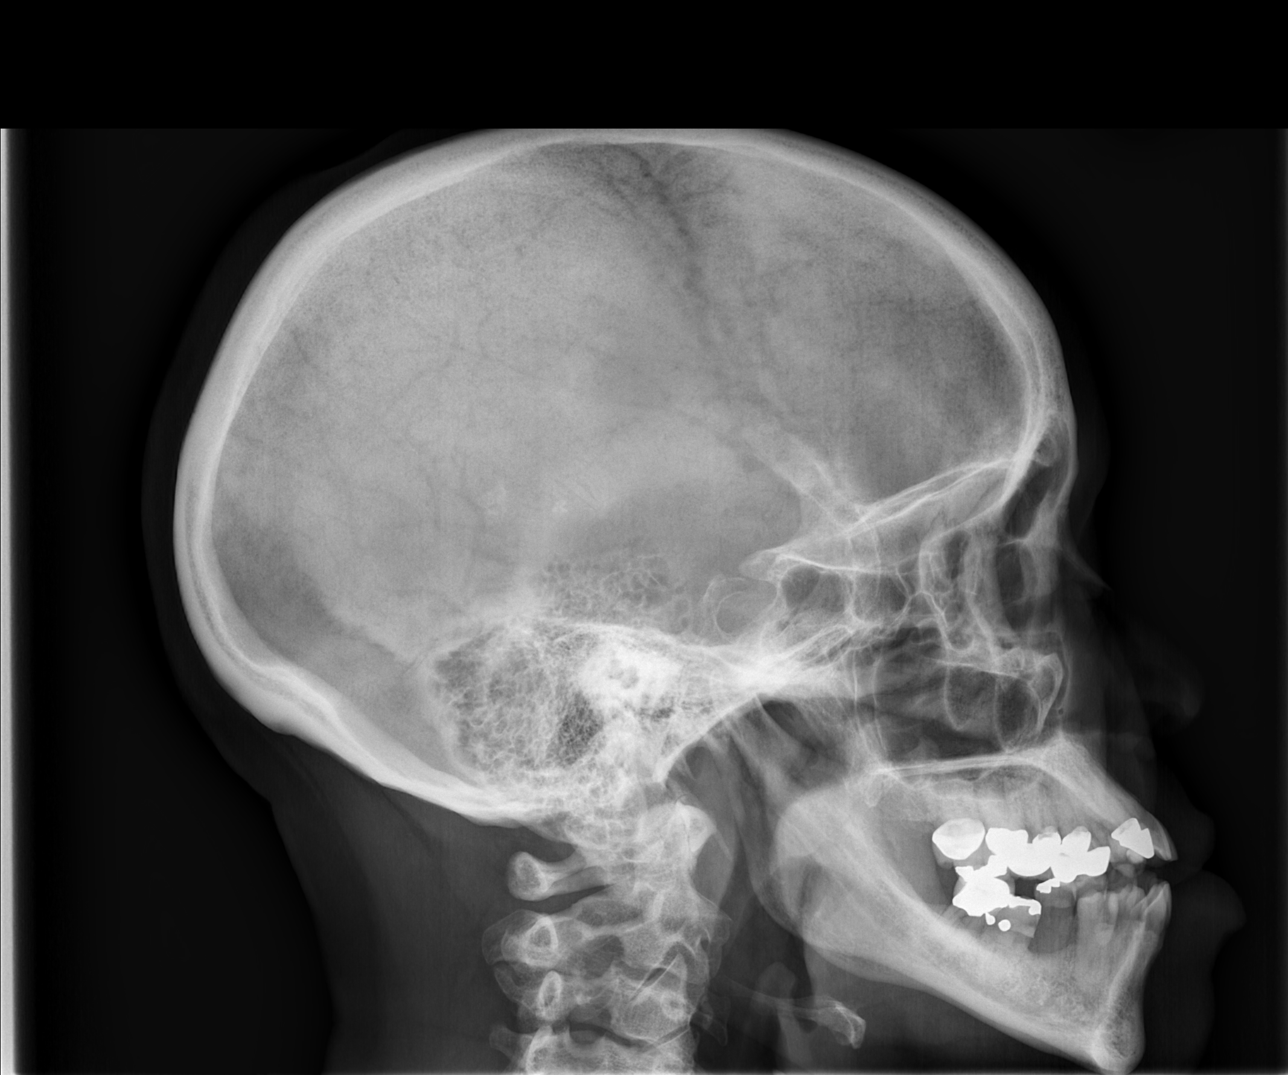

[[person_name] * (2 of 2)]
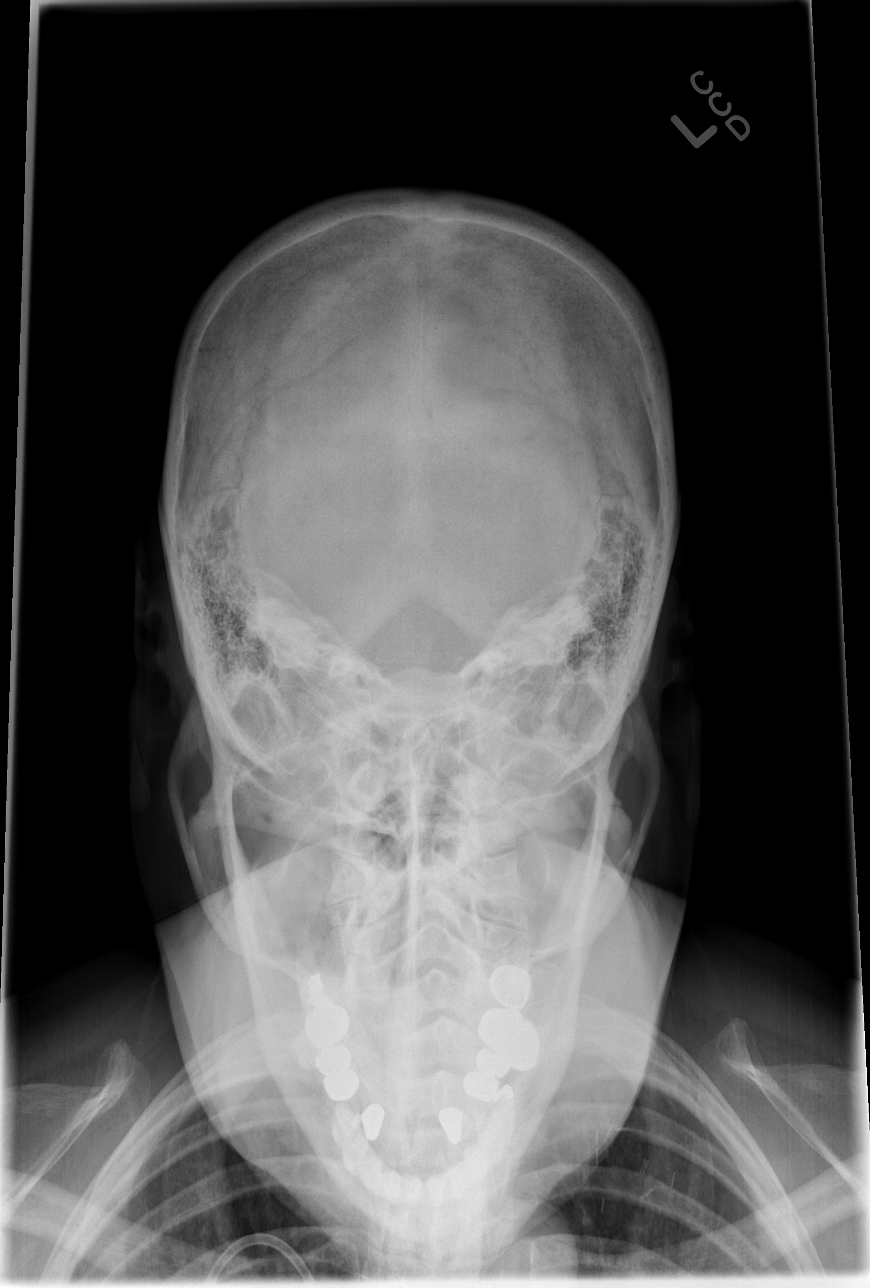

[4 of 4 positions shown; findings below may reference images not displayed]

FINDINGS: No lytic calvarial lesion. No explanation for the physical finding.
The examination is normal.
IMPRESSION: No lytic lesion..

## 2015-03-11 DIAGNOSIS — Z08 Encounter for follow-up examination after completed treatment for malignant neoplasm: Secondary | ICD-10-CM | POA: Diagnosis not present

## 2015-03-11 DIAGNOSIS — Z85828 Personal history of other malignant neoplasm of skin: Secondary | ICD-10-CM | POA: Diagnosis not present

## 2015-03-11 DIAGNOSIS — L438 Other lichen planus: Secondary | ICD-10-CM | POA: Diagnosis not present

## 2015-03-19 DIAGNOSIS — L748 Other eccrine sweat disorders: Secondary | ICD-10-CM | POA: Diagnosis not present

## 2015-03-19 DIAGNOSIS — L609 Nail disorder, unspecified: Secondary | ICD-10-CM | POA: Diagnosis not present

## 2015-03-25 DIAGNOSIS — L438 Other lichen planus: Secondary | ICD-10-CM | POA: Diagnosis not present

## 2015-03-25 DIAGNOSIS — L818 Other specified disorders of pigmentation: Secondary | ICD-10-CM | POA: Diagnosis not present

## 2015-03-25 DIAGNOSIS — B029 Zoster without complications: Secondary | ICD-10-CM | POA: Diagnosis not present

## 2015-03-26 ENCOUNTER — Telehealth: Payer: Self-pay | Admitting: *Deleted

## 2015-03-26 NOTE — Telephone Encounter (Signed)
Shingles vaccine is not recommended for patients with lymphoma

## 2015-03-26 NOTE — Telephone Encounter (Signed)
Pt called to say she went to dermatologist and was prescribed valcyclovir X 7 days for shingles. To see PCP next week.   She wants to know if it is OK to get shingles vaccine?

## 2015-03-26 NOTE — Telephone Encounter (Signed)
Notified patient that Dr Alvy Bimler states shingles vaccine is not recommended to patients with Lymphoma. Verbalized understanding

## 2015-03-31 DIAGNOSIS — M25512 Pain in left shoulder: Secondary | ICD-10-CM | POA: Diagnosis not present

## 2015-04-21 ENCOUNTER — Ambulatory Visit (HOSPITAL_BASED_OUTPATIENT_CLINIC_OR_DEPARTMENT_OTHER): Payer: Medicare Other

## 2015-04-21 ENCOUNTER — Ambulatory Visit (HOSPITAL_BASED_OUTPATIENT_CLINIC_OR_DEPARTMENT_OTHER): Payer: Medicare Other | Admitting: Hematology and Oncology

## 2015-04-21 ENCOUNTER — Ambulatory Visit: Payer: Medicare Other

## 2015-04-21 ENCOUNTER — Other Ambulatory Visit (HOSPITAL_BASED_OUTPATIENT_CLINIC_OR_DEPARTMENT_OTHER): Payer: Medicare Other

## 2015-04-21 ENCOUNTER — Telehealth: Payer: Self-pay | Admitting: Hematology and Oncology

## 2015-04-21 VITALS — BP 153/77 | HR 67 | Temp 98.2°F | Resp 18 | Ht 62.0 in | Wt 189.5 lb

## 2015-04-21 VITALS — BP 125/56 | HR 72 | Temp 98.0°F | Resp 20

## 2015-04-21 DIAGNOSIS — C8239 Follicular lymphoma grade IIIa, extranodal and solid organ sites: Secondary | ICD-10-CM | POA: Diagnosis not present

## 2015-04-21 DIAGNOSIS — C44729 Squamous cell carcinoma of skin of left lower limb, including hip: Secondary | ICD-10-CM

## 2015-04-21 DIAGNOSIS — I10 Essential (primary) hypertension: Secondary | ICD-10-CM

## 2015-04-21 DIAGNOSIS — Z5112 Encounter for antineoplastic immunotherapy: Secondary | ICD-10-CM

## 2015-04-21 DIAGNOSIS — Z95828 Presence of other vascular implants and grafts: Secondary | ICD-10-CM

## 2015-04-21 LAB — CBC WITH DIFFERENTIAL/PLATELET
BASO%: 1.9 % (ref 0.0–2.0)
BASOS ABS: 0.1 10*3/uL (ref 0.0–0.1)
EOS%: 4.9 % (ref 0.0–7.0)
Eosinophils Absolute: 0.1 10*3/uL (ref 0.0–0.5)
HCT: 37.7 % (ref 34.8–46.6)
HEMOGLOBIN: 12.7 g/dL (ref 11.6–15.9)
LYMPH%: 26 % (ref 14.0–49.7)
MCH: 28 pg (ref 25.1–34.0)
MCHC: 33.7 g/dL (ref 31.5–36.0)
MCV: 83.3 fL (ref 79.5–101.0)
MONO#: 0.3 10*3/uL (ref 0.1–0.9)
MONO%: 10.9 % (ref 0.0–14.0)
NEUT%: 56.3 % (ref 38.4–76.8)
NEUTROS ABS: 1.5 10*3/uL (ref 1.5–6.5)
Platelets: 184 10*3/uL (ref 145–400)
RBC: 4.52 10*6/uL (ref 3.70–5.45)
RDW: 15.5 % — ABNORMAL HIGH (ref 11.2–14.5)
WBC: 2.7 10*3/uL — ABNORMAL LOW (ref 3.9–10.3)
lymph#: 0.7 10*3/uL — ABNORMAL LOW (ref 0.9–3.3)

## 2015-04-21 LAB — COMPREHENSIVE METABOLIC PANEL (CC13)
ALT: 25 U/L (ref 0–55)
ANION GAP: 8 meq/L (ref 3–11)
AST: 25 U/L (ref 5–34)
Albumin: 4 g/dL (ref 3.5–5.0)
Alkaline Phosphatase: 74 U/L (ref 40–150)
BILIRUBIN TOTAL: 0.83 mg/dL (ref 0.20–1.20)
BUN: 19.3 mg/dL (ref 7.0–26.0)
CO2: 27 meq/L (ref 22–29)
CREATININE: 1.1 mg/dL (ref 0.6–1.1)
Calcium: 9.3 mg/dL (ref 8.4–10.4)
Chloride: 106 mEq/L (ref 98–109)
EGFR: 60 mL/min/{1.73_m2} — AB (ref >90)
GLUCOSE: 96 mg/dL (ref 70–140)
POTASSIUM: 3.8 meq/L (ref 3.5–5.1)
SODIUM: 140 meq/L (ref 136–145)
Total Protein: 6.5 g/dL (ref 6.4–8.3)

## 2015-04-21 MED ORDER — DIPHENHYDRAMINE HCL 25 MG PO CAPS
ORAL_CAPSULE | ORAL | Status: AC
  Filled 2015-04-21: qty 2

## 2015-04-21 MED ORDER — SODIUM CHLORIDE 0.9 % IV SOLN
375.0000 mg/m2 | Freq: Once | INTRAVENOUS | Status: AC
  Administered 2015-04-21: 700 mg via INTRAVENOUS
  Filled 2015-04-21: qty 70

## 2015-04-21 MED ORDER — ACETAMINOPHEN 325 MG PO TABS
650.0000 mg | ORAL_TABLET | Freq: Once | ORAL | Status: AC
  Administered 2015-04-21: 650 mg via ORAL

## 2015-04-21 MED ORDER — ACETAMINOPHEN 325 MG PO TABS
ORAL_TABLET | ORAL | Status: AC
  Filled 2015-04-21: qty 2

## 2015-04-21 MED ORDER — SODIUM CHLORIDE 0.9 % IV SOLN
Freq: Once | INTRAVENOUS | Status: AC
  Administered 2015-04-21: 09:00:00 via INTRAVENOUS

## 2015-04-21 MED ORDER — HEPARIN SOD (PORK) LOCK FLUSH 100 UNIT/ML IV SOLN
500.0000 [IU] | Freq: Once | INTRAVENOUS | Status: AC | PRN
  Administered 2015-04-21: 500 [IU]
  Filled 2015-04-21: qty 5

## 2015-04-21 MED ORDER — SODIUM CHLORIDE 0.9 % IJ SOLN
10.0000 mL | INTRAMUSCULAR | Status: DC | PRN
  Administered 2015-04-21: 10 mL
  Filled 2015-04-21: qty 10

## 2015-04-21 MED ORDER — DIPHENHYDRAMINE HCL 25 MG PO CAPS
50.0000 mg | ORAL_CAPSULE | Freq: Once | ORAL | Status: AC
  Administered 2015-04-21: 50 mg via ORAL

## 2015-04-21 MED ORDER — SODIUM CHLORIDE 0.9 % IJ SOLN
10.0000 mL | INTRAMUSCULAR | Status: DC | PRN
  Administered 2015-04-21: 10 mL via INTRAVENOUS
  Filled 2015-04-21: qty 10

## 2015-04-21 NOTE — Patient Instructions (Signed)

## 2015-04-21 NOTE — Progress Notes (Signed)
Vital signs stable post 1st 30 minutes of Rituxan infusion

## 2015-04-21 NOTE — Assessment & Plan Note (Signed)
She tolerated treatment well. Repeat imaging showed no evidence of recurrence of disease. She will continue on current treatment for 2 years.

## 2015-04-21 NOTE — Assessment & Plan Note (Signed)
She has seen a dermatologist recently and currently is on topical treatment.

## 2015-04-21 NOTE — Telephone Encounter (Signed)
Appointments made and avs will be printed in chemo  anne

## 2015-04-21 NOTE — Assessment & Plan Note (Signed)
Her last blood pressure reading remain high. She is currently on medical management. I recommend weight loss exercise and reduce salt intake. Recommend she contacts her primary care doctor for medication adjustment.

## 2015-04-21 NOTE — Progress Notes (Signed)
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 Needle biopsy was inconclusive.     08/02/2011 Imaging CT scan of the chest abdomen and pelvis showed numerous lymphadenopathy above and below the diaphragm.    08/16/2011 Imaging MRI of the abdomen revealed enlarging left kidney lesion worrisome for malignancy   09/20/2011 Initial Diagnosis Lymphoma, low grade from kidney biopsy, follicular   8/33/8250 Procedure Histologic type: Non-Hodgkin's lymphoma, follicular center cell type. Grade (if applicable): Favor high grade, Ki-67 ranges from 10-50%. Immunohistochemical stains: CD20, CD79a, CD10, BCL-2, BCL-6, CD21, CD3, CD43.   05/09/2013 Procedure BM biopsy is highly suspicious of involvement. Patient had persistent leukopenia   05/09/2013 Imaging PET/CT showed significant involvement of LN everywhere   07/16/2013 - 11/05/2013 Chemotherapy She completed 6 cycles of R. CHOP chemotherapy.   07/23/2013 - 07/27/2013 Hospital Admission Admitted for management of mucositis and neutropenic fever. Blood culture was positive for gram negative rods, resolved with antibiotics   10/08/2013 Adverse Reaction The dose of  vincristine is reduced by 50%   12/15/2013 Imaging Repeat PET CT scan show complete response to treatment.   12/17/2013 -  Chemotherapy She begin maintenance rituximab every other month   02/23/2014 Procedure She had shave biopsy of the left upper, that came back well-differentiated squamous cell carcinoma.   04/29/2014 Imaging CT scan of the abdomen showed no evidence of disease recurrence.   10/12/2014 Imaging Repeat CT scan of the chest, abdomen and pelvis showed no evidence of recurrence.    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    INTERVAL HISTORY: Please see below for problem oriented charting. She returns prior to cycle 9 of treatment. 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  REVIEW OF SYSTEMS:   Constitutional: Denies fevers, chills or abnormal weight loss Eyes: Denies blurriness of vision Ears, nose, mouth, throat, and face: Denies mucositis or sore throat Respiratory: Denies cough, dyspnea or wheezes Cardiovascular: Denies palpitation, chest discomfort or lower extremity swelling Gastrointestinal:  Denies nausea, heartburn or change in bowel habits Skin: Denies abnormal skin rashes Lymphatics: Denies new lymphadenopathy or easy bruising Neurological:Denies numbness, tingling or new weaknesses Behavioral/Psych: Mood is stable, no new changes  All other systems were reviewed with the patient and are negative.  I have reviewed the past medical history, past surgical history, social history and family history with the patient  and they are unchanged from previous note.  ALLERGIES:  is allergic to  latex.  MEDICATIONS:  Current Outpatient Prescriptions  Medication Sig Dispense Refill  . acetaminophen (TYLENOL) 500 MG tablet Take 250 mg by mouth every 6 (six) hours as needed for moderate pain.    Marland Kitchen aspirin 81 MG tablet Take 81 mg by mouth at bedtime.     . B Complex Vitamins (B-COMPLEX/B-12 PO) Take by mouth.    Marland Kitchen CALCIUM-MAGNESIUM-ZINC PO Take 1 tablet by mouth daily.     . Cholecalciferol (VITAMIN D) 2000 UNITS tablet Take 2,000 Units by mouth daily.    . Cyanocobalamin (VITAMIN B 12 PO) Take 1 tablet by mouth daily.    Marland Kitchen docusate sodium (COLACE) 100 MG capsule Take 1 capsule (100 mg total) by mouth 2 (two) times daily. (Patient taking differently: Take 100 mg by mouth daily as needed for mild constipation. ) 100 capsule 1  . loratadine (CLARITIN) 10 MG tablet Take 10 mg by mouth daily.    Marland Kitchen losartan-hydrochlorothiazide (HYZAAR) 100-12.5 MG per tablet     . Omega-3 Fatty Acids (FISH OIL) 1000 MG CAPS Take by mouth 2 (two) times daily.    . pantoprazole (PROTONIX) 40 MG tablet Take 40 mg by mouth daily.     . potassium chloride SA (K-DUR,KLOR-CON) 20 MEQ tablet Take 20 mEq by mouth daily.     . Probiotic Product (PROBIOTIC DAILY PO) Take 1 capsule by mouth daily.    Marland Kitchen pyridOXINE (VITAMIN B-6) 100 MG tablet Take 100 mg by mouth daily.     . Red Yeast Rice 600 MG CAPS Take 1 capsule by mouth every evening.      No current facility-administered medications for this visit.   Facility-Administered Medications Ordered in Other Visits  Medication Dose Route Frequency Provider Last Rate Last Dose  . heparin lock flush 100 unit/mL  500 Units Intracatheter Once PRN Heath Lark, MD      . sodium chloride 0.9 % injection 10 mL  10 mL Intracatheter PRN Heath Lark, MD        PHYSICAL EXAMINATION: ECOG PERFORMANCE STATUS: 0 - Asymptomatic  Filed Vitals:   04/21/15 0822  BP: 153/77  Pulse: 67  Temp: 98.2 F (36.8 C)  Resp: 18   Filed Weights   04/21/15 0822  Weight: 189 lb 8 oz (85.957  kg)    GENERAL:alert, no distress and comfortable SKIN: She has persistent skin lesions in both legs. EYES: normal, Conjunctiva are pink and non-injected, sclera clear OROPHARYNX:no exudate, no erythema and lips, buccal mucosa, and tongue normal  NECK: supple, thyroid normal size, non-tender, without nodularity LYMPH:  no palpable lymphadenopathy in the cervical, axillary or inguinal LUNGS: clear to auscultation and percussion with normal breathing effort HEART: regular rate & rhythm and no murmurs and no lower extremity edema ABDOMEN:abdomen soft, non-tender and normal bowel sounds Musculoskeletal:no cyanosis of digits and no clubbing  NEURO: alert & oriented x 3 with fluent speech, no focal motor/sensory deficits  LABORATORY DATA:  I have reviewed the data as listed    Component Value Date/Time   NA 140 04/21/2015 0800   NA 139 09/10/2014 1012   K 3.8 04/21/2015 0800   K 3.5 09/10/2014 1012   CL 104 09/10/2014 1012   CO2 27 04/21/2015 0800   CO2 27 09/10/2014 1012   GLUCOSE 96 04/21/2015 0800   GLUCOSE 97 09/10/2014 1012   BUN 19.3 04/21/2015 0800   BUN 12 09/10/2014 1012   CREATININE  1.1 04/21/2015 0800   CREATININE 0.88 09/10/2014 1012   CALCIUM 9.3 04/21/2015 0800   CALCIUM 9.1 09/10/2014 1012   PROT 6.5 04/21/2015 0800   PROT 6.8 09/10/2014 1012   ALBUMIN 4.0 04/21/2015 0800   ALBUMIN 4.5 09/10/2014 1012   AST 25 04/21/2015 0800   AST 31 09/10/2014 1012   ALT 25 04/21/2015 0800   ALT 29 09/10/2014 1012   ALKPHOS 74 04/21/2015 0800   ALKPHOS 81 09/10/2014 1012   BILITOT 0.83 04/21/2015 0800   BILITOT 0.9 09/10/2014 1012   GFRNONAA 66* 09/10/2014 1012   GFRAA 76* 09/10/2014 1012    No results found for: SPEP, UPEP  Lab Results  Component Value Date   WBC 2.7* 04/21/2015   NEUTROABS 1.5 04/21/2015   HGB 12.7 04/21/2015   HCT 37.7 04/21/2015   MCV 83.3 04/21/2015   PLT 184 04/21/2015      Chemistry      Component Value Date/Time   NA 140 04/21/2015  0800   NA 139 09/10/2014 1012   K 3.8 04/21/2015 0800   K 3.5 09/10/2014 1012   CL 104 09/10/2014 1012   CO2 27 04/21/2015 0800   CO2 27 09/10/2014 1012   BUN 19.3 04/21/2015 0800   BUN 12 09/10/2014 1012   CREATININE 1.1 04/21/2015 0800   CREATININE 0.88 09/10/2014 1012      Component Value Date/Time   CALCIUM 9.3 04/21/2015 0800   CALCIUM 9.1 09/10/2014 1012   ALKPHOS 74 04/21/2015 0800   ALKPHOS 81 09/10/2014 1012   AST 25 04/21/2015 0800   AST 31 09/10/2014 1012   ALT 25 04/21/2015 0800   ALT 29 09/10/2014 1012   BILITOT 0.83 04/21/2015 0800   BILITOT 0.9 09/10/2014 1012    ASSESSMENT & PLAN:  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 Repeat imaging showed no evidence of recurrence of disease. She will continue on current treatment for 2 years.     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   Essential hypertension, benign Her last blood pressure reading remain high. She is currently on medical management. I recommend weight loss exercise and reduce salt intake. Recommend she contacts her primary care doctor for medication adjustment.   No orders of the defined types were placed in this encounter.   All questions were answered. The patient knows to call the clinic with any problems, questions or concerns. No barriers to learning was detected. I spent 15 minutes counseling the patient face to face. The total time spent in the appointment was 20 minutes and more than 50% was on counseling and review of test results     [facility], [place], MD 04/21/2015 9:36 AM

## 2015-04-21 NOTE — Patient Instructions (Signed)
[facility] Discharge Instructions for Patients Receiving Chemotherapy  Today you received the following chemotherapy agents: Rituxan   To help prevent nausea and vomiting after your treatment, we encourage you to take your nausea medication as directed.    If you develop nausea and vomiting that is not controlled by your nausea medication, call the clinic.   BELOW ARE SYMPTOMS THAT SHOULD BE REPORTED IMMEDIATELY:  *FEVER GREATER THAN 100.5 F  *CHILLS WITH OR WITHOUT FEVER  NAUSEA AND VOMITING THAT IS NOT CONTROLLED WITH YOUR NAUSEA MEDICATION  *UNUSUAL SHORTNESS OF BREATH  *UNUSUAL BRUISING OR BLEEDING  TENDERNESS IN MOUTH AND THROAT WITH OR WITHOUT PRESENCE OF ULCERS  *URINARY PROBLEMS  *BOWEL PROBLEMS  UNUSUAL RASH Items with * indicate a potential emergency and should be followed up as soon as possible.  Feel free to call the clinic you have any questions or concerns. The clinic phone number is [phone].  Please show the CHEMO ALERT CARD at check-in to the Emergency Department and triage nurse.   

## 2015-04-28 ENCOUNTER — Other Ambulatory Visit: Payer: Self-pay

## 2015-04-28 DIAGNOSIS — Z1231 Encounter for screening mammogram for malignant neoplasm of breast: Secondary | ICD-10-CM

## 2015-05-10 ENCOUNTER — Telehealth: Payer: Self-pay | Admitting: Hematology and Oncology

## 2015-05-10 ENCOUNTER — Encounter: Payer: Self-pay | Admitting: Hematology and Oncology

## 2015-05-10 ENCOUNTER — Telehealth: Payer: Self-pay | Admitting: *Deleted

## 2015-05-10 ENCOUNTER — Other Ambulatory Visit: Payer: Self-pay | Admitting: Hematology and Oncology

## 2015-05-10 DIAGNOSIS — R300 Dysuria: Secondary | ICD-10-CM

## 2015-05-10 NOTE — Telephone Encounter (Signed)
Informed pt of Dr. Calton Dach instructions below.  She verbalized understanding to stop potassium, take anti emetics as needed and appt on Wed at 8:30 am.  She will call us to cancel if she feels better by Wed morning.

## 2015-05-10 NOTE — Telephone Encounter (Signed)
Please proceed to take the left over antiemetics and stop potassium supplement I will put POF to see her on Wed morning at 9 am with labs at 830 am If she feels better that morning she can cancel the appt

## 2015-05-10 NOTE — Telephone Encounter (Signed)
Pt reports nausea since Rituxan tx on 8/10.   States usually worse in the mornings and late at night.   It got worse last night and she vomited x 1.  She has not taken any anti emetics, says she does have some nausea meds left over from her chemotherapy treatments.  She is not sure if she should take them, she wanted Dr. Alvy Bimler to be aware of the nausea.  Denies fevers or diarrhea.

## 2015-05-10 NOTE — Telephone Encounter (Signed)
Spoke with patient and she is aware of her  8/31 appointment

## 2015-05-12 ENCOUNTER — Encounter: Payer: Self-pay | Admitting: Cardiology

## 2015-05-12 ENCOUNTER — Telehealth: Payer: Self-pay | Admitting: Hematology and Oncology

## 2015-05-12 ENCOUNTER — Telehealth: Payer: Self-pay | Admitting: Cardiology

## 2015-05-12 ENCOUNTER — Ambulatory Visit (HOSPITAL_BASED_OUTPATIENT_CLINIC_OR_DEPARTMENT_OTHER): Payer: Medicare Other | Admitting: Hematology and Oncology

## 2015-05-12 ENCOUNTER — Other Ambulatory Visit (HOSPITAL_BASED_OUTPATIENT_CLINIC_OR_DEPARTMENT_OTHER): Payer: Medicare Other

## 2015-05-12 VITALS — BP 127/69 | HR 72 | Temp 98.4°F | Resp 18 | Ht 62.0 in | Wt 186.9 lb

## 2015-05-12 DIAGNOSIS — C44729 Squamous cell carcinoma of skin of left lower limb, including hip: Secondary | ICD-10-CM | POA: Diagnosis not present

## 2015-05-12 DIAGNOSIS — R300 Dysuria: Secondary | ICD-10-CM | POA: Diagnosis not present

## 2015-05-12 DIAGNOSIS — C8239 Follicular lymphoma grade IIIa, extranodal and solid organ sites: Secondary | ICD-10-CM | POA: Diagnosis not present

## 2015-05-12 DIAGNOSIS — R110 Nausea: Secondary | ICD-10-CM | POA: Diagnosis not present

## 2015-05-12 LAB — COMPREHENSIVE METABOLIC PANEL (CC13)
ALBUMIN: 4.2 g/dL (ref 3.5–5.0)
ALK PHOS: 79 U/L (ref 40–150)
ALT: 21 U/L (ref 0–55)
AST: 21 U/L (ref 5–34)
Anion Gap: 10 mEq/L (ref 3–11)
BUN: 17.6 mg/dL (ref 7.0–26.0)
CALCIUM: 9.5 mg/dL (ref 8.4–10.4)
CO2: 28 mEq/L (ref 22–29)
CREATININE: 1.3 mg/dL — AB (ref 0.6–1.1)
Chloride: 104 mEq/L (ref 98–109)
EGFR: 51 mL/min/{1.73_m2} — ABNORMAL LOW (ref >90)
GLUCOSE: 95 mg/dL (ref 70–140)
Potassium: 3.7 mEq/L (ref 3.5–5.1)
Sodium: 141 mEq/L (ref 136–145)
Total Bilirubin: 0.8 mg/dL (ref 0.20–1.20)
Total Protein: 6.7 g/dL (ref 6.4–8.3)

## 2015-05-12 LAB — CBC WITH DIFFERENTIAL/PLATELET
BASO%: 1.5 % (ref 0.0–2.0)
Basophils Absolute: 0 10*3/uL (ref 0.0–0.1)
EOS%: 6 % (ref 0.0–7.0)
Eosinophils Absolute: 0.2 10*3/uL (ref 0.0–0.5)
HEMATOCRIT: 38.9 % (ref 34.8–46.6)
HEMOGLOBIN: 13.3 g/dL (ref 11.6–15.9)
LYMPH#: 0.8 10*3/uL — AB (ref 0.9–3.3)
LYMPH%: 24.8 % (ref 14.0–49.7)
MCH: 28.4 pg (ref 25.1–34.0)
MCHC: 34.3 g/dL (ref 31.5–36.0)
MCV: 82.9 fL (ref 79.5–101.0)
MONO#: 0.3 10*3/uL (ref 0.1–0.9)
MONO%: 10.7 % (ref 0.0–14.0)
NEUT%: 57 % (ref 38.4–76.8)
NEUTROS ABS: 1.7 10*3/uL (ref 1.5–6.5)
Platelets: 184 10*3/uL (ref 145–400)
RBC: 4.69 10*6/uL (ref 3.70–5.45)
RDW: 15.5 % — AB (ref 11.2–14.5)
WBC: 3.1 10*3/uL — AB (ref 3.9–10.3)

## 2015-05-12 LAB — URINALYSIS, MICROSCOPIC - CHCC
BILIRUBIN (URINE): NEGATIVE
Blood: NEGATIVE
GLUCOSE UR CHCC: NEGATIVE mg/dL
KETONES: NEGATIVE mg/dL
Nitrite: NEGATIVE
PH: 7 (ref 4.6–8.0)
PROTEIN: NEGATIVE mg/dL
SPECIFIC GRAVITY, URINE: 1.01 (ref 1.003–1.035)
Urobilinogen, UR: 0.2 mg/dL (ref 0.2–1)

## 2015-05-12 NOTE — Progress Notes (Signed)
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 Needle biopsy was inconclusive.     08/02/2011 Imaging CT scan of the chest abdomen and pelvis showed numerous lymphadenopathy above and below the diaphragm.    08/16/2011 Imaging MRI of the abdomen revealed enlarging left kidney lesion worrisome for malignancy   09/20/2011 Initial Diagnosis Lymphoma, low grade from kidney biopsy, follicular   8/75/6433 Procedure Histologic type: Non-Hodgkin's lymphoma, follicular center cell type. Grade (if applicable): Favor high grade, Ki-67 ranges from 10-50%. Immunohistochemical stains: CD20, CD79a, CD10, BCL-2, BCL-6, CD21, CD3, CD43.   05/09/2013 Procedure BM biopsy is highly suspicious of involvement. Patient had persistent leukopenia   05/09/2013 Imaging PET/CT showed significant involvement of LN everywhere   07/16/2013 - 11/05/2013 Chemotherapy She completed 6 cycles of R. CHOP chemotherapy.   07/23/2013 - 07/27/2013 Hospital Admission Admitted for management of mucositis and neutropenic fever. Blood culture was positive for gram negative rods, resolved with antibiotics   10/08/2013 Adverse Reaction The dose of  vincristine is reduced by 50%   12/15/2013 Imaging Repeat PET CT scan show complete response to treatment.   12/17/2013 -  Chemotherapy She begin maintenance rituximab every other month   02/23/2014 Procedure She had shave biopsy of the left upper, that came back well-differentiated squamous cell carcinoma.   04/29/2014 Imaging CT scan of the abdomen showed no evidence of disease recurrence.   10/12/2014 Imaging Repeat CT scan of the chest, abdomen and pelvis showed no evidence of recurrence.    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    INTERVAL HISTORY: Please see below for problem oriented charting.  she is seen urgently today because of persistent nausea over the past week.  she denies dysuria, frequency or urgency. She has intermittent constipation in the past. She has chronic reflux symptoms but denies excessive ingestions of NSAID. There were no changes in the diet. She denies vomiting.  we informed the patient to stop potassium supplement several days ago and she felt a bit better.  REVIEW OF SYSTEMS:   Constitutional: Denies fevers, chills or abnormal weight loss Eyes: Denies blurriness of vision Ears, nose, mouth, throat, and face: Denies mucositis or sore throat Respiratory: Denies cough, dyspnea or wheezes Cardiovascular: Denies palpitation, chest discomfort or lower extremity swelling Skin: Denies abnormal skin rashes Lymphatics: Denies new lymphadenopathy or easy bruising Neurological:Denies numbness, tingling or new weaknesses Behavioral/Psych: Mood is stable, no new changes  All  other systems were reviewed with the patient and are negative.  I have reviewed the past medical history,  past surgical history, social history and family history with the patient and they are unchanged from previous note.  ALLERGIES:  is allergic to latex.  MEDICATIONS:  Current Outpatient Prescriptions  Medication Sig Dispense Refill  . acetaminophen (TYLENOL) 500 MG tablet Take 250 mg by mouth every 6 (six) hours as needed for moderate pain.    Marland Kitchen aspirin 81 MG tablet Take 81 mg by mouth at bedtime.     . B Complex Vitamins (B-COMPLEX/B-12 PO) Take by mouth.    Marland Kitchen CALCIUM-MAGNESIUM-ZINC PO Take 1 tablet by mouth daily.     . Cholecalciferol (VITAMIN D) 2000 UNITS tablet Take 2,000 Units by mouth daily.    . Cyanocobalamin (VITAMIN B 12 PO) Take 1 tablet by mouth daily.    Marland Kitchen docusate sodium (COLACE) 100 MG capsule Take 1 capsule (100 mg total) by mouth 2 (two) times daily. (Patient taking differently: Take 100 mg by mouth daily as needed for mild constipation. ) 100 capsule 1  . loratadine (CLARITIN) 10 MG tablet Take 10 mg by mouth daily.    Marland Kitchen losartan-hydrochlorothiazide (HYZAAR) 100-12.5 MG per tablet     . Omega-3 Fatty Acids (FISH OIL) 1000 MG CAPS Take by mouth 2 (two) times daily.    . pantoprazole (PROTONIX) 40 MG tablet Take 40 mg by mouth daily.     . Probiotic Product (PROBIOTIC DAILY PO) Take 1 capsule by mouth daily.    Marland Kitchen pyridOXINE (VITAMIN B-6) 100 MG tablet Take 100 mg by mouth daily.     . Red Yeast Rice 600 MG CAPS Take 1 capsule by mouth every evening.      No current facility-administered medications for this visit.    PHYSICAL EXAMINATION: ECOG PERFORMANCE STATUS: 0 - Asymptomatic  Filed Vitals:   05/12/15 0847  BP: 127/69  Pulse: 72  Temp: 98.4 F (36.9 C)  Resp: 18   Filed Weights   05/12/15 0847  Weight: 186 lb 14.4 oz (84.777 kg)    GENERAL:alert, no distress and comfortable SKIN: skin color, texture, turgor are normal, no rashes or significant lesions EYES: normal, Conjunctiva are pink and non-injected, sclera clear OROPHARYNX:no exudate, no erythema  and lips, buccal mucosa, and tongue normal  NECK: supple, thyroid normal size, non-tender, without nodularity LYMPH:  no palpable lymphadenopathy in the cervical, axillary or inguinal LUNGS: clear to auscultation and percussion with normal breathing effort HEART: regular rate & rhythm and no murmurs and no lower extremity edema ABDOMEN:abdomen soft, non-tender and normal bowel sounds. She has no epigastric tenderness Musculoskeletal:no cyanosis of digits and no clubbing  NEURO: alert & oriented x 3 with fluent speech, no focal motor/sensory deficits  LABORATORY DATA:  I have reviewed the data as listed    Component Value Date/Time   NA 141 05/12/2015 0827   NA 139 09/10/2014 1012   K 3.7 05/12/2015 0827   K 3.5 09/10/2014 1012   CL 104 09/10/2014 1012   CO2 28 05/12/2015 0827   CO2 27 09/10/2014 1012   GLUCOSE 95 05/12/2015 0827   GLUCOSE 97 09/10/2014 1012   BUN 17.6 05/12/2015 0827   BUN 12 09/10/2014 1012   CREATININE 1.3* 05/12/2015 0827   CREATININE 0.88 09/10/2014 1012   CALCIUM 9.5 05/12/2015 0827   CALCIUM 9.1 09/10/2014 1012   PROT 6.7 05/12/2015 0827   PROT 6.8 09/10/2014 1012   ALBUMIN 4.2 05/12/2015 0827  ALBUMIN 4.5 09/10/2014 1012   AST 21 05/12/2015 0827   AST 31 09/10/2014 1012   ALT 21 05/12/2015 0827   ALT 29 09/10/2014 1012   ALKPHOS 79 05/12/2015 0827   ALKPHOS 81 09/10/2014 1012   BILITOT 0.80 05/12/2015 0827   BILITOT 0.9 09/10/2014 1012   GFRNONAA 66* 09/10/2014 1012   GFRAA 76* 09/10/2014 1012    No results found for: SPEP, UPEP  Lab Results  Component Value Date   WBC 3.1* 05/12/2015   NEUTROABS 1.7 05/12/2015   HGB 13.3 05/12/2015   HCT 38.9 05/12/2015   MCV 82.9 05/12/2015   PLT 184 05/12/2015      Chemistry      Component Value Date/Time   NA 141 05/12/2015 0827   NA 139 09/10/2014 1012   K 3.7 05/12/2015 0827   K 3.5 09/10/2014 1012   CL 104 09/10/2014 1012   CO2 28 05/12/2015 0827   CO2 27 09/10/2014 1012   BUN 17.6  05/12/2015 0827   BUN 12 09/10/2014 1012   CREATININE 1.3* 05/12/2015 0827   CREATININE 0.88 09/10/2014 1012      Component Value Date/Time   CALCIUM 9.5 05/12/2015 0827   CALCIUM 9.1 09/10/2014 1012   ALKPHOS 79 05/12/2015 0827   ALKPHOS 81 09/10/2014 1012   AST 21 05/12/2015 0827   AST 31 09/10/2014 1012   ALT 21 05/12/2015 0827   ALT 29 09/10/2014 1012   BILITOT 0.80 05/12/2015 0827   BILITOT 0.9 09/10/2014 1012      ASSESSMENT & PLAN:  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 Continue treatment as scheduled in October.  Nausea without vomiting She has mild nausea recently. Differential diagnosis include reflux disease, gastric ulcer, recent potassium replacement therapy, chronic constipation or untreated urinary tract infection. Since  I told her to hold potassium supplement, her nausea has improved. Her potassium level is adequate today. I recommend she continues to hold potassium replacement therapy. If she develops severe hypokalemia in the future, we can consider switching out her antihypertensives. Urinalysis is still pending. If her urinalysis showed evidence of urinary tract infection, I will call the patient for prescription antibiotics therapy.  In the meantime, she is also instructed to take anti-emetics as needed.   No orders of the defined types were placed in this encounter.   All questions were answered. The patient knows to call the clinic with any problems, questions or concerns. No barriers to learning was detected. I spent 15 minutes counseling the patient face to face. The total time spent in the appointment was 20 minutes and more than 50% was on counseling and review of test results     [facility], Marquavius Scaife, MD 05/12/2015 10:14 AM

## 2015-05-12 NOTE — Telephone Encounter (Signed)
per pof to sch pt appt-gave pt copy of avs °

## 2015-05-12 NOTE — Assessment & Plan Note (Signed)
She has mild nausea recently. Differential diagnosis include reflux disease, gastric ulcer, recent potassium replacement therapy, chronic constipation or untreated urinary tract infection. Since  I told her to hold potassium supplement, her nausea has improved. Her potassium level is adequate today. I recommend she continues to hold potassium replacement therapy. If she develops severe hypokalemia in the future, we can consider switching out her antihypertensives. Urinalysis is still pending. If her urinalysis showed evidence of urinary tract infection, I will call the patient for prescription antibiotics therapy.  In the meantime, she is also instructed to take anti-emetics as needed.

## 2015-05-12 NOTE — Assessment & Plan Note (Signed)
She is doing well from the lymphoma standpoint. Continue treatment as scheduled in October.

## 2015-05-13 LAB — URINE CULTURE

## 2015-05-13 NOTE — Telephone Encounter (Signed)
Close encounter 

## 2015-06-09 ENCOUNTER — Ambulatory Visit
Admission: RE | Admit: 2015-06-09 | Discharge: 2015-06-09 | Disposition: A | Discharge status: Home / Self Care | Payer: Medicare Other | Source: Ambulatory Visit

## 2015-06-09 DIAGNOSIS — Z1231 Encounter for screening mammogram for malignant neoplasm of breast: Secondary | ICD-10-CM

## 2015-06-23 ENCOUNTER — Ambulatory Visit (HOSPITAL_BASED_OUTPATIENT_CLINIC_OR_DEPARTMENT_OTHER): Payer: Medicare Other | Admitting: Hematology and Oncology

## 2015-06-23 ENCOUNTER — Other Ambulatory Visit (HOSPITAL_BASED_OUTPATIENT_CLINIC_OR_DEPARTMENT_OTHER): Payer: Medicare Other

## 2015-06-23 ENCOUNTER — Encounter: Payer: Self-pay | Admitting: Hematology and Oncology

## 2015-06-23 ENCOUNTER — Telehealth: Payer: Self-pay | Admitting: Hematology and Oncology

## 2015-06-23 ENCOUNTER — Ambulatory Visit: Payer: Medicare Other

## 2015-06-23 ENCOUNTER — Ambulatory Visit (HOSPITAL_BASED_OUTPATIENT_CLINIC_OR_DEPARTMENT_OTHER): Payer: Medicare Other

## 2015-06-23 VITALS — BP 114/64 | HR 66 | Temp 97.8°F | Resp 18

## 2015-06-23 VITALS — BP 159/69 | HR 69 | Temp 98.1°F | Resp 18 | Ht 62.0 in | Wt 191.5 lb

## 2015-06-23 DIAGNOSIS — Z23 Encounter for immunization: Secondary | ICD-10-CM

## 2015-06-23 DIAGNOSIS — C8239 Follicular lymphoma grade IIIa, extranodal and solid organ sites: Secondary | ICD-10-CM

## 2015-06-23 DIAGNOSIS — Z0000 Encounter for general adult medical examination without abnormal findings: Secondary | ICD-10-CM | POA: Insufficient documentation

## 2015-06-23 DIAGNOSIS — T451X5A Adverse effect of antineoplastic and immunosuppressive drugs, initial encounter: Secondary | ICD-10-CM

## 2015-06-23 DIAGNOSIS — I10 Essential (primary) hypertension: Secondary | ICD-10-CM

## 2015-06-23 DIAGNOSIS — C44729 Squamous cell carcinoma of skin of left lower limb, including hip: Secondary | ICD-10-CM | POA: Diagnosis not present

## 2015-06-23 DIAGNOSIS — D61810 Antineoplastic chemotherapy induced pancytopenia: Secondary | ICD-10-CM | POA: Diagnosis not present

## 2015-06-23 DIAGNOSIS — Z95828 Presence of other vascular implants and grafts: Secondary | ICD-10-CM

## 2015-06-23 DIAGNOSIS — Z5112 Encounter for antineoplastic immunotherapy: Secondary | ICD-10-CM | POA: Diagnosis not present

## 2015-06-23 LAB — CBC WITH DIFFERENTIAL/PLATELET
BASO%: 1.5 % (ref 0.0–2.0)
Basophils Absolute: 0 10*3/uL (ref 0.0–0.1)
EOS%: 4.9 % (ref 0.0–7.0)
Eosinophils Absolute: 0.1 10*3/uL (ref 0.0–0.5)
HEMATOCRIT: 38.2 % (ref 34.8–46.6)
HGB: 13 g/dL (ref 11.6–15.9)
LYMPH#: 0.7 10*3/uL — AB (ref 0.9–3.3)
LYMPH%: 25.9 % (ref 14.0–49.7)
MCH: 28.1 pg (ref 25.1–34.0)
MCHC: 34.1 g/dL (ref 31.5–36.0)
MCV: 82.6 fL (ref 79.5–101.0)
MONO#: 0.3 10*3/uL (ref 0.1–0.9)
MONO%: 9.4 % (ref 0.0–14.0)
NEUT#: 1.6 10*3/uL (ref 1.5–6.5)
NEUT%: 58.3 % (ref 38.4–76.8)
PLATELETS: 194 10*3/uL (ref 145–400)
RBC: 4.62 10*6/uL (ref 3.70–5.45)
RDW: 14.8 % — ABNORMAL HIGH (ref 11.2–14.5)
WBC: 2.8 10*3/uL — AB (ref 3.9–10.3)

## 2015-06-23 LAB — COMPREHENSIVE METABOLIC PANEL (CC13)
ALT: 23 U/L (ref 0–55)
AST: 30 U/L (ref 5–34)
Albumin: 4.3 g/dL (ref 3.5–5.0)
Alkaline Phosphatase: 82 U/L (ref 40–150)
Anion Gap: 10 mEq/L (ref 3–11)
BILIRUBIN TOTAL: 0.68 mg/dL (ref 0.20–1.20)
BUN: 14.3 mg/dL (ref 7.0–26.0)
CHLORIDE: 106 meq/L (ref 98–109)
CO2: 26 meq/L (ref 22–29)
CREATININE: 0.9 mg/dL (ref 0.6–1.1)
Calcium: 9.4 mg/dL (ref 8.4–10.4)
EGFR: 71 mL/min/{1.73_m2} — ABNORMAL LOW (ref >90)
Glucose: 98 mg/dl (ref 70–140)
Potassium: 3.6 mEq/L (ref 3.5–5.1)
Sodium: 142 mEq/L (ref 136–145)
TOTAL PROTEIN: 6.8 g/dL (ref 6.4–8.3)

## 2015-06-23 MED ORDER — ACETAMINOPHEN 325 MG PO TABS
ORAL_TABLET | ORAL | Status: AC
  Filled 2015-06-23: qty 2

## 2015-06-23 MED ORDER — HEPARIN SOD (PORK) LOCK FLUSH 100 UNIT/ML IV SOLN
500.0000 [IU] | Freq: Once | INTRAVENOUS | Status: AC | PRN
  Administered 2015-06-23: 500 [IU]
  Filled 2015-06-23: qty 5

## 2015-06-23 MED ORDER — SODIUM CHLORIDE 0.9 % IV SOLN
Freq: Once | INTRAVENOUS | Status: AC
  Administered 2015-06-23: 10:00:00 via INTRAVENOUS

## 2015-06-23 MED ORDER — INFLUENZA VAC SPLIT QUAD 0.5 ML IM SUSY
0.5000 mL | PREFILLED_SYRINGE | Freq: Once | INTRAMUSCULAR | Status: AC
  Administered 2015-06-23: 0.5 mL via INTRAMUSCULAR
  Filled 2015-06-23: qty 0.5

## 2015-06-23 MED ORDER — SODIUM CHLORIDE 0.9 % IJ SOLN
10.0000 mL | INTRAMUSCULAR | Status: DC | PRN
  Administered 2015-06-23: 10 mL via INTRAVENOUS
  Filled 2015-06-23: qty 10

## 2015-06-23 MED ORDER — SODIUM CHLORIDE 0.9 % IJ SOLN
10.0000 mL | INTRAMUSCULAR | Status: DC | PRN
  Administered 2015-06-23: 10 mL
  Filled 2015-06-23: qty 10

## 2015-06-23 MED ORDER — DIPHENHYDRAMINE HCL 25 MG PO CAPS
50.0000 mg | ORAL_CAPSULE | Freq: Once | ORAL | Status: AC
  Administered 2015-06-23: 50 mg via ORAL

## 2015-06-23 MED ORDER — DIPHENHYDRAMINE HCL 25 MG PO CAPS
ORAL_CAPSULE | ORAL | Status: AC
  Filled 2015-06-23: qty 2

## 2015-06-23 MED ORDER — ACETAMINOPHEN 325 MG PO TABS
650.0000 mg | ORAL_TABLET | Freq: Once | ORAL | Status: AC
  Administered 2015-06-23: 650 mg via ORAL

## 2015-06-23 MED ORDER — SODIUM CHLORIDE 0.9 % IV SOLN
375.0000 mg/m2 | Freq: Once | INTRAVENOUS | Status: AC
  Administered 2015-06-23: 700 mg via INTRAVENOUS
  Filled 2015-06-23: qty 70

## 2015-06-23 NOTE — Assessment & Plan Note (Signed)
She has seen a dermatologist recently and currently is on topical treatment.  

## 2015-06-23 NOTE — Assessment & Plan Note (Signed)
We discussed the importance of preventive care and reviewed the vaccination programs. She does not have any prior allergic reactions to influenza vaccination. She agrees to proceed with influenza vaccination today and we will administer it today at the clinic.  

## 2015-06-23 NOTE — Assessment & Plan Note (Signed)
This is likely due to recent treatment. The patient denies recent history of fevers, cough, chills, diarrhea or dysuria. She is asymptomatic from the leukopenia. I will observe for now.  I will continue the chemotherapy at current dose without dosage adjustment. 

## 2015-06-23 NOTE — Progress Notes (Signed)
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 Needle biopsy was inconclusive.     08/02/2011 Imaging CT scan of the chest abdomen and pelvis showed numerous lymphadenopathy above and below the diaphragm.    08/16/2011 Imaging MRI of the abdomen revealed enlarging left kidney lesion worrisome for malignancy   09/20/2011 Initial Diagnosis Lymphoma, low grade from kidney biopsy, follicular   6/38/9373 Procedure Histologic type: Non-Hodgkin's lymphoma, follicular center cell type. Grade (if applicable): Favor high grade, Ki-67 ranges from 10-50%. Immunohistochemical stains: CD20, CD79a, CD10, BCL-2, BCL-6, CD21, CD3, CD43.   05/09/2013 Procedure BM biopsy is highly suspicious of involvement. Patient had persistent leukopenia   05/09/2013 Imaging PET/CT showed significant involvement of LN everywhere   07/16/2013 - 11/05/2013 Chemotherapy She completed 6 cycles of R. CHOP chemotherapy.   07/23/2013 - 07/27/2013 Hospital Admission Admitted for management of mucositis and neutropenic fever. Blood culture was positive for gram negative rods, resolved with antibiotics   10/08/2013 Adverse Reaction The dose of  vincristine is reduced by 50%   12/15/2013 Imaging Repeat PET CT scan show complete response to treatment.   12/17/2013 -  Chemotherapy She begin maintenance rituximab every other month   02/23/2014 Procedure She had shave biopsy of the left upper, that came back well-differentiated squamous cell carcinoma.   04/29/2014 Imaging CT scan of the abdomen showed no evidence of disease recurrence.   10/12/2014 Imaging Repeat CT scan of the chest, abdomen and pelvis showed no evidence of recurrence.    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    INTERVAL HISTORY: Please see below for problem oriented charting. She is seen prior to cycle #10 of treatment. She feels well. No new lymphadenopathy. Denies recent infection. She continues to have occasional skin lesions and will see dermatologist this month. She has gained some weight recently.  REVIEW OF SYSTEMS:   Constitutional: Denies fevers, chills or abnormal weight loss Eyes: Denies blurriness of vision Ears, nose, mouth, throat, and face: Denies mucositis or sore throat Respiratory: Denies cough, dyspnea or wheezes Cardiovascular: Denies palpitation, chest discomfort or lower extremity swelling Gastrointestinal:  Denies nausea, heartburn or change in bowel habits Skin: Denies abnormal skin rashes Lymphatics: Denies new lymphadenopathy or easy bruising Neurological:Denies numbness, tingling or new weaknesses Behavioral/Psych: Mood is stable, no new changes  All other systems were reviewed with the patient and are negative.  I have reviewed the past medical history,  past surgical history, social history and family history with the patient and they are  unchanged from previous note.  ALLERGIES:  is allergic to latex.  MEDICATIONS:  Current Outpatient Prescriptions  Medication Sig Dispense Refill  . acetaminophen (TYLENOL) 500 MG tablet Take 250 mg by mouth every 6 (six) hours as needed for moderate pain.    Marland Kitchen aspirin 81 MG tablet Take 81 mg by mouth at bedtime.     . B Complex Vitamins (B-COMPLEX/B-12 PO) Take by mouth.    Marland Kitchen CALCIUM-MAGNESIUM-ZINC PO Take 1 tablet by mouth daily.     . Cholecalciferol (VITAMIN D) 2000 UNITS tablet Take 2,000 Units by mouth daily.    . Cyanocobalamin (VITAMIN B 12 PO) Take 1 tablet by mouth daily.    Marland Kitchen docusate sodium (COLACE) 100 MG capsule Take 1 capsule (100 mg total) by mouth 2 (two) times daily. (Patient taking differently: Take 100 mg by mouth daily as needed for mild constipation. ) 100 capsule 1  . loratadine (CLARITIN) 10 MG tablet Take 10 mg by mouth daily.    Marland Kitchen losartan-hydrochlorothiazide (HYZAAR) 100-12.5 MG per tablet     . Omega-3 Fatty Acids (FISH OIL) 1000 MG CAPS Take by mouth 2 (two) times daily.    . pantoprazole (PROTONIX) 40 MG tablet Take 40 mg by mouth daily.     . Probiotic Product (PROBIOTIC DAILY PO) Take 1 capsule by mouth daily.    Marland Kitchen pyridOXINE (VITAMIN B-6) 100 MG tablet Take 100 mg by mouth daily.     . Red Yeast Rice 600 MG CAPS Take 1 capsule by mouth every evening.      No current facility-administered medications for this visit.   Facility-Administered Medications Ordered in Other Visits  Medication Dose Route Frequency Provider Last Rate Last Dose  . 0.9 %  sodium chloride infusion   Intravenous Once Heath Lark, MD      . acetaminophen (TYLENOL) tablet 650 mg  650 mg Oral Once Heath Lark, MD      . diphenhydrAMINE (BENADRYL) capsule 50 mg  50 mg Oral Once Heath Lark, MD      . heparin lock flush 100 unit/mL  500 Units Intracatheter Once PRN Heath Lark, MD      . riTUXimab (RITUXAN) 700 mg in sodium chloride 0.9 % 180 mL chemo infusion  375 mg/m2 (Treatment Plan  Actual) Intravenous Once Heath Lark, MD      . sodium chloride 0.9 % injection 10 mL  10 mL Intracatheter PRN Heath Lark, MD        PHYSICAL EXAMINATION: ECOG PERFORMANCE STATUS: 0 - Asymptomatic  Filed Vitals:   06/23/15 0853  BP: 159/69  Pulse: 69  Temp: 98.1 F (36.7 C)  Resp: 18   Filed Weights   06/23/15 0853  Weight: 191 lb 8 oz (86.864 kg)   Body mass index is 35.02 kg/(m^2).  GENERAL:alert, no distress and comfortable. She is obese SKIN: skin color, texture, turgor are normal, no rashes or significant lesions EYES: normal, Conjunctiva are pink and non-injected, sclera clear OROPHARYNX:no exudate, no erythema and lips, buccal mucosa, and tongue normal  NECK: supple, thyroid normal size, non-tender, without nodularity LYMPH:  no palpable lymphadenopathy in the cervical, axillary or inguinal LUNGS: clear to auscultation and percussion with normal breathing effort HEART: regular rate & rhythm and no murmurs and no lower extremity edema ABDOMEN:abdomen soft, non-tender and normal bowel sounds Musculoskeletal:no cyanosis of digits and no clubbing  NEURO: alert & oriented x 3 with  fluent speech, no focal motor/sensory deficits  LABORATORY DATA:  I have reviewed the data as listed    Component Value Date/Time   NA 142 06/23/2015 0820   NA 139 09/10/2014 1012   K 3.6 06/23/2015 0820   K 3.5 09/10/2014 1012   CL 104 09/10/2014 1012   CO2 26 06/23/2015 0820   CO2 27 09/10/2014 1012   GLUCOSE 98 06/23/2015 0820   GLUCOSE 97 09/10/2014 1012   BUN 14.3 06/23/2015 0820   BUN 12 09/10/2014 1012   CREATININE 0.9 06/23/2015 0820   CREATININE 0.88 09/10/2014 1012   CALCIUM 9.4 06/23/2015 0820   CALCIUM 9.1 09/10/2014 1012   PROT 6.8 06/23/2015 0820   PROT 6.8 09/10/2014 1012   ALBUMIN 4.3 06/23/2015 0820   ALBUMIN 4.5 09/10/2014 1012   AST 30 06/23/2015 0820   AST 31 09/10/2014 1012   ALT 23 06/23/2015 0820   ALT 29 09/10/2014 1012   ALKPHOS 82 06/23/2015 0820    ALKPHOS 81 09/10/2014 1012   BILITOT 0.68 06/23/2015 0820   BILITOT 0.9 09/10/2014 1012   GFRNONAA 66* 09/10/2014 1012   GFRAA 76* 09/10/2014 1012    No results found for: SPEP, UPEP  Lab Results  Component Value Date   WBC 2.8* 06/23/2015   NEUTROABS 1.6 06/23/2015   HGB 13.0 06/23/2015   HCT 38.2 06/23/2015   MCV 82.6 06/23/2015   PLT 194 06/23/2015      Chemistry      Component Value Date/Time   NA 142 06/23/2015 0820   NA 139 09/10/2014 1012   K 3.6 06/23/2015 0820   K 3.5 09/10/2014 1012   CL 104 09/10/2014 1012   CO2 26 06/23/2015 0820   CO2 27 09/10/2014 1012   BUN 14.3 06/23/2015 0820   BUN 12 09/10/2014 1012   CREATININE 0.9 06/23/2015 0820   CREATININE 0.88 09/10/2014 1012      Component Value Date/Time   CALCIUM 9.4 06/23/2015 0820   CALCIUM 9.1 09/10/2014 1012   ALKPHOS 82 06/23/2015 0820   ALKPHOS 81 09/10/2014 1012   AST 30 06/23/2015 0820   AST 31 09/10/2014 1012   ALT 23 06/23/2015 0820   ALT 29 09/10/2014 1012   BILITOT 0.68 06/23/2015 0820   BILITOT 0.9 09/10/2014 1012     ASSESSMENT & PLAN:  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 Continue treatment as scheduled in October. Plan to stop treatment after February 2017 with repeat scan after that.  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   Essential hypertension, benign Her last blood pressure reading remain high. She is currently on medical management. I recommend weight loss exercise and reduce salt intake. Recommend she contacts her primary care doctor for medication adjustment.    Pancytopenia due to antineoplastic chemotherapy [facility]) This is likely due to recent treatment. The patient denies recent history of fevers, cough, chills, diarrhea or dysuria. She is asymptomatic from the leukopenia. I will observe for now.  I will  continue the chemotherapy at current dose without dosage adjustment.   Preventative health care We discussed the importance of preventive care and reviewed the vaccination programs. She does not have any prior allergic reactions to influenza vaccination. She agrees to proceed with influenza vaccination today and we will administer it today at the clinic.    No orders of the defined types  were placed in this encounter.   All questions were answered. The patient knows to call the clinic with any problems, questions or concerns. No barriers to learning was detected. I spent 25 minutes counseling the patient face to face. The total time spent in the appointment was 30 minutes and more than 50% was on counseling and review of test results     [facility], [place], MD 06/23/2015 9:42 AM

## 2015-06-23 NOTE — Telephone Encounter (Signed)
per pof to sch tp appt-gave pt copy of avs-sent MW email to sch pt trmt-pt aware

## 2015-06-23 NOTE — Patient Instructions (Signed)

## 2015-06-23 NOTE — Assessment & Plan Note (Signed)
Her last blood pressure reading remain high. She is currently on medical management. I recommend weight loss exercise and reduce salt intake. Recommend she contacts her primary care doctor for medication adjustment. 

## 2015-06-23 NOTE — Assessment & Plan Note (Signed)
She is doing well from the lymphoma standpoint. Continue treatment as scheduled in October. Plan to stop treatment after February 2017 with repeat scan after that.

## 2015-06-24 ENCOUNTER — Telehealth: Payer: Self-pay | Admitting: *Deleted

## 2015-06-24 NOTE — Telephone Encounter (Signed)
Per staff message and POF I have scheduled appts. Advised scheduler of appts. JMW  

## 2015-06-25 DIAGNOSIS — M79674 Pain in right toe(s): Secondary | ICD-10-CM | POA: Diagnosis not present

## 2015-06-25 DIAGNOSIS — B351 Tinea unguium: Secondary | ICD-10-CM | POA: Diagnosis not present

## 2015-07-05 DIAGNOSIS — Z0000 Encounter for general adult medical examination without abnormal findings: Secondary | ICD-10-CM | POA: Diagnosis not present

## 2015-07-08 DIAGNOSIS — L438 Other lichen planus: Secondary | ICD-10-CM | POA: Diagnosis not present

## 2015-07-08 DIAGNOSIS — L818 Other specified disorders of pigmentation: Secondary | ICD-10-CM | POA: Diagnosis not present

## 2015-07-27 ENCOUNTER — Ambulatory Visit (INDEPENDENT_AMBULATORY_CARE_PROVIDER_SITE_OTHER): Payer: Medicare Other | Admitting: Internal Medicine

## 2015-07-27 ENCOUNTER — Other Ambulatory Visit (INDEPENDENT_AMBULATORY_CARE_PROVIDER_SITE_OTHER): Payer: Self-pay | Admitting: Internal Medicine

## 2015-07-27 ENCOUNTER — Encounter (INDEPENDENT_AMBULATORY_CARE_PROVIDER_SITE_OTHER): Payer: Self-pay | Admitting: Internal Medicine

## 2015-07-27 VITALS — BP 130/82 | HR 72 | Temp 98.2°F | Ht 63.0 in | Wt 186.5 lb

## 2015-07-27 DIAGNOSIS — K219 Gastro-esophageal reflux disease without esophagitis: Secondary | ICD-10-CM | POA: Diagnosis not present

## 2015-07-27 DIAGNOSIS — K5909 Other constipation: Secondary | ICD-10-CM

## 2015-07-27 NOTE — Patient Instructions (Addendum)
Rx for Dexilant and samples given. Take Miralax that you have at home . 1 scoop a day.  PR in 2 weeks and let me know how you are doing.

## 2015-07-27 NOTE — Progress Notes (Signed)
Subjective:    Patient ID: [name], female    DOB: 10-13-1945, 69 y.o.   MRN: [id number]  HPI She presents today with c/o that her acid reflux is terrible. If she eats anything at night and lie down, she has acid reflux. She also takes fiber at night for her constipation. The fiber is not working likes she wants it too.  She says this weekend she had ulcers in her stomach and she knows the ulcers were coming from her stomach.  She says she could not take the Protonix. She says the Protonix hurt her stomach.  She has tried Nexium and Prevacid in the past. Her appetite is good. She has lost about 6 pounds since her visit in March. She has a BM x 1 a day and sometimes she will skip a day. No melena or BRRB   Diagnosed with Lymphoma in 2012. She does maintenance therapy every 8 weeks with Rituxan. She has two more tx.  She is in remission.    Echo 07/30/2014 EF 60%. Colonoscopy 02/11/2009: Dr. Laural Golden: Screening Colonoscopy: Hx of small polyp which was nonadenoma. Exam to cecum. Pancolonic diverticulosis. Most of the diverticula however are located at the sigmoid colon  Review of Systems Past Medical History  Diagnosis Date  . CAD (coronary artery disease)   . GERD (gastroesophageal reflux disease)   . HTN (hypertension)   . Hyperlipidemia   . Enlargement of lymph nodes 07/26/2011  . 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 Lymphoma ([place])   . Tick bite of knee   . Thyroid disease   . Follicular lymphoma ([place]) 06/27/2013  . 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  B-cell, favor follicular NHL.  CD79a, CD20 and CD10 positive.  Cyclin D1 negative.   . Mouth sores 07/18/2013  . Fever 07/23/2013  . Fungal infection 08/01/2013  . Hemorrhoids 08/05/2013  . Leukopenia 09/16/2013  . 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 Skull deformity 10/08/2013  .  Broken tooth 10/17/2013  . Infection of lip 10/27/2013  . UTI (urinary tract infection) 11/10/2013  . Dysuria 05/10/2015    Past Surgical History  Procedure Laterality Date  . Mr breast bilateral  cyst removal  . Abdominal hysterectomy    . Lymph node dissection  x 3 different times    behind left ear 2012/ left clavical area  . Tubal ligation    . Knee arthroscopy w/ debridement  05/2011    right  . Bone marrow biopsy  05/09/2013  . Bone marrow aspiration  05/09/2013    Allergies  Allergen Reactions  . Latex Swelling and Rash    Current Outpatient Prescriptions on File Prior to Visit  Medication Sig Dispense Refill  . acetaminophen (TYLENOL) 500 MG tablet Take 250 mg by mouth every 6 (six) hours as needed for moderate pain.    Marland Kitchen aspirin 81 MG tablet Take 81 mg by mouth at bedtime.     . B Complex Vitamins (B-COMPLEX/B-12 PO) Take by mouth.    Marland Kitchen CALCIUM-MAGNESIUM-ZINC PO Take 1 tablet by mouth daily.     . Cholecalciferol (VITAMIN D) 2000 UNITS tablet Take 2,000 Units by mouth daily.    . Cyanocobalamin (VITAMIN B 12 PO) Take 1 tablet by mouth daily.    Marland Kitchen docusate sodium (COLACE) 100 MG capsule Take 1 capsule (100 mg total) by mouth  2 (two) times daily. (Patient taking differently: Take 100 mg by mouth daily as needed for mild constipation. ) 100 capsule 1  . loratadine (CLARITIN) 10 MG tablet Take 10 mg by mouth daily.    Marland Kitchen losartan-hydrochlorothiazide (HYZAAR) 100-12.5 MG per tablet     . Omega-3 Fatty Acids (FISH OIL) 1000 MG CAPS Take by mouth 2 (two) times daily.    . pantoprazole (PROTONIX) 40 MG tablet Take 40 mg by mouth daily.     . Probiotic Product (PROBIOTIC DAILY PO) Take 1 capsule by mouth daily.    Marland Kitchen pyridOXINE (VITAMIN B-6) 100 MG tablet Take 100 mg by mouth daily.     . Red Yeast Rice 600 MG CAPS Take 1 capsule by mouth every evening.      No current facility-administered medications on file prior to visit.        Objective:   Physical Exam Blood pressure  130/82, pulse 72, temperature 98.2 F (36.8 C), height 5' 3"  (1.6 m), weight 186 lb 8 oz (84.596 kg). Alert and oriented. Skin warm and dry. Oral mucosa is moist.   . Sclera anicteric, conjunctivae is pink. Thyroid not enlarged. No cervical lymphadenopathy. Lungs clear. Heart regular rate and rhythm.  Abdomen is soft. Bowel sounds are positive. No hepatomegaly. No abdominal masses felt. No tenderness.  No edema to lower extremities.          Assessment & Plan:  GERD. Am going to try her on Dexilant and she how she does. She may also try Miralax for her constipation. PR in 2 weeks. If not better, may need EGD. OV in 6 months.

## 2015-07-28 DIAGNOSIS — M1711 Unilateral primary osteoarthritis, right knee: Secondary | ICD-10-CM | POA: Diagnosis not present

## 2015-07-29 ENCOUNTER — Ambulatory Visit: Payer: Medicare Other | Admitting: Cardiology

## 2015-08-02 ENCOUNTER — Ambulatory Visit (INDEPENDENT_AMBULATORY_CARE_PROVIDER_SITE_OTHER): Payer: Medicare Other | Admitting: Cardiology

## 2015-08-02 ENCOUNTER — Encounter: Payer: Self-pay | Admitting: Cardiology

## 2015-08-02 VITALS — BP 132/84 | HR 64 | Ht 62.0 in | Wt 182.5 lb

## 2015-08-02 DIAGNOSIS — R55 Syncope and collapse: Secondary | ICD-10-CM

## 2015-08-02 NOTE — Patient Instructions (Signed)
Your physician wants you to follow-up in: 1 Year. You will receive a reminder letter in the mail two months in advance. If you don't receive a letter, please call our office to schedule the follow-up appointment.  If you need a refill on your cardiac medications before your next appointment, please call your pharmacy.   

## 2015-08-02 NOTE — Progress Notes (Signed)
HPI The patient presents for followup of a known coronary anomaly. She has been followed for lymphoma.  She was treated with 6 cycles of R CHOP.  She did well with this and had a follow up echo which was normal. She has had syncope in the past but no recurrence of this since I saw her last. She is not describing any chest pressure, neck or arm discomfort. She's not having any palpitations, presyncope or syncope.  She has had no PND or orthopnea. She hasn't been exercising but she does vacuum without limitations.  She is limited by knee pain.   Allergies  Allergen Reactions  . Latex Swelling and Rash    Current Outpatient Prescriptions  Medication Sig Dispense Refill  . acetaminophen (TYLENOL) 500 MG tablet Take 250 mg by mouth every 6 (six) hours as needed for moderate pain.    Marland Kitchen aspirin 81 MG tablet Take 81 mg by mouth at bedtime.     . B Complex Vitamins (B-COMPLEX/B-12 PO) Take by mouth.    Marland Kitchen CALCIUM-MAGNESIUM-ZINC PO Take 1 tablet by mouth daily.     . Cholecalciferol (VITAMIN D) 2000 UNITS tablet Take 2,000 Units by mouth daily.    . Cyanocobalamin (VITAMIN B 12 PO) Take 1 tablet by mouth daily.    Marland Kitchen docusate sodium (COLACE) 100 MG capsule Take 1 capsule (100 mg total) by mouth 2 (two) times daily. (Patient taking differently: Take 100 mg by mouth daily as needed for mild constipation. ) 100 capsule 1  . loratadine (CLARITIN) 10 MG tablet Take 10 mg by mouth daily.    Marland Kitchen losartan-hydrochlorothiazide (HYZAAR) 100-12.5 MG per tablet     . Omega-3 Fatty Acids (FISH OIL) 1000 MG CAPS Take by mouth 2 (two) times daily.    . Probiotic Product (PROBIOTIC DAILY PO) Take 1 capsule by mouth daily.    Marland Kitchen pyridOXINE (VITAMIN B-6) 100 MG tablet Take 100 mg by mouth daily.     . Red Yeast Rice 600 MG CAPS Take 1 capsule by mouth every evening.      No current facility-administered medications for this visit.    Past Medical History  Diagnosis Date  . Congenital coronary artery anomaly   . GERD  (gastroesophageal reflux disease)   . HTN (hypertension)   . Hyperlipidemia   . Enlargement of lymph nodes 07/26/2011  . Thyroid disease   . Follicular lymphoma ([place]) 06/27/2013  . 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  B-cell, favor follicular NHL.  CD79a, CD20 and CD10 positive.  Cyclin D1 negative.   . Hemorrhoids 08/05/2013  . Leukopenia 09/16/2013  . 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 UTI (urinary tract infection) 11/10/2013  . Lichen planus     Past Surgical History  Procedure Laterality Date  . Mr breast bilateral  cyst removal  . Abdominal hysterectomy    . Lymph node dissection  x 3 different times    behind left ear 2012/ left clavical area  . Tubal ligation    . Knee arthroscopy w/ debridement  05/2011    right  . Bone marrow biopsy  05/09/2013  . Bone marrow aspiration  05/09/2013    ROS: Reflux. Otherwise as stated in the HPI and negative for all other systems.  PHYSICAL EXAM BP 132/84 mmHg  Pulse 64  Ht _0  (1.575 m)  Wt 182 lb 8 oz (82.781 kg)  BMI 33.37 kg/m2 GENERAL:  Well appearing NECK:  No jugular venous distention, waveform within normal limits, carotid upstroke brisk and symmetric, no bruits, no thyromegaly LUNGS:  Clear to auscultation bilaterally CHEST:  Unremarkable HEART:  PMI not displaced or sustained,S1 and S2 within normal limits, no S3, no S4, no clicks, no rubs, no murmurs ABD:  Flat, positive bowel sounds normal in frequency in pitch, no bruits, no rebound, no guarding, no midline pulsatile mass, no hepatomegaly, no splenomegaly EXT:  2 plus pulses throughout, no edema, no cyanosis no clubbing  EKG:    Sinus rhythm, rate 64, axis within normal limits, intervals within normal limits, no acute ST-T wave changes.  ASSESSMENT AND PLAN  CORONARY ARTERY ANOMALY, CONGENITAL She has no symptoms related to this. He did have a stress  perfusion study in 2011. No further evaluation is indicated.   ESSENTIAL HYPERTENSION, BENIGN The blood pressure is at target. No change in medications is indicated. We will continue with therapeutic lifestyle changes (TLC).  HYPERLIPIDEMIA She cannot tolerate prescription meds and will continue with red yeast rice.   SYNCOPE: She had no recurrence of this. This was previously thought to be vagal.  HIGH RISK MED:   She had follow up echo after her 6 courses of R CHOP and had a normal EF

## 2015-08-18 ENCOUNTER — Telehealth (INDEPENDENT_AMBULATORY_CARE_PROVIDER_SITE_OTHER): Payer: Self-pay | Admitting: *Deleted

## 2015-08-18 NOTE — Telephone Encounter (Signed)
She states she saw you 2 weeks ago or so and you gave her Dexilant to try.  This is also irritating stomach.  She had not taken every day because of that but only takes when the reflux is bad.  Can you please call her or does she need an appt?

## 2015-08-18 NOTE — Telephone Encounter (Signed)
I advised her today to take the Dexilant 30 minutes before supper. She will call me next seek and let me know how she is doing. I think she may need an EGD if not better.

## 2015-08-19 ENCOUNTER — Other Ambulatory Visit (HOSPITAL_BASED_OUTPATIENT_CLINIC_OR_DEPARTMENT_OTHER): Payer: Medicare Other

## 2015-08-19 ENCOUNTER — Ambulatory Visit (HOSPITAL_BASED_OUTPATIENT_CLINIC_OR_DEPARTMENT_OTHER): Payer: Medicare Other

## 2015-08-19 ENCOUNTER — Ambulatory Visit: Payer: Medicare Other

## 2015-08-19 ENCOUNTER — Encounter: Payer: Self-pay | Admitting: Hematology and Oncology

## 2015-08-19 ENCOUNTER — Ambulatory Visit (HOSPITAL_BASED_OUTPATIENT_CLINIC_OR_DEPARTMENT_OTHER): Payer: Medicare Other | Admitting: Hematology and Oncology

## 2015-08-19 ENCOUNTER — Telehealth: Payer: Self-pay | Admitting: Hematology and Oncology

## 2015-08-19 VITALS — BP 142/78 | HR 71 | Temp 98.1°F | Resp 18 | Ht 62.0 in | Wt 185.5 lb

## 2015-08-19 VITALS — BP 115/61 | HR 71 | Temp 97.9°F | Resp 16

## 2015-08-19 DIAGNOSIS — D72819 Decreased white blood cell count, unspecified: Secondary | ICD-10-CM

## 2015-08-19 DIAGNOSIS — C44729 Squamous cell carcinoma of skin of left lower limb, including hip: Secondary | ICD-10-CM

## 2015-08-19 DIAGNOSIS — C8239 Follicular lymphoma grade IIIa, extranodal and solid organ sites: Secondary | ICD-10-CM

## 2015-08-19 DIAGNOSIS — I10 Essential (primary) hypertension: Secondary | ICD-10-CM

## 2015-08-19 DIAGNOSIS — Z5112 Encounter for antineoplastic immunotherapy: Secondary | ICD-10-CM | POA: Diagnosis not present

## 2015-08-19 LAB — CBC WITH DIFFERENTIAL/PLATELET
BASO%: 1 % (ref 0.0–2.0)
BASOS ABS: 0 10*3/uL (ref 0.0–0.1)
EOS%: 3.6 % (ref 0.0–7.0)
Eosinophils Absolute: 0.1 10*3/uL (ref 0.0–0.5)
HEMATOCRIT: 35.5 % (ref 34.8–46.6)
HGB: 12.2 g/dL (ref 11.6–15.9)
LYMPH#: 0.6 10*3/uL — AB (ref 0.9–3.3)
LYMPH%: 20.9 % (ref 14.0–49.7)
MCH: 28.5 pg (ref 25.1–34.0)
MCHC: 34.4 g/dL (ref 31.5–36.0)
MCV: 82.9 fL (ref 79.5–101.0)
MONO#: 0.2 10*3/uL (ref 0.1–0.9)
MONO%: 7.5 % (ref 0.0–14.0)
NEUT#: 2.1 10*3/uL (ref 1.5–6.5)
NEUT%: 67 % (ref 38.4–76.8)
PLATELETS: 180 10*3/uL (ref 145–400)
RBC: 4.28 10*6/uL (ref 3.70–5.45)
RDW: 14.5 % (ref 11.2–14.5)
WBC: 3.1 10*3/uL — ABNORMAL LOW (ref 3.9–10.3)

## 2015-08-19 LAB — COMPREHENSIVE METABOLIC PANEL
ALT: 20 U/L (ref 0–55)
ANION GAP: 12 meq/L — AB (ref 3–11)
AST: 26 U/L (ref 5–34)
Albumin: 4 g/dL (ref 3.5–5.0)
Alkaline Phosphatase: 74 U/L (ref 40–150)
BUN: 14.7 mg/dL (ref 7.0–26.0)
CALCIUM: 9.3 mg/dL (ref 8.4–10.4)
CHLORIDE: 106 meq/L (ref 98–109)
CO2: 24 mEq/L (ref 22–29)
Creatinine: 1 mg/dL (ref 0.6–1.1)
EGFR: 67 mL/min/{1.73_m2} — AB (ref >90)
Glucose: 95 mg/dl (ref 70–140)
POTASSIUM: 3.5 meq/L (ref 3.5–5.1)
Sodium: 142 mEq/L (ref 136–145)
Total Bilirubin: 0.74 mg/dL (ref 0.20–1.20)
Total Protein: 6.6 g/dL (ref 6.4–8.3)

## 2015-08-19 MED ORDER — ACETAMINOPHEN 325 MG PO TABS
ORAL_TABLET | ORAL | Status: AC
  Filled 2015-08-19: qty 2

## 2015-08-19 MED ORDER — HEPARIN SOD (PORK) LOCK FLUSH 100 UNIT/ML IV SOLN
500.0000 [IU] | Freq: Once | INTRAVENOUS | Status: AC | PRN
  Administered 2015-08-19: 500 [IU]
  Filled 2015-08-19: qty 5

## 2015-08-19 MED ORDER — ACETAMINOPHEN 325 MG PO TABS
650.0000 mg | ORAL_TABLET | Freq: Once | ORAL | Status: AC
Start: 2015-08-19 — End: 2015-08-19
  Administered 2015-08-19: 650 mg via ORAL

## 2015-08-19 MED ORDER — DIPHENHYDRAMINE HCL 25 MG PO CAPS
ORAL_CAPSULE | ORAL | Status: AC
  Filled 2015-08-19: qty 2

## 2015-08-19 MED ORDER — SODIUM CHLORIDE 0.9 % IV SOLN
375.0000 mg/m2 | Freq: Once | INTRAVENOUS | Status: AC
  Administered 2015-08-19: 700 mg via INTRAVENOUS
  Filled 2015-08-19: qty 70

## 2015-08-19 MED ORDER — SODIUM CHLORIDE 0.9 % IV SOLN
Freq: Once | INTRAVENOUS | Status: AC
  Administered 2015-08-19: 09:00:00 via INTRAVENOUS

## 2015-08-19 MED ORDER — SODIUM CHLORIDE 0.9 % IJ SOLN
10.0000 mL | INTRAMUSCULAR | Status: DC | PRN
  Administered 2015-08-19: 10 mL
  Filled 2015-08-19: qty 10

## 2015-08-19 MED ORDER — DIPHENHYDRAMINE HCL 25 MG PO CAPS
50.0000 mg | ORAL_CAPSULE | Freq: Once | ORAL | Status: AC
  Administered 2015-08-19: 50 mg via ORAL

## 2015-08-19 NOTE — Progress Notes (Signed)
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 Needle biopsy was inconclusive.     08/02/2011 Imaging CT scan of the chest abdomen and pelvis showed numerous lymphadenopathy above and below the diaphragm.    08/16/2011 Imaging MRI of the abdomen revealed enlarging left kidney lesion worrisome for malignancy   09/20/2011 Initial Diagnosis Lymphoma, low grade from kidney biopsy, follicular   5/70/1779 Procedure Histologic type: Non-Hodgkin's lymphoma, follicular center cell type. Grade (if applicable): Favor high grade, Ki-67 ranges from 10-50%. Immunohistochemical stains: CD20, CD79a, CD10, BCL-2, BCL-6, CD21, CD3, CD43.   05/09/2013 Procedure BM biopsy is highly suspicious of involvement. Patient had persistent leukopenia   05/09/2013 Imaging PET/CT showed significant involvement of LN everywhere   07/16/2013 - 11/05/2013 Chemotherapy She completed 6 cycles of R. CHOP chemotherapy.   07/23/2013 - 07/27/2013 Hospital Admission Admitted for management of mucositis and neutropenic fever. Blood culture was positive for gram negative rods, resolved with antibiotics   10/08/2013 Adverse Reaction The dose of  vincristine is reduced by 50%   12/15/2013 Imaging Repeat PET CT scan show complete response to treatment.   12/17/2013 -  Chemotherapy She begin maintenance rituximab every other month   02/23/2014 Procedure She had shave biopsy of the left upper, that came back well-differentiated squamous cell carcinoma.   04/29/2014 Imaging CT scan of the abdomen showed no evidence of disease recurrence.   10/12/2014 Imaging Repeat CT scan of the chest, abdomen and pelvis showed no evidence of recurrence.    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    INTERVAL HISTORY: Please see below for problem oriented charting. She returns for cycle #11 of rituximab. She denies new lymphadenopathy. Denies recent infection. She had multiple Neal skin lesion on her left lower leg. She has not seen her dermatologist lately.  REVIEW OF SYSTEMS:   Constitutional: Denies fevers, chills or abnormal weight loss Eyes: Denies blurriness of vision Ears, nose, mouth, throat, and face: Denies mucositis or sore throat Respiratory: Denies cough, dyspnea or wheezes Cardiovascular: Denies palpitation, chest discomfort or lower extremity swelling Gastrointestinal:  Denies nausea, heartburn or change in bowel habits Lymphatics: Denies new lymphadenopathy or easy bruising Neurological:Denies numbness, tingling or new weaknesses Behavioral/Psych: Mood is stable, no new changes  All other systems were reviewed with the patient and are negative.  I have reviewed the past medical history, past surgical history, social history and family history with the  patient and they are unchanged from previous note.  ALLERGIES:  is allergic to  latex.  MEDICATIONS:  Current Outpatient Prescriptions  Medication Sig Dispense Refill  . acetaminophen (TYLENOL) 500 MG tablet Take 250 mg by mouth every 6 (six) hours as needed for moderate pain.    Marland Kitchen aspirin 81 MG tablet Take 81 mg by mouth at bedtime.     . B Complex Vitamins (B-COMPLEX/B-12 PO) Take by mouth.    Marland Kitchen CALCIUM-MAGNESIUM-ZINC PO Take 1 tablet by mouth daily.     . Cholecalciferol (VITAMIN D) 2000 UNITS tablet Take 2,000 Units by mouth daily.    . Cyanocobalamin (VITAMIN B 12 PO) Take 1 tablet by mouth daily.    Marland Kitchen dexlansoprazole (DEXILANT) 60 MG capsule Take 60 mg by mouth daily.    Marland Kitchen docusate sodium (COLACE) 100 MG capsule Take 1 capsule (100 mg total) by mouth 2 (two) times daily. (Patient taking differently: Take 100 mg by mouth daily as needed for mild constipation. ) 100 capsule 1  . loratadine (CLARITIN) 10 MG tablet Take 10 mg by mouth daily.    Marland Kitchen losartan-hydrochlorothiazide (HYZAAR) 100-12.5 MG per tablet     . Omega-3 Fatty Acids (FISH OIL) 1000 MG CAPS Take by mouth 2 (two) times daily.    . Probiotic Product (PROBIOTIC DAILY PO) Take 1 capsule by mouth daily.    Marland Kitchen pyridOXINE (VITAMIN B-6) 100 MG tablet Take 100 mg by mouth daily.     . Red Yeast Rice 600 MG CAPS Take 1 capsule by mouth every evening.      No current facility-administered medications for this visit.    PHYSICAL EXAMINATION: ECOG PERFORMANCE STATUS: 1 - Symptomatic but completely ambulatory  Filed Vitals:   08/19/15 0853  BP: 142/78  Pulse: 71  Temp: 98.1 F (36.7 C)  Resp: 18   Filed Weights   08/19/15 0853  Weight: 185 lb 8 oz (84.142 kg)    GENERAL:alert, no distress and comfortable SKIN: Noted multiple new skin lesions on the left lower leg. EYES: normal, Conjunctiva are pink and non-injected, sclera clear OROPHARYNX:no exudate, no erythema and lips, buccal mucosa, and tongue normal  NECK: supple, thyroid normal size, non-tender, without nodularity LYMPH:  no palpable  lymphadenopathy in the cervical, axillary or inguinal LUNGS: clear to auscultation and percussion with normal breathing effort HEART: regular rate & rhythm and no murmurs and no lower extremity edema ABDOMEN:abdomen soft, non-tender and normal bowel sounds Musculoskeletal:no cyanosis of digits and no clubbing  NEURO: alert & oriented x 3 with fluent speech, no focal motor/sensory deficits  LABORATORY DATA:  I have reviewed the data as listed    Component Value Date/Time   NA 142 08/19/2015 0805   NA 139 09/10/2014 1012   K 3.5 08/19/2015 0805   K 3.5 09/10/2014 1012   CL 104 09/10/2014 1012   CO2 24 08/19/2015 0805   CO2 27 09/10/2014 1012   GLUCOSE 95 08/19/2015 0805   GLUCOSE 97 09/10/2014 1012   BUN 14.7 08/19/2015 0805   BUN 12 09/10/2014 1012   CREATININE 1.0 08/19/2015 0805   CREATININE 0.88 09/10/2014 1012   CALCIUM 9.3 08/19/2015 0805   CALCIUM 9.1 09/10/2014 1012   PROT 6.6 08/19/2015 0805   PROT 6.8 09/10/2014 1012   ALBUMIN 4.0 08/19/2015 0805   ALBUMIN 4.5 09/10/2014 1012   AST 26 08/19/2015 0805   AST 31 09/10/2014 1012   ALT 20 08/19/2015 0805   ALT 29 09/10/2014 1012   ALKPHOS  74 08/19/2015 0805   ALKPHOS 81 09/10/2014 1012   BILITOT 0.74 08/19/2015 0805   BILITOT 0.9 09/10/2014 1012   GFRNONAA 66* 09/10/2014 1012   GFRAA 76* 09/10/2014 1012    No results found for: SPEP, UPEP  Lab Results  Component Value Date   WBC 3.1* 08/19/2015   NEUTROABS 2.1 08/19/2015   HGB 12.2 08/19/2015   HCT 35.5 08/19/2015   MCV 82.9 08/19/2015   PLT 180 08/19/2015      Chemistry      Component Value Date/Time   NA 142 08/19/2015 0805   NA 139 09/10/2014 1012   K 3.5 08/19/2015 0805   K 3.5 09/10/2014 1012   CL 104 09/10/2014 1012   CO2 24 08/19/2015 0805   CO2 27 09/10/2014 1012   BUN 14.7 08/19/2015 0805   BUN 12 09/10/2014 1012   CREATININE 1.0 08/19/2015 0805   CREATININE 0.88 09/10/2014 1012      Component Value Date/Time   CALCIUM 9.3 08/19/2015  0805   CALCIUM 9.1 09/10/2014 1012   ALKPHOS 74 08/19/2015 0805   ALKPHOS 81 09/10/2014 1012   AST 26 08/19/2015 0805   AST 31 09/10/2014 1012   ALT 20 08/19/2015 0805   ALT 29 09/10/2014 1012   BILITOT 0.74 08/19/2015 0805   BILITOT 0.9 09/10/2014 1012      ASSESSMENT & PLAN:  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 Continue treatment,. Plan to stop treatment after February 2017 with repeat scan around that time.    Essential hypertension, benign Her last blood pressure reading remain mildly high. She is currently on medical management. I recommend weight loss exercise and reduce salt intake. Recommend she contacts her primary care doctor for medication adjustment.    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 I recommend she return to dermatologist for further evaluation and treatment  Leukopenia This is likely due to recent treatment. The patient denies recent history of fevers, cough, chills, diarrhea or dysuria. She is asymptomatic from the leukopenia. I will observe for now.  I will continue the chemotherapy at current dose without dosage adjustment.  If the leukopenia gets progressive worse in the future, I might have to delay her treatment or adjust the chemotherapy dose.     Orders Placed This Encounter  Procedures  . CT Chest W Contrast    Standing Status: Future     Number of Occurrences:      Standing Expiration Date: 11/18/2016    Order Specific Question:  Reason for Exam (SYMPTOM  OR DIAGNOSIS REQUIRED)    Answer:  follicular lymphoma, exclude cancer recurrence    Order Specific Question:  Preferred imaging location?    Answer:  [facility]  . CT Abdomen Pelvis W Contrast    Standing Status: Future     Number of Occurrences:      Standing Expiration Date: 11/18/2016    Order Specific Question:  Reason for Exam (SYMPTOM  OR  DIAGNOSIS REQUIRED)    Answer:  follicular lymphoma, exclude cancer recurrence    Order Specific Question:  Preferred imaging location?    Answer:  [facility]   All questions were answered. The patient knows to call the clinic with any problems, questions or concerns. No barriers to learning was detected. I spent 25 minutes counseling the patient face to face. The total time  spent in the appointment was 30 minutes and more than 50% was on counseling and review of test results     [facility], [place], MD 08/19/2015 9:25 AM

## 2015-08-19 NOTE — Telephone Encounter (Signed)
Gave and printed appt sched and avs for pt for Feb 2017.....gv barium

## 2015-08-19 NOTE — Assessment & Plan Note (Signed)
She is doing well from the lymphoma standpoint. Continue treatment,. Plan to stop treatment after February 2017 with repeat scan around that time.

## 2015-08-19 NOTE — Assessment & Plan Note (Signed)
She has a few new skin lesions on the left leg. I recommend she return to dermatologist for further evaluation and treatment

## 2015-08-19 NOTE — Patient Instructions (Signed)
[facility] Discharge Instructions for Patients Receiving Chemotherapy  Today you received the following chemotherapy agents: Rituxan   To help prevent nausea and vomiting after your treatment, we encourage you to take your nausea medication as directed.    If you develop nausea and vomiting that is not controlled by your nausea medication, call the clinic.   BELOW ARE SYMPTOMS THAT SHOULD BE REPORTED IMMEDIATELY:  *FEVER GREATER THAN 100.5 F  *CHILLS WITH OR WITHOUT FEVER  NAUSEA AND VOMITING THAT IS NOT CONTROLLED WITH YOUR NAUSEA MEDICATION  *UNUSUAL SHORTNESS OF BREATH  *UNUSUAL BRUISING OR BLEEDING  TENDERNESS IN MOUTH AND THROAT WITH OR WITHOUT PRESENCE OF ULCERS  *URINARY PROBLEMS  *BOWEL PROBLEMS  UNUSUAL RASH Items with * indicate a potential emergency and should be followed up as soon as possible.  Feel free to call the clinic you have any questions or concerns. The clinic phone number is [phone].  Please show the CHEMO ALERT CARD at check-in to the Emergency Department and triage nurse.   

## 2015-08-19 NOTE — Assessment & Plan Note (Signed)
Her last blood pressure reading remain mildly high. She is currently on medical management. I recommend weight loss exercise and reduce salt intake. Recommend she contacts her primary care doctor for medication adjustment.

## 2015-08-19 NOTE — Assessment & Plan Note (Signed)
This is likely due to recent treatment. The patient denies recent history of fevers, cough, chills, diarrhea or dysuria. She is asymptomatic from the leukopenia. I will observe for now.  I will continue the chemotherapy at current dose without dosage adjustment.  If the leukopenia gets progressive worse in the future, I might have to delay her treatment or adjust the chemotherapy dose.   

## 2015-08-25 ENCOUNTER — Ambulatory Visit: Payer: Medicare Other

## 2015-08-25 ENCOUNTER — Ambulatory Visit: Payer: Medicare Other | Admitting: Hematology and Oncology

## 2015-08-25 ENCOUNTER — Other Ambulatory Visit: Payer: Medicare Other

## 2015-08-31 DIAGNOSIS — L308 Other specified dermatitis: Secondary | ICD-10-CM | POA: Diagnosis not present

## 2015-08-31 DIAGNOSIS — L905 Scar conditions and fibrosis of skin: Secondary | ICD-10-CM | POA: Diagnosis not present

## 2015-08-31 DIAGNOSIS — L812 Freckles: Secondary | ICD-10-CM | POA: Diagnosis not present

## 2015-09-29 DIAGNOSIS — K219 Gastro-esophageal reflux disease without esophagitis: Secondary | ICD-10-CM | POA: Diagnosis not present

## 2015-09-29 DIAGNOSIS — Z6833 Body mass index (BMI) 33.0-33.9, adult: Secondary | ICD-10-CM | POA: Diagnosis not present

## 2015-10-07 ENCOUNTER — Telehealth: Payer: Self-pay | Admitting: *Deleted

## 2015-10-07 NOTE — Telephone Encounter (Signed)
Pt states she continues to have acid reflux. She has been taking her Dexilant every 3 days instead of everyday due to reflux. Is supposed to take a pill today, but she is not going to...   Has been using probiotics, "Digest", and pepto bismol without much relief. Is in the process of finding a new GI MD. Wants to know what Dr Alvy Bimler wants her to do.  Next appt with Dr Alvy Bimler is 2/8. CT is 2/7

## 2015-10-07 NOTE — Telephone Encounter (Signed)
Tums would give quick relief or Maalox OTC

## 2015-10-07 NOTE — Telephone Encounter (Signed)
Dr Alvy Bimler recommends Tums or Maalox OTC for acid reflux

## 2015-10-19 ENCOUNTER — Other Ambulatory Visit (HOSPITAL_BASED_OUTPATIENT_CLINIC_OR_DEPARTMENT_OTHER): Payer: Medicare Other

## 2015-10-19 ENCOUNTER — Ambulatory Visit (HOSPITAL_BASED_OUTPATIENT_CLINIC_OR_DEPARTMENT_OTHER): Payer: Medicare Other

## 2015-10-19 ENCOUNTER — Ambulatory Visit (HOSPITAL_COMMUNITY)
Admission: RE | Admit: 2015-10-19 | Discharge: 2015-10-19 | Disposition: A | Discharge status: Home / Self Care | Payer: Medicare Other | Source: Ambulatory Visit | Attending: Hematology and Oncology | Admitting: Hematology and Oncology

## 2015-10-19 ENCOUNTER — Encounter (HOSPITAL_COMMUNITY): Payer: Self-pay

## 2015-10-19 DIAGNOSIS — K760 Fatty (change of) liver, not elsewhere classified: Secondary | ICD-10-CM | POA: Diagnosis not present

## 2015-10-19 DIAGNOSIS — C8239 Follicular lymphoma grade IIIa, extranodal and solid organ sites: Secondary | ICD-10-CM

## 2015-10-19 DIAGNOSIS — C8590 Non-Hodgkin lymphoma, unspecified, unspecified site: Secondary | ICD-10-CM | POA: Diagnosis not present

## 2015-10-19 DIAGNOSIS — Z452 Encounter for adjustment and management of vascular access device: Secondary | ICD-10-CM | POA: Diagnosis not present

## 2015-10-19 DIAGNOSIS — I700 Atherosclerosis of aorta: Secondary | ICD-10-CM | POA: Diagnosis not present

## 2015-10-19 DIAGNOSIS — Z95828 Presence of other vascular implants and grafts: Secondary | ICD-10-CM

## 2015-10-19 LAB — COMPREHENSIVE METABOLIC PANEL
ALT: 16 U/L (ref 0–55)
ANION GAP: 9 meq/L (ref 3–11)
AST: 23 U/L (ref 5–34)
Albumin: 3.8 g/dL (ref 3.5–5.0)
Alkaline Phosphatase: 69 U/L (ref 40–150)
BUN: 13.7 mg/dL (ref 7.0–26.0)
CHLORIDE: 106 meq/L (ref 98–109)
CO2: 26 meq/L (ref 22–29)
CREATININE: 0.9 mg/dL (ref 0.6–1.1)
Calcium: 9.2 mg/dL (ref 8.4–10.4)
EGFR: 75 mL/min/{1.73_m2} — ABNORMAL LOW (ref >90)
Glucose: 92 mg/dl (ref 70–140)
POTASSIUM: 3.9 meq/L (ref 3.5–5.1)
Sodium: 142 mEq/L (ref 136–145)
Total Bilirubin: 0.34 mg/dL (ref 0.20–1.20)
Total Protein: 6.8 g/dL (ref 6.4–8.3)

## 2015-10-19 LAB — CBC WITH DIFFERENTIAL/PLATELET
BASO%: 1.5 % (ref 0.0–2.0)
BASOS ABS: 0 10*3/uL (ref 0.0–0.1)
EOS%: 5.6 % (ref 0.0–7.0)
Eosinophils Absolute: 0.2 10*3/uL (ref 0.0–0.5)
HCT: 36.6 % (ref 34.8–46.6)
HGB: 12.3 g/dL (ref 11.6–15.9)
LYMPH%: 27.4 % (ref 14.0–49.7)
MCH: 27.8 pg (ref 25.1–34.0)
MCHC: 33.6 g/dL (ref 31.5–36.0)
MCV: 82.7 fL (ref 79.5–101.0)
MONO#: 0.3 10*3/uL (ref 0.1–0.9)
MONO%: 9.3 % (ref 0.0–14.0)
NEUT#: 1.7 10*3/uL (ref 1.5–6.5)
NEUT%: 56.2 % (ref 38.4–76.8)
Platelets: 201 10*3/uL (ref 145–400)
RBC: 4.42 10*6/uL (ref 3.70–5.45)
RDW: 14.8 % — ABNORMAL HIGH (ref 11.2–14.5)
WBC: 2.9 10*3/uL — ABNORMAL LOW (ref 3.9–10.3)
lymph#: 0.8 10*3/uL — ABNORMAL LOW (ref 0.9–3.3)

## 2015-10-19 MED ORDER — IOHEXOL 300 MG/ML  SOLN
100.0000 mL | Freq: Once | INTRAMUSCULAR | Status: AC | PRN
  Administered 2015-10-19: 100 mL via INTRAVENOUS

## 2015-10-19 MED ORDER — SODIUM CHLORIDE 0.9% FLUSH
10.0000 mL | INTRAVENOUS | Status: DC | PRN
Start: 2015-10-19 — End: 2015-10-19
  Administered 2015-10-19: 10 mL via INTRAVENOUS
  Filled 2015-10-19: qty 10

## 2015-10-19 NOTE — Patient Instructions (Signed)

## 2015-10-20 ENCOUNTER — Ambulatory Visit (HOSPITAL_BASED_OUTPATIENT_CLINIC_OR_DEPARTMENT_OTHER): Payer: Medicare Other | Admitting: Hematology and Oncology

## 2015-10-20 ENCOUNTER — Telehealth: Payer: Self-pay | Admitting: Hematology and Oncology

## 2015-10-20 ENCOUNTER — Encounter: Payer: Self-pay | Admitting: Hematology and Oncology

## 2015-10-20 ENCOUNTER — Ambulatory Visit (HOSPITAL_BASED_OUTPATIENT_CLINIC_OR_DEPARTMENT_OTHER): Payer: Medicare Other

## 2015-10-20 VITALS — BP 138/76 | HR 66 | Temp 98.1°F | Resp 17 | Wt 188.8 lb

## 2015-10-20 VITALS — BP 126/77 | HR 68 | Temp 98.0°F | Resp 18

## 2015-10-20 DIAGNOSIS — K760 Fatty (change of) liver, not elsewhere classified: Secondary | ICD-10-CM | POA: Diagnosis not present

## 2015-10-20 DIAGNOSIS — C44729 Squamous cell carcinoma of skin of left lower limb, including hip: Secondary | ICD-10-CM

## 2015-10-20 DIAGNOSIS — C8239 Follicular lymphoma grade IIIa, extranodal and solid organ sites: Secondary | ICD-10-CM

## 2015-10-20 DIAGNOSIS — N2889 Other specified disorders of kidney and ureter: Secondary | ICD-10-CM

## 2015-10-20 DIAGNOSIS — Z5112 Encounter for antineoplastic immunotherapy: Secondary | ICD-10-CM | POA: Diagnosis not present

## 2015-10-20 DIAGNOSIS — D72819 Decreased white blood cell count, unspecified: Secondary | ICD-10-CM | POA: Diagnosis not present

## 2015-10-20 HISTORY — DX: Fatty (change of) liver, not elsewhere classified: K76.0

## 2015-10-20 MED ORDER — SODIUM CHLORIDE 0.9 % IV SOLN
375.0000 mg/m2 | Freq: Once | INTRAVENOUS | Status: AC
  Administered 2015-10-20: 700 mg via INTRAVENOUS
  Filled 2015-10-20: qty 60

## 2015-10-20 MED ORDER — ACETAMINOPHEN 325 MG PO TABS
650.0000 mg | ORAL_TABLET | Freq: Once | ORAL | Status: AC
  Administered 2015-10-20: 650 mg via ORAL

## 2015-10-20 MED ORDER — ACETAMINOPHEN 325 MG PO TABS
ORAL_TABLET | ORAL | Status: AC
  Filled 2015-10-20: qty 2

## 2015-10-20 MED ORDER — DIPHENHYDRAMINE HCL 25 MG PO CAPS
ORAL_CAPSULE | ORAL | Status: AC
  Filled 2015-10-20: qty 2

## 2015-10-20 MED ORDER — SODIUM CHLORIDE 0.9 % IV SOLN
Freq: Once | INTRAVENOUS | Status: AC
  Administered 2015-10-20: 09:00:00 via INTRAVENOUS

## 2015-10-20 MED ORDER — DIPHENHYDRAMINE HCL 25 MG PO CAPS
50.0000 mg | ORAL_CAPSULE | Freq: Once | ORAL | Status: AC
  Administered 2015-10-20: 50 mg via ORAL

## 2015-10-20 MED ORDER — HEPARIN SOD (PORK) LOCK FLUSH 100 UNIT/ML IV SOLN
500.0000 [IU] | Freq: Once | INTRAVENOUS | Status: AC | PRN
  Administered 2015-10-20: 500 [IU]
  Filled 2015-10-20: qty 5

## 2015-10-20 MED ORDER — SODIUM CHLORIDE 0.9 % IJ SOLN
10.0000 mL | INTRAMUSCULAR | Status: DC | PRN
  Administered 2015-10-20: 10 mL
  Filled 2015-10-20: qty 10

## 2015-10-20 NOTE — Assessment & Plan Note (Signed)
She has persistent left kidney cyst. It is stable. Observe only.

## 2015-10-20 NOTE — Assessment & Plan Note (Signed)
CT scan showed complete response to treatment with no residual disease. She will complete her treatment with rituximab today. I recommend port removal in 2 weeks. I will see her back in 3 months with repeat history, physical examination and blood work

## 2015-10-20 NOTE — Assessment & Plan Note (Signed)
CT scan showed evidence of fatty liver congestion. Recommend weight loss and follow with primary care doctor to get her cholesterol levels checked and to begin treatment if needed

## 2015-10-20 NOTE — Assessment & Plan Note (Signed)
She has persistent skin lesions on both legs. I recommend she return to dermatologist for further evaluation and treatment

## 2015-10-20 NOTE — Assessment & Plan Note (Signed)
This is likely due to recent treatment. The patient denies recent history of fevers, cough, chills, diarrhea or dysuria. She is asymptomatic from the leukopenia. I will observe for now.  I will continue the chemotherapy at current dose without dosage adjustment.  If the leukopenia gets progressive worse in the future, I might have to delay her treatment or adjust the chemotherapy dose.   

## 2015-10-20 NOTE — Progress Notes (Signed)
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 Needle biopsy was inconclusive.     08/02/2011 Imaging CT scan of the chest abdomen and pelvis showed numerous lymphadenopathy above and below the diaphragm.    08/16/2011 Imaging MRI of the abdomen revealed enlarging left kidney lesion worrisome for malignancy   09/20/2011 Initial Diagnosis Lymphoma, low grade from kidney biopsy, follicular   09/29/1476 Procedure Histologic type: Non-Hodgkin's lymphoma, follicular center cell type. Grade (if applicable): Favor high grade, Ki-67 ranges from 10-50%. Immunohistochemical stains: CD20, CD79a, CD10, BCL-2, BCL-6, CD21, CD3, CD43.   05/09/2013 Procedure BM biopsy is highly suspicious of involvement. Patient had persistent leukopenia   05/09/2013 Imaging PET/CT showed significant involvement of LN everywhere   07/16/2013 - 11/05/2013 Chemotherapy She completed 6 cycles of R. CHOP chemotherapy.   07/23/2013 - 07/27/2013 Hospital Admission Admitted for management of mucositis and neutropenic fever. Blood culture was positive for gram negative rods, resolved with antibiotics   10/08/2013 Adverse Reaction The dose of  vincristine is reduced by 50%   12/15/2013 Imaging Repeat PET CT scan show complete response to treatment.   12/17/2013 -  Chemotherapy She begin maintenance rituximab every other month   02/23/2014 Procedure She had shave biopsy of the left upper, that came back well-differentiated squamous cell carcinoma.   04/29/2014 Imaging CT scan of the abdomen showed no evidence of disease recurrence.   10/12/2014 Imaging Repeat CT scan of the chest, abdomen and pelvis showed no evidence of recurrence.   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    INTERVAL HISTORY: Please see below for problem oriented charting. She feels well. Denies recent infection. No new lymphadenopathy. She had persistent skin lesions on both legs but they do not bother her.  REVIEW OF SYSTEMS:   Constitutional: Denies fevers, chills or abnormal weight loss Eyes: Denies blurriness of vision Ears, nose, mouth, throat, and face: Denies mucositis or sore throat Respiratory: Denies cough, dyspnea or wheezes Cardiovascular: Denies palpitation, chest discomfort or lower extremity swelling Gastrointestinal:  Denies nausea, heartburn or change in bowel habits Skin: Denies abnormal skin rashes Lymphatics: Denies new lymphadenopathy or easy bruising Neurological:Denies numbness, tingling or new weaknesses Behavioral/Psych: Mood is stable, no new changes  All other systems were reviewed with the patient and are negative.  I have reviewed the past medical history, past surgical history,  social history and family history with the patient and they are unchanged from previous  note.  ALLERGIES:  is allergic to latex.  MEDICATIONS:  Current Outpatient Prescriptions  Medication Sig Dispense Refill  . acetaminophen (TYLENOL) 500 MG tablet Take 250 mg by mouth every 6 (six) hours as needed for moderate pain.    Marland Kitchen aspirin 81 MG tablet Take 81 mg by mouth at bedtime.     . B Complex Vitamins (B-COMPLEX/B-12 PO) Take by mouth.    Marland Kitchen CALCIUM-MAGNESIUM-ZINC PO Take 1 tablet by mouth daily.     . Cholecalciferol (VITAMIN D) 2000 UNITS tablet Take 2,000 Units by mouth daily.    . Cyanocobalamin (VITAMIN B 12 PO) Take 1 tablet by mouth daily.    Marland Kitchen dexlansoprazole (DEXILANT) 60 MG capsule Take 60 mg by mouth daily.    Marland Kitchen docusate sodium (COLACE) 100 MG capsule Take 1 capsule (100 mg total) by mouth 2 (two) times daily. (Patient taking differently: Take 100 mg by mouth daily as needed for mild constipation. ) 100 capsule 1  . loratadine (CLARITIN) 10 MG tablet Take 10 mg by mouth daily.    Marland Kitchen losartan-hydrochlorothiazide (HYZAAR) 100-12.5 MG per tablet     . Omega-3 Fatty Acids (FISH OIL) 1000 MG CAPS Take by mouth 2 (two) times daily.    . Probiotic Product (PROBIOTIC DAILY PO) Take 1 capsule by mouth daily.    Marland Kitchen pyridOXINE (VITAMIN B-6) 100 MG tablet Take 100 mg by mouth daily.     . Red Yeast Rice 600 MG CAPS Take 1 capsule by mouth every evening.      No current facility-administered medications for this visit.    PHYSICAL EXAMINATION: ECOG PERFORMANCE STATUS: 0 - Asymptomatic  Filed Vitals:   10/20/15 0840  BP: 138/76  Pulse: 66  Temp: 98.1 F (36.7 C)  Resp: 17   Filed Weights   10/20/15 0840  Weight: 188 lb 12.8 oz (85.639 kg)    GENERAL:alert, no distress and comfortable. She is obese SKIN: She had persistent skin changes on both legs, suspicious for persistent squamous cell carcinoma  EYES: normal, Conjunctiva are pink and non-injected, sclera clear OROPHARYNX:no exudate, no erythema and lips, buccal mucosa, and tongue normal  NECK: supple, thyroid  normal size, non-tender, without nodularity LYMPH:  no palpable lymphadenopathy in the cervical, axillary or inguinal LUNGS: clear to auscultation and percussion with normal breathing effort HEART: regular rate & rhythm and no murmurs and no lower extremity edema ABDOMEN:abdomen soft, non-tender and normal bowel sounds Musculoskeletal:no cyanosis of digits and no clubbing  NEURO: alert & oriented x 3 with fluent speech, no focal motor/sensory deficits  LABORATORY DATA:  I have reviewed the data as listed    Component Value Date/Time   NA 142 10/19/2015 0756   NA 139 09/10/2014 1012   K 3.9 10/19/2015 0756   K 3.5 09/10/2014 1012   CL 104 09/10/2014 1012   CO2 26 10/19/2015 0756   CO2 27 09/10/2014 1012   GLUCOSE 92 10/19/2015 0756   GLUCOSE 97 09/10/2014 1012   BUN 13.7 10/19/2015 0756   BUN 12 09/10/2014 1012   CREATININE 0.9 10/19/2015 0756   CREATININE 0.88 09/10/2014 1012   CALCIUM 9.2 10/19/2015 0756   CALCIUM 9.1 09/10/2014 1012   PROT 6.8 10/19/2015 0756   PROT 6.8 09/10/2014 1012   ALBUMIN 3.8 10/19/2015 0756   ALBUMIN 4.5 09/10/2014 1012   AST 23 10/19/2015 0756   AST 31 09/10/2014 1012   ALT  16 10/19/2015 0756   ALT 29 09/10/2014 1012   ALKPHOS 69 10/19/2015 0756   ALKPHOS 81 09/10/2014 1012   BILITOT 0.34 10/19/2015 0756   BILITOT 0.9 09/10/2014 1012   GFRNONAA 66* 09/10/2014 1012   GFRAA 76* 09/10/2014 1012    No results found for: SPEP, UPEP  Lab Results  Component Value Date   WBC 2.9* 10/19/2015   NEUTROABS 1.7 10/19/2015   HGB 12.3 10/19/2015   HCT 36.6 10/19/2015   MCV 82.7 10/19/2015   PLT 201 10/19/2015      Chemistry      Component Value Date/Time   NA 142 10/19/2015 0756   NA 139 09/10/2014 1012   K 3.9 10/19/2015 0756   K 3.5 09/10/2014 1012   CL 104 09/10/2014 1012   CO2 26 10/19/2015 0756   CO2 27 09/10/2014 1012   BUN 13.7 10/19/2015 0756   BUN 12 09/10/2014 1012   CREATININE 0.9 10/19/2015 0756   CREATININE 0.88 09/10/2014  1012      Component Value Date/Time   CALCIUM 9.2 10/19/2015 0756   CALCIUM 9.1 09/10/2014 1012   ALKPHOS 69 10/19/2015 0756   ALKPHOS 81 09/10/2014 1012   AST 23 10/19/2015 0756   AST 31 09/10/2014 1012   ALT 16 10/19/2015 0756   ALT 29 09/10/2014 1012   BILITOT 0.34 10/19/2015 0756   BILITOT 0.9 09/10/2014 1012       RADIOGRAPHIC STUDIES: I have personally reviewed the radiological images as listed and agreed with the findings in the report. Ct Chest W Contrast  10/19/2015  CLINICAL DATA:  Restaging non-Hodgkin's lymphoma EXAM: CT CHEST, ABDOMEN, AND PELVIS WITH CONTRAST TECHNIQUE: Multidetector CT imaging of the chest, abdomen and pelvis was performed following the standard protocol during bolus administration of intravenous contrast. CONTRAST:  126m OMNIPAQUE IOHEXOL 300 MG/ML  SOLN COMPARISON:  None. FINDINGS: CT CHEST Mediastinum: The heart size is normal. No pericardial effusion identified. The trachea appears patent and is midline. Normal appearance of the esophagus. No mediastinal or hilar adenopathy identified. There is no axillary or supraclavicular adenopathy. Lungs/Pleura: No pleural fluid identified. No airspace consolidation. Stable 4 mm subpleural nodule in the left lower lobe. Musculoskeletal: No aggressive lytic or sclerotic bone lesions identified. Degenerative disc disease is present within the thoracic spine. CT ABDOMEN AND PELVIS Hepatobiliary: Low-attenuation structure within left lobe of liver measures 6 mm and is unchanged from previous exam. The gallbladder appears within normal limits. No biliary dilatation. Pancreas: Normal appearance of the pancreas. Spleen: The spleen appears normal. Adrenals/Urinary Tract: The adrenal glands are normal. Mild bilateral renal cortical scarring identified. Cyst within the midpole of left kidney measures 1.5 cm, image 51 of series 2. The urinary bladder appears normal. Stomach/Bowel: Calcified atherosclerotic disease involves the  abdominal aorta. No aneurysm. No enlarged retroperitoneal or mesenteric adenopathy. No enlarged pelvic or inguinal lymph nodes. Soft tissue stranding within the small bowel mesenteric is again noted, unchanged from previous exam. No pathologically enlarged mesenteric nodes. No pelvic or inguinal adenopathy. Vascular/Lymphatic: Calcified atherosclerotic disease involves the abdominal aorta. No aneurysm. Reproductive: S previous hysterectomy.  No adnexal mass. Other: There is a small fluid attenuating structure adjacent to the umbilicus measuring 1.5 cm, image 84 of series 2. Unchanged from previous exam. Musculoskeletal: Degenerative disc disease is identified within the lumbar spine. No aggressive lytic or sclerotic bone lesions. IMPRESSION: 1. No findings identified to suggest residual or recurrent lymphoma within the chest abdomen or pelvis. 2. Aortic atherosclerosis. 3. Hepatic steatosis. Electronically  Signed   By: Kerby Moors M.D.   On: 10/19/2015 10:10   Ct Abdomen Pelvis W Contrast  10/19/2015  CLINICAL DATA:  Restaging non-Hodgkin's lymphoma EXAM: CT CHEST, ABDOMEN, AND PELVIS WITH CONTRAST TECHNIQUE: Multidetector CT imaging of the chest, abdomen and pelvis was performed following the standard protocol during bolus administration of intravenous contrast. CONTRAST:  147m OMNIPAQUE IOHEXOL 300 MG/ML  SOLN COMPARISON:  None. FINDINGS: CT CHEST Mediastinum: The heart size is normal. No pericardial effusion identified. The trachea appears patent and is midline. Normal appearance of the esophagus. No mediastinal or hilar adenopathy identified. There is no axillary or supraclavicular adenopathy. Lungs/Pleura: No pleural fluid identified. No airspace consolidation. Stable 4 mm subpleural nodule in the left lower lobe. Musculoskeletal: No aggressive lytic or sclerotic bone lesions identified. Degenerative disc disease is present within the thoracic spine. CT ABDOMEN AND PELVIS Hepatobiliary: Low-attenuation  structure within left lobe of liver measures 6 mm and is unchanged from previous exam. The gallbladder appears within normal limits. No biliary dilatation. Pancreas: Normal appearance of the pancreas. Spleen: The spleen appears normal. Adrenals/Urinary Tract: The adrenal glands are normal. Mild bilateral renal cortical scarring identified. Cyst within the midpole of left kidney measures 1.5 cm, image 51 of series 2. The urinary bladder appears normal. Stomach/Bowel: Calcified atherosclerotic disease involves the abdominal aorta. No aneurysm. No enlarged retroperitoneal or mesenteric adenopathy. No enlarged pelvic or inguinal lymph nodes. Soft tissue stranding within the small bowel mesenteric is again noted, unchanged from previous exam. No pathologically enlarged mesenteric nodes. No pelvic or inguinal adenopathy. Vascular/Lymphatic: Calcified atherosclerotic disease involves the abdominal aorta. No aneurysm. Reproductive: S previous hysterectomy.  No adnexal mass. Other: There is a small fluid attenuating structure adjacent to the umbilicus measuring 1.5 cm, image 84 of series 2. Unchanged from previous exam. Musculoskeletal: Degenerative disc disease is identified within the lumbar spine. No aggressive lytic or sclerotic bone lesions. IMPRESSION: 1. No findings identified to suggest residual or recurrent lymphoma within the chest abdomen or pelvis. 2. Aortic atherosclerosis. 3. Hepatic steatosis. Electronically Signed   By: TKerby MoorsM.D.   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 She will complete her treatment with rituximab today. I recommend port removal in 2 weeks. I will see her back in 3 months with repeat history, physical examination and blood work  Leukopenia This is likely due to recent treatment. The patient denies recent history of fevers,  cough, chills, diarrhea or dysuria. She is asymptomatic from the leukopenia. I will observe for now.  I will continue the chemotherapy at current dose without dosage adjustment.  If the leukopenia gets progressive worse in the future, I might have to delay her treatment or adjust the chemotherapy dose.    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 I recommend she return to dermatologist for further evaluation and treatment    Hepatic steatosis CT scan showed evidence of fatty liver congestion. Recommend weight loss and follow with primary care doctor to get her cholesterol levels checked and to begin treatment if needed  Renal mass, left She has persistent left kidney cyst. It is stable. Observe only.   Orders Placed This Encounter  Procedures  . IR Removal Tun Access W/ Port W/O FL    Standing Status: Future     Number of Occurrences:  Standing Expiration Date: 12/17/2016    Order Specific Question:  Reason for exam:    Answer:  non-urgent, patient prefers wednesday but not 2/15. Port can be removed, done with chemo    Order Specific Question:  Preferred Imaging Location?    Answer:  [facility]   All questions were answered. The patient knows to call the clinic with any problems, questions or concerns. No barriers to learning was detected. I spent 25 minutes counseling the patient face to face. The total time spent in the appointment was 30 minutes and more than 50% was on counseling and review of test results     [facility], [place], MD 10/20/2015 8:59 AM

## 2015-10-20 NOTE — Patient Instructions (Signed)
[facility] Discharge Instructions for Patients Receiving Chemotherapy  Today you received the following chemotherapy agents: Rituxan   To help prevent nausea and vomiting after your treatment, we encourage you to take your nausea medication as directed.    If you develop nausea and vomiting that is not controlled by your nausea medication, call the clinic.   BELOW ARE SYMPTOMS THAT SHOULD BE REPORTED IMMEDIATELY:  *FEVER GREATER THAN 100.5 F  *CHILLS WITH OR WITHOUT FEVER  NAUSEA AND VOMITING THAT IS NOT CONTROLLED WITH YOUR NAUSEA MEDICATION  *UNUSUAL SHORTNESS OF BREATH  *UNUSUAL BRUISING OR BLEEDING  TENDERNESS IN MOUTH AND THROAT WITH OR WITHOUT PRESENCE OF ULCERS  *URINARY PROBLEMS  *BOWEL PROBLEMS  UNUSUAL RASH Items with * indicate a potential emergency and should be followed up as soon as possible.  Feel free to call the clinic you have any questions or concerns. The clinic phone number is [phone].  Please show the CHEMO ALERT CARD at check-in to the Emergency Department and triage nurse.   

## 2015-10-20 NOTE — Telephone Encounter (Signed)
per pof to sch pt appt-gave pt copy of avs °

## 2015-10-21 ENCOUNTER — Other Ambulatory Visit: Payer: Self-pay | Admitting: Hematology and Oncology

## 2015-11-02 ENCOUNTER — Other Ambulatory Visit: Payer: Self-pay | Admitting: General Surgery

## 2015-11-03 ENCOUNTER — Ambulatory Visit (HOSPITAL_COMMUNITY)
Admission: RE | Admit: 2015-11-03 | Discharge: 2015-11-03 | Disposition: A | Discharge status: Home / Self Care | Payer: Medicare Other | Source: Ambulatory Visit | Attending: Hematology and Oncology | Admitting: Hematology and Oncology

## 2015-11-03 ENCOUNTER — Encounter (HOSPITAL_COMMUNITY): Payer: Self-pay

## 2015-11-03 DIAGNOSIS — E079 Disorder of thyroid, unspecified: Secondary | ICD-10-CM | POA: Insufficient documentation

## 2015-11-03 DIAGNOSIS — Z7982 Long term (current) use of aspirin: Secondary | ICD-10-CM | POA: Insufficient documentation

## 2015-11-03 DIAGNOSIS — Z808 Family history of malignant neoplasm of other organs or systems: Secondary | ICD-10-CM | POA: Insufficient documentation

## 2015-11-03 DIAGNOSIS — Z803 Family history of malignant neoplasm of breast: Secondary | ICD-10-CM | POA: Insufficient documentation

## 2015-11-03 DIAGNOSIS — I10 Essential (primary) hypertension: Secondary | ICD-10-CM | POA: Insufficient documentation

## 2015-11-03 DIAGNOSIS — Z8249 Family history of ischemic heart disease and other diseases of the circulatory system: Secondary | ICD-10-CM | POA: Insufficient documentation

## 2015-11-03 DIAGNOSIS — L439 Lichen planus, unspecified: Secondary | ICD-10-CM | POA: Insufficient documentation

## 2015-11-03 DIAGNOSIS — E785 Hyperlipidemia, unspecified: Secondary | ICD-10-CM | POA: Insufficient documentation

## 2015-11-03 DIAGNOSIS — C8239 Follicular lymphoma grade IIIa, extranodal and solid organ sites: Secondary | ICD-10-CM

## 2015-11-03 DIAGNOSIS — Z8579 Personal history of other malignant neoplasms of lymphoid, hematopoietic and related tissues: Secondary | ICD-10-CM | POA: Insufficient documentation

## 2015-11-03 DIAGNOSIS — D630 Anemia in neoplastic disease: Secondary | ICD-10-CM | POA: Diagnosis not present

## 2015-11-03 DIAGNOSIS — Z452 Encounter for adjustment and management of vascular access device: Secondary | ICD-10-CM | POA: Diagnosis not present

## 2015-11-03 DIAGNOSIS — K219 Gastro-esophageal reflux disease without esophagitis: Secondary | ICD-10-CM | POA: Insufficient documentation

## 2015-11-03 LAB — CBC
HCT: 37.1 % (ref 36.0–46.0)
Hemoglobin: 12.4 g/dL (ref 12.0–15.0)
MCH: 28.1 pg (ref 26.0–34.0)
MCHC: 33.4 g/dL (ref 30.0–36.0)
MCV: 84.1 fL (ref 78.0–100.0)
PLATELETS: 207 10*3/uL (ref 150–400)
RBC: 4.41 MIL/uL (ref 3.87–5.11)
RDW: 14.3 % (ref 11.5–15.5)
WBC: 2.7 10*3/uL — ABNORMAL LOW (ref 4.0–10.5)

## 2015-11-03 LAB — PROTIME-INR
INR: 1.01 (ref 0.00–1.49)
PROTHROMBIN TIME: 13.1 s (ref 11.6–15.2)

## 2015-11-03 LAB — APTT: aPTT: 32 seconds (ref 24–37)

## 2015-11-03 MED ORDER — CEFAZOLIN SODIUM-DEXTROSE 2-3 GM-% IV SOLR
INTRAVENOUS | Status: AC
  Filled 2015-11-03: qty 50

## 2015-11-03 MED ORDER — MIDAZOLAM HCL 2 MG/2ML IJ SOLN
INTRAMUSCULAR | Status: AC | PRN
  Administered 2015-11-03: 0.5 mg via INTRAVENOUS
  Administered 2015-11-03: 1 mg via INTRAVENOUS
  Administered 2015-11-03: 0.5 mg via INTRAVENOUS

## 2015-11-03 MED ORDER — FENTANYL CITRATE (PF) 100 MCG/2ML IJ SOLN
INTRAMUSCULAR | Status: AC | PRN
  Administered 2015-11-03: 50 ug via INTRAVENOUS

## 2015-11-03 MED ORDER — LIDOCAINE HCL 1 % IJ SOLN
INTRAMUSCULAR | Status: AC
  Filled 2015-11-03: qty 20

## 2015-11-03 MED ORDER — MIDAZOLAM HCL 2 MG/2ML IJ SOLN
INTRAMUSCULAR | Status: AC
  Filled 2015-11-03: qty 4

## 2015-11-03 MED ORDER — SODIUM CHLORIDE 0.9 % IV SOLN
INTRAVENOUS | Status: DC
  Administered 2015-11-03: 10:00:00 via INTRAVENOUS

## 2015-11-03 MED ORDER — FENTANYL CITRATE (PF) 100 MCG/2ML IJ SOLN
INTRAMUSCULAR | Status: AC
  Filled 2015-11-03: qty 2

## 2015-11-03 MED ORDER — CEFAZOLIN SODIUM-DEXTROSE 2-3 GM-% IV SOLR
2.0000 g | Freq: Once | INTRAVENOUS | Status: AC
  Administered 2015-11-03: 2 g via INTRAVENOUS

## 2015-11-03 NOTE — Discharge Instructions (Signed)
Incision Care °An incision is when a surgeon cuts into your body. After surgery, the incision needs to be cared for properly to prevent infection.  °HOW TO CARE FOR YOUR INCISION °· Take medicines only as directed by your health care provider. °· There are many different ways to close and cover an incision, including stitches, skin glue, and adhesive strips. Follow your health care provider's instructions on: °¨ Incision care. °¨ Bandage (dressing) changes and removal. °¨ Incision closure removal. °· Do not take baths, swim, or use a hot tub until your health care provider approves. You may shower as directed by your health care provider. °· Resume your normal diet and activities as directed. °· Use anti-itch medicine (such as an antihistamine) as directed by your health care provider. The incision may itch while it is healing. Do not pick or scratch at the incision. °· Drink enough fluid to keep your urine clear or pale yellow. °SEEK MEDICAL CARE IF:  °· You have drainage, redness, swelling, or pain at your incision site. °· You have muscle aches, chills, or a general ill feeling. °· You notice a bad smell coming from the incision or dressing. °· Your incision edges separate after the sutures, staples, or skin adhesive strips have been removed. °· You have persistent nausea or vomiting. °· You have a fever. °· You are dizzy. °SEEK IMMEDIATE MEDICAL CARE IF:  °· You have a rash. °· You faint. °· You have difficulty breathing. °MAKE SURE YOU:  °· Understand these instructions. °· Will watch your condition. °· Will get help right away if you are not doing well or get worse. °  °This information is not intended to replace advice given to you by your health care provider. Make sure you discuss any questions you have with your health care provider. °  °Document Released: 03/17/2005 Document Revised: 09/18/2014 Document Reviewed: 10/22/2013 °Elsevier Interactive Patient Education ©2016 [employer] ° ° °Moderate  Conscious Sedation, Adult, Care After °Refer to this sheet in the next few weeks. These instructions provide you with information on caring for yourself after your procedure. Your health care provider may also give you more specific instructions. Your treatment has been planned according to current medical practices, but problems sometimes occur. Call your health care provider if you have any problems or questions after your procedure. °WHAT TO EXPECT AFTER THE PROCEDURE  °After your procedure: °· You may feel sleepy, clumsy, and have poor balance for several hours. °· Vomiting may occur if you eat too soon after the procedure. °HOME CARE INSTRUCTIONS °· Do not participate in any activities where you could become injured for at least 24 hours. Do not: °¨ Drive. °¨ Swim. °¨ Ride a bicycle. °¨ Operate heavy machinery. °¨ Cook. °¨ Use power tools. °¨ Climb ladders. °¨ Work from a high place. °· Do not make important decisions or sign legal documents until you are improved. °· If you vomit, drink water, juice, or soup when you can drink without vomiting. Make sure you have little or no nausea before eating solid foods. °· Only take over-the-counter or prescription medicines for pain, discomfort, or fever as directed by your health care provider. °· Make sure you and your family fully understand everything about the medicines given to you, including what side effects may occur. °· You should not drink alcohol, take sleeping pills, or take medicines that cause drowsiness for at least 24 hours. °· If you smoke, do not smoke without supervision. °· If you are feeling   better, you may resume normal activities 24 hours after you were sedated. °· Keep all appointments with your health care provider. °SEEK MEDICAL CARE IF: °· Your skin is pale or bluish in color. °· You continue to feel nauseous or vomit. °· Your pain is getting worse and is not helped by medicine. °· You have bleeding or swelling. °· You are still sleepy or  feeling clumsy after 24 hours. °SEEK IMMEDIATE MEDICAL CARE IF: °· You develop a rash. °· You have difficulty breathing. °· You develop any type of allergic problem. °· You have a fever. °MAKE SURE YOU: °· Understand these instructions. °· Will watch your condition. °· Will get help right away if you are not doing well or get worse. °  °This information is not intended to replace advice given to you by your health care provider. Make sure you discuss any questions you have with your health care provider. °  °Document Released: 06/18/2013 Document Revised: 09/18/2014 Document Reviewed: 06/18/2013 °Elsevier Interactive Patient Education ©2016 [employer] ° °

## 2015-11-03 NOTE — Sedation Documentation (Signed)
Patient denies pain and is resting comfortably.  

## 2015-11-03 NOTE — H&P (Signed)
Chief Complaint: Patient was seen in consultation today for port removal at the request of [place]  Referring Physician(s): Gorsuch,Ni  History of Present Illness: [name] is a 70 y.o. female with hx of Lymphoma. Had port placed in 2014 and has had no issues with it. Has had complete response to treatment with no residual disease and is now scheduled for port removal. PMHx, meds, labs, allergies reviewed. Has been NPO this am. No recent fevers, chills, illness  Past Medical History  Diagnosis Date  . Congenital coronary artery anomaly   . GERD (gastroesophageal reflux disease)   . HTN (hypertension)   . Hyperlipidemia   . Enlargement of lymph nodes 07/26/2011  . Thyroid disease   . Follicular lymphoma ([place]) 06/27/2013  . 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  B-cell, favor follicular NHL.  CD79a, CD20 and CD10 positive.  Cyclin D1 negative.   . Hemorrhoids 08/05/2013  . Leukopenia 09/16/2013  . 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 UTI (urinary tract infection) 11/10/2013  . Lichen planus     Past Surgical History  Procedure Laterality Date  . Mr breast bilateral  cyst removal  . Abdominal hysterectomy    . Lymph node dissection  x 3 different times    behind left ear 2012/ left clavical area  . Tubal ligation    . Knee arthroscopy w/ debridement  05/2011    right  . Bone marrow biopsy  05/09/2013  . Bone marrow aspiration  05/09/2013    Allergies: Latex  Medications: Prior to Admission medications   Medication Sig Start Date End Date Taking? Authorizing Provider  acetaminophen (TYLENOL) 500 MG tablet Take 250 mg by mouth every 6 (six) hours as needed for moderate pain.   Yes Historical Provider, MD  aspirin 81 MG tablet Take 81 mg by mouth at bedtime.    Yes Historical Provider, MD  B Complex Vitamins (B-COMPLEX/B-12 PO) Take by mouth.   Yes  Historical Provider, MD  CALCIUM-MAGNESIUM-ZINC PO Take 1 tablet by mouth daily.    Yes Historical Provider, MD  Cholecalciferol (VITAMIN D) 2000 UNITS tablet Take 2,000 Units by mouth daily.   Yes Historical Provider, MD  Cyanocobalamin (VITAMIN B 12 PO) Take 1 tablet by mouth daily.   Yes Historical Provider, MD  dexlansoprazole (DEXILANT) 60 MG capsule Take 60 mg by mouth daily.   Yes Historical Provider, MD  loratadine (CLARITIN) 10 MG tablet Take 10 mg by mouth daily.   Yes Historical Provider, MD  losartan-hydrochlorothiazide Konrad Penta) 100-12.5 MG per tablet  10/03/13  Yes Historical Provider, MD  Omega-3 Fatty Acids (FISH OIL) 1000 MG CAPS Take by mouth 2 (two) times daily.   Yes Historical Provider, MD  Probiotic Product (PROBIOTIC DAILY PO) Take 1 capsule by mouth daily.   Yes Historical Provider, MD  pyridOXINE (VITAMIN B-6) 100 MG tablet Take 100 mg by mouth daily.    Yes Historical Provider, MD  Red Yeast Rice 600 MG CAPS Take 1 capsule by mouth every evening.    Yes Historical Provider, MD  docusate sodium (COLACE) 100 MG capsule Take 1 capsule (100 mg total) by mouth 2 (two) times daily. Patient taking differently: Take 100 mg by mouth daily as needed for mild constipation.  08/05/13   Heath Lark, MD     Family History  Problem Relation Age of Onset  . Cancer Mother  breast  . Stroke Mother   . Cancer Father     pancreatic cancer  . Heart disease Father   . Cancer Brother     kidney cancer/brain tumor  . Heart disease Brother   . Coronary artery disease Brother     Social History   Social History  . Marital Status: Married    Spouse Name: N/A  . Number of Children: N/A  . Years of Education: N/A   Occupational History  . [employer]    Social History Main Topics  . Smoking status: Never Smoker   . Smokeless tobacco: Never Used  . Alcohol Use: No  . Drug Use: No  . Sexual Activity: No   Other Topics Concern  . None   Social History Narrative      Review of Systems: A 12 point ROS discussed and pertinent positives are indicated in the HPI above.  All other systems are negative.  Review of Systems  Vital Signs: BP 125/82 mmHg  Pulse 67  Temp(Src) 97.7 F (36.5 C) (Oral)  Resp 16  SpO2 100%  Physical Exam  Constitutional: She is oriented to person, place, and time. She appears well-developed and well-nourished. No distress.  HENT:  Head: Normocephalic.  Mouth/Throat: Oropharynx is clear and moist.  Neck: Normal range of motion. No tracheal deviation present.  Cardiovascular: Normal rate, regular rhythm and normal heart sounds.   Pulmonary/Chest: Effort normal and breath sounds normal. No respiratory distress.  (R)chest port palpable  Neurological: She is alert and oriented to person, place, and time.  Psychiatric: She has a normal mood and affect. Judgment normal.    Mallampati Score:  MD Evaluation Airway: WNL Heart: WNL Abdomen: WNL Chest/ Lungs: WNL ASA  Classification: 2 Mallampati/Airway Score: One  Imaging: Ct Chest W Contrast  10/19/2015  CLINICAL DATA:  Restaging non-Hodgkin's lymphoma EXAM: CT CHEST, ABDOMEN, AND PELVIS WITH CONTRAST TECHNIQUE: Multidetector CT imaging of the chest, abdomen and pelvis was performed following the standard protocol during bolus administration of intravenous contrast. CONTRAST:  149m OMNIPAQUE IOHEXOL 300 MG/ML  SOLN COMPARISON:  None. FINDINGS: CT CHEST Mediastinum: The heart size is normal. No pericardial effusion identified. The trachea appears patent and is midline. Normal appearance of the esophagus. No mediastinal or hilar adenopathy identified. There is no axillary or supraclavicular adenopathy. Lungs/Pleura: No pleural fluid identified. No airspace consolidation. Stable 4 mm subpleural nodule in the left lower lobe. Musculoskeletal: No aggressive lytic or sclerotic bone lesions identified. Degenerative disc disease is present within the thoracic spine. CT ABDOMEN AND  PELVIS Hepatobiliary: Low-attenuation structure within left lobe of liver measures 6 mm and is unchanged from previous exam. The gallbladder appears within normal limits. No biliary dilatation. Pancreas: Normal appearance of the pancreas. Spleen: The spleen appears normal. Adrenals/Urinary Tract: The adrenal glands are normal. Mild bilateral renal cortical scarring identified. Cyst within the midpole of left kidney measures 1.5 cm, image 51 of series 2. The urinary bladder appears normal. Stomach/Bowel: Calcified atherosclerotic disease involves the abdominal aorta. No aneurysm. No enlarged retroperitoneal or mesenteric adenopathy. No enlarged pelvic or inguinal lymph nodes. Soft tissue stranding within the small bowel mesenteric is again noted, unchanged from previous exam. No pathologically enlarged mesenteric nodes. No pelvic or inguinal adenopathy. Vascular/Lymphatic: Calcified atherosclerotic disease involves the abdominal aorta. No aneurysm. Reproductive: S previous hysterectomy.  No adnexal mass. Other: There is a small fluid attenuating structure adjacent to the umbilicus measuring 1.5 cm, image 84 of series 2. Unchanged from previous exam. Musculoskeletal: Degenerative  disc disease is identified within the lumbar spine. No aggressive lytic or sclerotic bone lesions. IMPRESSION: 1. No findings identified to suggest residual or recurrent lymphoma within the chest abdomen or pelvis. 2. Aortic atherosclerosis. 3. Hepatic steatosis. Electronically Signed   By: Kerby Moors M.D.   On: 10/19/2015 10:10   Ct Abdomen Pelvis W Contrast  10/19/2015  CLINICAL DATA:  Restaging non-Hodgkin's lymphoma EXAM: CT CHEST, ABDOMEN, AND PELVIS WITH CONTRAST TECHNIQUE: Multidetector CT imaging of the chest, abdomen and pelvis was performed following the standard protocol during bolus administration of intravenous contrast. CONTRAST:  176m OMNIPAQUE IOHEXOL 300 MG/ML  SOLN COMPARISON:  None. FINDINGS: CT CHEST Mediastinum:  The heart size is normal. No pericardial effusion identified. The trachea appears patent and is midline. Normal appearance of the esophagus. No mediastinal or hilar adenopathy identified. There is no axillary or supraclavicular adenopathy. Lungs/Pleura: No pleural fluid identified. No airspace consolidation. Stable 4 mm subpleural nodule in the left lower lobe. Musculoskeletal: No aggressive lytic or sclerotic bone lesions identified. Degenerative disc disease is present within the thoracic spine. CT ABDOMEN AND PELVIS Hepatobiliary: Low-attenuation structure within left lobe of liver measures 6 mm and is unchanged from previous exam. The gallbladder appears within normal limits. No biliary dilatation. Pancreas: Normal appearance of the pancreas. Spleen: The spleen appears normal. Adrenals/Urinary Tract: The adrenal glands are normal. Mild bilateral renal cortical scarring identified. Cyst within the midpole of left kidney measures 1.5 cm, image 51 of series 2. The urinary bladder appears normal. Stomach/Bowel: Calcified atherosclerotic disease involves the abdominal aorta. No aneurysm. No enlarged retroperitoneal or mesenteric adenopathy. No enlarged pelvic or inguinal lymph nodes. Soft tissue stranding within the small bowel mesenteric is again noted, unchanged from previous exam. No pathologically enlarged mesenteric nodes. No pelvic or inguinal adenopathy. Vascular/Lymphatic: Calcified atherosclerotic disease involves the abdominal aorta. No aneurysm. Reproductive: S previous hysterectomy.  No adnexal mass. Other: There is a small fluid attenuating structure adjacent to the umbilicus measuring 1.5 cm, image 84 of series 2. Unchanged from previous exam. Musculoskeletal: Degenerative disc disease is identified within the lumbar spine. No aggressive lytic or sclerotic bone lesions. IMPRESSION: 1. No findings identified to suggest residual or recurrent lymphoma within the chest abdomen or pelvis. 2. Aortic  atherosclerosis. 3. Hepatic steatosis. Electronically Signed   By: TKerby MoorsM.D.   On: 10/19/2015 10:10    Labs:  CBC:  Recent Labs  06/23/15 0820 08/19/15 0805 10/19/15 0755 11/03/15 0945  WBC 2.8* 3.1* 2.9* 2.7*  HGB 13.0 12.2 12.3 12.4  HCT 38.2 35.5 36.6 37.1  PLT 194 180 201 207    COAGS: No results for input(s): INR, APTT in the last 8760 hours.  BMP:  Recent Labs  05/12/15 0827 06/23/15 0820 08/19/15 0805 10/19/15 0756  NA 141 142 142 142  K 3.7 3.6 3.5 3.9  CO2 28 26 24 26   GLUCOSE 95 98 95 92  BUN 17.6 14.3 14.7 13.7  CALCIUM 9.5 9.4 9.3 9.2  CREATININE 1.3* 0.9 1.0 0.9    LIVER FUNCTION TESTS:  Recent Labs  05/12/15 0827 06/23/15 0820 08/19/15 0805 10/19/15 0756  BILITOT 0.80 0.68 0.74 0.34  AST 21 30 26 23   ALT 21 23 20 16   ALKPHOS 79 82 74 69  PROT 6.7 6.8 6.6 6.8  ALBUMIN 4.2 4.3 4.0 3.8    TUMOR MARKERS: No results for input(s): AFPTM, CEA, CA199, CHROMGRNA in the last 8760 hours.  Assessment and Plan: Lymphoma, complete response to treatment Plan for  port removal. Labs ok. Risks and Benefits discussed with the patient including, but not limited to bleeding, infection and need for additional procedures. All of the patient's questions were answered, patient is agreeable to proceed. Consent signed and in chart.    Thank you for this interesting consult.    A copy of this report was sent to the requesting provider on this date.  Electronically Signed: Ascencion Dike 11/03/2015, 10:33 AM   I spent a total of 20 minutes in face to face in clinical consultation, greater than 50% of which was counseling/coordinating care for port removal

## 2015-11-03 NOTE — Procedures (Signed)
RIJV PAC removal No comp/EBL 

## 2015-11-18 ENCOUNTER — Encounter (INDEPENDENT_AMBULATORY_CARE_PROVIDER_SITE_OTHER): Payer: Self-pay | Admitting: Internal Medicine

## 2015-11-19 DIAGNOSIS — B351 Tinea unguium: Secondary | ICD-10-CM | POA: Diagnosis not present

## 2015-11-19 DIAGNOSIS — M79674 Pain in right toe(s): Secondary | ICD-10-CM | POA: Diagnosis not present

## 2015-11-25 DIAGNOSIS — Z85828 Personal history of other malignant neoplasm of skin: Secondary | ICD-10-CM | POA: Diagnosis not present

## 2015-11-25 DIAGNOSIS — Z08 Encounter for follow-up examination after completed treatment for malignant neoplasm: Secondary | ICD-10-CM | POA: Diagnosis not present

## 2015-11-25 DIAGNOSIS — L438 Other lichen planus: Secondary | ICD-10-CM | POA: Diagnosis not present

## 2015-11-25 DIAGNOSIS — L820 Inflamed seborrheic keratosis: Secondary | ICD-10-CM | POA: Diagnosis not present

## 2015-12-13 DIAGNOSIS — K219 Gastro-esophageal reflux disease without esophagitis: Secondary | ICD-10-CM | POA: Diagnosis not present

## 2015-12-13 DIAGNOSIS — K5900 Constipation, unspecified: Secondary | ICD-10-CM | POA: Diagnosis not present

## 2015-12-29 DIAGNOSIS — E785 Hyperlipidemia, unspecified: Secondary | ICD-10-CM | POA: Diagnosis not present

## 2015-12-29 DIAGNOSIS — C8199 Hodgkin lymphoma, unspecified, extranodal and solid organ sites: Secondary | ICD-10-CM | POA: Diagnosis not present

## 2015-12-29 DIAGNOSIS — K219 Gastro-esophageal reflux disease without esophagitis: Secondary | ICD-10-CM | POA: Diagnosis not present

## 2016-01-14 ENCOUNTER — Telehealth: Payer: Self-pay | Admitting: Hematology and Oncology

## 2016-01-14 ENCOUNTER — Other Ambulatory Visit: Payer: Self-pay | Admitting: Hematology and Oncology

## 2016-01-14 NOTE — Telephone Encounter (Signed)
Pt needed to be moved to wed 5/10... Pt moved per [employer]

## 2016-01-14 NOTE — Telephone Encounter (Signed)
spoke w/ pt .. confirmed appt change

## 2016-01-17 ENCOUNTER — Other Ambulatory Visit: Payer: Medicare Other

## 2016-01-17 ENCOUNTER — Ambulatory Visit: Payer: Medicare Other | Admitting: Hematology and Oncology

## 2016-01-19 ENCOUNTER — Other Ambulatory Visit (HOSPITAL_BASED_OUTPATIENT_CLINIC_OR_DEPARTMENT_OTHER): Payer: Medicare Other

## 2016-01-19 ENCOUNTER — Telehealth: Payer: Self-pay | Admitting: Hematology and Oncology

## 2016-01-19 ENCOUNTER — Ambulatory Visit (HOSPITAL_BASED_OUTPATIENT_CLINIC_OR_DEPARTMENT_OTHER): Payer: Medicare Other | Admitting: Hematology and Oncology

## 2016-01-19 ENCOUNTER — Encounter: Payer: Self-pay | Admitting: Hematology and Oncology

## 2016-01-19 VITALS — BP 132/71 | HR 66 | Temp 97.9°F | Resp 18 | Ht 62.0 in | Wt 181.1 lb

## 2016-01-19 DIAGNOSIS — C8239 Follicular lymphoma grade IIIa, extranodal and solid organ sites: Secondary | ICD-10-CM

## 2016-01-19 DIAGNOSIS — Z8572 Personal history of non-Hodgkin lymphomas: Secondary | ICD-10-CM

## 2016-01-19 DIAGNOSIS — E785 Hyperlipidemia, unspecified: Secondary | ICD-10-CM | POA: Diagnosis not present

## 2016-01-19 DIAGNOSIS — Z515 Encounter for palliative care: Secondary | ICD-10-CM | POA: Insufficient documentation

## 2016-01-19 DIAGNOSIS — C44729 Squamous cell carcinoma of skin of left lower limb, including hip: Secondary | ICD-10-CM

## 2016-01-19 DIAGNOSIS — D702 Other drug-induced agranulocytosis: Secondary | ICD-10-CM | POA: Diagnosis not present

## 2016-01-19 HISTORY — DX: Other drug-induced agranulocytosis: D70.2

## 2016-01-19 LAB — COMPREHENSIVE METABOLIC PANEL
ALBUMIN: 4 g/dL (ref 3.5–5.0)
ALK PHOS: 65 U/L (ref 40–150)
ALT: 17 U/L (ref 0–55)
AST: 25 U/L (ref 5–34)
Anion Gap: 7 mEq/L (ref 3–11)
BUN: 15.2 mg/dL (ref 7.0–26.0)
CALCIUM: 9.5 mg/dL (ref 8.4–10.4)
CO2: 28 mEq/L (ref 22–29)
CREATININE: 1.1 mg/dL (ref 0.6–1.1)
Chloride: 105 mEq/L (ref 98–109)
EGFR: 59 mL/min/{1.73_m2} — ABNORMAL LOW (ref >90)
GLUCOSE: 89 mg/dL (ref 70–140)
Potassium: 4 mEq/L (ref 3.5–5.1)
Sodium: 140 mEq/L (ref 136–145)
TOTAL PROTEIN: 6.7 g/dL (ref 6.4–8.3)
Total Bilirubin: 0.6 mg/dL (ref 0.20–1.20)

## 2016-01-19 LAB — CBC WITH DIFFERENTIAL/PLATELET
BASO%: 0.2 % (ref 0.0–2.0)
BASOS ABS: 0 10*3/uL (ref 0.0–0.1)
EOS%: 3.5 % (ref 0.0–7.0)
Eosinophils Absolute: 0.1 10*3/uL (ref 0.0–0.5)
HEMATOCRIT: 39.3 % (ref 34.8–46.6)
HEMOGLOBIN: 13.1 g/dL (ref 11.6–15.9)
LYMPH#: 0.8 10*3/uL — AB (ref 0.9–3.3)
LYMPH%: 24.5 % (ref 14.0–49.7)
MCH: 27.8 pg (ref 25.1–34.0)
MCHC: 33.3 g/dL (ref 31.5–36.0)
MCV: 83.6 fL (ref 79.5–101.0)
MONO#: 0.3 10*3/uL (ref 0.1–0.9)
MONO%: 8 % (ref 0.0–14.0)
NEUT%: 63.8 % (ref 38.4–76.8)
NEUTROS ABS: 2.2 10*3/uL (ref 1.5–6.5)
Platelets: 196 10*3/uL (ref 145–400)
RBC: 4.7 10*6/uL (ref 3.70–5.45)
RDW: 15.6 % — AB (ref 11.2–14.5)
WBC: 3.4 10*3/uL — AB (ref 3.9–10.3)

## 2016-01-19 NOTE — Assessment & Plan Note (Signed)
This is likely due to recent treatment. The patient denies recent history of fevers, cough, chills, diarrhea or dysuria. She is asymptomatic from the leukopenia. I will observe for now. Overall, it is improving.

## 2016-01-19 NOTE — Progress Notes (Signed)
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 Needle biopsy was inconclusive.     08/02/2011 Imaging CT scan of the chest abdomen and pelvis showed numerous lymphadenopathy above and below the diaphragm.    08/16/2011 Imaging MRI of the abdomen revealed enlarging left kidney lesion worrisome for malignancy   09/20/2011 Initial Diagnosis Lymphoma, low grade from kidney biopsy, follicular   [id number]/[id number] Procedure Histologic type: Non-Hodgkin's lymphoma, follicular center cell type. Grade (if applicable): Favor high grade, Ki-67 ranges from 10-50%. Immunohistochemical stains: CD20, CD79a, CD10, BCL-2, BCL-6, CD21, CD3, CD43.   05/09/2013 Procedure BM biopsy is highly suspicious of involvement. Patient had persistent leukopenia   05/09/2013 Imaging PET/CT showed significant involvement of LN everywhere   07/16/2013 - 11/05/2013 Chemotherapy She completed 6 cycles of R. CHOP chemotherapy.   07/23/2013 - 07/27/2013 Hospital Admission Admitted for management of mucositis and neutropenic fever. Blood culture was positive for gram negative rods, resolved with antibiotics   10/08/2013 Adverse Reaction The dose of vincristine is reduced by 50%   12/15/2013  Imaging Repeat PET CT scan show complete response to treatment.   12/17/2013 - 10/20/2015 Chemotherapy She begin maintenance rituximab every other month x 2 years   02/23/2014 Procedure She had shave biopsy of the left upper, that came back well-differentiated squamous cell carcinoma.   04/29/2014 Imaging CT scan of the abdomen showed no evidence of disease recurrence.   10/12/2014 Imaging Repeat CT scan of the chest, abdomen and pelvis showed no evidence of recurrence.   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    INTERVAL HISTORY: Please see below for problem oriented charting. She returns for further follow-up. She feels well. She is attempting to lose weight and managed lose 8 pounds intentionally. She continues to follow her dermatologist regularly for history of squamous cell carcinoma. No new lymphadenopathy Denies recent infection  REVIEW OF SYSTEMS:   Constitutional: Denies fevers, chills or abnormal weight loss Eyes: Denies blurriness of vision Ears, nose, mouth, throat, and face: Denies mucositis or sore throat Respiratory: Denies cough, dyspnea or wheezes Cardiovascular: Denies palpitation, chest discomfort or lower extremity swelling Gastrointestinal:  Denies nausea, heartburn or change in bowel habits Lymphatics: Denies new lymphadenopathy or easy bruising Neurological:Denies numbness, tingling or new weaknesses Behavioral/Psych: Mood is stable, no new changes  All other systems were reviewed with the patient and are negative.  I have reviewed  the past medical history, past surgical history, social history and family history  with the patient and they are unchanged from previous note.  ALLERGIES:  is allergic to latex.  MEDICATIONS:  Current Outpatient Prescriptions  Medication Sig Dispense Refill  . acetaminophen (TYLENOL) 500 MG tablet Take 250 mg by mouth every 6 (six) hours as needed for moderate pain.    Marland Kitchen aspirin 81 MG tablet Take 81 mg by mouth at bedtime.     . B Complex Vitamins (B-COMPLEX/B-12 PO) Take by mouth.    Marland Kitchen CALCIUM-MAGNESIUM-ZINC PO Take 1 tablet by mouth daily.     . Cholecalciferol (VITAMIN D) 2000 UNITS tablet Take 2,000 Units by mouth daily.    . Cyanocobalamin (VITAMIN B 12 PO) Take 1 tablet by mouth daily.    Marland Kitchen dexlansoprazole (DEXILANT) 60 MG capsule Take 60 mg by mouth daily.    Marland Kitchen docusate sodium (COLACE) 100 MG capsule Take 1 capsule (100 mg total) by mouth 2 (two) times daily. (Patient taking differently: Take 100 mg by mouth daily as needed for mild constipation. ) 100 capsule 1  . loratadine (CLARITIN) 10 MG tablet Take 10 mg by mouth daily.    Marland Kitchen losartan-hydrochlorothiazide (HYZAAR) 100-12.5 MG per tablet     . Omega-3 Fatty Acids (FISH OIL) 1000 MG CAPS Take by mouth 2 (two) times daily.    . Probiotic Product (PROBIOTIC DAILY PO) Take 1 capsule by mouth daily.    Marland Kitchen pyridOXINE (VITAMIN B-6) 100 MG tablet Take 100 mg by mouth daily.     . Red Yeast Rice 600 MG CAPS Take 1 capsule by mouth every evening.      No current facility-administered medications for this visit.    PHYSICAL EXAMINATION: ECOG PERFORMANCE STATUS: 0 - Asymptomatic  Filed Vitals:   01/19/16 1052  BP: 132/71  Pulse: 66  Temp: 97.9 F (36.6 C)  Resp: 18   Filed Weights   01/19/16 1052  Weight: 181 lb 1.6 oz (82.146 kg)    GENERAL:alert, no distress and comfortable SKIN: skin color, texture, turgor are normal, no rashes or significant lesions. Noted persistent lesions on her leg EYES: normal, Conjunctiva are pink and non-injected, sclera clear OROPHARYNX:no exudate, no erythema and lips, buccal  mucosa, and tongue normal  NECK: supple, thyroid normal size, non-tender, without nodularity LYMPH:  no palpable lymphadenopathy in the cervical, axillary or inguinal LUNGS: clear to auscultation and percussion with normal breathing effort HEART: regular rate & rhythm and no murmurs and with mildlower extremity edema ABDOMEN:abdomen soft, non-tender and normal bowel sounds Musculoskeletal:no cyanosis of digits and no clubbing  NEURO: alert & oriented x 3 with fluent speech, no focal motor/sensory deficits  LABORATORY DATA:  I have reviewed the data as listed    Component Value Date/Time   NA 140 01/19/2016 1035   NA 139 09/10/2014 1012   K 4.0 01/19/2016 1035   K 3.5 09/10/2014 1012   CL 104 09/10/2014 1012   CO2 28 01/19/2016 1035   CO2 27 09/10/2014 1012   GLUCOSE 89 01/19/2016 1035   GLUCOSE 97 09/10/2014 1012   BUN 15.2 01/19/2016 1035   BUN 12 09/10/2014 1012   CREATININE 1.1 01/19/2016 1035   CREATININE 0.88 09/10/2014 1012   CALCIUM 9.5 01/19/2016 1035   CALCIUM 9.1 09/10/2014 1012   PROT 6.7 01/19/2016 1035   PROT 6.8 09/10/2014 1012   ALBUMIN 4.0 01/19/2016 1035   ALBUMIN 4.5 09/10/2014 1012   AST 25 01/19/2016 1035   AST  31 09/10/2014 1012   ALT 17 01/19/2016 1035   ALT 29 09/10/2014 1012   ALKPHOS 65 01/19/2016 1035   ALKPHOS 81 09/10/2014 1012   BILITOT 0.60 01/19/2016 1035   BILITOT 0.9 09/10/2014 1012   GFRNONAA 66* 09/10/2014 1012   GFRAA 76* 09/10/2014 1012    No results found for: SPEP, UPEP  Lab Results  Component Value Date   WBC 3.4* 01/19/2016   NEUTROABS 2.2 01/19/2016   HGB 13.1 01/19/2016   HCT 39.3 01/19/2016   MCV 83.6 01/19/2016   PLT 196 01/19/2016      Chemistry      Component Value Date/Time   NA 140 01/19/2016 1035   NA 139 09/10/2014 1012   K 4.0 01/19/2016 1035   K 3.5 09/10/2014 1012   CL 104 09/10/2014 1012   CO2 28 01/19/2016 1035   CO2 27 09/10/2014 1012   BUN 15.2 01/19/2016 1035   BUN 12 09/10/2014 1012    CREATININE 1.1 01/19/2016 1035   CREATININE 0.88 09/10/2014 1012      Component Value Date/Time   CALCIUM 9.5 01/19/2016 1035   CALCIUM 9.1 09/10/2014 1012   ALKPHOS 65 01/19/2016 1035   ALKPHOS 81 09/10/2014 1012   AST 25 01/19/2016 1035   AST 31 09/10/2014 1012   ALT 17 01/19/2016 1035   ALT 29 09/10/2014 1012   BILITOT 0.60 01/19/2016 1035   BILITOT 0.9 09/10/2014 1012      ASSESSMENT & PLAN:  History of B-cell lymphoma The patient is a long-term survivor. She is doing very well and clinically has no evidence of cancer. I do not recommend routine imaging study. 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 I recommend she return to dermatologist for further evaluation and treatment    Drug-induced leukopenia ([place]) This is likely due to recent treatment. The patient denies recent history of fevers, cough, chills, diarrhea or dysuria. She is asymptomatic from the leukopenia. I will observe for now. Overall, it is improving.  Quality of life palliative care encounter We discussed increase physical activity and possible enrollment with the [employer] program with the [facility]. I gave her additional resources from the [place]. We discussed importance of vitamin D supplementation & annual influenza vaccination    Orders Placed This Encounter  Procedures  . CBC with Differential/Platelet    Standing Status: Standing     Number of Occurrences: 3     Standing Expiration Date: 01/18/2017  . Comprehensive metabolic panel    Standing Status: Standing     Number of Occurrences: 3     Standing Expiration Date: 01/18/2017  . Lactate dehydrogenase (LDH)    Standing Status: Standing     Number of Occurrences: 3     Standing Expiration Date: 01/18/2017  . Amb  Referral to Survivorship Long term    Referral Priority:  Routine    Referral Type:  Consultation    Referred to Provider:  Holley Bouche, NP    Number of Visits Requested:  1   All questions were answered. The patient knows to call the clinic with any problems, questions or concerns. No barriers to learning was detected. I spent 15 minutes counseling the patient face to face. The total time spent in the appointment was 20 minutes and  more than 50% was on counseling and review of test results     [facility], Johnross Nabozny, MD 01/19/2016 3:27 PM

## 2016-01-19 NOTE — Assessment & Plan Note (Signed)
The patient is a long-term survivor. She is doing very well and clinically has no evidence of cancer. I do not recommend routine imaging study. I recommend cancer survivorship clinic in 6 months with repeat blood work and I will see her back in one year with history, physical examination and blood work

## 2016-01-19 NOTE — Assessment & Plan Note (Signed)
She has persistent skin lesions on both legs. I recommend she return to dermatologist for further evaluation and treatment

## 2016-01-19 NOTE — Telephone Encounter (Signed)
Gave pt apt & avs °

## 2016-01-19 NOTE — Assessment & Plan Note (Addendum)
We discussed increase physical activity and possible enrollment with the [employer] program with the [facility]. I gave her additional resources from the [place]. We discussed importance of vitamin D supplementation & annual influenza vaccination

## 2016-01-21 DIAGNOSIS — M1711 Unilateral primary osteoarthritis, right knee: Secondary | ICD-10-CM | POA: Diagnosis not present

## 2016-01-27 ENCOUNTER — Ambulatory Visit (INDEPENDENT_AMBULATORY_CARE_PROVIDER_SITE_OTHER): Payer: Medicare Other | Admitting: Internal Medicine

## 2016-01-28 DIAGNOSIS — M79674 Pain in right toe(s): Secondary | ICD-10-CM | POA: Diagnosis not present

## 2016-01-28 DIAGNOSIS — B351 Tinea unguium: Secondary | ICD-10-CM | POA: Diagnosis not present

## 2016-02-22 DIAGNOSIS — M1711 Unilateral primary osteoarthritis, right knee: Secondary | ICD-10-CM | POA: Diagnosis not present

## 2016-03-03 DIAGNOSIS — M1711 Unilateral primary osteoarthritis, right knee: Secondary | ICD-10-CM | POA: Diagnosis not present

## 2016-03-13 DIAGNOSIS — M1711 Unilateral primary osteoarthritis, right knee: Secondary | ICD-10-CM | POA: Diagnosis not present

## 2016-04-03 DIAGNOSIS — E785 Hyperlipidemia, unspecified: Secondary | ICD-10-CM | POA: Diagnosis not present

## 2016-04-03 DIAGNOSIS — Z6833 Body mass index (BMI) 33.0-33.9, adult: Secondary | ICD-10-CM | POA: Diagnosis not present

## 2016-04-03 DIAGNOSIS — I10 Essential (primary) hypertension: Secondary | ICD-10-CM | POA: Diagnosis not present

## 2016-04-03 DIAGNOSIS — C8199 Hodgkin lymphoma, unspecified, extranodal and solid organ sites: Secondary | ICD-10-CM | POA: Diagnosis not present

## 2016-04-07 DIAGNOSIS — B351 Tinea unguium: Secondary | ICD-10-CM | POA: Diagnosis not present

## 2016-04-07 DIAGNOSIS — M79674 Pain in right toe(s): Secondary | ICD-10-CM | POA: Diagnosis not present

## 2016-04-12 DIAGNOSIS — Z780 Asymptomatic menopausal state: Secondary | ICD-10-CM | POA: Diagnosis not present

## 2016-04-26 DIAGNOSIS — L438 Other lichen planus: Secondary | ICD-10-CM | POA: Diagnosis not present

## 2016-04-26 DIAGNOSIS — D485 Neoplasm of uncertain behavior of skin: Secondary | ICD-10-CM | POA: Diagnosis not present

## 2016-04-26 DIAGNOSIS — L910 Hypertrophic scar: Secondary | ICD-10-CM | POA: Diagnosis not present

## 2016-04-27 DIAGNOSIS — M1711 Unilateral primary osteoarthritis, right knee: Secondary | ICD-10-CM | POA: Diagnosis not present

## 2016-04-28 DIAGNOSIS — L821 Other seborrheic keratosis: Secondary | ICD-10-CM | POA: Diagnosis not present

## 2016-05-31 ENCOUNTER — Other Ambulatory Visit: Payer: Self-pay | Admitting: Family Medicine

## 2016-05-31 DIAGNOSIS — Z139 Encounter for screening, unspecified: Secondary | ICD-10-CM

## 2016-06-03 DIAGNOSIS — I10 Essential (primary) hypertension: Secondary | ICD-10-CM | POA: Diagnosis not present

## 2016-06-03 DIAGNOSIS — E785 Hyperlipidemia, unspecified: Secondary | ICD-10-CM | POA: Diagnosis not present

## 2016-06-03 DIAGNOSIS — Z6832 Body mass index (BMI) 32.0-32.9, adult: Secondary | ICD-10-CM | POA: Diagnosis not present

## 2016-06-03 DIAGNOSIS — Z23 Encounter for immunization: Secondary | ICD-10-CM | POA: Diagnosis not present

## 2016-06-13 ENCOUNTER — Ambulatory Visit
Admission: RE | Admit: 2016-06-13 | Discharge: 2016-06-13 | Disposition: A | Discharge status: Home / Self Care | Payer: Medicare Other | Source: Ambulatory Visit | Attending: Family Medicine | Admitting: Family Medicine

## 2016-06-13 DIAGNOSIS — Z139 Encounter for screening, unspecified: Secondary | ICD-10-CM

## 2016-06-13 DIAGNOSIS — Z1231 Encounter for screening mammogram for malignant neoplasm of breast: Secondary | ICD-10-CM | POA: Diagnosis not present

## 2016-06-16 DIAGNOSIS — B351 Tinea unguium: Secondary | ICD-10-CM | POA: Diagnosis not present

## 2016-06-16 DIAGNOSIS — M79674 Pain in right toe(s): Secondary | ICD-10-CM | POA: Diagnosis not present

## 2016-06-19 ENCOUNTER — Other Ambulatory Visit: Payer: Self-pay | Admitting: Family Medicine

## 2016-06-19 DIAGNOSIS — R928 Other abnormal and inconclusive findings on diagnostic imaging of breast: Secondary | ICD-10-CM

## 2016-06-27 ENCOUNTER — Ambulatory Visit
Admission: RE | Admit: 2016-06-27 | Discharge: 2016-06-27 | Disposition: A | Discharge status: Home / Self Care | Payer: Medicare Other | Source: Ambulatory Visit | Attending: Family Medicine | Admitting: Family Medicine

## 2016-06-27 DIAGNOSIS — N6489 Other specified disorders of breast: Secondary | ICD-10-CM | POA: Diagnosis not present

## 2016-06-27 DIAGNOSIS — L905 Scar conditions and fibrosis of skin: Secondary | ICD-10-CM | POA: Diagnosis not present

## 2016-06-27 DIAGNOSIS — R928 Other abnormal and inconclusive findings on diagnostic imaging of breast: Secondary | ICD-10-CM | POA: Diagnosis not present

## 2016-06-27 DIAGNOSIS — L910 Hypertrophic scar: Secondary | ICD-10-CM | POA: Diagnosis not present

## 2016-06-27 DIAGNOSIS — L438 Other lichen planus: Secondary | ICD-10-CM | POA: Diagnosis not present

## 2016-07-26 DIAGNOSIS — J069 Acute upper respiratory infection, unspecified: Secondary | ICD-10-CM | POA: Diagnosis not present

## 2016-07-27 DIAGNOSIS — M1712 Unilateral primary osteoarthritis, left knee: Secondary | ICD-10-CM | POA: Diagnosis not present

## 2016-07-27 DIAGNOSIS — M170 Bilateral primary osteoarthritis of knee: Secondary | ICD-10-CM | POA: Diagnosis not present

## 2016-07-27 DIAGNOSIS — M1711 Unilateral primary osteoarthritis, right knee: Secondary | ICD-10-CM | POA: Diagnosis not present

## 2016-08-17 DIAGNOSIS — L910 Hypertrophic scar: Secondary | ICD-10-CM | POA: Diagnosis not present

## 2016-08-17 DIAGNOSIS — L438 Other lichen planus: Secondary | ICD-10-CM | POA: Diagnosis not present

## 2016-08-25 DIAGNOSIS — B351 Tinea unguium: Secondary | ICD-10-CM | POA: Diagnosis not present

## 2016-08-25 DIAGNOSIS — M79674 Pain in right toe(s): Secondary | ICD-10-CM | POA: Diagnosis not present

## 2016-10-02 ENCOUNTER — Telehealth: Payer: Self-pay | Admitting: Hematology and Oncology

## 2016-10-02 ENCOUNTER — Other Ambulatory Visit: Payer: Self-pay | Admitting: Adult Health

## 2016-10-02 DIAGNOSIS — M545 Low back pain: Secondary | ICD-10-CM | POA: Diagnosis not present

## 2016-10-02 DIAGNOSIS — R21 Rash and other nonspecific skin eruption: Secondary | ICD-10-CM | POA: Diagnosis not present

## 2016-10-02 DIAGNOSIS — Z8572 Personal history of non-Hodgkin lymphomas: Secondary | ICD-10-CM

## 2016-10-02 NOTE — Telephone Encounter (Signed)
Spoke patient re February appointments.

## 2016-10-25 DIAGNOSIS — M1711 Unilateral primary osteoarthritis, right knee: Secondary | ICD-10-CM | POA: Diagnosis not present

## 2016-10-26 ENCOUNTER — Other Ambulatory Visit (HOSPITAL_BASED_OUTPATIENT_CLINIC_OR_DEPARTMENT_OTHER): Payer: Medicare Other

## 2016-10-26 DIAGNOSIS — C44729 Squamous cell carcinoma of skin of left lower limb, including hip: Secondary | ICD-10-CM

## 2016-10-26 DIAGNOSIS — Z8572 Personal history of non-Hodgkin lymphomas: Secondary | ICD-10-CM

## 2016-10-26 LAB — COMPREHENSIVE METABOLIC PANEL
ALBUMIN: 4.2 g/dL (ref 3.5–5.0)
ALT: 17 U/L (ref 0–55)
AST: 23 U/L (ref 5–34)
Alkaline Phosphatase: 63 U/L (ref 40–150)
Anion Gap: 10 mEq/L (ref 3–11)
BILIRUBIN TOTAL: 1.09 mg/dL (ref 0.20–1.20)
BUN: 17.7 mg/dL (ref 7.0–26.0)
CALCIUM: 9.4 mg/dL (ref 8.4–10.4)
CO2: 25 mEq/L (ref 22–29)
Chloride: 105 mEq/L (ref 98–109)
Creatinine: 1 mg/dL (ref 0.6–1.1)
EGFR: 63 mL/min/{1.73_m2} — ABNORMAL LOW (ref >90)
GLUCOSE: 89 mg/dL (ref 70–140)
Potassium: 3.6 mEq/L (ref 3.5–5.1)
SODIUM: 140 meq/L (ref 136–145)
TOTAL PROTEIN: 7 g/dL (ref 6.4–8.3)

## 2016-10-26 LAB — CBC WITH DIFFERENTIAL/PLATELET
BASO%: 0.6 % (ref 0.0–2.0)
Basophils Absolute: 0 10*3/uL (ref 0.0–0.1)
EOS ABS: 0.1 10*3/uL (ref 0.0–0.5)
EOS%: 1.4 % (ref 0.0–7.0)
HEMATOCRIT: 37.2 % (ref 34.8–46.6)
HEMOGLOBIN: 12.9 g/dL (ref 11.6–15.9)
LYMPH#: 1.1 10*3/uL (ref 0.9–3.3)
LYMPH%: 16.7 % (ref 14.0–49.7)
MCH: 29.2 pg (ref 25.1–34.0)
MCHC: 34.7 g/dL (ref 31.5–36.0)
MCV: 84.2 fL (ref 79.5–101.0)
MONO#: 0.5 10*3/uL (ref 0.1–0.9)
MONO%: 7.5 % (ref 0.0–14.0)
NEUT%: 73.8 % (ref 38.4–76.8)
NEUTROS ABS: 4.7 10*3/uL (ref 1.5–6.5)
Platelets: 180 10*3/uL (ref 145–400)
RBC: 4.42 10*6/uL (ref 3.70–5.45)
RDW: 13.8 % (ref 11.2–14.5)
WBC: 6.4 10*3/uL (ref 3.9–10.3)

## 2016-10-26 LAB — LACTATE DEHYDROGENASE: LDH: 205 U/L (ref 125–245)

## 2016-10-31 DIAGNOSIS — R21 Rash and other nonspecific skin eruption: Secondary | ICD-10-CM | POA: Diagnosis not present

## 2016-10-31 DIAGNOSIS — E785 Hyperlipidemia, unspecified: Secondary | ICD-10-CM | POA: Diagnosis not present

## 2016-11-01 DIAGNOSIS — M1711 Unilateral primary osteoarthritis, right knee: Secondary | ICD-10-CM | POA: Diagnosis not present

## 2016-11-01 DIAGNOSIS — M170 Bilateral primary osteoarthritis of knee: Secondary | ICD-10-CM | POA: Diagnosis not present

## 2016-11-01 NOTE — Progress Notes (Signed)
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 Needle biopsy was inconclusive.        08/02/2011 Imaging    CT scan of the chest abdomen and pelvis showed numerous lymphadenopathy above and below the diaphragm.       08/16/2011 Imaging    MRI of the abdomen revealed enlarging left kidney lesion worrisome for malignancy      09/20/2011 Initial Diagnosis    Lymphoma, low grade from kidney biopsy, follicular      0/06/9322 Procedure    Histologic type: Non-Hodgkin's lymphoma, follicular center cell type. Grade (if applicable): Favor high grade, Ki-67 ranges from 10-50%. Immunohistochemical stains: CD20, CD79a, CD10, BCL-2, BCL-6, CD21, CD3, CD43.      05/09/2013 Procedure    BM biopsy is highly suspicious of involvement. Patient had persistent leukopenia      05/09/2013 Imaging    PET/CT showed significant involvement of LN everywhere      07/16/2013 - 11/05/2013 Chemotherapy    She completed 6 cycles of R. CHOP chemotherapy.      07/23/2013 - 07/27/2013 Hospital Admission    Admitted for management of mucositis and neutropenic fever. Blood culture was positive for gram negative rods, resolved with antibiotics      10/08/2013 Adverse Reaction    The dose of vincristine is reduced by 50%      12/15/2013  Imaging    Repeat PET CT scan show complete response to treatment.      12/17/2013 - 10/20/2015 Chemotherapy    She begin maintenance rituximab every other month x 2 years      02/23/2014 Procedure    She had shave biopsy of the left upper, that came back well-differentiated squamous cell carcinoma.      04/29/2014 Imaging    CT scan of the abdomen showed no evidence of disease recurrence.      10/12/2014 Imaging    Repeat CT scan of the chest, abdomen and pelvis showed no evidence of recurrence.      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       INTERVAL HISTORY:  [name] presents to the Survivorship Clinic today for routine follow-up for her history of follicular lymphoma.  Overall, she reports feeling quite well.  Her main concern is multiple skin lesion that she has had all over her  body.  She has seen a couple of dermatologists in the area, with no answers.  She says that they have ruled out cancer, however that is really all they have ruled out.  She has an appointment at [facility] in one year.  She wants to know if there is anyone I could suggest who may can see her sooner.     REVIEW OF SYSTEMS:  Review of Systems  Constitutional: Negative for chills, diaphoresis, fever, malaise/fatigue and weight loss.  HENT: Negative for hearing loss and tinnitus.   Eyes: Negative for blurred vision and double vision.  Respiratory: Negative for cough and shortness of breath.   Cardiovascular: Negative for chest pain, palpitations and leg swelling.  Gastrointestinal: Negative for abdominal pain, blood in stool,  constipation, diarrhea, heartburn, melena, nausea and vomiting.  Genitourinary: Negative for dysuria.  Musculoskeletal: Negative for back pain and joint pain.  Skin: Positive for itching and rash.  Neurological: Negative for dizziness, tingling, weakness and headaches.  Endo/Heme/Allergies: Negative for environmental allergies. Does not bruise/bleed easily.  Psychiatric/Behavioral: Negative for depression. The patient is not nervous/anxious.   GU: Denies vaginal bleeding, discharge, or dryness.  Breast: Denies any new nodularity, masses, tenderness, nipple changes, or nipple discharge.     PAST MEDICAL/SURGICAL HISTORY:  Past Medical History:  Diagnosis Date  . 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 Congenital coronary artery anomaly   . Enlargement of lymph nodes 07/26/2011  . Follicular lymphoma ([place]) 06/27/2013  . 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  B-cell, favor follicular NHL.  CD79a, CD20 and CD10 positive.  Cyclin D1 negative.   Marland Kitchen GERD (gastroesophageal reflux disease)   . Hemorrhoids 08/05/2013  . HTN (hypertension)   . Hyperlipidemia   . Leukopenia 09/16/2013  . Lichen planus   . Thyroid disease   . UTI (urinary tract infection) 11/10/2013   Past Surgical History:  Procedure Laterality Date  . ABDOMINAL HYSTERECTOMY    . BONE MARROW ASPIRATION  05/09/2013  . BONE MARROW BIOPSY  05/09/2013  . KNEE ARTHROSCOPY W/ DEBRIDEMENT  05/2011   right  . LYMPH NODE DISSECTION  x 3 different times   behind left ear 2012/ left clavical area  . MR BREAST BILATERAL  cyst removal  . TUBAL LIGATION       ALLERGIES:  Allergies  Allergen Reactions  . Latex Swelling and Rash     CURRENT MEDICATIONS:  Outpatient Encounter Prescriptions as of 11/02/2016  Medication Sig Note  . acetaminophen (TYLENOL) 500 MG tablet Take 250 mg by mouth every 6 (six) hours as needed for  moderate pain.   Marland Kitchen aspirin 81 MG tablet Take 81 mg by mouth at bedtime.    . B Complex Vitamins (B-COMPLEX/B-12 PO) Take by mouth.   . Black Pepper-Turmeric (TURMERIC COMPLEX/BLACK PEPPER PO) Take by mouth.   Marland Kitchen CALCIUM-MAGNESIUM-ZINC PO Take 1 tablet by mouth daily.    . Cholecalciferol (VITAMIN D) 2000 UNITS tablet Take 2,000 Units by mouth daily.   . Cyanocobalamin (VITAMIN B 12 PO) Take 1 tablet by mouth daily.   Marland Kitchen dexlansoprazole (DEXILANT) 60 MG capsule Take 60 mg by mouth daily.   Marland Kitchen loratadine (CLARITIN) 10 MG tablet Take 10 mg by mouth daily.   Marland Kitchen losartan-hydrochlorothiazide (HYZAAR) 100-12.5 MG per tablet  12/17/2013: Whole tablet  . Omega-3 Fatty Acids (FISH OIL) 1000 MG CAPS Take by mouth 2 (two) times daily.   Marland Kitchen  polyethylene glycol (MIRALAX / GLYCOLAX) packet Take 17 g by mouth daily.   . Probiotic Product (PROBIOTIC DAILY PO) Take 1 capsule by mouth daily.   Marland Kitchen pyridOXINE (VITAMIN B-6) 100 MG tablet Take 100 mg by mouth daily.    . Red Yeast Rice 600 MG CAPS Take 1 capsule by mouth every evening.    . [DISCONTINUED] docusate sodium (COLACE) 100 MG capsule Take 1 capsule (100 mg total) by mouth 2 (two) times daily. (Patient taking differently: Take 100 mg by mouth daily as needed for mild constipation. )    No facility-administered encounter medications on file as of 11/02/2016.      ONCOLOGIC FAMILY HISTORY:  Family History  Problem Relation Age of Onset  . Cancer Mother     breast  . Stroke Mother   . Cancer Father     pancreatic cancer  . Heart disease Father   . Cancer Brother     kidney cancer/brain tumor  . Heart disease Brother   . Coronary artery disease Brother       SOCIAL HISTORY:  [name] is married and lives with her husband in [place], [place]. Currently retired.  Denies any current or history of tobacco, alcohol, or illicit drug use.   PHYSICAL EXAMINATION:  Vital Signs: Vitals:   11/02/16 1323  BP: (!) 149/77  Pulse: 60  Resp: 17  Temp: 98  F (36.7 C)   Filed Weights   11/02/16 1323  Weight: 184 lb (83.5 kg)   General: Well-nourished, well-appearing woman in no acute distress. Accompanied by her husband today. HEENT: Head is normocephalic.  Pupils equal and reactive to light. Conjunctivae clear without exudate.  Sclerae anicteric. Oral mucosa is pink, moist.  Oropharynx is pink without lesions or erythema.  Lymph: No cervical, supraclavicular, infraclavicular, epitrochlear, inguinal, or axillary lymphadenopathy noted on palpation.  Cardiovascular: Regular rate and rhythm.Marland Kitchen Respiratory: Clear to auscultation bilaterally. Chest expansion symmetric; breathing non-labored.  GI: Abdomen soft and round; non-tender, non-distended. Bowel sounds normoactive. No hepatosplenomegaly.   GU: Deferred.  Neuro: No focal deficits. Steady gait.  Psych: Mood and affect normal and appropriate for situation.  Extremities: warm and well perfused, scant BLE Skin: Warm and dry. Multiple darkened skin lesions that are asymmetrical circular lesions.  Some are raised, and some are not.  Some are erythematous and some are not.  The darkened ones appear dry and patchy.  These range in size from 51m to 1cm.     LABORATORY DATA:  No visits with results within 1 Day(s) from this visit.  Latest known visit with results is:  Appointment on 10/26/2016  Component Date Value Ref Range Status  . WBC 10/26/2016 6.4  3.9 - 10.3 10e3/uL Final  . NEUT# 10/26/2016 4.7  1.5 - 6.5 10e3/uL Final  . HGB 10/26/2016 12.9  11.6 - 15.9 g/dL Final  . HCT 10/26/2016 37.2  34.8 - 46.6 % Final  . Platelets 10/26/2016 180  145 - 400 10e3/uL Final  . MCV 10/26/2016 84.2  79.5 - 101.0 fL Final  . MCH 10/26/2016 29.2  25.1 - 34.0 pg Final  . MCHC 10/26/2016 34.7  31.5 - 36.0 g/dL Final  . RBC 10/26/2016 4.42  3.70 - 5.45 10e6/uL Final  . RDW 10/26/2016 13.8  11.2 - 14.5 % Final  . lymph# 10/26/2016 1.1  0.9 - 3.3 10e3/uL Final  . MONO# 10/26/2016 0.5  0.1 - 0.9 10e3/uL  Final  . Eosinophils Absolute 10/26/2016 0.1  0.0 - 0.5  10e3/uL Final  . Basophils Absolute 10/26/2016 0.0  0.0 - 0.1 10e3/uL Final  . NEUT% 10/26/2016 73.8  38.4 - 76.8 % Final  . LYMPH% 10/26/2016 16.7  14.0 - 49.7 % Final  . MONO% 10/26/2016 7.5  0.0 - 14.0 % Final  . EOS% 10/26/2016 1.4  0.0 - 7.0 % Final  . BASO% 10/26/2016 0.6  0.0 - 2.0 % Final  . Sodium 10/26/2016 140  136 - 145 mEq/L Final  . Potassium 10/26/2016 3.6  3.5 - 5.1 mEq/L Final  . Chloride 10/26/2016 105  98 - 109 mEq/L Final  . CO2 10/26/2016 25  22 - 29 mEq/L Final  . Glucose 10/26/2016 89  70 - 140 mg/dl Final  . BUN 10/26/2016 17.7  7.0 - 26.0 mg/dL Final  . Creatinine 10/26/2016 1.0  0.6 - 1.1 mg/dL Final  . Total Bilirubin 10/26/2016 1.09  0.20 - 1.20 mg/dL Final  . Alkaline Phosphatase 10/26/2016 63  40 - 150 U/L Final  . AST 10/26/2016 23  5 - 34 U/L Final  . ALT 10/26/2016 17  0 - 55 U/L Final  . Total Protein 10/26/2016 7.0  6.4 - 8.3 g/dL Final  . Albumin 10/26/2016 4.2  3.5 - 5.0 g/dL Final  . Calcium 10/26/2016 9.4  8.4 - 10.4 mg/dL Final  . Anion Gap 10/26/2016 10  3 - 11 mEq/L Final  . EGFR 10/26/2016 63* >90 ml/min/1.73 m2 Final  . LDH 10/26/2016 205  125 - 245 U/L Final    DIAGNOSTIC IMAGING:  None for this visit     ASSESSMENT AND PLAN:  Ms.. Kennedy is a pleasant 71 y.o. woman with history of Stage IVA follicular lymphoma , diagnosed in 2014; treated with R-CHOP and maintenance Rituximab. [name] presents to the Survivorship Clinic for surveillance and routine follow-up.   1. History of Stage IVA follicular lymphoma:  [name] is currently clinically and radiographically without evidence of disease or recurrence of lymphoma. She will follow-up in the Survivorship Clinic in 1 year with labs, history, and physical exam per surveillance protocol.  I encouraged her to call me with any questions or concerns before her next visit at the cancer center, and I would be happy to see the patient sooner,  if needed.      2. Cancer screening:  Due to [name]'s history and age, she should receive screening for skin cancers, breast cancer, and colon cancer. The patient was encouraged to follow-up with her PCP for appropriate cancer screenings.   3. Health maintenance and wellness promotion: [name] was encouraged to consume 5-7 servings of fruits and vegetables per day. The patient was also encouraged to engage in moderate to vigorous exercise for 30 minutes per day most days of the week. [name] was instructed to limit her alcohol consumption and continue to abstain from tobacco use.  I gave [name] informational handouts on the above and reviewed these with her in detail.  4. Skin Rash: patient does not want to wait one year to see dermatology.  We have contacted [facility] [place] office at [facility] to see if she would be willing to see the patient and how quickly she can get her in    Dispo:  -Return to cancer center to see Survivorship NP in one year -follow up with Dr. Alvy Bimler in May as scheduled with labs before hand.  A total of (30) minutes of face-to-face time was spent with this patient with greater than 50% of that time  in counseling and care-coordination.   Charlestine Massed, NP Survivorship Program [place] [phone]   Note: PRIMARY CARE PROVIDER Maggie Font, [place] [phone]

## 2016-11-02 ENCOUNTER — Encounter: Payer: Self-pay | Admitting: Adult Health

## 2016-11-02 ENCOUNTER — Ambulatory Visit (HOSPITAL_BASED_OUTPATIENT_CLINIC_OR_DEPARTMENT_OTHER): Payer: Medicare Other | Admitting: Adult Health

## 2016-11-02 VITALS — BP 149/77 | HR 60 | Temp 98.0°F | Resp 17 | Wt 184.0 lb

## 2016-11-02 DIAGNOSIS — R21 Rash and other nonspecific skin eruption: Secondary | ICD-10-CM

## 2016-11-02 DIAGNOSIS — Z8589 Personal history of malignant neoplasm of other organs and systems: Secondary | ICD-10-CM

## 2016-11-02 DIAGNOSIS — Z8572 Personal history of non-Hodgkin lymphomas: Secondary | ICD-10-CM

## 2016-11-03 DIAGNOSIS — M79674 Pain in right toe(s): Secondary | ICD-10-CM | POA: Diagnosis not present

## 2016-11-03 DIAGNOSIS — B351 Tinea unguium: Secondary | ICD-10-CM | POA: Diagnosis not present

## 2016-11-08 DIAGNOSIS — M1711 Unilateral primary osteoarthritis, right knee: Secondary | ICD-10-CM | POA: Diagnosis not present

## 2016-11-28 DIAGNOSIS — D489 Neoplasm of uncertain behavior, unspecified: Secondary | ICD-10-CM | POA: Diagnosis not present

## 2016-11-28 DIAGNOSIS — L821 Other seborrheic keratosis: Secondary | ICD-10-CM | POA: Diagnosis not present

## 2016-11-28 DIAGNOSIS — L309 Dermatitis, unspecified: Secondary | ICD-10-CM | POA: Diagnosis not present

## 2016-12-12 DIAGNOSIS — L280 Lichen simplex chronicus: Secondary | ICD-10-CM | POA: Diagnosis not present

## 2016-12-13 DIAGNOSIS — L309 Dermatitis, unspecified: Secondary | ICD-10-CM | POA: Diagnosis not present

## 2016-12-13 DIAGNOSIS — Z6833 Body mass index (BMI) 33.0-33.9, adult: Secondary | ICD-10-CM | POA: Diagnosis not present

## 2016-12-13 DIAGNOSIS — I10 Essential (primary) hypertension: Secondary | ICD-10-CM | POA: Diagnosis not present

## 2016-12-22 DIAGNOSIS — K219 Gastro-esophageal reflux disease without esophagitis: Secondary | ICD-10-CM | POA: Diagnosis not present

## 2017-01-12 DIAGNOSIS — M79674 Pain in right toe(s): Secondary | ICD-10-CM | POA: Diagnosis not present

## 2017-01-12 DIAGNOSIS — B351 Tinea unguium: Secondary | ICD-10-CM | POA: Diagnosis not present

## 2017-01-18 ENCOUNTER — Telehealth: Payer: Self-pay | Admitting: Hematology and Oncology

## 2017-01-18 ENCOUNTER — Encounter: Payer: Self-pay | Admitting: Hematology and Oncology

## 2017-01-18 ENCOUNTER — Other Ambulatory Visit (HOSPITAL_BASED_OUTPATIENT_CLINIC_OR_DEPARTMENT_OTHER): Payer: Medicare Other

## 2017-01-18 ENCOUNTER — Ambulatory Visit (HOSPITAL_BASED_OUTPATIENT_CLINIC_OR_DEPARTMENT_OTHER): Payer: Medicare Other | Admitting: Hematology and Oncology

## 2017-01-18 DIAGNOSIS — Z8572 Personal history of non-Hodgkin lymphomas: Secondary | ICD-10-CM | POA: Diagnosis not present

## 2017-01-18 DIAGNOSIS — C8239 Follicular lymphoma grade IIIa, extranodal and solid organ sites: Secondary | ICD-10-CM

## 2017-01-18 DIAGNOSIS — I10 Essential (primary) hypertension: Secondary | ICD-10-CM | POA: Diagnosis not present

## 2017-01-18 LAB — CBC WITH DIFFERENTIAL/PLATELET
BASO%: 1.2 % (ref 0.0–2.0)
Basophils Absolute: 0.1 10*3/uL (ref 0.0–0.1)
EOS%: 4.2 % (ref 0.0–7.0)
Eosinophils Absolute: 0.2 10*3/uL (ref 0.0–0.5)
HEMATOCRIT: 39.9 % (ref 34.8–46.6)
HGB: 13.4 g/dL (ref 11.6–15.9)
LYMPH#: 1.1 10*3/uL (ref 0.9–3.3)
LYMPH%: 26.6 % (ref 14.0–49.7)
MCH: 28.7 pg (ref 25.1–34.0)
MCHC: 33.6 g/dL (ref 31.5–36.0)
MCV: 85.4 fL (ref 79.5–101.0)
MONO#: 0.4 10*3/uL (ref 0.1–0.9)
MONO%: 8.4 % (ref 0.0–14.0)
NEUT#: 2.6 10*3/uL (ref 1.5–6.5)
NEUT%: 59.6 % (ref 38.4–76.8)
Platelets: 166 10*3/uL (ref 145–400)
RBC: 4.67 10*6/uL (ref 3.70–5.45)
RDW: 14.3 % (ref 11.2–14.5)
WBC: 4.3 10*3/uL (ref 3.9–10.3)

## 2017-01-18 LAB — COMPREHENSIVE METABOLIC PANEL
ALT: 19 U/L (ref 0–55)
AST: 22 U/L (ref 5–34)
Albumin: 4.3 g/dL (ref 3.5–5.0)
Alkaline Phosphatase: 60 U/L (ref 40–150)
Anion Gap: 8 mEq/L (ref 3–11)
BUN: 14 mg/dL (ref 7.0–26.0)
CHLORIDE: 107 meq/L (ref 98–109)
CO2: 25 meq/L (ref 22–29)
CREATININE: 1.2 mg/dL — AB (ref 0.6–1.1)
Calcium: 9.3 mg/dL (ref 8.4–10.4)
EGFR: 52 mL/min/{1.73_m2} — ABNORMAL LOW (ref >90)
Glucose: 94 mg/dl (ref 70–140)
Potassium: 4.1 mEq/L (ref 3.5–5.1)
SODIUM: 140 meq/L (ref 136–145)
Total Bilirubin: 1.13 mg/dL (ref 0.20–1.20)
Total Protein: 7 g/dL (ref 6.4–8.3)

## 2017-01-18 LAB — LACTATE DEHYDROGENASE: LDH: 217 U/L (ref 125–245)

## 2017-01-18 NOTE — Telephone Encounter (Signed)
Appointments scheduled per 5.10.18 LOS. Patient given AVS report and calendars with future scheduled appointments. °

## 2017-01-18 NOTE — Assessment & Plan Note (Addendum)
The patient is a long-term survivor. She is doing very well and clinically has no evidence of cancer. I do not recommend routine imaging study.  I will see her back in one year with history, physical examination and blood work We discussed the importance of weight loss and exercise.  I encouraged her to participate with water aerobics at the [facility]

## 2017-01-18 NOTE — Progress Notes (Signed)
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 Needle biopsy was inconclusive.        08/02/2011 Imaging    CT scan of the chest abdomen and pelvis showed numerous lymphadenopathy above and below the diaphragm.       08/16/2011 Imaging    MRI of the abdomen revealed enlarging left kidney lesion worrisome for malignancy      09/20/2011 Initial Diagnosis    Lymphoma, low grade from kidney biopsy, follicular      0/99/8338 Procedure    Histologic type: Non-Hodgkin's lymphoma, follicular center cell type. Grade (if applicable): Favor high grade, Ki-67 ranges from 10-50%. Immunohistochemical stains: CD20, CD79a, CD10, BCL-2, BCL-6, CD21, CD3, CD43.      05/09/2013 Procedure    BM biopsy is highly suspicious of involvement. Patient had persistent leukopenia      05/09/2013 Imaging    PET/CT showed significant involvement of LN everywhere      07/16/2013 - 11/05/2013 Chemotherapy    She completed 6 cycles of R. CHOP chemotherapy.      07/23/2013 - 07/27/2013 Hospital Admission    Admitted for management of mucositis and neutropenic fever. Blood culture was positive for gram negative rods, resolved with antibiotics       10/08/2013 Adverse Reaction    The dose of vincristine is reduced by 50%      12/15/2013 Imaging    Repeat PET CT scan show complete response to treatment.      12/17/2013 - 10/20/2015 Chemotherapy    She begin maintenance rituximab every other month x 2 years      02/23/2014 Procedure    She had shave biopsy of the left upper, that came back well-differentiated squamous cell carcinoma.      04/29/2014 Imaging    CT scan of the abdomen showed no evidence of disease recurrence.      10/12/2014 Imaging    Repeat CT scan of the chest, abdomen and pelvis showed no evidence of recurrence.      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       INTERVAL HISTORY: Please see below for problem oriented charting. She returns for further follow-up. She follows with dermatologist for management of history of skin cancer and skin rash No new lymphadenopathy  Denies recent infection  REVIEW OF SYSTEMS:   Constitutional: Denies fevers, chills or abnormal weight loss Eyes: Denies blurriness of vision Ears, nose, mouth, throat, and face: Denies mucositis or sore throat Respiratory: Denies cough, dyspnea or wheezes Cardiovascular: Denies palpitation, chest discomfort or lower extremity swelling Gastrointestinal:  Denies nausea, heartburn or change in bowel habits Skin: Denies abnormal skin rashes Lymphatics: Denies new lymphadenopathy or easy bruising Neurological:Denies numbness, tingling or new weaknesses Behavioral/Psych: Mood is stable, no new changes  All other systems were reviewed with the patient and are  negative.  I have reviewed the past medical history, past surgical history, social history and family history with the patient and they are unchanged from previous note.  ALLERGIES:  is allergic to latex.  MEDICATIONS:  Current Outpatient Prescriptions  Medication Sig Dispense Refill  . acetaminophen (TYLENOL) 500 MG tablet Take 250 mg by mouth every 6 (six) hours as needed for moderate pain.    Marland Kitchen aspirin 81 MG tablet Take 81 mg by mouth at bedtime.     . B Complex Vitamins (B-COMPLEX/B-12 PO) Take by mouth.    . Black Pepper-Turmeric (TURMERIC COMPLEX/BLACK PEPPER PO) Take by mouth.    Marland Kitchen CALCIUM-MAGNESIUM-ZINC PO Take 1 tablet by mouth daily.     . Cholecalciferol (VITAMIN D) 2000 UNITS tablet Take 2,000 Units by mouth daily.    . Cyanocobalamin (VITAMIN B 12 PO) Take 1 tablet by mouth daily.    Marland Kitchen dexlansoprazole (DEXILANT) 60 MG capsule Take 60 mg by mouth daily.    Marland Kitchen loratadine (CLARITIN) 10 MG tablet Take 10 mg by mouth daily.    Marland Kitchen losartan-hydrochlorothiazide (HYZAAR) 100-12.5 MG per tablet     . Omega-3 Fatty Acids (FISH OIL) 1000 MG CAPS Take by mouth 2 (two) times daily.    . polyethylene glycol (MIRALAX / GLYCOLAX) packet Take 17 g by mouth daily.    . Probiotic Product (PROBIOTIC DAILY PO) Take 1 capsule by mouth daily.    Marland Kitchen pyridOXINE (VITAMIN B-6) 100 MG tablet Take 100 mg by mouth daily.     . Red Yeast Rice 600 MG CAPS Take 1 capsule by mouth every evening.      No current facility-administered medications for this visit.     PHYSICAL EXAMINATION: ECOG PERFORMANCE STATUS: 0 - Asymptomatic  Vitals:   01/18/17 0810  BP: 136/83  Pulse: 66  Resp: 18  Temp: 98.6 F (37 C)   Filed Weights   01/18/17 0810  Weight: 185 lb 6.4 oz (84.1 kg)    GENERAL:alert, no distress and comfortable SKIN: skin color, texture, turgor are normal, no rashes or significant lesions EYES: normal, Conjunctiva are pink and non-injected, sclera clear OROPHARYNX:no exudate, no erythema and  lips, buccal mucosa, and tongue normal  NECK: supple, thyroid normal size, non-tender, without nodularity LYMPH:  no palpable lymphadenopathy in the cervical, axillary or inguinal LUNGS: clear to auscultation and percussion with normal breathing effort HEART: regular rate & rhythm and no murmurs and no lower extremity edema ABDOMEN:abdomen soft, non-tender and normal bowel sounds Musculoskeletal:no cyanosis of digits and no clubbing  NEURO: alert & oriented x 3 with fluent speech, no focal motor/sensory deficits  LABORATORY DATA:  I have reviewed the data as listed    Component Value Date/Time   NA 140 10/26/2016 1308   K 3.6 10/26/2016 1308   CL 104 09/10/2014 1012   CO2 25 10/26/2016 1308   GLUCOSE 89 10/26/2016 1308   BUN 17.7 10/26/2016 1308   CREATININE  1.0 10/26/2016 1308   CALCIUM 9.4 10/26/2016 1308   PROT 7.0 10/26/2016 1308   ALBUMIN 4.2 10/26/2016 1308   AST 23 10/26/2016 1308   ALT 17 10/26/2016 1308   ALKPHOS 63 10/26/2016 1308   BILITOT 1.09 10/26/2016 1308   GFRNONAA 66 (L) 09/10/2014 1012   GFRAA 76 (L) 09/10/2014 1012    No results found for: SPEP, UPEP  Lab Results  Component Value Date   WBC 4.3 01/18/2017   NEUTROABS 2.6 01/18/2017   HGB 13.4 01/18/2017   HCT 39.9 01/18/2017   MCV 85.4 01/18/2017   PLT 166 01/18/2017      Chemistry      Component Value Date/Time   NA 140 10/26/2016 1308   K 3.6 10/26/2016 1308   CL 104 09/10/2014 1012   CO2 25 10/26/2016 1308   BUN 17.7 10/26/2016 1308   CREATININE 1.0 10/26/2016 1308      Component Value Date/Time   CALCIUM 9.4 10/26/2016 1308   ALKPHOS 63 10/26/2016 1308   AST 23 10/26/2016 1308   ALT 17 10/26/2016 1308   BILITOT 1.09 10/26/2016 1308      ASSESSMENT & PLAN:  History of B-cell lymphoma The patient is a long-term survivor. She is doing very well and clinically has no evidence of cancer. I do not recommend routine imaging study.  I will see her back in one year with history,  physical examination and blood work We discussed the importance of weight loss and exercise.  [facility]  Essential hypertension, benign Her dermatologist felt that 1 of the blood pressure agent might be the cause of her recurrent skin rashes Her blood pressure is stable currently.  I would defer to primary doctor for medical management   No orders of the defined types were placed in this encounter.  All questions were answered. The patient knows to call the clinic with any problems, questions or concerns. No barriers to learning was detected. I spent 10 minutes counseling the patient face to face. The total time spent in the appointment was 15 minutes and more than 50% was on counseling and review of test results     Heath Lark, MD 01/18/2017 8:31 AM

## 2017-01-18 NOTE — Addendum Note (Signed)
Addended by: Flo Shanks on: 01/18/2017 09:16 AM   Modules accepted: Orders

## 2017-01-18 NOTE — Assessment & Plan Note (Signed)
Her dermatologist felt that 1 of the blood pressure agent might be the cause of her recurrent skin rashes Her blood pressure is stable currently.  I would defer to primary doctor for medical management

## 2017-01-25 ENCOUNTER — Encounter (HOSPITAL_COMMUNITY): Payer: Self-pay

## 2017-01-25 ENCOUNTER — Emergency Department (HOSPITAL_COMMUNITY)
Admission: EM | Admit: 2017-01-25 | Discharge: 2017-01-25 | Disposition: A | Discharge status: Home / Self Care | Payer: Medicare Other | Attending: Emergency Medicine | Admitting: Emergency Medicine

## 2017-01-25 ENCOUNTER — Emergency Department (HOSPITAL_COMMUNITY): Payer: Medicare Other

## 2017-01-25 DIAGNOSIS — Z8505 Personal history of malignant neoplasm of liver: Secondary | ICD-10-CM | POA: Diagnosis not present

## 2017-01-25 DIAGNOSIS — K219 Gastro-esophageal reflux disease without esophagitis: Secondary | ICD-10-CM | POA: Insufficient documentation

## 2017-01-25 DIAGNOSIS — Z7982 Long term (current) use of aspirin: Secondary | ICD-10-CM | POA: Diagnosis not present

## 2017-01-25 DIAGNOSIS — I10 Essential (primary) hypertension: Secondary | ICD-10-CM | POA: Insufficient documentation

## 2017-01-25 DIAGNOSIS — R0789 Other chest pain: Secondary | ICD-10-CM | POA: Diagnosis present

## 2017-01-25 DIAGNOSIS — R079 Chest pain, unspecified: Secondary | ICD-10-CM | POA: Diagnosis not present

## 2017-01-25 DIAGNOSIS — Z79899 Other long term (current) drug therapy: Secondary | ICD-10-CM | POA: Diagnosis not present

## 2017-01-25 LAB — BASIC METABOLIC PANEL
ANION GAP: 9 (ref 5–15)
BUN: 20 mg/dL (ref 6–20)
CHLORIDE: 104 mmol/L (ref 101–111)
CO2: 27 mmol/L (ref 22–32)
Calcium: 9.6 mg/dL (ref 8.9–10.3)
Creatinine, Ser: 1.04 mg/dL — ABNORMAL HIGH (ref 0.44–1.00)
GFR calc Af Amer: >60 mL/min (ref >60)
GFR, EST NON AFRICAN AMERICAN: 53 mL/min — AB (ref >60)
GLUCOSE: 108 mg/dL — AB (ref 65–99)
POTASSIUM: 3.7 mmol/L (ref 3.5–5.1)
Sodium: 140 mmol/L (ref 135–145)

## 2017-01-25 LAB — CBC
HEMATOCRIT: 39.9 % (ref 36.0–46.0)
HEMOGLOBIN: 13.5 g/dL (ref 12.0–15.0)
MCH: 28.6 pg (ref 26.0–34.0)
MCHC: 33.8 g/dL (ref 30.0–36.0)
MCV: 84.5 fL (ref 78.0–100.0)
Platelets: 185 10*3/uL (ref 150–400)
RBC: 4.72 MIL/uL (ref 3.87–5.11)
RDW: 14.1 % (ref 11.5–15.5)
WBC: 4 10*3/uL (ref 4.0–10.5)

## 2017-01-25 LAB — I-STAT TROPONIN, ED: TROPONIN I, POC: 0 ng/mL (ref 0.00–0.08)

## 2017-01-25 NOTE — ED Triage Notes (Signed)
Pt reports woke up at 3 am with sharp pain in center of chest that radiated to r breast and through her back.  Reports episode lasted approx 15-20 min and went away.  Pt was riding in the car a few min ago and pain returned.  Pt took 2 tums and drank a pepsi and pain subsided.  Denies any sob, n/v, or diaphoresis.

## 2017-01-25 NOTE — ED Notes (Signed)
Patient transported to X-ray 

## 2017-01-25 NOTE — ED Provider Notes (Signed)
[facility] DEPT Provider Note   CSN: [id number] Arrival date & time: 01/25/17  1331     History   Chief Complaint Chief Complaint  Patient presents with  . Chest Pain    HPI [name] is a 71 y.o. female.  She presents for evaluation of 2 episodes of chest discomfort, first occurring morning waking her from sleep, it improved when she sat up in a chair for a couple of hours.  There was no associated diaphoresis, nausea, vomiting, shortness of breath, weakness or dizziness.  She had another brief episode while riding in a car today.  She saw her GI doctor 3 weeks ago and was advised to start famotidine twice a day.  She has been taking it sporadically.  She had been using Tums prior to seeing her GI doctor.  Did not use any Tums today.  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  There has been no other finding for coronary disease, MI or heart failure.  There are no other known modifying factors.   HPI  Past Medical History:  Diagnosis Date  . 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 Congenital coronary artery anomaly   . Enlargement of lymph nodes 07/26/2011  . Follicular lymphoma ([place]) 06/27/2013  . 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  B-cell, favor follicular NHL.  CD79a, CD20 and CD10 positive.  Cyclin D1 negative.   Marland Kitchen GERD (gastroesophageal reflux disease)   . Hemorrhoids 08/05/2013  . HTN (hypertension)   . Hyperlipidemia   . Leukopenia 09/16/2013  . Lichen planus   . Thyroid disease   . UTI (urinary tract infection) 11/10/2013    Patient Active Problem List   Diagnosis Date Noted  . Quality of life palliative care encounter 01/19/2016  . Hepatic steatosis 10/20/2015  . Leukopenia 08/19/2015  . Preventative health care 06/23/2015  . Nausea without vomiting 05/12/2015  . Dysuria 05/10/2015  . 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 Skin lesion of left leg 02/18/2014  . Broken tooth 10/17/2013  . Syncope 10/14/2013  . Skull deformity 10/08/2013  . Lichen sclerosus 08/65/7846  . Pancytopenia due to antineoplastic chemotherapy ([place]) 09/16/2013  . 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 Hemorrhoids 08/05/2013  . Insomnia 07/25/2013  . 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 History of B-cell lymphoma 09/25/2011  . Renal mass, left 08/07/2011  . Liver mass, right lobe 07/27/2011  . OVERWEIGHT 12/27/2009  . CRAMP OF LIMB 11/16/2008  . HYPERLIPIDEMIA 11/15/2008  . Essential hypertension, benign 11/15/2008  . CORONARY ARTERY ANOMALY, CONGENITAL 11/15/2008  . KNEE PAIN 03/30/2008  . ANSERINE BURSITIS 03/30/2008  . FINGER SPRAIN 01/06/2008    Past Surgical History:  Procedure Laterality Date  . ABDOMINAL HYSTERECTOMY    . BONE MARROW ASPIRATION  05/09/2013  . BONE MARROW BIOPSY  05/09/2013  . KNEE ARTHROSCOPY W/ DEBRIDEMENT  05/2011   right  . LYMPH NODE DISSECTION  x 3 different times   behind left ear 2012/ left clavical area  . MR BREAST BILATERAL  cyst removal  . TUBAL LIGATION      OB History    No data available       Home Medications    Prior to Admission medications   Medication Sig Start Date End Date Taking? Authorizing Provider  acetaminophen (TYLENOL) 500 MG  tablet Take 250 mg by mouth every 6 (six) hours as needed for moderate pain.    [provider]  aspirin 81 MG tablet Take 81 mg by mouth at bedtime.     [provider]  B Complex Vitamins (B-COMPLEX/B-12 PO) Take by mouth.    [provider]  Black Pepper-Turmeric (TURMERIC COMPLEX/BLACK PEPPER PO) Take by mouth.    [provider]  CALCIUM-MAGNESIUM-ZINC PO Take 1 tablet by mouth daily.     [provider]  carvedilol (COREG) 3.125 MG tablet Take 3.125 mg by mouth daily. 12/26/16   [provider]  Cholecalciferol  (VITAMIN D) 2000 UNITS tablet Take 2,000 Units by mouth daily.    [provider]  clobetasol cream (TEMOVATE) 9.93 % Apply 1 application topically 2 (two) times daily.    [provider]  Cyanocobalamin (VITAMIN B 12 PO) Take 1 tablet by mouth daily.    [provider]  famotidine (PEPCID) 20 MG tablet Take 20 mg by mouth 2 (two) times daily. 11/21/16   [provider]  loratadine (CLARITIN) 10 MG tablet Take 10 mg by mouth daily.    [provider]  Omega-3 Fatty Acids (FISH OIL) 1000 MG CAPS Take by mouth 2 (two) times daily.    [provider]  polyethylene glycol (MIRALAX / GLYCOLAX) packet Take 17 g by mouth daily.    [provider]  Probiotic Product (PROBIOTIC DAILY PO) Take 1 capsule by mouth daily.    [provider]  pyridOXINE (VITAMIN B-6) 100 MG tablet Take 100 mg by mouth daily.     [provider]  spironolactone (ALDACTONE) 25 MG tablet Take 25 mg by mouth daily. 12/13/16   [provider]    Family History Family History  Problem Relation Age of Onset  . Cancer Mother        breast  . Stroke Mother   . Cancer Father        pancreatic cancer  . Heart disease Father   . Cancer Brother        kidney cancer/brain tumor  . Heart disease Brother   . Coronary artery disease Brother     Social History Social History  Substance Use Topics  . Smoking status: Never Smoker  . Smokeless tobacco: Never Used  . Alcohol use No     Allergies   Latex   Review of Systems Review of Systems  All other systems reviewed and are negative.    Physical Exam Updated Vital Signs BP (!) 158/81 (BP Location: Right Arm)   Pulse 66   Temp 98 F (36.7 C) (Oral)   Resp 18   Ht 5' 2"  (1.575 m)   Wt 185 lb (83.9 kg)   SpO2 98%   BMI 33.84 kg/m   Physical Exam  Constitutional: She is oriented to person, place, and time. She appears well-developed. No distress.  Elderly, vigorous  HENT:    Head: Normocephalic and atraumatic.  Eyes: Conjunctivae and EOM are normal. Pupils are equal, round, and reactive to light.  Neck: Normal range of motion and phonation normal. Neck supple.  Cardiovascular: Normal rate and regular rhythm.   Pulmonary/Chest: Effort normal and breath sounds normal. She exhibits no tenderness.  Abdominal: Soft. She exhibits no distension. There is no tenderness. There is no guarding.  Musculoskeletal: Normal range of motion.  Neurological: She is alert and oriented to person, place, and time. She exhibits normal muscle tone.  Skin: Skin is  warm and dry.  Psychiatric: She has a normal mood and affect. Her behavior is normal. Judgment and thought content normal.  Nursing note and vitals reviewed.    ED Treatments / Results  Labs (all labs ordered are listed, but only abnormal results are displayed) Labs Reviewed  BASIC METABOLIC PANEL - Abnormal; Notable for the following:       Result Value   Glucose, Bld 108 (*)    Creatinine, Ser 1.04 (*)    GFR calc non Af Amer 53 (*)    All other components within normal limits  CBC  I-STAT TROPOININ, ED    EKG  EKG Interpretation None       Radiology Dg Chest 2 View  Result Date: 01/25/2017 CLINICAL DATA:  Chest pain. EXAM: CHEST  2 VIEW COMPARISON:  11/03/2015. FINDINGS: Mediastinum and hilar structures normal. Low lung volumes with mild basilar atelectasis. Heart size normal. No pleural effusion or pneumothorax. Surgical clips left apex. IMPRESSION: Low lung volumes with mild basilar atelectasis. Electronically Signed   By: Marcello Moores  Register   On: 01/25/2017 14:22    Procedures Procedures (including critical care time)  Medications Ordered in ED Medications - No data to display   Initial Impression / Assessment and Plan / ED Course  I have reviewed the triage vital signs and the nursing notes.  Pertinent labs & imaging results that were available during my care of the patient were reviewed by me  and considered in my medical decision making (see chart for details).     Medications - No data to display  Patient Vitals for the past 24 hrs:  BP Temp Temp src Pulse Resp SpO2 Height Weight  01/25/17 1502 (!) 145/79 97.8 F (36.6 C) Oral (!) 58 18 97 % - -  01/25/17 1341 (!) 158/81 98 F (36.7 C) Oral 66 18 98 % - -  01/25/17 1340 - - - - - - 5' 2"  (1.575 m) 185 lb (83.9 kg)    3:03 PM Reevaluation with update and discussion. After initial assessment and treatment, an updated evaluation reveals patient remains comfortable has not had any more chest pain.  Findings discussed with the patient and her husband, all questions were answered. Beulah Matusek L    Final Clinical Impressions(s) / ED Diagnoses   Final diagnoses:  Gastroesophageal reflux disease, esophagitis presence not specified     Patient is consistent with esophageal reflux, and reassuring examination, for cardiac chest pain.  Doubt ACS, PE or pneumonia.  Nursing Notes Reviewed/ Care Coordinated Applicable Imaging Reviewed Interpretation of Laboratory Data incorporated into ED treatment  The patient appears reasonably screened and/or stabilized for discharge and I doubt any other medical condition or other [employer] requiring further screening, evaluation, or treatment in the ED at this time prior to discharge.  Plan: Home Medications-use oral antacid, as needed.; Home Treatments-rest, consider elevating head of bed.; return here if the recommended treatment, does not improve the symptoms; Recommended follow up-PCP as needed, and GI as needed for additional treatment.  New Prescriptions New Prescriptions   No medications on file     Daleen Bo, MD 01/25/17 1504

## 2017-01-25 NOTE — Discharge Instructions (Signed)
Use Tums, in addition to your famotidine, to help control symptoms.

## 2017-01-31 DIAGNOSIS — Z6833 Body mass index (BMI) 33.0-33.9, adult: Secondary | ICD-10-CM | POA: Diagnosis not present

## 2017-01-31 DIAGNOSIS — I10 Essential (primary) hypertension: Secondary | ICD-10-CM | POA: Diagnosis not present

## 2017-02-12 DIAGNOSIS — R05 Cough: Secondary | ICD-10-CM | POA: Diagnosis not present

## 2017-02-12 DIAGNOSIS — R0982 Postnasal drip: Secondary | ICD-10-CM | POA: Diagnosis not present

## 2017-02-12 DIAGNOSIS — J301 Allergic rhinitis due to pollen: Secondary | ICD-10-CM | POA: Diagnosis not present

## 2017-02-26 DIAGNOSIS — I10 Essential (primary) hypertension: Secondary | ICD-10-CM | POA: Diagnosis not present

## 2017-03-19 DIAGNOSIS — I10 Essential (primary) hypertension: Secondary | ICD-10-CM | POA: Diagnosis not present

## 2017-03-19 DIAGNOSIS — R5383 Other fatigue: Secondary | ICD-10-CM | POA: Diagnosis not present

## 2017-03-19 DIAGNOSIS — R001 Bradycardia, unspecified: Secondary | ICD-10-CM | POA: Diagnosis not present

## 2017-03-23 DIAGNOSIS — B351 Tinea unguium: Secondary | ICD-10-CM | POA: Diagnosis not present

## 2017-03-23 DIAGNOSIS — M79674 Pain in right toe(s): Secondary | ICD-10-CM | POA: Diagnosis not present

## 2017-03-26 DIAGNOSIS — Z634 Disappearance and death of family member: Secondary | ICD-10-CM | POA: Diagnosis not present

## 2017-03-26 DIAGNOSIS — I10 Essential (primary) hypertension: Secondary | ICD-10-CM | POA: Diagnosis not present

## 2017-04-24 DIAGNOSIS — H1132 Conjunctival hemorrhage, left eye: Secondary | ICD-10-CM | POA: Diagnosis not present

## 2017-05-03 ENCOUNTER — Ambulatory Visit: Payer: Medicare Other | Admitting: Hematology and Oncology

## 2017-05-03 ENCOUNTER — Other Ambulatory Visit: Payer: Medicare Other

## 2017-05-03 DIAGNOSIS — Z0000 Encounter for general adult medical examination without abnormal findings: Secondary | ICD-10-CM | POA: Diagnosis not present

## 2017-05-08 DIAGNOSIS — M79672 Pain in left foot: Secondary | ICD-10-CM | POA: Diagnosis not present

## 2017-05-09 DIAGNOSIS — N390 Urinary tract infection, site not specified: Secondary | ICD-10-CM | POA: Diagnosis not present

## 2017-05-09 DIAGNOSIS — N8110 Cystocele, unspecified: Secondary | ICD-10-CM | POA: Diagnosis not present

## 2017-05-09 DIAGNOSIS — M79605 Pain in left leg: Secondary | ICD-10-CM | POA: Diagnosis not present

## 2017-05-09 DIAGNOSIS — N399 Disorder of urinary system, unspecified: Secondary | ICD-10-CM | POA: Diagnosis not present

## 2017-05-09 DIAGNOSIS — N810 Urethrocele: Secondary | ICD-10-CM | POA: Diagnosis not present

## 2017-05-09 DIAGNOSIS — N816 Rectocele: Secondary | ICD-10-CM | POA: Diagnosis not present

## 2017-05-09 DIAGNOSIS — M25552 Pain in left hip: Secondary | ICD-10-CM | POA: Diagnosis not present

## 2017-05-09 DIAGNOSIS — M25559 Pain in unspecified hip: Secondary | ICD-10-CM | POA: Diagnosis not present

## 2017-05-15 ENCOUNTER — Other Ambulatory Visit: Payer: Self-pay | Admitting: Family Medicine

## 2017-05-16 ENCOUNTER — Other Ambulatory Visit: Payer: Self-pay | Admitting: Family Medicine

## 2017-05-16 DIAGNOSIS — M79605 Pain in left leg: Secondary | ICD-10-CM | POA: Diagnosis not present

## 2017-05-16 DIAGNOSIS — M25552 Pain in left hip: Secondary | ICD-10-CM | POA: Diagnosis not present

## 2017-05-22 ENCOUNTER — Other Ambulatory Visit: Payer: Self-pay | Admitting: Family Medicine

## 2017-05-22 DIAGNOSIS — Z1231 Encounter for screening mammogram for malignant neoplasm of breast: Secondary | ICD-10-CM

## 2017-05-25 ENCOUNTER — Other Ambulatory Visit: Payer: Self-pay | Admitting: Family Medicine

## 2017-05-25 DIAGNOSIS — M25552 Pain in left hip: Secondary | ICD-10-CM

## 2017-05-25 DIAGNOSIS — M79605 Pain in left leg: Secondary | ICD-10-CM

## 2017-06-01 DIAGNOSIS — B351 Tinea unguium: Secondary | ICD-10-CM | POA: Diagnosis not present

## 2017-06-01 DIAGNOSIS — M79674 Pain in right toe(s): Secondary | ICD-10-CM | POA: Diagnosis not present

## 2017-06-08 ENCOUNTER — Ambulatory Visit
Admission: RE | Admit: 2017-06-08 | Discharge: 2017-06-08 | Disposition: A | Discharge status: Home / Self Care | Payer: Medicare Other | Source: Ambulatory Visit | Attending: Family Medicine | Admitting: Family Medicine

## 2017-06-08 DIAGNOSIS — M79605 Pain in left leg: Secondary | ICD-10-CM

## 2017-06-08 DIAGNOSIS — M5126 Other intervertebral disc displacement, lumbar region: Secondary | ICD-10-CM | POA: Diagnosis not present

## 2017-06-08 DIAGNOSIS — M7072 Other bursitis of hip, left hip: Secondary | ICD-10-CM | POA: Diagnosis not present

## 2017-06-08 DIAGNOSIS — M25552 Pain in left hip: Secondary | ICD-10-CM

## 2017-06-12 DIAGNOSIS — L308 Other specified dermatitis: Secondary | ICD-10-CM | POA: Diagnosis not present

## 2017-06-12 DIAGNOSIS — L280 Lichen simplex chronicus: Secondary | ICD-10-CM | POA: Diagnosis not present

## 2017-06-15 ENCOUNTER — Ambulatory Visit: Payer: Medicare Other

## 2017-06-18 DIAGNOSIS — E785 Hyperlipidemia, unspecified: Secondary | ICD-10-CM | POA: Diagnosis not present

## 2017-06-18 DIAGNOSIS — M79605 Pain in left leg: Secondary | ICD-10-CM | POA: Diagnosis not present

## 2017-06-18 DIAGNOSIS — M25552 Pain in left hip: Secondary | ICD-10-CM | POA: Diagnosis not present

## 2017-06-18 DIAGNOSIS — I10 Essential (primary) hypertension: Secondary | ICD-10-CM | POA: Diagnosis not present

## 2017-06-18 DIAGNOSIS — Z23 Encounter for immunization: Secondary | ICD-10-CM | POA: Diagnosis not present

## 2017-06-21 ENCOUNTER — Ambulatory Visit
Admission: RE | Admit: 2017-06-21 | Discharge: 2017-06-21 | Disposition: A | Discharge status: Home / Self Care | Payer: Medicare Other | Source: Ambulatory Visit | Attending: Family Medicine | Admitting: Family Medicine

## 2017-06-21 DIAGNOSIS — Z1231 Encounter for screening mammogram for malignant neoplasm of breast: Secondary | ICD-10-CM

## 2017-06-26 DIAGNOSIS — L280 Lichen simplex chronicus: Secondary | ICD-10-CM | POA: Diagnosis not present

## 2017-06-26 DIAGNOSIS — R030 Elevated blood-pressure reading, without diagnosis of hypertension: Secondary | ICD-10-CM | POA: Diagnosis not present

## 2017-07-19 DIAGNOSIS — M48061 Spinal stenosis, lumbar region without neurogenic claudication: Secondary | ICD-10-CM | POA: Diagnosis not present

## 2017-07-19 DIAGNOSIS — E785 Hyperlipidemia, unspecified: Secondary | ICD-10-CM | POA: Diagnosis not present

## 2017-07-19 DIAGNOSIS — C8510 Unspecified B-cell lymphoma, unspecified site: Secondary | ICD-10-CM | POA: Diagnosis not present

## 2017-07-19 DIAGNOSIS — M479 Spondylosis, unspecified: Secondary | ICD-10-CM | POA: Diagnosis not present

## 2017-07-19 DIAGNOSIS — I10 Essential (primary) hypertension: Secondary | ICD-10-CM | POA: Diagnosis not present

## 2017-07-19 DIAGNOSIS — K5790 Diverticulosis of intestine, part unspecified, without perforation or abscess without bleeding: Secondary | ICD-10-CM | POA: Diagnosis not present

## 2017-07-19 DIAGNOSIS — Z7982 Long term (current) use of aspirin: Secondary | ICD-10-CM | POA: Diagnosis not present

## 2017-07-19 DIAGNOSIS — C44729 Squamous cell carcinoma of skin of left lower limb, including hip: Secondary | ICD-10-CM | POA: Diagnosis not present

## 2017-07-23 DIAGNOSIS — I10 Essential (primary) hypertension: Secondary | ICD-10-CM | POA: Diagnosis not present

## 2017-07-23 DIAGNOSIS — E785 Hyperlipidemia, unspecified: Secondary | ICD-10-CM | POA: Diagnosis not present

## 2017-07-23 DIAGNOSIS — C8510 Unspecified B-cell lymphoma, unspecified site: Secondary | ICD-10-CM | POA: Diagnosis not present

## 2017-07-23 DIAGNOSIS — M48061 Spinal stenosis, lumbar region without neurogenic claudication: Secondary | ICD-10-CM | POA: Diagnosis not present

## 2017-07-23 DIAGNOSIS — C44729 Squamous cell carcinoma of skin of left lower limb, including hip: Secondary | ICD-10-CM | POA: Diagnosis not present

## 2017-07-23 DIAGNOSIS — M479 Spondylosis, unspecified: Secondary | ICD-10-CM | POA: Diagnosis not present

## 2017-07-25 DIAGNOSIS — M48061 Spinal stenosis, lumbar region without neurogenic claudication: Secondary | ICD-10-CM | POA: Diagnosis not present

## 2017-07-25 DIAGNOSIS — C8510 Unspecified B-cell lymphoma, unspecified site: Secondary | ICD-10-CM | POA: Diagnosis not present

## 2017-07-25 DIAGNOSIS — I10 Essential (primary) hypertension: Secondary | ICD-10-CM | POA: Diagnosis not present

## 2017-07-25 DIAGNOSIS — E785 Hyperlipidemia, unspecified: Secondary | ICD-10-CM | POA: Diagnosis not present

## 2017-07-25 DIAGNOSIS — M479 Spondylosis, unspecified: Secondary | ICD-10-CM | POA: Diagnosis not present

## 2017-07-25 DIAGNOSIS — C44729 Squamous cell carcinoma of skin of left lower limb, including hip: Secondary | ICD-10-CM | POA: Diagnosis not present

## 2017-07-26 DIAGNOSIS — M25552 Pain in left hip: Secondary | ICD-10-CM | POA: Diagnosis not present

## 2017-07-26 DIAGNOSIS — I10 Essential (primary) hypertension: Secondary | ICD-10-CM | POA: Diagnosis not present

## 2017-07-27 ENCOUNTER — Other Ambulatory Visit (HOSPITAL_COMMUNITY): Payer: Self-pay | Admitting: Family Medicine

## 2017-07-27 ENCOUNTER — Ambulatory Visit (HOSPITAL_COMMUNITY)
Admission: RE | Admit: 2017-07-27 | Discharge: 2017-07-27 | Disposition: A | Discharge status: Home / Self Care | Payer: Medicare Other | Source: Ambulatory Visit | Attending: Family Medicine | Admitting: Family Medicine

## 2017-07-27 DIAGNOSIS — M25552 Pain in left hip: Secondary | ICD-10-CM

## 2017-07-30 DIAGNOSIS — M479 Spondylosis, unspecified: Secondary | ICD-10-CM | POA: Diagnosis not present

## 2017-07-30 DIAGNOSIS — M48061 Spinal stenosis, lumbar region without neurogenic claudication: Secondary | ICD-10-CM | POA: Diagnosis not present

## 2017-07-30 DIAGNOSIS — E785 Hyperlipidemia, unspecified: Secondary | ICD-10-CM | POA: Diagnosis not present

## 2017-07-30 DIAGNOSIS — I10 Essential (primary) hypertension: Secondary | ICD-10-CM | POA: Diagnosis not present

## 2017-07-30 DIAGNOSIS — C44729 Squamous cell carcinoma of skin of left lower limb, including hip: Secondary | ICD-10-CM | POA: Diagnosis not present

## 2017-07-30 DIAGNOSIS — C8510 Unspecified B-cell lymphoma, unspecified site: Secondary | ICD-10-CM | POA: Diagnosis not present

## 2017-08-06 DIAGNOSIS — M48061 Spinal stenosis, lumbar region without neurogenic claudication: Secondary | ICD-10-CM | POA: Diagnosis not present

## 2017-08-06 DIAGNOSIS — M479 Spondylosis, unspecified: Secondary | ICD-10-CM | POA: Diagnosis not present

## 2017-08-06 DIAGNOSIS — E785 Hyperlipidemia, unspecified: Secondary | ICD-10-CM | POA: Diagnosis not present

## 2017-08-06 DIAGNOSIS — C8510 Unspecified B-cell lymphoma, unspecified site: Secondary | ICD-10-CM | POA: Diagnosis not present

## 2017-08-06 DIAGNOSIS — I10 Essential (primary) hypertension: Secondary | ICD-10-CM | POA: Diagnosis not present

## 2017-08-06 DIAGNOSIS — C44729 Squamous cell carcinoma of skin of left lower limb, including hip: Secondary | ICD-10-CM | POA: Diagnosis not present

## 2017-08-08 DIAGNOSIS — C44729 Squamous cell carcinoma of skin of left lower limb, including hip: Secondary | ICD-10-CM | POA: Diagnosis not present

## 2017-08-08 DIAGNOSIS — M48061 Spinal stenosis, lumbar region without neurogenic claudication: Secondary | ICD-10-CM | POA: Diagnosis not present

## 2017-08-08 DIAGNOSIS — M479 Spondylosis, unspecified: Secondary | ICD-10-CM | POA: Diagnosis not present

## 2017-08-08 DIAGNOSIS — C8510 Unspecified B-cell lymphoma, unspecified site: Secondary | ICD-10-CM | POA: Diagnosis not present

## 2017-08-08 DIAGNOSIS — I10 Essential (primary) hypertension: Secondary | ICD-10-CM | POA: Diagnosis not present

## 2017-08-08 DIAGNOSIS — E785 Hyperlipidemia, unspecified: Secondary | ICD-10-CM | POA: Diagnosis not present

## 2017-08-17 DIAGNOSIS — M79674 Pain in right toe(s): Secondary | ICD-10-CM | POA: Diagnosis not present

## 2017-08-17 DIAGNOSIS — B351 Tinea unguium: Secondary | ICD-10-CM | POA: Diagnosis not present

## 2017-08-27 DIAGNOSIS — R05 Cough: Secondary | ICD-10-CM | POA: Diagnosis not present

## 2017-08-27 DIAGNOSIS — R0982 Postnasal drip: Secondary | ICD-10-CM | POA: Diagnosis not present

## 2017-08-31 DIAGNOSIS — M170 Bilateral primary osteoarthritis of knee: Secondary | ICD-10-CM | POA: Diagnosis not present

## 2017-09-05 ENCOUNTER — Telehealth: Payer: Self-pay | Admitting: Hematology and Oncology

## 2017-09-05 NOTE — Telephone Encounter (Signed)
Patient wanted to see Dr per Hassan Rowan schedule first available

## 2017-09-06 DIAGNOSIS — I10 Essential (primary) hypertension: Secondary | ICD-10-CM | POA: Diagnosis not present

## 2017-09-06 DIAGNOSIS — R05 Cough: Secondary | ICD-10-CM | POA: Diagnosis not present

## 2017-09-07 ENCOUNTER — Other Ambulatory Visit (HOSPITAL_COMMUNITY): Payer: Self-pay | Admitting: Family Medicine

## 2017-09-07 DIAGNOSIS — R600 Localized edema: Secondary | ICD-10-CM

## 2017-09-10 ENCOUNTER — Ambulatory Visit (HOSPITAL_COMMUNITY)
Admission: RE | Admit: 2017-09-10 | Discharge: 2017-09-10 | Disposition: A | Discharge status: Home / Self Care | Payer: Medicare Other | Source: Ambulatory Visit | Attending: Family Medicine | Admitting: Family Medicine

## 2017-09-10 DIAGNOSIS — M7122 Synovial cyst of popliteal space [Baker], left knee: Secondary | ICD-10-CM | POA: Insufficient documentation

## 2017-09-10 DIAGNOSIS — R600 Localized edema: Secondary | ICD-10-CM | POA: Diagnosis not present

## 2017-09-25 DIAGNOSIS — J069 Acute upper respiratory infection, unspecified: Secondary | ICD-10-CM | POA: Diagnosis not present

## 2017-09-27 DIAGNOSIS — L439 Lichen planus, unspecified: Secondary | ICD-10-CM | POA: Diagnosis not present

## 2017-09-28 ENCOUNTER — Telehealth: Payer: Self-pay | Admitting: Hematology and Oncology

## 2017-09-28 NOTE — Telephone Encounter (Signed)
Returned call to patient re her having flu and wondering if she should keep 1/21 appointment. Spoke with patient and gave her new appointment for 1/31. Message routed to NG.

## 2017-09-28 NOTE — Telephone Encounter (Signed)
thanks

## 2017-10-01 ENCOUNTER — Ambulatory Visit: Payer: Medicare Other | Admitting: Hematology and Oncology

## 2017-10-08 DIAGNOSIS — J09X2 Influenza due to identified novel influenza A virus with other respiratory manifestations: Secondary | ICD-10-CM | POA: Diagnosis not present

## 2017-10-11 ENCOUNTER — Inpatient Hospital Stay: Payer: Medicare Other | Attending: Hematology and Oncology | Admitting: Hematology and Oncology

## 2017-10-11 ENCOUNTER — Encounter: Payer: Self-pay | Admitting: Hematology and Oncology

## 2017-10-11 ENCOUNTER — Telehealth: Payer: Self-pay | Admitting: Hematology and Oncology

## 2017-10-11 ENCOUNTER — Inpatient Hospital Stay: Payer: Medicare Other

## 2017-10-11 VITALS — BP 165/87 | HR 72 | Temp 98.4°F | Resp 18 | Wt 182.2 lb

## 2017-10-11 DIAGNOSIS — G8929 Other chronic pain: Secondary | ICD-10-CM | POA: Insufficient documentation

## 2017-10-11 DIAGNOSIS — Z8579 Personal history of other malignant neoplasms of lymphoid, hematopoietic and related tissues: Secondary | ICD-10-CM

## 2017-10-11 DIAGNOSIS — M549 Dorsalgia, unspecified: Secondary | ICD-10-CM

## 2017-10-11 DIAGNOSIS — C44729 Squamous cell carcinoma of skin of left lower limb, including hip: Secondary | ICD-10-CM | POA: Insufficient documentation

## 2017-10-11 DIAGNOSIS — Z8572 Personal history of non-Hodgkin lymphomas: Secondary | ICD-10-CM | POA: Insufficient documentation

## 2017-10-11 DIAGNOSIS — I10 Essential (primary) hypertension: Secondary | ICD-10-CM | POA: Diagnosis not present

## 2017-10-11 HISTORY — DX: Dorsalgia, unspecified: M54.9

## 2017-10-11 HISTORY — DX: Other chronic pain: G89.29

## 2017-10-11 LAB — CBC WITH DIFFERENTIAL/PLATELET
BASOS ABS: 0.1 10*3/uL (ref 0.0–0.1)
Basophils Relative: 1 %
EOS ABS: 0.2 10*3/uL (ref 0.0–0.5)
EOS PCT: 4 %
HCT: 38.1 % (ref 34.8–46.6)
Hemoglobin: 12.8 g/dL (ref 11.6–15.9)
Lymphocytes Relative: 30 %
Lymphs Abs: 1.5 10*3/uL (ref 0.9–3.3)
MCH: 28 pg (ref 25.1–34.0)
MCHC: 33.7 g/dL (ref 31.5–36.0)
MCV: 83.2 fL (ref 79.5–101.0)
Monocytes Absolute: 0.3 10*3/uL (ref 0.1–0.9)
Monocytes Relative: 6 %
Neutro Abs: 2.8 10*3/uL (ref 1.5–6.5)
Neutrophils Relative %: 59 %
PLATELETS: 209 10*3/uL (ref 145–400)
RBC: 4.58 MIL/uL (ref 3.70–5.45)
RDW: 15.3 % — ABNORMAL HIGH (ref 11.2–14.5)
WBC: 4.8 10*3/uL (ref 3.9–10.3)

## 2017-10-11 LAB — COMPREHENSIVE METABOLIC PANEL
ALT: 26 U/L (ref 0–55)
AST: 27 U/L (ref 5–34)
Albumin: 4.3 g/dL (ref 3.5–5.0)
Alkaline Phosphatase: 67 U/L (ref 40–150)
Anion gap: 10 (ref 3–11)
BILIRUBIN TOTAL: 0.7 mg/dL (ref 0.2–1.2)
BUN: 16 mg/dL (ref 7–26)
CO2: 28 mmol/L (ref 22–29)
CREATININE: 0.92 mg/dL (ref 0.60–1.10)
Calcium: 9.6 mg/dL (ref 8.4–10.4)
Chloride: 105 mmol/L (ref 98–109)
GFR calc Af Amer: >60 mL/min (ref >60)
Glucose, Bld: 92 mg/dL (ref 70–140)
Potassium: 3.5 mmol/L (ref 3.5–5.1)
Sodium: 143 mmol/L (ref 136–145)
TOTAL PROTEIN: 6.9 g/dL (ref 6.4–8.3)

## 2017-10-11 LAB — SEDIMENTATION RATE: SED RATE: 9 mm/h (ref 0–22)

## 2017-10-11 LAB — LACTATE DEHYDROGENASE: LDH: 215 U/L (ref 125–245)

## 2017-10-11 NOTE — Assessment & Plan Note (Signed)
She has high risk disease She complained of symptomatic worsening back pain and discomfort in her left groin The patient is obese and I am unable to appreciate any new lymphadenopathy However, due to her high risk disease, I recommend CT scan of the chest, abdomen and pelvis along with blood work for further evaluation and she agreed to proceed

## 2017-10-11 NOTE — Assessment & Plan Note (Signed)
She has multiple skin lesions I would defer to her dermatologist for management

## 2017-10-11 NOTE — Progress Notes (Signed)
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 Needle biopsy was inconclusive.        08/02/2011 Imaging    CT scan of the chest abdomen and pelvis showed numerous lymphadenopathy above and below the diaphragm.       08/16/2011 Imaging    MRI of the abdomen revealed enlarging left kidney lesion worrisome for malignancy      09/20/2011 Initial Diagnosis    Lymphoma, low grade from kidney biopsy, follicular      03/17/2375 Procedure    Histologic type: Non-Hodgkin's lymphoma, follicular center cell type. Grade (if applicable): Favor high grade, Ki-67 ranges from 10-50%. Immunohistochemical stains: CD20, CD79a, CD10, BCL-2, BCL-6, CD21, CD3, CD43.      05/09/2013 Procedure    BM biopsy is highly suspicious of involvement. Patient had persistent leukopenia      05/09/2013 Imaging    PET/CT showed significant involvement of LN everywhere      07/16/2013 - 11/05/2013 Chemotherapy    She completed 6 cycles of R. [facility]OP chemotherapy.      07/23/2013 - 07/27/2013 Hospital Admission    Admitted for management of mucositis and neutropenic fever. Blood culture was positive for gram negative rods, resolved with antibiotics      10/08/2013 Adverse Reaction    The dose of vincristine is reduced by 50%      12/15/2013 Imaging    Repeat PET CT scan show complete response to treatment.      12/17/2013 - 10/20/2015 Chemotherapy    She begin maintenance rituximab every other month x 2 years      02/23/2014 Procedure    She had shave biopsy of the left upper, that came back well-differentiated squamous cell carcinoma.      04/29/2014 Imaging    CT scan of the abdomen showed no evidence of disease recurrence.      10/12/2014 Imaging    Repeat CT scan of the chest, abdomen and pelvis showed no evidence of recurrence.      10/19/2015 Imaging    Ct scan showed no evidence of lymphoma      06/08/2017 Imaging    MRI lumbar Transitional lumbosacral anatomy.  L5 incorporated into the sacrum  Mild degenerative change L2-3 without stenosis  Mild anterolisthesis L3-4 with facet degeneration and subarticular stenosis bilaterally  4 mm anterolisthesis L4-5 with facet degeneration and mild subarticular stenosis bilaterally      06/08/2017 Imaging    MRI left hip 1. Left trochanteric bursitis. 2. Abnormal edema in the left quadrate is femoris muscle probably from muscle strain. 3. Axial loss of articular cartilage thickness in both hips with mild associated spurring of the femoral heads. 4. Small amount  of nonspecific free pelvic fluid.       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       INTERVAL HISTORY: Please see below for problem oriented charting. She returns for further follow-up and urgent evaluation due to worsening pain Recently, she was diagnosed with influenza and was successfully treated She is  seen multiple dermatologists for management of multiple squamous cell carcinoma of the skin She complained of acute on chronic lower back pain and left groin pain She is quite debilitated from this She denies recent loss of appetite or weight loss No abnormal night sweats.  REVIEW OF SYSTEMS:   Constitutional: Denies fevers, chills or abnormal weight loss Eyes: Denies blurriness of vision Ears, nose, mouth, throat, and face: Denies mucositis or sore throat Respiratory: Denies cough, dyspnea or wheezes Cardiovascular: Denies palpitation, chest discomfort or lower extremity swelling Gastrointestinal:  Denies nausea, heartburn or change in bowel habits Lymphatics: Denies new lymphadenopathy or easy bruising Neurological:Denies numbness, tingling or new weaknesses Behavioral/Psych: Mood is stable, no new changes  All other systems were reviewed with the patient and are negative.  I have reviewed the past medical history, past surgical history, social history and family history with the patient and they are unchanged from previous note.  ALLERGIES:  is allergic to latex.  MEDICATIONS:  Current Outpatient Medications  Medication Sig Dispense Refill  . amLODipine (NORVASC) 5 MG tablet Take 10 mg by mouth at bedtime.    . hydrochlorothiazide (HYDRODIURIL) 25 MG tablet Take 25 mg by mouth daily.    Marland Kitchen acetaminophen (TYLENOL) 500 MG tablet Take 250 mg by mouth every 6 (six) hours as needed for moderate pain.    Marland Kitchen aspirin 81 MG tablet Take 81 mg by mouth at bedtime.     . B Complex Vitamins (B-COMPLEX/B-12 PO) Take by mouth.    . Black Pepper-Turmeric (TURMERIC COMPLEX/BLACK PEPPER PO) Take by mouth.    Marland Kitchen CALCIUM-MAGNESIUM-ZINC PO Take 1 tablet by mouth daily.     . Cholecalciferol (VITAMIN D) 2000 UNITS tablet Take 2,000 Units by mouth daily.    . clobetasol cream (TEMOVATE) 2.03 % Apply 1 application topically 2 (two) times daily.    . Cyanocobalamin (VITAMIN B 12 PO) Take 1 tablet by mouth  daily.    . famotidine (PEPCID) 20 MG tablet Take 20 mg by mouth 2 (two) times daily.    Marland Kitchen loratadine (CLARITIN) 10 MG tablet Take 10 mg by mouth daily.    . Omega-3 Fatty Acids (FISH OIL) 1000 MG CAPS Take by mouth 2 (two) times daily.    . polyethylene glycol (MIRALAX / GLYCOLAX) packet Take 17 g by mouth daily.    . Probiotic Product (PROBIOTIC DAILY PO) Take 1 capsule by mouth daily.    Marland Kitchen pyridOXINE (VITAMIN B-6) 100 MG tablet Take 100 mg by mouth daily.      No current facility-administered medications for this visit.     PHYSICAL EXAMINATION: ECOG PERFORMANCE STATUS: 2 - Symptomatic, <50% confined to bed  Vitals:   10/11/17 1342  BP: (!) 165/87  Pulse: 72  Resp: 18  Temp: 98.4 F (36.9 C)  SpO2: 100%   Filed Weights  10/11/17 1342  Weight: 182 lb 3 oz (82.6 kg)    GENERAL:alert, no distress and comfortable SKIN: Noted diffuse skin lesions consistent with her skin cancer  eYES: normal, Conjunctiva are pink and non-injected, sclera clear OROPHARYNX:no exudate, no erythema and lips, buccal mucosa, and tongue normal  NECK: supple, thyroid normal size, non-tender, without nodularity LYMPH:  no palpable lymphadenopathy in the cervical, axillary or inguinal LUNGS: clear to auscultation and percussion with normal breathing effort HEART: regular rate & rhythm and no murmurs and no lower extremity edema ABDOMEN:abdomen soft, non-tender and normal bowel sounds Musculoskeletal:no cyanosis of digits and no clubbing  NEURO: alert & oriented x 3 with fluent speech, no focal motor/sensory deficits  LABORATORY DATA:  I have reviewed the data as listed    Component Value Date/Time   NA 140 01/25/2017 1355   NA 140 01/18/2017 0757   K 3.7 01/25/2017 1355   K 4.1 01/18/2017 0757   CL 104 01/25/2017 1355   CO2 27 01/25/2017 1355   CO2 25 01/18/2017 0757   GLUCOSE 108 (H) 01/25/2017 1355   GLUCOSE 94 01/18/2017 0757   BUN 20 01/25/2017 1355   BUN 14.0 01/18/2017 0757    CREATININE 1.04 (H) 01/25/2017 1355   CREATININE 1.2 (H) 01/18/2017 0757   CALCIUM 9.6 01/25/2017 1355   CALCIUM 9.3 01/18/2017 0757   PROT 7.0 01/18/2017 0757   ALBUMIN 4.3 01/18/2017 0757   AST 22 01/18/2017 0757   ALT 19 01/18/2017 0757   ALKPHOS 60 01/18/2017 0757   BILITOT 1.13 01/18/2017 0757   GFRNONAA 53 (L) 01/25/2017 1355   GFRAA >60 01/25/2017 1355    No results found for: SPEP, UPEP  Lab Results  Component Value Date   WBC 4.0 01/25/2017   NEUTROABS 2.6 01/18/2017   HGB 13.5 01/25/2017   HCT 39.9 01/25/2017   MCV 84.5 01/25/2017   PLT 185 01/25/2017      Chemistry      Component Value Date/Time   NA 140 01/25/2017 1355   NA 140 01/18/2017 0757   K 3.7 01/25/2017 1355   K 4.1 01/18/2017 0757   CL 104 01/25/2017 1355   CO2 27 01/25/2017 1355   CO2 25 01/18/2017 0757   BUN 20 01/25/2017 1355   BUN 14.0 01/18/2017 0757   CREATININE 1.04 (H) 01/25/2017 1355   CREATININE 1.2 (H) 01/18/2017 0757      Component Value Date/Time   CALCIUM 9.6 01/25/2017 1355   CALCIUM 9.3 01/18/2017 0757   ALKPHOS 60 01/18/2017 0757   AST 22 01/18/2017 0757   ALT 19 01/18/2017 0757   BILITOT 1.13 01/18/2017 0757       ASSESSMENT & PLAN:  History of B-cell lymphoma She has high risk disease She complained of symptomatic worsening back pain and discomfort in her left groin The patient is obese and I am unable to appreciate any new lymphadenopathy However, due to her high risk disease, I recommend CT scan of the chest, abdomen and pelvis along with blood work for further evaluation and she agreed to proceed  Essential hypertension, benign She has intermittent hypertension, could be attributed to anxiety She will continue medication management and adjustment through her primary care doctor  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  She agreed to proceed   Orders Placed This Encounter  Procedures  . CT ABDOMEN PELVIS W CONTRAST    Standing Status:   Future    Standing Expiration Date:   10/11/2018    Order Specific Question:   If indicated for the ordered procedure, I authorize the administration of contrast media per Radiology protocol    Answer:   Yes    Order Specific Question:   Preferred imaging location?    Answer:   [facility]    Order Specific Question:   Radiology Contrast Protocol - do NOT remove file path    Answer:   \\[email or web address]  . CT CHEST W CONTRAST    Standing Status:   Future    Standing Expiration Date:   10/11/2018    Order Specific Question:   If indicated for the ordered procedure, I authorize the administration of contrast media per Radiology protocol    Answer:   Yes    Order Specific Question:   Preferred imaging location?    Answer:   [facility]    Order Specific Question:   Radiology Contrast Protocol - do NOT remove file path    Answer:   \\[email or web address]  . CBC with Differential/Platelet    Standing Status:   Future    Number of Occurrences:   1    Standing Expiration Date:   11/15/2018  . Comprehensive metabolic panel    Standing Status:   Future    Number of Occurrences:   1    Standing Expiration Date:   11/15/2018  . Lactate dehydrogenase    Standing Status:   Future    Number of Occurrences:   1    Standing Expiration Date:   11/15/2018  . Sedimentation rate    Standing Status:   Future    Number of Occurrences:   1    Standing Expiration Date:   11/15/2018   All questions were answered. The patient knows to call the clinic with any problems, questions or concerns. No barriers  to learning was detected. I spent 25 minutes counseling the patient face to face. The total time spent in the appointment was 30 minutes and more than 50% was on counseling and review of test results     Heath Lark, MD 10/11/2017 2:42 PM

## 2017-10-11 NOTE — Assessment & Plan Note (Signed)
She has intermittent hypertension, could be attributed to anxiety She will continue medication management and adjustment through her primary care doctor

## 2017-10-11 NOTE — Telephone Encounter (Signed)
Gave patient AVs and calendar of upcoming February appointments.  °

## 2017-10-11 NOTE — Assessment & Plan Note (Signed)
She has chronic back pain and is morbidly obese Recent MRI showed degenerative disc disease However, since her symptoms have gotten progressively worse, I need to order CT imaging to exclude new lymphadenopathy as a cause of her pain.  She agreed to proceed

## 2017-10-18 ENCOUNTER — Telehealth: Payer: Self-pay

## 2017-10-18 ENCOUNTER — Ambulatory Visit (HOSPITAL_COMMUNITY)
Admission: RE | Admit: 2017-10-18 | Discharge: 2017-10-18 | Disposition: A | Discharge status: Home / Self Care | Payer: Medicare Other | Source: Ambulatory Visit | Attending: Hematology and Oncology | Admitting: Hematology and Oncology

## 2017-10-18 DIAGNOSIS — I700 Atherosclerosis of aorta: Secondary | ICD-10-CM | POA: Diagnosis not present

## 2017-10-18 DIAGNOSIS — Z8572 Personal history of non-Hodgkin lymphomas: Secondary | ICD-10-CM | POA: Insufficient documentation

## 2017-10-18 DIAGNOSIS — C44729 Squamous cell carcinoma of skin of left lower limb, including hip: Secondary | ICD-10-CM

## 2017-10-18 DIAGNOSIS — R222 Localized swelling, mass and lump, trunk: Secondary | ICD-10-CM | POA: Diagnosis not present

## 2017-10-18 DIAGNOSIS — Z8579 Personal history of other malignant neoplasms of lymphoid, hematopoietic and related tissues: Secondary | ICD-10-CM | POA: Diagnosis not present

## 2017-10-18 DIAGNOSIS — R590 Localized enlarged lymph nodes: Secondary | ICD-10-CM | POA: Insufficient documentation

## 2017-10-18 MED ORDER — SODIUM CHLORIDE 0.9 % IJ SOLN
INTRAMUSCULAR | Status: AC
  Filled 2017-10-18: qty 50

## 2017-10-18 MED ORDER — IOPAMIDOL (ISOVUE-300) INJECTION 61%
100.0000 mL | Freq: Once | INTRAVENOUS | Status: AC | PRN
  Administered 2017-10-18: 100 mL via INTRAVENOUS

## 2017-10-18 MED ORDER — IOPAMIDOL (ISOVUE-300) INJECTION 61%
INTRAVENOUS | Status: AC
  Filled 2017-10-18: qty 100

## 2017-10-18 NOTE — Telephone Encounter (Signed)
Per Dr Alvy Bimler informed pt CT shows no lymphoma and she should f/u with PCP for possible ortho referral.  Pt's appt for next Mon will be cancelled.  Pt verbalizes understanding of instructions.

## 2017-10-22 ENCOUNTER — Ambulatory Visit: Payer: Medicare Other | Admitting: Hematology and Oncology

## 2017-10-26 DIAGNOSIS — B351 Tinea unguium: Secondary | ICD-10-CM | POA: Diagnosis not present

## 2017-10-26 DIAGNOSIS — M79674 Pain in right toe(s): Secondary | ICD-10-CM | POA: Diagnosis not present

## 2017-11-22 DIAGNOSIS — M170 Bilateral primary osteoarthritis of knee: Secondary | ICD-10-CM | POA: Diagnosis not present

## 2017-11-22 DIAGNOSIS — M1711 Unilateral primary osteoarthritis, right knee: Secondary | ICD-10-CM | POA: Diagnosis not present

## 2017-11-22 DIAGNOSIS — M1712 Unilateral primary osteoarthritis, left knee: Secondary | ICD-10-CM | POA: Diagnosis not present

## 2017-11-29 DIAGNOSIS — M1711 Unilateral primary osteoarthritis, right knee: Secondary | ICD-10-CM | POA: Diagnosis not present

## 2017-12-06 DIAGNOSIS — M1711 Unilateral primary osteoarthritis, right knee: Secondary | ICD-10-CM | POA: Diagnosis not present

## 2017-12-27 DIAGNOSIS — L439 Lichen planus, unspecified: Secondary | ICD-10-CM | POA: Diagnosis not present

## 2017-12-27 DIAGNOSIS — L819 Disorder of pigmentation, unspecified: Secondary | ICD-10-CM | POA: Diagnosis not present

## 2018-01-07 DIAGNOSIS — E785 Hyperlipidemia, unspecified: Secondary | ICD-10-CM | POA: Diagnosis not present

## 2018-01-07 DIAGNOSIS — I10 Essential (primary) hypertension: Secondary | ICD-10-CM | POA: Diagnosis not present

## 2018-01-11 DIAGNOSIS — M79674 Pain in right toe(s): Secondary | ICD-10-CM | POA: Diagnosis not present

## 2018-01-11 DIAGNOSIS — B351 Tinea unguium: Secondary | ICD-10-CM | POA: Diagnosis not present

## 2018-01-17 ENCOUNTER — Telehealth: Payer: Self-pay | Admitting: Hematology and Oncology

## 2018-01-17 ENCOUNTER — Encounter: Payer: Self-pay | Admitting: Hematology and Oncology

## 2018-01-17 ENCOUNTER — Inpatient Hospital Stay: Payer: Medicare Other

## 2018-01-17 ENCOUNTER — Inpatient Hospital Stay: Payer: Medicare Other | Attending: Hematology and Oncology | Admitting: Hematology and Oncology

## 2018-01-17 ENCOUNTER — Other Ambulatory Visit: Payer: Self-pay | Admitting: Hematology and Oncology

## 2018-01-17 DIAGNOSIS — Z8572 Personal history of non-Hodgkin lymphomas: Secondary | ICD-10-CM | POA: Diagnosis not present

## 2018-01-17 DIAGNOSIS — C44729 Squamous cell carcinoma of skin of left lower limb, including hip: Secondary | ICD-10-CM | POA: Diagnosis not present

## 2018-01-17 DIAGNOSIS — I10 Essential (primary) hypertension: Secondary | ICD-10-CM | POA: Diagnosis not present

## 2018-01-17 DIAGNOSIS — E785 Hyperlipidemia, unspecified: Secondary | ICD-10-CM | POA: Diagnosis not present

## 2018-01-17 LAB — COMPREHENSIVE METABOLIC PANEL
ALT: 24 U/L (ref 0–55)
ANION GAP: 8 (ref 3–11)
AST: 28 U/L (ref 5–34)
Albumin: 4.4 g/dL (ref 3.5–5.0)
Alkaline Phosphatase: 71 U/L (ref 40–150)
BUN: 19 mg/dL (ref 7–26)
CHLORIDE: 103 mmol/L (ref 98–109)
CO2: 30 mmol/L — ABNORMAL HIGH (ref 22–29)
Calcium: 9.8 mg/dL (ref 8.4–10.4)
Creatinine, Ser: 1.04 mg/dL (ref 0.60–1.10)
GFR calc non Af Amer: 52 mL/min — ABNORMAL LOW (ref >60)
Glucose, Bld: 94 mg/dL (ref 70–140)
Potassium: 3.4 mmol/L — ABNORMAL LOW (ref 3.5–5.1)
SODIUM: 141 mmol/L (ref 136–145)
Total Bilirubin: 0.6 mg/dL (ref 0.2–1.2)
Total Protein: 6.9 g/dL (ref 6.4–8.3)

## 2018-01-17 LAB — CBC WITH DIFFERENTIAL/PLATELET
Basophils Absolute: 0.1 10*3/uL (ref 0.0–0.1)
Basophils Relative: 2 %
Eosinophils Absolute: 0.2 10*3/uL (ref 0.0–0.5)
Eosinophils Relative: 6 %
HEMATOCRIT: 38.3 % (ref 34.8–46.6)
HEMOGLOBIN: 13.1 g/dL (ref 11.6–15.9)
LYMPHS ABS: 1.4 10*3/uL (ref 0.9–3.3)
Lymphocytes Relative: 40 %
MCH: 29 pg (ref 25.1–34.0)
MCHC: 34.2 g/dL (ref 31.5–36.0)
MCV: 84.9 fL (ref 79.5–101.0)
MONOS PCT: 6 %
Monocytes Absolute: 0.2 10*3/uL (ref 0.1–0.9)
NEUTROS ABS: 1.7 10*3/uL (ref 1.5–6.5)
NEUTROS PCT: 46 %
Platelets: 172 10*3/uL (ref 145–400)
RBC: 4.51 MIL/uL (ref 3.70–5.45)
RDW: 13.8 % (ref 11.2–14.5)
WBC: 3.6 10*3/uL — ABNORMAL LOW (ref 3.9–10.3)

## 2018-01-17 LAB — LACTATE DEHYDROGENASE: LDH: 226 U/L (ref 125–245)

## 2018-01-17 NOTE — Assessment & Plan Note (Signed)
She has multiple skin lesions I would defer to her dermatologist for management

## 2018-01-17 NOTE — Assessment & Plan Note (Signed)
She has chronic intermittent hypertension She is overweight We discussed risk factor modification and close follow-up with primary care doctor for medication management and adjustment

## 2018-01-17 NOTE — Assessment & Plan Note (Signed)
She has high risk disease Examination and blood work showed no evidence of disease Recent CT scan from February 2019 showed no evidence of cancer recurrence I plan to see her back in 6 months with history, physical examination and blood work

## 2018-01-17 NOTE — Telephone Encounter (Signed)
Gave avs and calendar ° °

## 2018-01-17 NOTE — Progress Notes (Signed)
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   INTERVAL HISTORY: Please see below for problem oriented charting. 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 Needle biopsy was inconclusive.        08/02/2011 Imaging    CT scan of the chest abdomen and pelvis showed numerous lymphadenopathy above  and below the diaphragm.       08/16/2011 Imaging    MRI of the abdomen revealed enlarging left kidney lesion worrisome for malignancy      09/20/2011 Initial Diagnosis    Lymphoma, low grade from kidney biopsy, follicular      2/42/6834 Procedure    Histologic type: Non-Hodgkin's lymphoma, follicular center cell type. Grade (if applicable): Favor high grade, Ki-67 ranges from 10-50%. Immunohistochemical stains: CD20, CD79a, CD10, BCL-2, BCL-6, CD21, CD3, CD43.      05/09/2013 Procedure    BM biopsy is highly suspicious of involvement. Patient had persistent leukopenia      05/09/2013 Imaging    PET/CT showed significant involvement of LN everywhere      07/16/2013 - 11/05/2013 Chemotherapy    She completed 6 cycles of R. CHOP chemotherapy.      07/23/2013 - 07/27/2013 Hospital Admission    Admitted for management of mucositis and neutropenic fever. Blood culture was positive for gram negative rods, resolved with antibiotics      10/08/2013 Adverse Reaction    The dose of vincristine is reduced by 50%      12/15/2013 Imaging    Repeat PET CT scan show complete response to treatment.      12/17/2013 - 10/20/2015 Chemotherapy    She begin maintenance rituximab every other month x 2 years      02/23/2014 Procedure    She had shave biopsy of the left upper, that came back well-differentiated squamous cell carcinoma.      04/29/2014 Imaging    CT  scan of the abdomen showed no evidence of disease recurrence.      10/12/2014 Imaging    Repeat CT scan of the chest, abdomen and pelvis showed no evidence of recurrence.      10/19/2015 Imaging    Ct scan showed no evidence of lymphoma      06/08/2017 Imaging    MRI lumbar Transitional lumbosacral anatomy.  L5 incorporated into the sacrum  Mild degenerative change L2-3 without stenosis  Mild anterolisthesis L3-4 with facet degeneration and subarticular stenosis bilaterally  4 mm anterolisthesis L4-5 with facet degeneration and  mild subarticular stenosis bilaterally      06/08/2017 Imaging    MRI left hip 1. Left trochanteric bursitis. 2. Abnormal edema in the left quadrate is femoris muscle probably from muscle strain. 3. Axial loss of articular cartilage thickness in both hips with mild associated spurring of the femoral heads. 4. Small amount of nonspecific free pelvic fluid.      10/18/2017 Imaging    1. Stable small scattered supraclavicular, mediastinal and mesenteric lymph nodes but no findings worrisome for recurrent lymphoma. 2. Single small subcutaneous lesion in the lower abdominal area has decreased in size since the prior study. No new lesions. 3. No acute abdominal or pelvic findings. No acute pulmonary findings. 4. Stable age advanced vascular calcifications.       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       REVIEW OF SYSTEMS:   Constitutional: Denies fevers, chills or abnormal weight loss Eyes: Denies blurriness of vision Ears, nose, mouth, throat, and face: Denies mucositis or sore throat Respiratory: Denies cough, dyspnea or wheezes Cardiovascular: Denies palpitation, chest discomfort or lower extremity swelling Gastrointestinal:  Denies nausea, heartburn or change in bowel habits Skin: Denies abnormal skin rashes Lymphatics: Denies new lymphadenopathy or easy bruising Neurological:Denies numbness, tingling or new weaknesses Behavioral/Psych: Mood is stable, no new changes  All other systems were reviewed with the patient and are negative.  I have reviewed the past medical history, past surgical history, social history and family history with the patient and they are unchanged  from previous note.  ALLERGIES:  is allergic to latex.  MEDICATIONS:  Current Outpatient Medications  Medication Sig Dispense Refill  . acetaminophen (TYLENOL) 500 MG tablet Take 250 mg by mouth every 6 (six) hours as needed for moderate pain.    Marland Kitchen amLODipine (NORVASC) 5 MG tablet Take 10 mg by mouth at bedtime.    Marland Kitchen aspirin 81 MG tablet Take 81 mg by mouth at bedtime.     . B Complex Vitamins (B-COMPLEX/B-12 PO) Take by mouth.    Marland Kitchen CALCIUM-MAGNESIUM-ZINC PO Take 1 tablet by mouth daily.     . Cholecalciferol (VITAMIN D) 2000 UNITS tablet Take 2,000 Units by mouth daily.    . clobetasol cream (TEMOVATE) 3.29 % Apply 1 application topically 2 (two) times daily.    . Cyanocobalamin (VITAMIN B 12 PO) Take 1 tablet by mouth daily.    . famotidine (PEPCID) 20 MG tablet Take 20 mg by mouth 2 (two) times daily.    . hydrochlorothiazide (HYDRODIURIL) 25 MG tablet Take 25 mg by mouth daily.    Marland Kitchen loratadine (CLARITIN) 10  MG tablet Take 10 mg by mouth daily.    . Omega-3 Fatty Acids (FISH OIL) 1000 MG CAPS Take by mouth 2 (two) times daily.    . polyethylene glycol (MIRALAX / GLYCOLAX) packet Take 17 g by mouth daily.    . Probiotic Product (PROBIOTIC DAILY PO) Take 1 capsule by mouth daily.    Marland Kitchen pyridOXINE (VITAMIN B-6) 100 MG tablet Take 100 mg by mouth daily.      No current facility-administered medications for this visit.     PHYSICAL EXAMINATION: ECOG PERFORMANCE STATUS: 0 - Asymptomatic  Vitals:   01/17/18 0811  BP: (!) 150/75  Pulse: 72  Resp: 18  Temp: 98.4 F (36.9 C)  SpO2: 100%   Filed Weights   01/17/18 0811  Weight: 183 lb 12.8 oz (83.4 kg)    GENERAL:alert, no distress and comfortable SKIN: skin color, texture, turgor are normal, no rashes or significant lesions EYES: normal, Conjunctiva are pink and non-injected, sclera clear OROPHARYNX:no exudate, no erythema and lips, buccal mucosa, and tongue normal  NECK: supple, thyroid normal size, non-tender, without  nodularity LYMPH:  no palpable lymphadenopathy in the cervical, axillary or inguinal LUNGS: clear to auscultation and percussion with normal breathing effort HEART: regular rate & rhythm and no murmurs and no lower extremity edema ABDOMEN:abdomen soft, non-tender and normal bowel sounds Musculoskeletal:no cyanosis of digits and no clubbing  NEURO: alert & oriented x 3 with fluent speech, no focal motor/sensory deficits  LABORATORY DATA:  I have reviewed the data as listed    Component Value Date/Time   NA 141 01/17/2018 0745   NA 140 01/18/2017 0757   K 3.4 (L) 01/17/2018 0745   K 4.1 01/18/2017 0757   CL 103 01/17/2018 0745   CO2 30 (H) 01/17/2018 0745   CO2 25 01/18/2017 0757   GLUCOSE 94 01/17/2018 0745   GLUCOSE 94 01/18/2017 0757   BUN 19 01/17/2018 0745   BUN 14.0 01/18/2017 0757   CREATININE 1.04 01/17/2018 0745   CREATININE 1.2 (H) 01/18/2017 0757   CALCIUM 9.8 01/17/2018 0745   CALCIUM 9.3 01/18/2017 0757   PROT 6.9 01/17/2018 0745   PROT 7.0 01/18/2017 0757   ALBUMIN 4.4 01/17/2018 0745   ALBUMIN 4.3 01/18/2017 0757   AST 28 01/17/2018 0745   AST 22 01/18/2017 0757   ALT 24 01/17/2018 0745   ALT 19 01/18/2017 0757   ALKPHOS 71 01/17/2018 0745   ALKPHOS 60 01/18/2017 0757   BILITOT 0.6 01/17/2018 0745   BILITOT 1.13 01/18/2017 0757   GFRNONAA 52 (L) 01/17/2018 0745   GFRAA >60 01/17/2018 0745    No results found for: SPEP, UPEP  Lab Results  Component Value Date   WBC 3.6 (L) 01/17/2018   NEUTROABS 1.7 01/17/2018   HGB 13.1 01/17/2018   HCT 38.3 01/17/2018   MCV 84.9 01/17/2018   PLT 172 01/17/2018      Chemistry      Component Value Date/Time   NA 141 01/17/2018 0745   NA 140 01/18/2017 0757   K 3.4 (L) 01/17/2018 0745   K 4.1 01/18/2017 0757   CL 103 01/17/2018 0745   CO2 30 (H) 01/17/2018 0745   CO2 25 01/18/2017 0757   BUN 19 01/17/2018 0745   BUN 14.0 01/18/2017 0757   CREATININE 1.04 01/17/2018 0745   CREATININE 1.2 (H) 01/18/2017  0757      Component Value Date/Time   CALCIUM 9.8 01/17/2018 0745   CALCIUM 9.3 01/18/2017 0757   ALKPHOS  71 01/17/2018 0745   ALKPHOS 60 01/18/2017 0757   AST 28 01/17/2018 0745   AST 22 01/18/2017 0757   ALT 24 01/17/2018 0745   ALT 19 01/18/2017 0757   BILITOT 0.6 01/17/2018 0745   BILITOT 1.13 01/18/2017 0757       All questions were answered. The patient knows to call the clinic with any problems, questions or concerns. No barriers to learning was detected.  I spent 15 minutes counseling the patient face to face. The total time spent in the appointment was 20 minutes and more than 50% was on counseling and review of test results  Heath Lark, MD 01/17/2018 11:00 AM

## 2018-03-22 DIAGNOSIS — M79674 Pain in right toe(s): Secondary | ICD-10-CM | POA: Diagnosis not present

## 2018-03-22 DIAGNOSIS — B351 Tinea unguium: Secondary | ICD-10-CM | POA: Diagnosis not present

## 2018-04-08 DIAGNOSIS — E785 Hyperlipidemia, unspecified: Secondary | ICD-10-CM | POA: Diagnosis not present

## 2018-04-08 DIAGNOSIS — S2002XA Contusion of left breast, initial encounter: Secondary | ICD-10-CM | POA: Diagnosis not present

## 2018-04-08 DIAGNOSIS — Z6832 Body mass index (BMI) 32.0-32.9, adult: Secondary | ICD-10-CM | POA: Diagnosis not present

## 2018-04-08 DIAGNOSIS — I10 Essential (primary) hypertension: Secondary | ICD-10-CM | POA: Diagnosis not present

## 2018-05-03 DIAGNOSIS — K219 Gastro-esophageal reflux disease without esophagitis: Secondary | ICD-10-CM | POA: Diagnosis not present

## 2018-05-03 DIAGNOSIS — Z6834 Body mass index (BMI) 34.0-34.9, adult: Secondary | ICD-10-CM | POA: Diagnosis not present

## 2018-05-03 DIAGNOSIS — E669 Obesity, unspecified: Secondary | ICD-10-CM | POA: Diagnosis not present

## 2018-05-17 DIAGNOSIS — Z23 Encounter for immunization: Secondary | ICD-10-CM | POA: Diagnosis not present

## 2018-05-20 ENCOUNTER — Other Ambulatory Visit: Payer: Self-pay | Admitting: Family Medicine

## 2018-05-20 DIAGNOSIS — Z1231 Encounter for screening mammogram for malignant neoplasm of breast: Secondary | ICD-10-CM

## 2018-05-31 DIAGNOSIS — M79674 Pain in right toe(s): Secondary | ICD-10-CM | POA: Diagnosis not present

## 2018-05-31 DIAGNOSIS — B351 Tinea unguium: Secondary | ICD-10-CM | POA: Diagnosis not present

## 2018-06-03 DIAGNOSIS — Z8572 Personal history of non-Hodgkin lymphomas: Secondary | ICD-10-CM | POA: Diagnosis not present

## 2018-06-03 DIAGNOSIS — I10 Essential (primary) hypertension: Secondary | ICD-10-CM | POA: Diagnosis not present

## 2018-06-03 DIAGNOSIS — E785 Hyperlipidemia, unspecified: Secondary | ICD-10-CM | POA: Diagnosis not present

## 2018-06-03 DIAGNOSIS — C8239 Follicular lymphoma grade IIIa, extranodal and solid organ sites: Secondary | ICD-10-CM | POA: Diagnosis not present

## 2018-06-06 DIAGNOSIS — D125 Benign neoplasm of sigmoid colon: Secondary | ICD-10-CM | POA: Diagnosis not present

## 2018-06-06 DIAGNOSIS — K648 Other hemorrhoids: Secondary | ICD-10-CM | POA: Diagnosis not present

## 2018-06-06 DIAGNOSIS — K5730 Diverticulosis of large intestine without perforation or abscess without bleeding: Secondary | ICD-10-CM | POA: Diagnosis not present

## 2018-06-06 DIAGNOSIS — D126 Benign neoplasm of colon, unspecified: Secondary | ICD-10-CM | POA: Diagnosis not present

## 2018-06-06 DIAGNOSIS — Z1211 Encounter for screening for malignant neoplasm of colon: Secondary | ICD-10-CM | POA: Diagnosis not present

## 2018-06-06 DIAGNOSIS — K635 Polyp of colon: Secondary | ICD-10-CM | POA: Diagnosis not present

## 2018-06-06 DIAGNOSIS — K219 Gastro-esophageal reflux disease without esophagitis: Secondary | ICD-10-CM | POA: Diagnosis not present

## 2018-06-06 HISTORY — PX: COLONOSCOPY: SHX174

## 2018-06-06 HISTORY — PX: ESOPHAGOGASTRODUODENOSCOPY: SHX1529

## 2018-06-17 DIAGNOSIS — I10 Essential (primary) hypertension: Secondary | ICD-10-CM | POA: Diagnosis not present

## 2018-06-17 DIAGNOSIS — M25552 Pain in left hip: Secondary | ICD-10-CM | POA: Diagnosis not present

## 2018-06-17 DIAGNOSIS — Z6833 Body mass index (BMI) 33.0-33.9, adult: Secondary | ICD-10-CM | POA: Diagnosis not present

## 2018-06-17 DIAGNOSIS — E669 Obesity, unspecified: Secondary | ICD-10-CM | POA: Diagnosis not present

## 2018-06-24 ENCOUNTER — Ambulatory Visit
Admission: RE | Admit: 2018-06-24 | Discharge: 2018-06-24 | Disposition: A | Discharge status: Home / Self Care | Payer: Medicare Other | Source: Ambulatory Visit | Attending: Family Medicine | Admitting: Family Medicine

## 2018-06-24 DIAGNOSIS — Z1231 Encounter for screening mammogram for malignant neoplasm of breast: Secondary | ICD-10-CM | POA: Diagnosis not present

## 2018-07-03 DIAGNOSIS — L280 Lichen simplex chronicus: Secondary | ICD-10-CM | POA: Diagnosis not present

## 2018-07-09 DIAGNOSIS — Z6832 Body mass index (BMI) 32.0-32.9, adult: Secondary | ICD-10-CM | POA: Diagnosis not present

## 2018-07-09 DIAGNOSIS — I10 Essential (primary) hypertension: Secondary | ICD-10-CM | POA: Diagnosis not present

## 2018-07-10 DIAGNOSIS — K648 Other hemorrhoids: Secondary | ICD-10-CM | POA: Diagnosis not present

## 2018-07-11 ENCOUNTER — Telehealth: Payer: Self-pay | Admitting: Hematology and Oncology

## 2018-07-11 NOTE — Telephone Encounter (Signed)
NG out 10/30 thru 11/29 - moved 11/8 lab/fu to 12/16. Spoke with patient.

## 2018-07-14 NOTE — Progress Notes (Signed)
Cardiology Office Note   Date:  07/15/2018   ID:  [name], [name] 04/29/1946, MRN [id number]  PCP:  [name], MD  Cardiologist:   No primary care provider on file.   Chief Complaint  Patient presents with  . Edema      History of Present Illness: [name] is a 72 y.o. female who presents for follow up of a coronary anomaly.  I last saw her in 2016.  She returns for follow-up.  She is had no new cardiovascular complaints other than lower extremity swelling since I saw her.  This is been going on for a while.  It is in her left ankle more than her right.  It is better when she gets up in the morning and accumulates as the day goes on.  She is now wearing compression stockings.  She is not describing new shortness of breath, PND or orthopnea.  She is not having any chest pressure, neck or arm discomfort.  She might get some mild shortness of breath climbing stairs.  She is not exercising routinely.  She does a little stretching.  She stays busy around her house.    Past Medical History:  Diagnosis Date  . 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 ANSERINE BURSITIS 03/30/2008   Qualifier: Diagnosis of  By: Aline Brochure MD, Dorothyann Peng    . Bilateral lower extremity edema 02/18/2014  . Chronic back pain greater than 3 months duration 10/11/2017  . Congenital coronary artery anomaly   . Cramp of limb 11/16/2008   Qualifier: Diagnosis of  By: Percival Spanish, MD, Farrel Gordon    . Drug-induced leukopenia ([place]) 01/19/2016  . Dysuria 05/10/2015  . Enlargement of lymph nodes 07/26/2011  . Fever 07/23/2013  . FINGER SPRAIN 01/06/2008   Qualifier: Diagnosis of  By: Aline Brochure MD, Dorothyann Peng    . Follicular lymphoma ([place]) 06/27/2013  . 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  B-cell, favor follicular NHL.  CD79a, CD20 and CD10 positive.  Cyclin D1 negative.   . Fungal infection 08/01/2013  . GERD  (gastroesophageal reflux disease)   . Hemorrhoids 08/05/2013  . Hepatic steatosis 10/20/2015  . History of B-cell lymphoma 09/25/2011   Core needle biopsy of left kidney mass--lower pole 09/30/11.  B-cell, favor follicular NHL.  CD79a, CD20 and CD10 positive.  Cyclin D1 negative.   Marland Kitchen HTN (hypertension)   . Hyperlipidemia   . Infection of lip 10/27/2013  . Insomnia 07/25/2013  . KNEE PAIN 03/30/2008   Qualifier: Diagnosis of  By: Aline Brochure MD, Dorothyann Peng    . Leukopenia 09/16/2013  . Lichen planus   . Lichen sclerosus 04/20/1750  . Liver mass, right lobe 07/27/2011   1st noted by [place] 04/18/05 and by MRI 05/07/06   . Mouth sores 07/18/2013  . Nausea without vomiting 05/12/2015  . 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 OVERWEIGHT 12/27/2009   Qualifier: Diagnosis of  By: Percival Spanish, MD, Farrel Gordon    . Pancytopenia due to antineoplastic chemotherapy ([place]) 09/16/2013  . Skin lesion of left leg 02/18/2014  . Skull deformity 10/08/2013  . 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 Syncope 10/14/2013  . Thyroid disease   . UTI (urinary tract infection) 11/10/2013    Past Surgical History:  Procedure Laterality Date  . ABDOMINAL HYSTERECTOMY    . BONE MARROW ASPIRATION  05/09/2013  .  BONE MARROW BIOPSY  05/09/2013  . BREAST EXCISIONAL BIOPSY Bilateral    x4 1970s  . KNEE ARTHROSCOPY W/ DEBRIDEMENT  05/2011   right  . LYMPH NODE DISSECTION  x 3 different times   behind left ear 2012/ left clavical area  . MR BREAST BILATERAL  cyst removal  . TUBAL LIGATION       Current Outpatient Medications  Medication Sig Dispense Refill  . acetaminophen (TYLENOL) 500 MG tablet Take 250 mg by mouth every 6 (six) hours as needed for moderate pain.    Marland Kitchen amLODipine (NORVASC) 5 MG tablet Take 10 mg by mouth at bedtime.    Marland Kitchen aspirin 81 MG tablet Take 81 mg by mouth at bedtime.     . B Complex Vitamins (B-COMPLEX/B-12 PO) Take by mouth.    Marland Kitchen CALCIUM-MAGNESIUM-ZINC PO Take 1 tablet by  mouth daily.     . Cholecalciferol (VITAMIN D) 2000 UNITS tablet Take 2,000 Units by mouth daily.    . clobetasol cream (TEMOVATE) 8.32 % Apply 1 application topically 2 (two) times daily.    . Cyanocobalamin (VITAMIN B 12 PO) Take 1 tablet by mouth daily.    . famotidine (PEPCID) 20 MG tablet Take 20 mg by mouth 2 (two) times daily.    Marland Kitchen loratadine (CLARITIN) 10 MG tablet Take 10 mg by mouth daily.    . Omega-3 Fatty Acids (FISH OIL) 1000 MG CAPS Take by mouth 2 (two) times daily.    . polyethylene glycol (MIRALAX / GLYCOLAX) packet Take 17 g by mouth daily.    . Probiotic Product (PROBIOTIC DAILY PO) Take 1 capsule by mouth daily.    Marland Kitchen pyridOXINE (VITAMIN B-6) 100 MG tablet Take 100 mg by mouth daily.     Marland Kitchen spironolactone (ALDACTONE) 25 MG tablet Take 1 tablet by mouth daily.     No current facility-administered medications for this visit.     Allergies:   Latex    Social History:  The patient  reports that she has never smoked. She has never used smokeless tobacco. She reports that she does not drink alcohol or use drugs.   Family History:  The patient's family history includes Breast cancer in her paternal aunt and paternal aunt; Breast cancer (age of onset: 11) in her mother; Cancer in her brother, father, and mother; Coronary artery disease in her brother; Heart disease in her brother and father; Stroke in her mother.    ROS:  Please see the history of present illness.   Otherwise, review of systems are positive for GERD.   All other systems are reviewed and negative.    PHYSICAL EXAM: VS:  BP 136/80 (BP Location: Right Arm, Patient Position: Sitting, Cuff Size: Normal)   Pulse 71   Ht 5' 2"  (1.575 m)   Wt 183 lb 9.6 oz (83.3 kg)   BMI 33.58 kg/m  , BMI Body mass index is 33.58 kg/m. GENERAL:  Well appearing HEENT:  Pupils equal round and reactive, fundi not visualized, oral mucosa unremarkable NECK:  No jugular venous distention, waveform within normal limits, carotid  upstroke brisk and symmetric, no bruits, no thyromegaly LYMPHATICS:  No cervical, inguinal adenopathy LUNGS:  Clear to auscultation bilaterally BACK:  No CVA tenderness CHEST:  Unremarkable HEART:  PMI not displaced or sustained,S1 and S2 within normal limits, no S3, no S4, no clicks, no rubs, no murmurs ABD:  Flat, positive bowel sounds normal in frequency in pitch, no bruits, no rebound, no guarding, no midline pulsatile  mass, no hepatomegaly, no splenomegaly EXT:  2 plus pulses throughout, mild left greater than right ankle edema, no cyanosis no clubbing SKIN:  No rashes no nodules NEURO:  Cranial nerves II through XII grossly intact, motor grossly intact throughout PSYCH:  Cognitively intact, oriented to person place and time    EKG:  EKG is ordered today. The ekg ordered today demonstrates sinus rhythm, rate 71, axis within normal limits, intervals within normal limits, no acute ST-T wave changes.   Recent Labs: 01/17/2018: ALT 24; BUN 19; Creatinine, Ser 1.04; Hemoglobin 13.1; Platelets 172; Potassium 3.4; Sodium 141    Lipid Panel No results found for: CHOL, TRIG, HDL, CHOLHDL, VLDL, LDLCALC, LDLDIRECT    Wt Readings from Last 3 Encounters:  07/15/18 183 lb 9.6 oz (83.3 kg)  01/17/18 183 lb 12.8 oz (83.4 kg)  10/11/17 182 lb 3 oz (82.6 kg)      Other studies Reviewed: Additional studies/ records that were reviewed today include: Labs. Review of the above records demonstrates:  Please see elsewhere in the note.     ASSESSMENT AND PLAN:  CORONARY ARTERY ANOMALY, CONGENITAL She has no symptoms related to this.  No further cardiovascular testing is suggested.  ESSENTIAL HYPERTENSION, BENIGN The blood pressure is at target and she will continue the meds as listed.   HYPERLIPIDEMIA Her LDL was 179.  I went back and reviewed some imaging in her chest CTs previously demonstrated some vascular calcifications and she was told by an eye doctor that she had some  atherosclerosis in her eyes.  We talked a great length about diet and she could stop using red meat and have this checked again.  I would suggest though that if her LDL does not come down significantly she should statin and she want to talk so with a primary care doctor.  She has not tolerated prescription as well in the past.    SYNCOPE: She is had no recurrence of this.  No further work-up is planned.  HIGH RISK MED:   After completing R CHOP her EF was preserved.  No further imaging is indicated.  EDEMA: I do not suspect any left-sided heart failure.  This is very mild.  I agree currently with conservative therapy and no other imaging at this point.   Current medicines are reviewed at length with the patient today.  The patient does not have concerns regarding medicines.  The following changes have been made:  no change  Labs/ tests ordered today include: None  Orders Placed This Encounter  Procedures  . EKG 12-Lead     Disposition:   FU with me in about two years.     Signed, Minus Breeding, MD  07/15/2018 12:18 PM    [facility]

## 2018-07-15 ENCOUNTER — Encounter: Payer: Self-pay | Admitting: Cardiology

## 2018-07-15 ENCOUNTER — Ambulatory Visit (INDEPENDENT_AMBULATORY_CARE_PROVIDER_SITE_OTHER): Payer: Medicare Other | Admitting: Cardiology

## 2018-07-15 VITALS — BP 136/80 | HR 71 | Ht 62.0 in | Wt 183.6 lb

## 2018-07-15 DIAGNOSIS — Q245 Malformation of coronary vessels: Secondary | ICD-10-CM

## 2018-07-15 DIAGNOSIS — I10 Essential (primary) hypertension: Secondary | ICD-10-CM

## 2018-07-15 DIAGNOSIS — R609 Edema, unspecified: Secondary | ICD-10-CM

## 2018-07-15 NOTE — Patient Instructions (Signed)
Medication Instructions:  Continue current medications  If you need a refill on your cardiac medications before your next appointment, please call your pharmacy.  Labwork: None ordered  If you have labs (blood work) drawn today and your tests are completely normal, you will receive your results only by: Marland Kitchen MyChart Message (if you have MyChart) OR . A paper copy in the mail If you have any lab test that is abnormal or we need to change your treatment, we will call you to review the results.  Testing/Procedures: None Ordered  Follow-Up: You will need a follow up appointment in 2 Years.  Please call our office 2 months in advance([phone]) to schedule the (2 Year) appointment.  You may see  DR Percival Spanish or one of the following Advanced Practice Providers on your designated Care Team:   . Jory Sims, DNP, ANP . Rhonda Barrett, PA-C .  Marland Kitchen Kerin Ransom, PA-C . Daleen Snook Kroeger, PA-C . Sande Rives, PA-C .  Marland Kitchen Almyra Deforest, PA-C . Fabian Sharp, PA-C  At [facility], you and your health needs are our priority.  As part of our continuing mission to provide you with exceptional heart care, we have created designated Provider Care Teams.  These Care Teams include your primary Cardiologist (physician) and Advanced Practice Providers (APPs -  Physician Assistants and Nurse Practitioners) who all work together to provide you with the care you need, when you need it.   Thank you for choosing [facility] at [facility]!!

## 2018-07-17 DIAGNOSIS — M1711 Unilateral primary osteoarthritis, right knee: Secondary | ICD-10-CM | POA: Diagnosis not present

## 2018-07-19 ENCOUNTER — Ambulatory Visit: Payer: Medicare Other | Admitting: Hematology and Oncology

## 2018-07-19 ENCOUNTER — Other Ambulatory Visit: Payer: Medicare Other

## 2018-07-25 DIAGNOSIS — M1711 Unilateral primary osteoarthritis, right knee: Secondary | ICD-10-CM | POA: Diagnosis not present

## 2018-08-01 DIAGNOSIS — L819 Disorder of pigmentation, unspecified: Secondary | ICD-10-CM | POA: Diagnosis not present

## 2018-08-01 DIAGNOSIS — L439 Lichen planus, unspecified: Secondary | ICD-10-CM | POA: Diagnosis not present

## 2018-08-01 DIAGNOSIS — M1711 Unilateral primary osteoarthritis, right knee: Secondary | ICD-10-CM | POA: Diagnosis not present

## 2018-08-05 DIAGNOSIS — I10 Essential (primary) hypertension: Secondary | ICD-10-CM | POA: Diagnosis not present

## 2018-08-05 DIAGNOSIS — Z6832 Body mass index (BMI) 32.0-32.9, adult: Secondary | ICD-10-CM | POA: Diagnosis not present

## 2018-08-22 ENCOUNTER — Other Ambulatory Visit: Payer: Self-pay | Admitting: Hematology and Oncology

## 2018-08-22 ENCOUNTER — Other Ambulatory Visit: Payer: Self-pay

## 2018-08-22 DIAGNOSIS — C44729 Squamous cell carcinoma of skin of left lower limb, including hip: Secondary | ICD-10-CM

## 2018-08-22 DIAGNOSIS — Z8572 Personal history of non-Hodgkin lymphomas: Secondary | ICD-10-CM

## 2018-08-26 ENCOUNTER — Telehealth: Payer: Self-pay | Admitting: Hematology and Oncology

## 2018-08-26 ENCOUNTER — Encounter: Payer: Self-pay | Admitting: Hematology and Oncology

## 2018-08-26 ENCOUNTER — Inpatient Hospital Stay: Payer: Medicare Other | Attending: Hematology and Oncology

## 2018-08-26 ENCOUNTER — Inpatient Hospital Stay (HOSPITAL_BASED_OUTPATIENT_CLINIC_OR_DEPARTMENT_OTHER): Payer: Medicare Other | Admitting: Hematology and Oncology

## 2018-08-26 DIAGNOSIS — Z8572 Personal history of non-Hodgkin lymphomas: Secondary | ICD-10-CM | POA: Diagnosis not present

## 2018-08-26 DIAGNOSIS — D72819 Decreased white blood cell count, unspecified: Secondary | ICD-10-CM

## 2018-08-26 DIAGNOSIS — C44729 Squamous cell carcinoma of skin of left lower limb, including hip: Secondary | ICD-10-CM | POA: Insufficient documentation

## 2018-08-26 LAB — COMPREHENSIVE METABOLIC PANEL
ALBUMIN: 4 g/dL (ref 3.5–5.0)
ALK PHOS: 63 U/L (ref 38–126)
ALT: 19 U/L (ref 0–44)
AST: 24 U/L (ref 15–41)
Anion gap: 10 (ref 5–15)
BILIRUBIN TOTAL: 0.6 mg/dL (ref 0.3–1.2)
BUN: 13 mg/dL (ref 8–23)
CALCIUM: 9.6 mg/dL (ref 8.9–10.3)
CO2: 24 mmol/L (ref 22–32)
CREATININE: 0.99 mg/dL (ref 0.44–1.00)
Chloride: 108 mmol/L (ref 98–111)
GFR calc Af Amer: >60 mL/min (ref >60)
GFR calc non Af Amer: 57 mL/min — ABNORMAL LOW (ref >60)
GLUCOSE: 101 mg/dL — AB (ref 70–99)
Potassium: 4.1 mmol/L (ref 3.5–5.1)
SODIUM: 142 mmol/L (ref 135–145)
Total Protein: 6.8 g/dL (ref 6.5–8.1)

## 2018-08-26 LAB — CBC WITH DIFFERENTIAL/PLATELET
ABS IMMATURE GRANULOCYTES: 0.01 10*3/uL (ref 0.00–0.07)
BASOS ABS: 0.1 10*3/uL (ref 0.0–0.1)
Basophils Relative: 2 %
Eosinophils Absolute: 0.1 10*3/uL (ref 0.0–0.5)
Eosinophils Relative: 4 %
HEMATOCRIT: 37.3 % (ref 36.0–46.0)
HEMOGLOBIN: 12.3 g/dL (ref 12.0–15.0)
Immature Granulocytes: 0 %
LYMPHS ABS: 1 10*3/uL (ref 0.7–4.0)
LYMPHS PCT: 29 %
MCH: 27.7 pg (ref 26.0–34.0)
MCHC: 33 g/dL (ref 30.0–36.0)
MCV: 84 fL (ref 80.0–100.0)
Monocytes Absolute: 0.5 10*3/uL (ref 0.1–1.0)
Monocytes Relative: 16 %
NEUTROS ABS: 1.6 10*3/uL — AB (ref 1.7–7.7)
NRBC: 0 % (ref 0.0–0.2)
Neutrophils Relative %: 49 %
Platelets: 187 10*3/uL (ref 150–400)
RBC: 4.44 MIL/uL (ref 3.87–5.11)
RDW: 14 % (ref 11.5–15.5)
WBC: 3.3 10*3/uL — AB (ref 4.0–10.5)

## 2018-08-26 NOTE — Assessment & Plan Note (Signed)
She has multiple skin lesions with history of skin cancer. I would defer to her dermatologist for management

## 2018-08-26 NOTE — Telephone Encounter (Signed)
Per 12/16 los, return for no new orders.

## 2018-08-26 NOTE — Assessment & Plan Note (Signed)
She has chronic leukopenia This is likely related to her African-American heritage She is not symptomatic Observe only She does not need long-term hematology follow-up for this

## 2018-08-26 NOTE — Assessment & Plan Note (Signed)
Examination and blood work showed no evidence of disease Recent CT scan from February 2019 showed no evidence of cancer recurrence By February 2020, the patient would be considered a long-term cancer survivor.  Her last chemotherapy was in February 2014. The patient is educated to watch out for signs and symptoms of cancer recurrence I recommend discharge from the clinic.  She will continue age-appropriate screening with her primary care doctor

## 2018-08-26 NOTE — Progress Notes (Signed)
[facility] PROGRESS NOTE  Patient Care Team: [name], MD as PCP - General (Family Medicine) Heath Lark, MD as Consulting Physician (Hematology and Oncology)  ASSESSMENT & PLAN:  History of B-cell lymphoma Examination and blood work showed no evidence of disease Recent CT scan from February 2019 showed no evidence of cancer recurrence By February 2020, the patient would be considered a long-term cancer survivor.  Her last chemotherapy was in February 2014. The patient is educated to watch out for signs and symptoms of cancer recurrence I recommend discharge from the clinic.  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 I would defer to her dermatologist for management  Chronic leukopenia She has chronic leukopenia This is likely related to her African-American heritage She is not symptomatic Observe only She does not need long-term hematology follow-up for this   No orders of the defined types were placed in this encounter.   INTERVAL HISTORY: Please see below for problem oriented charting. She returns for further follow-up She denies new lymphadenopathy She has recent cough/upper URI but is improving She is up-to-date with age-appropriate screening She denies recent skin lesions.  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 Needle biopsy was inconclusive.      08/02/2011 Imaging    CT scan of the chest abdomen and pelvis showed numerous lymphadenopathy above and below the diaphragm.     08/16/2011 Imaging    MRI of the abdomen revealed enlarging left kidney lesion worrisome for malignancy    09/20/2011 Initial Diagnosis    Lymphoma, low grade from kidney biopsy, follicular    1/65/5374 Procedure    Histologic type: Non-Hodgkin's lymphoma, follicular center cell type. Grade (if applicable): Favor high grade, Ki-67 ranges from 10-50%. Immunohistochemical stains: CD20, CD79a, CD10, BCL-2, BCL-6, CD21, CD3, CD43.    05/09/2013 Procedure    BM biopsy is highly suspicious of involvement. Patient had persistent leukopenia    05/09/2013 Imaging    PET/CT showed significant involvement of LN everywhere    07/16/2013 - 11/05/2013 Chemotherapy    She completed 6 cycles of R. CHOP chemotherapy.    07/23/2013 - 07/27/2013 Hospital Admission    Admitted for management of mucositis and neutropenic fever. Blood culture was positive for gram negative rods, resolved with antibiotics    10/08/2013 Adverse Reaction    The dose of vincristine is reduced by 50%    12/15/2013 Imaging    Repeat PET CT scan show complete response to treatment.    12/17/2013 - 10/20/2015 Chemotherapy    She begin maintenance rituximab every other month x 2 years    02/23/2014 Procedure    She had shave biopsy of the left upper, that  came back well-differentiated squamous cell carcinoma.    04/29/2014 Imaging    CT scan of the abdomen showed no evidence of disease recurrence.    10/12/2014 Imaging    Repeat CT scan of the chest, abdomen and pelvis showed no evidence of recurrence.    10/19/2015 Imaging    Ct scan showed no evidence of lymphoma    06/08/2017 Imaging    MRI lumbar Transitional lumbosacral anatomy.  L5  incorporated into the sacrum  Mild degenerative change L2-3 without stenosis  Mild anterolisthesis L3-4 with facet degeneration and subarticular stenosis bilaterally  4 mm anterolisthesis L4-5 with facet degeneration and mild subarticular stenosis bilaterally    06/08/2017 Imaging    MRI left hip 1. Left trochanteric bursitis. 2. Abnormal edema in the left quadrate is femoris muscle probably from muscle strain. 3. Axial loss of articular cartilage thickness in both hips with mild associated spurring of the femoral heads. 4. Small amount of nonspecific free pelvic fluid.    10/18/2017 Imaging    1. Stable small scattered supraclavicular, mediastinal and mesenteric lymph nodes but no findings worrisome for recurrent lymphoma. 2. Single small subcutaneous lesion in the lower abdominal area has decreased in size since the prior study. No new lesions. 3. No acute abdominal or pelvic findings. No acute pulmonary findings. 4. Stable age advanced vascular calcifications.     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     REVIEW OF SYSTEMS:   Constitutional: Denies fevers, chills or abnormal weight loss Eyes: Denies blurriness of vision Ears, nose, mouth, throat, and face: Denies mucositis or sore throat Respiratory: Denies cough, dyspnea or wheezes Cardiovascular: Denies palpitation, chest discomfort or lower extremity swelling Gastrointestinal:  Denies nausea, heartburn or change in bowel habits Skin: Denies abnormal skin rashes Lymphatics: Denies new lymphadenopathy or easy bruising Neurological:Denies numbness, tingling or new weaknesses Behavioral/Psych: Mood is stable, no new  changes  All other systems were reviewed with the patient and are negative.  I have reviewed the past medical history, past surgical history, social history and family history with the patient and they are unchanged from previous note.  ALLERGIES:  is allergic to latex.  MEDICATIONS:  Current Outpatient Medications  Medication Sig Dispense Refill  . Benzonatate 150 MG CAPS Take 150 mg by mouth 2 (two) times daily.    Marland Kitchen ezetimibe (ZETIA) 10 MG tablet Take 10 mg by mouth daily.    Marland Kitchen acetaminophen (TYLENOL) 500 MG tablet Take 250 mg by mouth every 6 (six) hours as needed for moderate pain.    Marland Kitchen amLODipine (NORVASC) 5 MG tablet Take 10 mg by mouth at bedtime.    Marland Kitchen aspirin 81 MG tablet Take 81 mg by mouth at bedtime.     . B Complex Vitamins (B-COMPLEX/B-12 PO) Take by mouth.    Marland Kitchen CALCIUM-MAGNESIUM-ZINC PO Take 1 tablet by mouth daily.     . Cholecalciferol (VITAMIN D) 2000 UNITS tablet Take 2,000 Units by mouth daily.    . clobetasol cream (TEMOVATE) 2.35 % Apply 1 application topically 2 (two) times daily.    . Cyanocobalamin (VITAMIN B 12 PO) Take 1 tablet by mouth daily.    . famotidine (PEPCID) 20 MG tablet  Take 20 mg by mouth 2 (two) times daily.    Marland Kitchen loratadine (CLARITIN) 10 MG tablet Take 10 mg by mouth daily.    . Omega-3 Fatty Acids (FISH OIL) 1000 MG CAPS Take by mouth 2 (two) times daily.    . polyethylene glycol (MIRALAX / GLYCOLAX) packet Take 17 g by mouth daily.    . Probiotic Product (PROBIOTIC DAILY PO) Take 1 capsule by mouth daily.    Marland Kitchen pyridOXINE (VITAMIN B-6) 100 MG tablet Take 100 mg by mouth daily.     Marland Kitchen spironolactone (ALDACTONE) 25 MG tablet Take 1 tablet by mouth daily.     No current facility-administered medications for this visit.     PHYSICAL EXAMINATION: ECOG PERFORMANCE STATUS: 1 - Symptomatic but completely ambulatory  Vitals:   08/26/18 0828  BP: (!) 141/68  Pulse: 70  Resp: 18  Temp: 98 F (36.7 C)  SpO2: 100%   Filed Weights   08/26/18 0828   Weight: 182 lb 3.2 oz (82.6 kg)    GENERAL:alert, no distress and comfortable SKIN: skin color, texture, turgor are normal, no rashes or significant lesions EYES: normal, Conjunctiva are pink and non-injected, sclera clear OROPHARYNX:no exudate, no erythema and lips, buccal mucosa, and tongue normal  NECK: supple, thyroid normal size, non-tender, without nodularity LYMPH:  no palpable lymphadenopathy in the cervical, axillary or inguinal LUNGS: clear to auscultation and percussion with normal breathing effort HEART: regular rate & rhythm and no murmurs and no lower extremity edema ABDOMEN:abdomen soft, non-tender and normal bowel sounds Musculoskeletal:no cyanosis of digits and no clubbing  NEURO: alert & oriented x 3 with fluent speech, no focal motor/sensory deficits  LABORATORY DATA:  I have reviewed the data as listed    Component Value Date/Time   NA 141 01/17/2018 0745   NA 140 01/18/2017 0757   K 3.4 (L) 01/17/2018 0745   K 4.1 01/18/2017 0757   CL 103 01/17/2018 0745   CO2 30 (H) 01/17/2018 0745   CO2 25 01/18/2017 0757   GLUCOSE 94 01/17/2018 0745   GLUCOSE 94 01/18/2017 0757   BUN 19 01/17/2018 0745   BUN 14.0 01/18/2017 0757   CREATININE 1.04 01/17/2018 0745   CREATININE 1.2 (H) 01/18/2017 0757   CALCIUM 9.8 01/17/2018 0745   CALCIUM 9.3 01/18/2017 0757   PROT 6.9 01/17/2018 0745   PROT 7.0 01/18/2017 0757   ALBUMIN 4.4 01/17/2018 0745   ALBUMIN 4.3 01/18/2017 0757   AST 28 01/17/2018 0745   AST 22 01/18/2017 0757   ALT 24 01/17/2018 0745   ALT 19 01/18/2017 0757   ALKPHOS 71 01/17/2018 0745   ALKPHOS 60 01/18/2017 0757   BILITOT 0.6 01/17/2018 0745   BILITOT 1.13 01/18/2017 0757   GFRNONAA 52 (L) 01/17/2018 0745   GFRAA >60 01/17/2018 0745    No results found for: SPEP, UPEP  Lab Results  Component Value Date   WBC 3.3 (L) 08/26/2018   NEUTROABS 1.6 (L) 08/26/2018   HGB 12.3 08/26/2018   HCT 37.3 08/26/2018   MCV 84.0 08/26/2018   PLT 187  08/26/2018      Chemistry      Component Value Date/Time   NA 141 01/17/2018 0745   NA 140 01/18/2017 0757   K 3.4 (L) 01/17/2018 0745   K 4.1 01/18/2017 0757   CL 103 01/17/2018 0745   CO2 30 (H) 01/17/2018 0745   CO2 25 01/18/2017 0757   BUN 19 01/17/2018 0745   BUN 14.0 01/18/2017 0757  CREATININE 1.04 01/17/2018 0745   CREATININE 1.2 (H) 01/18/2017 0757      Component Value Date/Time   CALCIUM 9.8 01/17/2018 0745   CALCIUM 9.3 01/18/2017 0757   ALKPHOS 71 01/17/2018 0745   ALKPHOS 60 01/18/2017 0757   AST 28 01/17/2018 0745   AST 22 01/18/2017 0757   ALT 24 01/17/2018 0745   ALT 19 01/18/2017 0757   BILITOT 0.6 01/17/2018 0745   BILITOT 1.13 01/18/2017 0757       All questions were answered. The patient knows to call the clinic with any problems, questions or concerns. No barriers to learning was detected.  I spent 15 minutes counseling the patient face to face. The total time spent in the appointment was 20 minutes and more than 50% was on counseling and review of test results  Heath Lark, MD 08/26/2018 9:24 AM

## 2018-08-27 DIAGNOSIS — J069 Acute upper respiratory infection, unspecified: Secondary | ICD-10-CM | POA: Diagnosis not present

## 2018-09-10 DIAGNOSIS — M79674 Pain in right toe(s): Secondary | ICD-10-CM | POA: Diagnosis not present

## 2018-09-10 DIAGNOSIS — B351 Tinea unguium: Secondary | ICD-10-CM | POA: Diagnosis not present

## 2018-09-16 DIAGNOSIS — I10 Essential (primary) hypertension: Secondary | ICD-10-CM | POA: Diagnosis not present

## 2018-09-16 DIAGNOSIS — M79606 Pain in leg, unspecified: Secondary | ICD-10-CM | POA: Diagnosis not present

## 2018-10-02 DIAGNOSIS — K648 Other hemorrhoids: Secondary | ICD-10-CM | POA: Diagnosis not present

## 2018-10-16 DIAGNOSIS — K648 Other hemorrhoids: Secondary | ICD-10-CM | POA: Diagnosis not present

## 2018-10-16 DIAGNOSIS — K644 Residual hemorrhoidal skin tags: Secondary | ICD-10-CM | POA: Diagnosis not present

## 2018-10-16 DIAGNOSIS — R21 Rash and other nonspecific skin eruption: Secondary | ICD-10-CM | POA: Diagnosis not present

## 2018-10-28 DIAGNOSIS — J069 Acute upper respiratory infection, unspecified: Secondary | ICD-10-CM | POA: Diagnosis not present

## 2018-11-11 DIAGNOSIS — Z0000 Encounter for general adult medical examination without abnormal findings: Secondary | ICD-10-CM | POA: Diagnosis not present

## 2018-11-19 DIAGNOSIS — M79674 Pain in right toe(s): Secondary | ICD-10-CM | POA: Diagnosis not present

## 2018-11-19 DIAGNOSIS — B351 Tinea unguium: Secondary | ICD-10-CM | POA: Diagnosis not present

## 2018-11-23 DIAGNOSIS — Z23 Encounter for immunization: Secondary | ICD-10-CM | POA: Diagnosis not present

## 2018-11-25 DIAGNOSIS — R112 Nausea with vomiting, unspecified: Secondary | ICD-10-CM | POA: Diagnosis not present

## 2018-11-25 DIAGNOSIS — I10 Essential (primary) hypertension: Secondary | ICD-10-CM | POA: Diagnosis not present

## 2018-11-25 DIAGNOSIS — M25551 Pain in right hip: Secondary | ICD-10-CM | POA: Diagnosis not present

## 2018-11-28 DIAGNOSIS — H2513 Age-related nuclear cataract, bilateral: Secondary | ICD-10-CM | POA: Diagnosis not present

## 2018-11-28 DIAGNOSIS — H04123 Dry eye syndrome of bilateral lacrimal glands: Secondary | ICD-10-CM | POA: Diagnosis not present

## 2018-11-28 DIAGNOSIS — H43812 Vitreous degeneration, left eye: Secondary | ICD-10-CM | POA: Diagnosis not present

## 2019-01-07 DIAGNOSIS — L280 Lichen simplex chronicus: Secondary | ICD-10-CM | POA: Diagnosis not present

## 2019-01-28 DIAGNOSIS — B351 Tinea unguium: Secondary | ICD-10-CM | POA: Diagnosis not present

## 2019-01-28 DIAGNOSIS — M79674 Pain in right toe(s): Secondary | ICD-10-CM | POA: Diagnosis not present

## 2019-02-12 DIAGNOSIS — K648 Other hemorrhoids: Secondary | ICD-10-CM | POA: Diagnosis not present

## 2019-02-20 DIAGNOSIS — H2512 Age-related nuclear cataract, left eye: Secondary | ICD-10-CM | POA: Diagnosis not present

## 2019-02-20 DIAGNOSIS — H25012 Cortical age-related cataract, left eye: Secondary | ICD-10-CM | POA: Diagnosis not present

## 2019-02-20 DIAGNOSIS — H25812 Combined forms of age-related cataract, left eye: Secondary | ICD-10-CM | POA: Diagnosis not present

## 2019-02-24 DIAGNOSIS — I10 Essential (primary) hypertension: Secondary | ICD-10-CM | POA: Diagnosis not present

## 2019-02-24 DIAGNOSIS — C8239 Follicular lymphoma grade IIIa, extranodal and solid organ sites: Secondary | ICD-10-CM | POA: Diagnosis not present

## 2019-02-27 DIAGNOSIS — Z8589 Personal history of malignant neoplasm of other organs and systems: Secondary | ICD-10-CM | POA: Diagnosis not present

## 2019-02-27 DIAGNOSIS — L819 Disorder of pigmentation, unspecified: Secondary | ICD-10-CM | POA: Diagnosis not present

## 2019-02-27 DIAGNOSIS — L439 Lichen planus, unspecified: Secondary | ICD-10-CM | POA: Diagnosis not present

## 2019-03-04 ENCOUNTER — Other Ambulatory Visit: Payer: Medicare Other

## 2019-03-04 ENCOUNTER — Other Ambulatory Visit: Payer: Self-pay

## 2019-03-04 DIAGNOSIS — Z20822 Contact with and (suspected) exposure to covid-19: Secondary | ICD-10-CM

## 2019-03-04 DIAGNOSIS — R6889 Other general symptoms and signs: Secondary | ICD-10-CM | POA: Diagnosis not present

## 2019-03-08 LAB — NOVEL CORONAVIRUS, NAA: SARS-CoV-2, NAA: NOT DETECTED

## 2019-03-17 ENCOUNTER — Telehealth: Payer: Self-pay | Admitting: *Deleted

## 2019-03-17 NOTE — Telephone Encounter (Signed)
Patient is calling for her test results- she has not heard anything- patient notified they are negative. Patient is doing well today- she will follow up with symptoms.

## 2019-04-07 DIAGNOSIS — L280 Lichen simplex chronicus: Secondary | ICD-10-CM | POA: Diagnosis not present

## 2019-04-08 DIAGNOSIS — B351 Tinea unguium: Secondary | ICD-10-CM | POA: Diagnosis not present

## 2019-04-08 DIAGNOSIS — M79674 Pain in right toe(s): Secondary | ICD-10-CM | POA: Diagnosis not present

## 2019-04-14 ENCOUNTER — Other Ambulatory Visit: Payer: Self-pay

## 2019-05-01 DIAGNOSIS — Z8589 Personal history of malignant neoplasm of other organs and systems: Secondary | ICD-10-CM | POA: Diagnosis not present

## 2019-05-01 DIAGNOSIS — L819 Disorder of pigmentation, unspecified: Secondary | ICD-10-CM | POA: Diagnosis not present

## 2019-05-01 DIAGNOSIS — L439 Lichen planus, unspecified: Secondary | ICD-10-CM | POA: Diagnosis not present

## 2019-05-01 DIAGNOSIS — L249 Irritant contact dermatitis, unspecified cause: Secondary | ICD-10-CM | POA: Diagnosis not present

## 2019-05-13 DIAGNOSIS — Z23 Encounter for immunization: Secondary | ICD-10-CM | POA: Diagnosis not present

## 2019-05-20 ENCOUNTER — Other Ambulatory Visit: Payer: Self-pay | Admitting: Family Medicine

## 2019-05-20 DIAGNOSIS — Z1231 Encounter for screening mammogram for malignant neoplasm of breast: Secondary | ICD-10-CM

## 2019-05-21 DIAGNOSIS — I10 Essential (primary) hypertension: Secondary | ICD-10-CM | POA: Diagnosis not present

## 2019-05-21 DIAGNOSIS — C8239 Follicular lymphoma grade IIIa, extranodal and solid organ sites: Secondary | ICD-10-CM | POA: Diagnosis not present

## 2019-05-27 DIAGNOSIS — I10 Essential (primary) hypertension: Secondary | ICD-10-CM | POA: Diagnosis not present

## 2019-05-27 DIAGNOSIS — R600 Localized edema: Secondary | ICD-10-CM | POA: Diagnosis not present

## 2019-06-14 ENCOUNTER — Other Ambulatory Visit: Payer: Self-pay

## 2019-06-14 ENCOUNTER — Emergency Department (HOSPITAL_COMMUNITY): Payer: Medicare Other

## 2019-06-14 ENCOUNTER — Emergency Department (HOSPITAL_COMMUNITY)
Admission: EM | Admit: 2019-06-14 | Discharge: 2019-06-14 | Disposition: A | Discharge status: Home / Self Care | Payer: Medicare Other | Attending: Emergency Medicine | Admitting: Emergency Medicine

## 2019-06-14 ENCOUNTER — Encounter (HOSPITAL_COMMUNITY): Payer: Self-pay | Admitting: Emergency Medicine

## 2019-06-14 DIAGNOSIS — R079 Chest pain, unspecified: Secondary | ICD-10-CM | POA: Diagnosis not present

## 2019-06-14 DIAGNOSIS — I10 Essential (primary) hypertension: Secondary | ICD-10-CM | POA: Insufficient documentation

## 2019-06-14 DIAGNOSIS — Z7982 Long term (current) use of aspirin: Secondary | ICD-10-CM | POA: Diagnosis not present

## 2019-06-14 DIAGNOSIS — Z79899 Other long term (current) drug therapy: Secondary | ICD-10-CM | POA: Diagnosis not present

## 2019-06-14 LAB — BASIC METABOLIC PANEL
Anion gap: 11 (ref 5–15)
BUN: 13 mg/dL (ref 8–23)
CO2: 23 mmol/L (ref 22–32)
Calcium: 10.1 mg/dL (ref 8.9–10.3)
Chloride: 106 mmol/L (ref 98–111)
Creatinine, Ser: 0.99 mg/dL (ref 0.44–1.00)
GFR calc Af Amer: >60 mL/min (ref >60)
GFR calc non Af Amer: 57 mL/min — ABNORMAL LOW (ref >60)
Glucose, Bld: 100 mg/dL — ABNORMAL HIGH (ref 70–99)
Potassium: 4.4 mmol/L (ref 3.5–5.1)
Sodium: 140 mmol/L (ref 135–145)

## 2019-06-14 LAB — CBC
HCT: 45.3 % (ref 36.0–46.0)
Hemoglobin: 15.1 g/dL — ABNORMAL HIGH (ref 12.0–15.0)
MCH: 28.9 pg (ref 26.0–34.0)
MCHC: 33.3 g/dL (ref 30.0–36.0)
MCV: 86.8 fL (ref 80.0–100.0)
Platelets: 217 10*3/uL (ref 150–400)
RBC: 5.22 MIL/uL — ABNORMAL HIGH (ref 3.87–5.11)
RDW: 13.8 % (ref 11.5–15.5)
WBC: 3.9 10*3/uL — ABNORMAL LOW (ref 4.0–10.5)
nRBC: 0 % (ref 0.0–0.2)

## 2019-06-14 LAB — TROPONIN I (HIGH SENSITIVITY)
Troponin I (High Sensitivity): 3 ng/L (ref <18)
Troponin I (High Sensitivity): 4 ng/L (ref <18)

## 2019-06-14 MED ORDER — PANTOPRAZOLE SODIUM 40 MG PO TBEC: 40.0000 mg | DELAYED_RELEASE_TABLET | Freq: Every day | ORAL | 0 refills | Status: DC

## 2019-06-14 MED ORDER — SODIUM CHLORIDE 0.9% FLUSH
3.0000 mL | Freq: Once | INTRAVENOUS | Status: AC
  Administered 2019-06-14: 3 mL via INTRAVENOUS

## 2019-06-14 NOTE — Discharge Instructions (Addendum)
Recommend calling your primary care doctor as well as your gastroenterologist to discuss the symptoms you are having today.  If you have worsening chest pain, any difficulty breathing, or other new concerning symptoms recommend returning to ER for reassessment.

## 2019-06-14 NOTE — ED Triage Notes (Signed)
Pt reports chest pain last several days at night with lying down. Worse after eating. Hx of GERD. Reports sore throat as well. Denies sick contacts, recent fever, SOB. VSS. NAD at present.

## 2019-06-14 NOTE — ED Provider Notes (Signed)
[facility]GENCY DEPARTMENT Provider Note   CSN: [id number] Arrival date & time: 06/14/19  2876     History   Chief Complaint Chief Complaint  Patient presents with  . Chest Pain    HPI [name] is a 73 y.o. female.  Presents emergency department with chief complaint of chest pain.  Patient states has noted symptoms over the last few days, states pain is worse at night and worse with laying flat.  Describes it as to 4 out of 10 in severity a achy pain.  Has not associated anything with food.  No nausea or vomiting.  No associated shortness of breath.  Has not occurred with exertion.  States she is had a mild sore throat over the past few weeks as well as a mild nonproductive cough for the past week.  No fevers or exposures to COVID-19.  She has no ongoing chest pain at this time.  States that she had a cardiac catheterization many years ago and was told that she had congenital abnormality but no blockages in her arteries.  Past medical history hypertension, gastric reflux disease, remote history of follicular lymphoma     HPI  Past Medical History:  Diagnosis Date  . 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 ANSERINE BURSITIS 03/30/2008   Qualifier: Diagnosis of  By: Aline Brochure MD, Dorothyann Peng    . Bilateral lower extremity edema 02/18/2014  . Chronic back pain greater than 3 months duration 10/11/2017  . Congenital coronary artery anomaly   . Cramp of limb 11/16/2008   Qualifier: Diagnosis of  By: Percival Spanish, MD, Farrel Gordon    . Drug-induced leukopenia ([place]) 01/19/2016  . Dysuria 05/10/2015  . Enlargement of lymph nodes 07/26/2011  . Fever 07/23/2013  . FINGER SPRAIN 01/06/2008   Qualifier: Diagnosis of  By: Aline Brochure MD, Dorothyann Peng    . Follicular lymphoma ([place]) 06/27/2013  . 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  B-cell, favor follicular NHL.  CD79a, CD20 and  CD10 positive.  Cyclin D1 negative.   . Fungal infection 08/01/2013  . GERD (gastroesophageal reflux disease)   . Hemorrhoids 08/05/2013  . Hepatic steatosis 10/20/2015  . History of B-cell lymphoma 09/25/2011   Core needle biopsy of left kidney mass--lower pole 09/30/11.  B-cell, favor follicular NHL.  CD79a, CD20 and CD10 positive.  Cyclin D1 negative.   Marland Kitchen HTN (hypertension)   . Hyperlipidemia   . Infection of lip 10/27/2013  . Insomnia 07/25/2013  . KNEE PAIN 03/30/2008   Qualifier: Diagnosis of  By: Aline Brochure MD, Dorothyann Peng    . Leukopenia 09/16/2013  . Lichen planus   . Lichen sclerosus 04/21/5725  . Liver mass, right lobe 07/27/2011   1st noted by [place] 04/18/05 and by MRI 05/07/06   . Mouth sores 07/18/2013  . Nausea without vomiting 05/12/2015  . 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 OVERWEIGHT 12/27/2009   Qualifier: Diagnosis of  By: Percival Spanish, MD, Farrel Gordon    . Pancytopenia due to antineoplastic chemotherapy ([place]) 09/16/2013  . Skin lesion of left leg 02/18/2014  . Skull deformity 10/08/2013  . 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 Syncope 10/14/2013  . Thyroid disease   . UTI (urinary tract infection) 11/10/2013    Patient Active Problem List   Diagnosis Date Noted  . Chronic back pain greater than 3 months duration  10/11/2017  . Quality of life palliative care encounter 01/19/2016  . Hepatic steatosis 10/20/2015  . 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 Preventative health care 06/23/2015  . Nausea without vomiting 05/12/2015  . Dysuria 05/10/2015  . 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 Skin lesion of left leg 02/18/2014  . Broken tooth 10/17/2013  . Syncope 10/14/2013  . Skull deformity 10/08/2013  . Lichen sclerosus 16/03/3709  . Pancytopenia due to antineoplastic chemotherapy ([place]) 09/16/2013  . 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 Hemorrhoids 08/05/2013  . Insomnia 07/25/2013  . Neoplasm of uncertain behavior of  liver and biliary passages   . History of B-cell lymphoma 09/25/2011  . Renal mass, left 08/07/2011  . Liver mass, right lobe 07/27/2011  . OVERWEIGHT 12/27/2009  . CRAMP OF LIMB 11/16/2008  . HYPERLIPIDEMIA 11/15/2008  . Essential hypertension, benign 11/15/2008  . CORONARY ARTERY ANOMALY, CONGENITAL 11/15/2008  . KNEE PAIN 03/30/2008  . ANSERINE BURSITIS 03/30/2008  . FINGER SPRAIN 01/06/2008    Past Surgical History:  Procedure Laterality Date  . ABDOMINAL HYSTERECTOMY    . BONE MARROW ASPIRATION  05/09/2013  . BONE MARROW BIOPSY  05/09/2013  . BREAST EXCISIONAL BIOPSY Bilateral    x4 1970s  . KNEE ARTHROSCOPY W/ DEBRIDEMENT  05/2011   right  . LYMPH NODE DISSECTION  x 3 different times   behind left ear 2012/ left clavical area  . MR BREAST BILATERAL  cyst removal  . TUBAL LIGATION       OB History   No obstetric history on file.      Home Medications    Prior to Admission medications   Medication Sig Start Date End Date Taking? Authorizing Provider  acetaminophen (TYLENOL) 500 MG tablet Take 250 mg by mouth every 6 (six) hours as needed for moderate pain.    [provider]  amLODipine (NORVASC) 5 MG tablet Take 10 mg by mouth at bedtime.    [provider]  aspirin 81 MG tablet Take 81 mg by mouth at bedtime.     [provider]  B Complex Vitamins (B-COMPLEX/B-12 PO) Take by mouth.    [provider]  Benzonatate 150 MG CAPS Take 150 mg by mouth 2 (two) times daily.    [provider]  CALCIUM-MAGNESIUM-ZINC PO Take 1 tablet by mouth daily.     [provider]  Cholecalciferol (VITAMIN D) 2000 UNITS tablet Take 2,000 Units by mouth daily.    [provider]  clobetasol cream (TEMOVATE) 6.26 % Apply 1 application topically 2 (two) times daily.    [provider]  Cyanocobalamin (VITAMIN B 12 PO) Take 1 tablet by mouth daily.    [provider]  ezetimibe (ZETIA) 10 MG tablet Take 10 mg  by mouth daily.    [provider]  famotidine (PEPCID) 20 MG tablet Take 20 mg by mouth 2 (two) times daily. 11/21/16   [provider]  loratadine (CLARITIN) 10 MG tablet Take 10 mg by mouth daily.    [provider]  Omega-3 Fatty Acids (FISH OIL) 1000 MG CAPS Take by mouth 2 (two) times daily.    [provider]  polyethylene glycol (MIRALAX / GLYCOLAX) packet Take 17 g by mouth daily.    [provider]  Probiotic Product (PROBIOTIC DAILY PO) Take 1 capsule by mouth daily.    [provider]  pyridOXINE (VITAMIN B-6) 100 MG tablet Take 100 mg by mouth daily.  [provider]  spironolactone (ALDACTONE) 25 MG tablet Take 1 tablet by mouth daily.    [provider]    Family History Family History  Problem Relation Age of Onset  . Cancer Mother        breast  . Stroke Mother   . Breast cancer Mother 78  . Cancer Father        pancreatic cancer  . Heart disease Father   . Cancer Brother        kidney cancer/brain tumor  . Heart disease Brother   . Coronary artery disease Brother   . Breast cancer Paternal Aunt   . Breast cancer Paternal Aunt     Social History Social History   Tobacco Use  . Smoking status: Never Smoker  . Smokeless tobacco: Never Used  Substance Use Topics  . Alcohol use: No  . Drug use: No     Allergies   Latex   Review of Systems Review of Systems  Constitutional: Negative for chills and fever.  HENT: Negative for ear pain and sore throat.   Eyes: Negative for pain and visual disturbance.  Respiratory: Negative for cough and shortness of breath.   Cardiovascular: Positive for chest pain. Negative for palpitations.  Gastrointestinal: Negative for abdominal pain and vomiting.  Genitourinary: Negative for dysuria and hematuria.  Musculoskeletal: Negative for arthralgias and back pain.  Skin: Negative for color change and rash.  Neurological: Negative for seizures and  syncope.  All other systems reviewed and are negative.    Physical Exam Updated Vital Signs BP (!) 152/75 (BP Location: Right Arm)   Pulse 86   Temp 98.2 F (36.8 C) (Oral)   Resp 16   SpO2 95%   Physical Exam Vitals signs and nursing note reviewed.  Constitutional:      General: She is not in acute distress.    Appearance: She is well-developed.  HENT:     Head: Normocephalic and atraumatic.  Eyes:     Conjunctiva/sclera: Conjunctivae normal.  Neck:     Musculoskeletal: Neck supple.  Cardiovascular:     Rate and Rhythm: Normal rate and regular rhythm.     Heart sounds: No murmur.  Pulmonary:     Effort: Pulmonary effort is normal. No respiratory distress.     Breath sounds: Normal breath sounds.  Abdominal:     Palpations: Abdomen is soft.     Tenderness: There is no abdominal tenderness.  Musculoskeletal:     Right lower leg: No edema.     Left lower leg: No edema.  Skin:    General: Skin is warm and dry.  Neurological:     Mental Status: She is alert.      ED Treatments / Results  Labs (all labs ordered are listed, but only abnormal results are displayed) Labs Reviewed  CBC - Abnormal; Notable for the following components:      Result Value   WBC 3.9 (*)    RBC 5.22 (*)    Hemoglobin 15.1 (*)    All other components within normal limits  BASIC METABOLIC PANEL  TROPONIN I (HIGH SENSITIVITY)    EKG EKG Interpretation  Date/Time:  Saturday June 14 2019 08:36:41 EDT Ventricular Rate:  81 PR Interval:  168 QRS Duration: 90 QT Interval:  368 QTC Calculation: 427 R Axis:   26 Text Interpretation:  Normal sinus rhythm Cannot rule out Anterior infarct , age undetermined Abnormal ECG No significant change since last tracing Confirmed by Roslynn Amble,  Wren Pryce [id number]) on 06/14/2019 9:24:00 AM   Radiology Dg Chest 2 View  Result Date: 06/14/2019 CLINICAL DATA:  Chest pain centrally radiating to back for several days. EXAM: CHEST - 2 VIEW COMPARISON:   01/25/2017 FINDINGS: Lungs are clear. Cardiomediastinal silhouette and remainder of the exam is unchanged. IMPRESSION: No active cardiopulmonary disease. Electronically Signed   By: Marin Olp M.D.   On: 06/14/2019 09:24    Procedures Procedures (including critical care time)  Medications Ordered in ED Medications  sodium chloride flush (NS) 0.9 % injection 3 mL (has no administration in time range)     Initial Impression / Assessment and Plan / ED Course  I have reviewed the triage vital signs and the nursing notes.  Pertinent labs & imaging results that were available during my care of the patient were reviewed by me and considered in my medical decision making (see chart for details).  Clinical Course as of Jun 13 1522  Sat Jun 14, 2019  1211 Rechecked, updated on results, remains symptom free and well appearing, will dc home   [RD]    Clinical Course User Index [RD] Lucrezia Starch, MD       73 year old lady who presents the ER with chest pain.  To my evaluation she is chest pain-free, well-appearing with normal vital signs.  EKG without ischemic changes, troponin x2 within normal limits, no delta troponin.  She has no associated shortness of breath, no tachypnea or hypoxia, highly doubt pulmonary embolism.  Chest x-ray is without pneumothorax or pneumonia.  Given association with pain at night and with lying flat, concerned this may be related to her gastroesophageal reflux disease.  Already on Pepcid. Patient requests trying alternative - will give trial of switching to PPI. I strongly recommend recheck with both gastroenterology and her primary doctor.  Reviewed strict return precautions with patient, will discharge home at this time.    After the discussed management above, the patient was determined to be safe for discharge.  The patient was in agreement with this plan and all questions regarding their care were answered.  ED return precautions were discussed and the  patient will return to the ED with any significant worsening of condition.    Final Clinical Impressions(s) / ED Diagnoses   Final diagnoses:  Chest pain, unspecified type    ED Discharge Orders    None       Lucrezia Starch, MD 06/14/19 1524

## 2019-06-14 NOTE — ED Notes (Signed)
Got patient into a gown on the monitor patient is resting with call bell in reach 

## 2019-06-17 DIAGNOSIS — B351 Tinea unguium: Secondary | ICD-10-CM | POA: Diagnosis not present

## 2019-06-17 DIAGNOSIS — M79674 Pain in right toe(s): Secondary | ICD-10-CM | POA: Diagnosis not present

## 2019-06-26 DIAGNOSIS — M1711 Unilateral primary osteoarthritis, right knee: Secondary | ICD-10-CM | POA: Diagnosis not present

## 2019-06-26 DIAGNOSIS — M25561 Pain in right knee: Secondary | ICD-10-CM | POA: Diagnosis not present

## 2019-07-01 ENCOUNTER — Other Ambulatory Visit: Payer: Self-pay

## 2019-07-01 ENCOUNTER — Ambulatory Visit
Admission: RE | Admit: 2019-07-01 | Discharge: 2019-07-01 | Disposition: A | Discharge status: Home / Self Care | Payer: Medicare Other | Source: Ambulatory Visit | Attending: Family Medicine | Admitting: Family Medicine

## 2019-07-01 DIAGNOSIS — Z1231 Encounter for screening mammogram for malignant neoplasm of breast: Secondary | ICD-10-CM | POA: Diagnosis not present

## 2019-07-02 DIAGNOSIS — K219 Gastro-esophageal reflux disease without esophagitis: Secondary | ICD-10-CM | POA: Diagnosis not present

## 2019-07-04 DIAGNOSIS — M25561 Pain in right knee: Secondary | ICD-10-CM | POA: Diagnosis not present

## 2019-07-04 DIAGNOSIS — M1711 Unilateral primary osteoarthritis, right knee: Secondary | ICD-10-CM | POA: Diagnosis not present

## 2019-07-10 DIAGNOSIS — M1711 Unilateral primary osteoarthritis, right knee: Secondary | ICD-10-CM | POA: Diagnosis not present

## 2019-07-19 ENCOUNTER — Encounter: Payer: Self-pay | Admitting: Cardiology

## 2019-07-19 DIAGNOSIS — I2510 Atherosclerotic heart disease of native coronary artery without angina pectoris: Secondary | ICD-10-CM | POA: Insufficient documentation

## 2019-07-19 DIAGNOSIS — E785 Hyperlipidemia, unspecified: Secondary | ICD-10-CM | POA: Insufficient documentation

## 2019-07-19 DIAGNOSIS — M7989 Other specified soft tissue disorders: Secondary | ICD-10-CM | POA: Insufficient documentation

## 2019-07-19 NOTE — Progress Notes (Signed)
Cardiology Office Note   Date:  07/22/2019   ID:  Narely, Nobles 1946-07-05, MRN [id number]  PCP:  [name], MD  Cardiologist:   No primary care provider on file.   No chief complaint on file.     History of Present Illness: [name] is a 73 y.o. female who presents for follow up of a coronary anomaly.  I last saw her in 2016.  She returns for follow-up. She was in the ED in October with chest pain.   I reviewed these records for this visit.    There was no evidence of ischemia.  The pain was felt to be GI.     Since then she was treated with Protonix and that helped.  However, her GI doctor did not want her taking this long-term and switched her to famotidine.  However, she is still having discomfort associated with this.  Her blood pressures not been well controlled.  She is able to do activities such as climb up and down stairs without bringing on any chest discomfort.  She is not exercising outside routinely because it was too hot.  She denies any new shortness of breath, PND or orthopnea.  Is not had any new palpitations, presyncope or syncope.  Her weights have been stable  Past Medical History:  Diagnosis Date  . 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 Chronic back pain greater than 3 months duration 10/11/2017  . Congenital coronary artery anomaly   . Drug-induced leukopenia ([place]) 01/19/2016  . Enlargement of lymph nodes 07/26/2011  . 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  B-cell, favor follicular NHL.  CD79a, CD20 and CD10 positive.  Cyclin D1 negative.   . Fungal infection 08/01/2013  . GERD (gastroesophageal reflux disease)   . Hemorrhoids 08/05/2013  . Hepatic steatosis 10/20/2015  . History of B-cell lymphoma 09/25/2011   Core needle biopsy of left kidney mass--lower pole 09/30/11.  B-cell, favor follicular NHL.  CD79a, CD20 and CD10 positive.  Cyclin  D1 negative.   Marland Kitchen HTN (hypertension)   . Hyperlipidemia   . Insomnia 07/25/2013  . Liver mass, right lobe 07/27/2011   1st noted by [place] 04/18/05 and by MRI 05/07/06   . 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 OVERWEIGHT 12/27/2009   Qualifier: Diagnosis of  By: Percival Spanish, MD, Farrel Gordon    . Pancytopenia due to antineoplastic chemotherapy ([place]) 09/16/2013  . Skin lesion of left leg 02/18/2014  . Skull deformity 10/08/2013  . 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 Syncope 10/14/2013  . Thyroid disease     Past Surgical History:  Procedure Laterality Date  . ABDOMINAL HYSTERECTOMY    . BONE MARROW ASPIRATION  05/09/2013  . BONE MARROW BIOPSY  05/09/2013  . BREAST EXCISIONAL BIOPSY Bilateral    x4 1970s  . KNEE ARTHROSCOPY W/ DEBRIDEMENT  05/2011   right  . LYMPH NODE DISSECTION  x 3 different times   behind left ear 2012/ left clavical area  . MR BREAST BILATERAL  cyst removal  . TUBAL LIGATION       Current Outpatient Medications  Medication Sig Dispense Refill  . acetaminophen (TYLENOL) 500 MG tablet Take 250 mg by mouth every 6 (six) hours as needed for moderate pain.    Marland Kitchen amLODipine (NORVASC) 5 MG tablet Take 10 mg by  mouth at bedtime.    Marland Kitchen aspirin 81 MG tablet Take 81 mg by mouth at bedtime.     . B Complex Vitamins (B-COMPLEX/B-12 PO) Take by mouth.    . Benzonatate 150 MG CAPS Take 150 mg by mouth 2 (two) times daily.    Marland Kitchen CALCIUM-MAGNESIUM-ZINC PO Take 1 tablet by mouth daily.     . Cholecalciferol (VITAMIN D) 2000 UNITS tablet Take 2,000 Units by mouth daily.    . clobetasol cream (TEMOVATE) 6.64 % Apply 1 application topically 2 (two) times daily.    . Cyanocobalamin (VITAMIN B 12 PO) Take 1 tablet by mouth daily.    Marland Kitchen ezetimibe (ZETIA) 10 MG tablet Take 10 mg by mouth daily.    Marland Kitchen loratadine (CLARITIN) 10 MG tablet Take 10 mg by mouth daily.    . Omega-3 Fatty Acids (FISH OIL) 1000 MG CAPS Take by mouth 2 (two) times daily.    .  polyethylene glycol (MIRALAX / GLYCOLAX) packet Take 17 g by mouth daily.    . Probiotic Product (PROBIOTIC DAILY PO) Take 1 capsule by mouth daily.    Marland Kitchen pyridOXINE (VITAMIN B-6) 100 MG tablet Take 100 mg by mouth daily.     Marland Kitchen spironolactone (ALDACTONE) 25 MG tablet Take 1 tablet by mouth daily.    Marland Kitchen losartan (COZAAR) 25 MG tablet Take 1 tablet (25 mg total) by mouth daily. 90 tablet 3   No current facility-administered medications for this visit.     Allergies:   Latex    Social History:  The patient  reports that she has never smoked. She has never used smokeless tobacco. She reports that she does not drink alcohol or use drugs.   Family History:  The patient's family history includes Breast cancer in her paternal aunt and paternal aunt; Breast cancer (age of onset: 70) in her mother; Cancer in her brother, father, and mother; Coronary artery disease in her brother; Heart disease in her brother and father; Stroke in her mother.    ROS:  Please see the history of present illness.   Otherwise, review of systems are positive for GERD.   All other systems are reviewed and negative.    PHYSICAL EXAM: VS:  BP (!) 158/87   Pulse 82   Ht 5' 2" (1.575 m)   Wt 184 lb (83.5 kg)   BMI 33.65 kg/m  , BMI Body mass index is 33.65 kg/m. GENERAL:  Well appearing HEENT:  Pupils equal round and reactive, fundi not visualized, oral mucosa unremarkable NECK:  No jugular venous distention, waveform within normal limits, carotid upstroke brisk and symmetric, no bruits, no thyromegaly LYMPHATICS:  No cervical, inguinal adenopathy LUNGS:  Clear to auscultation bilaterally BACK:  No CVA tenderness CHEST:  Unremarkable HEART:  PMI not displaced or sustained,S1 and S2 within normal limits, no S3, no S4, no clicks, no rubs, no murmurs ABD:  Flat, positive bowel sounds normal in frequency in pitch, no bruits, no rebound, no guarding, no midline pulsatile mass, no hepatomegaly, no splenomegaly EXT:  2 plus  pulses throughout, mild left greater than right ankle edema, no cyanosis no clubbing SKIN:  No rashes no nodules NEURO:  Cranial nerves II through XII grossly intact, motor grossly intact throughout PSYCH:  Cognitively intact, oriented to person place and time    EKG:  EKG is ordered today. The ekg ordered today demonstrates sinus rhythm, rate 71, axis within normal limits, intervals within normal limits, no acute ST-T wave changes.  Recent Labs: 08/26/2018: ALT 19 06/14/2019: BUN 13; Creatinine, Ser 0.99; Hemoglobin 15.1; Platelets 217; Potassium 4.4; Sodium 140    Lipid Panel No results found for: CHOL, TRIG, HDL, CHOLHDL, VLDL, LDLCALC, LDLDIRECT    Wt Readings from Last 3 Encounters:  07/22/19 184 lb (83.5 kg)  06/14/19 182 lb (82.6 kg)  08/26/18 182 lb 3.2 oz (82.6 kg)      Other studies Reviewed: Additional studies/ records that were reviewed today include: Labs. Review of the above records demonstrates:  Please see elsewhere in the note.     ASSESSMENT AND PLAN:  CORONARY ARTERY ANOMALY, CONGENITAL No further evaluation  CHEST PAIN:  The patient's chest pain is atypical.  I reviewed the ED records.  I agree that it is likely GI.  No further cardiac work-up is suggested.  ESSENTIAL HYPERTENSION, BENIGN The blood pressure is not at target.  We will add Cozaar 25 mg daily.    HYPERLIPIDEMIA Her LDL was elevated previously.  She was going to diet and we will check again.  I suspect that she will need a statin and she and I talked about this.   HIGH RISK MED:   After completing R CHOP her EF was preserved.    EDEMA:    This is mild.  No change in therapy.  Current medicines are reviewed at length with the patient today.  The patient does not have concerns regarding medicines.  The following changes have been made: None  Labs/ tests ordered today include:   Orders Placed This Encounter  Procedures  . Lipid panel     Disposition:   FU with me in one  year.     Signed, Minus Breeding, MD  07/22/2019 3:51 PM    [facility]

## 2019-07-22 ENCOUNTER — Encounter: Payer: Self-pay | Admitting: Cardiology

## 2019-07-22 ENCOUNTER — Ambulatory Visit (INDEPENDENT_AMBULATORY_CARE_PROVIDER_SITE_OTHER): Payer: Medicare Other | Admitting: Cardiology

## 2019-07-22 ENCOUNTER — Other Ambulatory Visit: Payer: Self-pay

## 2019-07-22 VITALS — BP 158/87 | HR 82 | Ht 62.0 in | Wt 184.0 lb

## 2019-07-22 DIAGNOSIS — M7989 Other specified soft tissue disorders: Secondary | ICD-10-CM | POA: Diagnosis not present

## 2019-07-22 DIAGNOSIS — I2510 Atherosclerotic heart disease of native coronary artery without angina pectoris: Secondary | ICD-10-CM

## 2019-07-22 DIAGNOSIS — E785 Hyperlipidemia, unspecified: Secondary | ICD-10-CM | POA: Diagnosis not present

## 2019-07-22 DIAGNOSIS — I10 Essential (primary) hypertension: Secondary | ICD-10-CM

## 2019-07-22 MED ORDER — LOSARTAN POTASSIUM 25 MG PO TABS: 25.0000 mg | ORAL_TABLET | Freq: Every day | ORAL | 3 refills | Status: DC

## 2019-07-22 NOTE — Patient Instructions (Signed)
Medication Instructions:  START LOSARTAN 25 MG ONCE DAILY  *If you need a refill on your cardiac medications before your next appointment, please call your pharmacy*  Lab Work: Your physician recommends that you HAVE LAB WORK TODAY  If you have labs (blood work) drawn today and your tests are completely normal, you will receive your results only by: Marland Kitchen MyChart Message (if you have MyChart) OR . A paper copy in the mail If you have any lab test that is abnormal or we need to change your treatment, we will call you to review the results.  Follow-Up: At [facility], you and your health needs are our priority.  As part of our continuing mission to provide you with exceptional heart care, we have created designated Provider Care Teams.  These Care Teams include your primary Cardiologist (physician) and Advanced Practice Providers (APPs -  Physician Assistants and Nurse Practitioners) who all work together to provide you with the care you need, when you need it.  Your next appointment:   12 months  The format for your next appointment:   In Person  Provider:   Minus Breeding, MD

## 2019-07-23 LAB — LIPID PANEL
Chol/HDL Ratio: 3.8 ratio (ref 0.0–4.4)
Cholesterol, Total: 228 mg/dL — ABNORMAL HIGH (ref 100–199)
HDL: 60 mg/dL (ref >39)
LDL Chol Calc (NIH): 140 mg/dL — ABNORMAL HIGH (ref 0–99)
Triglycerides: 155 mg/dL — ABNORMAL HIGH (ref 0–149)
VLDL Cholesterol Cal: 28 mg/dL (ref 5–40)

## 2019-07-25 ENCOUNTER — Telehealth: Payer: Self-pay | Admitting: *Deleted

## 2019-07-25 DIAGNOSIS — E785 Hyperlipidemia, unspecified: Secondary | ICD-10-CM

## 2019-07-25 DIAGNOSIS — I10 Essential (primary) hypertension: Secondary | ICD-10-CM

## 2019-07-25 NOTE — Telephone Encounter (Signed)
-----   Message from Minus Breeding, MD sent at 07/24/2019  8:23 AM EST ----- She was going to pursue diet.  Her LDL is elevated.  I would suggest that she diet and then that she have this repeated in 3 months.  Call Ms. Owens Shark with the results and send results to [name], MD

## 2019-07-25 NOTE — Telephone Encounter (Signed)
Message in [place], mailed lab orders, and sent to PCP

## 2019-08-06 ENCOUNTER — Other Ambulatory Visit: Payer: Self-pay

## 2019-09-10 ENCOUNTER — Other Ambulatory Visit: Payer: Self-pay

## 2019-09-10 ENCOUNTER — Ambulatory Visit: Payer: Medicare Other | Attending: Internal Medicine

## 2019-09-10 DIAGNOSIS — Z20822 Contact with and (suspected) exposure to covid-19: Secondary | ICD-10-CM

## 2019-09-11 LAB — NOVEL CORONAVIRUS, NAA: SARS-CoV-2, NAA: NOT DETECTED

## 2019-09-17 DIAGNOSIS — H43812 Vitreous degeneration, left eye: Secondary | ICD-10-CM | POA: Diagnosis not present

## 2019-09-17 DIAGNOSIS — Z961 Presence of intraocular lens: Secondary | ICD-10-CM | POA: Diagnosis not present

## 2019-09-17 DIAGNOSIS — H2511 Age-related nuclear cataract, right eye: Secondary | ICD-10-CM | POA: Diagnosis not present

## 2019-09-17 DIAGNOSIS — H04123 Dry eye syndrome of bilateral lacrimal glands: Secondary | ICD-10-CM | POA: Diagnosis not present

## 2019-10-03 DIAGNOSIS — Z23 Encounter for immunization: Secondary | ICD-10-CM | POA: Diagnosis not present

## 2019-10-29 ENCOUNTER — Other Ambulatory Visit: Payer: Self-pay | Admitting: Cardiology

## 2019-10-29 DIAGNOSIS — I10 Essential (primary) hypertension: Secondary | ICD-10-CM | POA: Diagnosis not present

## 2019-10-29 DIAGNOSIS — E785 Hyperlipidemia, unspecified: Secondary | ICD-10-CM | POA: Diagnosis not present

## 2019-10-29 LAB — LIPID PANEL
Cholesterol: 238 mg/dL — ABNORMAL HIGH (ref <200)
HDL: 61 mg/dL (ref >=50)
LDL Cholesterol (Calc): 159 mg/dL (calc) — ABNORMAL HIGH
Non-HDL Cholesterol (Calc): 177 mg/dL (calc) — ABNORMAL HIGH (ref <130)
Total CHOL/HDL Ratio: 3.9 (calc) (ref <5.0)
Triglycerides: 81 mg/dL (ref <150)

## 2019-10-29 LAB — HEPATIC FUNCTION PANEL
AG Ratio: 2.8 (calc) — ABNORMAL HIGH (ref 1.0–2.5)
ALT: 16 U/L (ref 6–29)
AST: 19 U/L (ref 10–35)
Albumin: 5 g/dL (ref 3.6–5.1)
Alkaline phosphatase (APISO): 59 U/L (ref 37–153)
Bilirubin, Direct: 0.1 mg/dL (ref 0.0–0.2)
Globulin: 1.8 g/dL (calc) — ABNORMAL LOW (ref 1.9–3.7)
Indirect Bilirubin: 0.5 mg/dL (calc) (ref 0.2–1.2)
Total Bilirubin: 0.6 mg/dL (ref 0.2–1.2)
Total Protein: 6.8 g/dL (ref 6.1–8.1)

## 2019-11-03 DIAGNOSIS — Z23 Encounter for immunization: Secondary | ICD-10-CM | POA: Diagnosis not present

## 2019-11-04 DIAGNOSIS — B351 Tinea unguium: Secondary | ICD-10-CM | POA: Diagnosis not present

## 2019-11-04 DIAGNOSIS — M79674 Pain in right toe(s): Secondary | ICD-10-CM | POA: Diagnosis not present

## 2019-11-10 ENCOUNTER — Telehealth: Payer: Self-pay

## 2019-11-10 ENCOUNTER — Other Ambulatory Visit: Payer: Self-pay

## 2019-11-10 NOTE — Telephone Encounter (Signed)
Spoke with patient. See result note.  

## 2019-11-13 DIAGNOSIS — L819 Disorder of pigmentation, unspecified: Secondary | ICD-10-CM | POA: Diagnosis not present

## 2019-11-13 DIAGNOSIS — L853 Xerosis cutis: Secondary | ICD-10-CM | POA: Diagnosis not present

## 2019-11-13 DIAGNOSIS — L439 Lichen planus, unspecified: Secondary | ICD-10-CM | POA: Diagnosis not present

## 2019-11-17 DIAGNOSIS — E785 Hyperlipidemia, unspecified: Secondary | ICD-10-CM | POA: Diagnosis not present

## 2019-11-17 DIAGNOSIS — I10 Essential (primary) hypertension: Secondary | ICD-10-CM | POA: Diagnosis not present

## 2019-11-17 DIAGNOSIS — K219 Gastro-esophageal reflux disease without esophagitis: Secondary | ICD-10-CM | POA: Diagnosis not present

## 2019-11-20 DIAGNOSIS — Z0000 Encounter for general adult medical examination without abnormal findings: Secondary | ICD-10-CM | POA: Diagnosis not present

## 2019-12-08 DIAGNOSIS — R600 Localized edema: Secondary | ICD-10-CM | POA: Diagnosis not present

## 2019-12-08 DIAGNOSIS — E785 Hyperlipidemia, unspecified: Secondary | ICD-10-CM | POA: Diagnosis not present

## 2019-12-08 DIAGNOSIS — I10 Essential (primary) hypertension: Secondary | ICD-10-CM | POA: Diagnosis not present

## 2019-12-08 DIAGNOSIS — C8239 Follicular lymphoma grade IIIa, extranodal and solid organ sites: Secondary | ICD-10-CM | POA: Diagnosis not present

## 2019-12-29 DIAGNOSIS — J301 Allergic rhinitis due to pollen: Secondary | ICD-10-CM | POA: Diagnosis not present

## 2019-12-31 ENCOUNTER — Telehealth: Payer: Self-pay | Admitting: Cardiology

## 2019-12-31 DIAGNOSIS — R072 Precordial pain: Secondary | ICD-10-CM | POA: Insufficient documentation

## 2019-12-31 DIAGNOSIS — Z7189 Other specified counseling: Secondary | ICD-10-CM | POA: Insufficient documentation

## 2019-12-31 NOTE — Telephone Encounter (Signed)
STAT if HR is under 50 or over 120 (normal HR is 60-100 beats per minute)  1) What is your heart rate? 82 when sitting moving around it goes up as high as 101  2) Do you have a log of your heart rate readings (document readings)? No  3) Do you have any other symptoms?  soreness in chest and chest pain this morning

## 2019-12-31 NOTE — Telephone Encounter (Signed)
Spoke with pt who states her heart rate increases above 100 as she is moving around the house.  Pt states she woke at 4am this morning with CP that lasted about 5 minutes.  Pt denies other Sx and states she attributed to reflux but had not eaten since 730pm the night before.  Pt states she is taking medications as prescribed.  Appointment scheduled with Dr Percival Spanish for 01/01/2020 at 12pm.  Pt advised if she develops CP again, SOB, dizziness, weakness or fainting to go directly to ED for further evaluation.  Pt verbalizes understanding and agrees with current plan.

## 2019-12-31 NOTE — Progress Notes (Signed)
Cardiology Office Note   Date:  01/01/2020   ID:  Joyce, Kennedy 18-Aug-1946, MRN [id number]  PCP:  Joyce Beard, MD  Cardiologist:   Minus Breeding, MD   Chief Complaint  Patient presents with  . Chest Pain      History of Present Illness: [name] is a 74 y.o. female who presents for follow up of a coronary anomaly.  I last saw her in 2016.  She returns for follow-up. She was in the ED in October with chest pain.    There was no evidence of ischemia.  The pain was felt to be GI.     She called the other day and she was having tachycardia and chest pain.  She said the discomfort woke her from her sleep.  It was vague and she really could not quantify or qualify it.  She said it felt like reflux but she had not really eaten anything.  It did go away spontaneously.  She has had some chest soreness to touch or movement since then.  She tried getting on the exercise bike on Monday but she said her heart rate went up to 101 so she stopped.  She has been a little more short of breath.  She is not having PND or orthopnea classically.  She is not had any presyncope or syncope.  Of note I did go back to try to find her distant cath which was 2006.  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  Past Medical History:  Diagnosis Date  . 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 Chronic back pain greater than 3 months duration 10/11/2017  . Congenital coronary artery anomaly   . Drug-induced leukopenia ([place]) 01/19/2016  . Enlargement of lymph nodes 07/26/2011  . 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  B-cell, favor follicular NHL.  CD79a, CD20 and CD10 positive.  Cyclin D1 negative.   . Fungal infection 08/01/2013  . GERD (gastroesophageal reflux disease)   . Hemorrhoids 08/05/2013  . Hepatic  steatosis 10/20/2015  . History of B-cell lymphoma 09/25/2011   Core needle biopsy of left kidney mass--lower pole 09/30/11.  B-cell, favor follicular NHL.  CD79a, CD20 and CD10 positive.  Cyclin D1 negative.   Marland Kitchen HTN (hypertension)   . Hyperlipidemia   . Insomnia 07/25/2013  . Liver mass, right lobe 07/27/2011   1st noted by [place] 04/18/05 and by MRI 05/07/06   . 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 OVERWEIGHT 12/27/2009   Qualifier: Diagnosis of  By: Percival Spanish, MD, Farrel Gordon    . Pancytopenia due to antineoplastic chemotherapy ([place]) 09/16/2013  . Skin lesion of left leg 02/18/2014  . Skull deformity 10/08/2013  . 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 Syncope 10/14/2013  . Thyroid disease     Past Surgical History:  Procedure Laterality Date  . ABDOMINAL HYSTERECTOMY    . BONE MARROW ASPIRATION  05/09/2013  . BONE MARROW BIOPSY  05/09/2013  . BREAST EXCISIONAL BIOPSY Bilateral    x4 1970s  . KNEE ARTHROSCOPY W/ DEBRIDEMENT  05/2011   right  . LYMPH NODE DISSECTION  x 3 different times   behind left ear 2012/ left clavical area  . MR BREAST BILATERAL  cyst removal  .  TUBAL LIGATION       Current Outpatient Medications  Medication Sig Dispense Refill  . acetaminophen (TYLENOL) 500 MG tablet Take 250 mg by mouth every 6 (six) hours as needed for moderate pain.    Marland Kitchen amLODipine (NORVASC) 5 MG tablet Take 10 mg by mouth at bedtime.    Marland Kitchen aspirin 81 MG tablet Take 81 mg by mouth at bedtime.     Marland Kitchen atorvastatin (LIPITOR) 20 MG tablet Take 20 mg by mouth every other day.    . cetirizine (ZYRTEC) 10 MG tablet Take 10 mg by mouth daily.    . Cholecalciferol (VITAMIN D) 2000 UNITS tablet Take 2,000 Units by mouth daily.    . clobetasol cream (TEMOVATE) 0.99 % Apply 1 application topically 2 (two) times daily.    . Cyanocobalamin (VITAMIN B 12 PO) Take 1 tablet by mouth daily.    Marland Kitchen ezetimibe (ZETIA) 10 MG tablet Take 10 mg by mouth daily.    Marland Kitchen losartan  (COZAAR) 25 MG tablet Take 25 mg by mouth every other day.    . Multiple Vitamins-Minerals (ONE-A-DAY WOMENS 50 PLUS PO) Take 1 tablet by mouth daily.    . Omega-3 Fatty Acids (FISH OIL) 1000 MG CAPS Take by mouth 2 (two) times daily.    . pantoprazole (PROTONIX) 20 MG tablet Take 20 mg by mouth as needed.    . polyethylene glycol (MIRALAX / GLYCOLAX) packet Take 17 g by mouth daily.    . Probiotic Product (PROBIOTIC DAILY PO) Take 1 capsule by mouth daily.    Marland Kitchen spironolactone (ALDACTONE) 25 MG tablet Take 1 tablet by mouth daily.    . metoprolol tartrate (LOPRESSOR) 100 MG tablet Take 1 tablet (100 mg total) by mouth once for 1 dose. TAKE 2 HOURS BEFORE CT SCAN 1 tablet 0   No current facility-administered medications for this visit.    Allergies:   Latex    ROS:  Please see the history of present illness.   Otherwise, review of systems are positive for GERD.   All other systems are reviewed and negative.    PHYSICAL EXAM: VS:  BP 128/72   Pulse 61   Ht 5' 2"  (1.575 m)   Wt 178 lb 6.4 oz (80.9 kg)   SpO2 98%   BMI 32.63 kg/m  , BMI Body mass index is 32.63 kg/m. GENERAL:  Well appearing NECK:  No jugular venous distention, waveform within normal limits, carotid upstroke brisk and symmetric, no bruits, no thyromegaly LUNGS:  Clear to auscultation bilaterally CHEST:  Unremarkable HEART:  PMI not displaced or sustained,S1 and S2 within normal limits, no S3, no S4, no clicks, no rubs, no murmurs ABD:  Flat, positive bowel sounds normal in frequency in pitch, no bruits, no rebound, no guarding, no midline pulsatile mass, no hepatomegaly, no splenomegaly EXT:  2 plus pulses throughout, no edema, no cyanosis no clubbing   EKG:  EKG is  ordered today. The ekg ordered today demonstrates sinus rhythm, rate 64, axis within normal limits, intervals within normal limits, no acute ST-T wave changes.   Recent Labs: 06/14/2019: BUN 13; Creatinine, Ser 0.99; Hemoglobin 15.1; Platelets 217;  Potassium 4.4; Sodium 140 10/29/2019: ALT 16    Lipid Panel    Component Value Date/Time   CHOL 238 (H) 10/29/2019 0941   CHOL 228 (H) 07/22/2019 1449   TRIG 81 10/29/2019 0941   HDL 61 10/29/2019 0941   HDL 60 07/22/2019 1449   CHOLHDL 3.9 10/29/2019 0941  LDLCALC 159 (H) 10/29/2019 0941      Wt Readings from Last 3 Encounters:  01/01/20 178 lb 6.4 oz (80.9 kg)  07/22/19 184 lb (83.5 kg)  06/14/19 182 lb (82.6 kg)      Other studies Reviewed: Additional studies/ records that were reviewed today include: Cath  Review of the above records demonstrates:  Please see elsewhere in the note.     ASSESSMENT AND PLAN:  CORONARY ARTERY ANOMALY, CONGENITAL Given the patient's chest pain and questionable description of her left main anomalous artery I will order a coronary CTA to further evaluate.   CHEST PAIN:  The patient's chest pain is predominantly atypical.  I will evaluate this as above.   ESSENTIAL HYPERTENSION, BENIGN The blood pressure is at target.  No change in therapy.   HYPERLIPIDEMIA Her LDL was 159 in February.  Goals of therapy will be based on presence or absence of coronary disease.   EDEMA:     She has had mild edema intermittently.  However, no strongly suspect heart failure.   COVID EDUCATION: She did get the vaccine.  Current medicines are reviewed at length with the patient today.  The patient does not have concerns regarding medicines.  The following changes have been made: None  Labs/ tests ordered today include:   Orders Placed This Encounter  Procedures  . CT CORONARY MORPH W/CTA COR W/SCORE W/CA W/CM &/OR WO/CM  . CT CORONARY FRACTIONAL FLOW RESERVE DATA PREP  . CT CORONARY FRACTIONAL FLOW RESERVE FLUID ANALYSIS  . Basic Metabolic Panel (BMET)  . EKG 12-Lead     Disposition:   FU with me in one year.     Signed, Minus Breeding, MD  01/01/2020 12:54 PM    [facility]

## 2020-01-01 ENCOUNTER — Encounter: Payer: Self-pay | Admitting: Cardiology

## 2020-01-01 ENCOUNTER — Other Ambulatory Visit: Payer: Self-pay

## 2020-01-01 ENCOUNTER — Ambulatory Visit (INDEPENDENT_AMBULATORY_CARE_PROVIDER_SITE_OTHER): Payer: Medicare Other | Admitting: Cardiology

## 2020-01-01 VITALS — BP 128/72 | HR 61 | Ht 62.0 in | Wt 178.4 lb

## 2020-01-01 DIAGNOSIS — M7989 Other specified soft tissue disorders: Secondary | ICD-10-CM

## 2020-01-01 DIAGNOSIS — E785 Hyperlipidemia, unspecified: Secondary | ICD-10-CM | POA: Diagnosis not present

## 2020-01-01 DIAGNOSIS — Z7189 Other specified counseling: Secondary | ICD-10-CM

## 2020-01-01 DIAGNOSIS — I2510 Atherosclerotic heart disease of native coronary artery without angina pectoris: Secondary | ICD-10-CM

## 2020-01-01 DIAGNOSIS — I10 Essential (primary) hypertension: Secondary | ICD-10-CM | POA: Diagnosis not present

## 2020-01-01 DIAGNOSIS — R072 Precordial pain: Secondary | ICD-10-CM

## 2020-01-01 DIAGNOSIS — Z01812 Encounter for preprocedural laboratory examination: Secondary | ICD-10-CM | POA: Diagnosis not present

## 2020-01-01 MED ORDER — METOPROLOL TARTRATE 100 MG PO TABS: 100.0000 mg | ORAL_TABLET | Freq: Once | ORAL | 0 refills | Status: DC

## 2020-01-01 NOTE — Patient Instructions (Addendum)
Medication Instructions:  TAKE 100MG  OF METOPROLOL 2 HOURS BEFORE YOUR CT SCAN *If you need a refill on your cardiac medications before your next appointment, please call your pharmacy*  Lab Work: Your physician recommends that you return for lab work one week before your CT scan.  Testing/Procedures: Coronary CTA  Follow-Up: At [facility], you and your health needs are our priority.  As part of our continuing mission to provide you with exceptional heart care, we have created designated Provider Care Teams.  These Care Teams include your primary Cardiologist (physician) and Advanced Practice Providers (APPs -  Physician Assistants and Nurse Practitioners) who all work together to provide you with the care you need, when you need it.  Your next appointment:   4 month(s)  The format for your next appointment:   In Person  Provider:   You may see one of the following Advanced Practice Providers on your designated Care Team:    Rosaria Ferries, PA-C  Jory Sims, DNP, ANP  Coletta Memos, NP  Almyra Deforest, PA-C  Kerin Ransom, [place]  Other Instructions Your cardiac CT will be scheduled at one of the below locations:   [facility] [address] [place], [place] [address] [phone]  If scheduled at [facility], please arrive at the [facility] main entrance of [facility] 30 minutes prior to test start time. Proceed to the [facility] Radiology Department (first floor) to check-in and test prep.  Please follow these instructions carefully (unless otherwise directed):   On the Night Before the Test: . Be sure to Drink plenty of water. . Do not consume any caffeinated/decaffeinated beverages or chocolate 12 hours prior to your test. . Do not take any antihistamines 12 hours prior to your test.  On the Day of the Test: . Drink plenty of water. Do not drink any water within one hour of the test. . Do not eat any food 4 hours prior to the  test. . You may take your regular medications prior to the test.  . Take metoprolol (Lopressor) 100MG  two hours prior to test. . HOLD Spironolactone morning of the test. . FEMALES- please wear underwire-free bra if available    After the Test: . Drink plenty of water. . After receiving IV contrast, you may experience a mild flushed feeling. This is normal. . On occasion, you may experience a mild rash up to 24 hours after the test. This is not dangerous. If this occurs, you can take Benadryl 25 mg and increase your fluid intake. . If you experience trouble breathing, this can be serious. If it is severe call 911 IMMEDIATELY. If it is mild, please call our office. . If you take any of these medications: Glipizide/Metformin, Avandament, Glucavance, please do not take 48 hours after completing test unless otherwise instructed.   Once we have confirmed authorization from your insurance company, we will call you to set up a date and time for your test.   For non-scheduling related questions, please contact the cardiac imaging nurse navigator should you have any questions/concerns: Marchia Bond, RN Navigator Cardiac Imaging [facility] [phone] office  For scheduling needs, including cancellations and rescheduling, please call [phone].

## 2020-01-09 ENCOUNTER — Ambulatory Visit (INDEPENDENT_AMBULATORY_CARE_PROVIDER_SITE_OTHER): Payer: Medicare Other

## 2020-01-09 ENCOUNTER — Ambulatory Visit
Admission: EM | Admit: 2020-01-09 | Discharge: 2020-01-09 | Disposition: A | Discharge status: Home / Self Care | Payer: Medicare Other | Attending: Emergency Medicine | Admitting: Emergency Medicine

## 2020-01-09 DIAGNOSIS — S8992XA Unspecified injury of left lower leg, initial encounter: Secondary | ICD-10-CM | POA: Diagnosis not present

## 2020-01-09 DIAGNOSIS — M25562 Pain in left knee: Secondary | ICD-10-CM

## 2020-01-09 DIAGNOSIS — S86912A Strain of unspecified muscle(s) and tendon(s) at lower leg level, left leg, initial encounter: Secondary | ICD-10-CM

## 2020-01-09 DIAGNOSIS — E785 Hyperlipidemia, unspecified: Secondary | ICD-10-CM | POA: Diagnosis not present

## 2020-01-09 DIAGNOSIS — I10 Essential (primary) hypertension: Secondary | ICD-10-CM | POA: Diagnosis not present

## 2020-01-09 DIAGNOSIS — K219 Gastro-esophageal reflux disease without esophagitis: Secondary | ICD-10-CM | POA: Diagnosis not present

## 2020-01-09 NOTE — Discharge Instructions (Signed)
Continue conservative management of rest, ice, and gentle stretches Knee sleeve given Continue to alternate ibuprofen and/or tylenol as needed for pain and swelling Follow up with PCP if symptoms persist Return or go to the ER if you have any new or worsening symptoms (fever, chills, redness, bruising, swelling, worsening symptoms despite treatment, etc...)

## 2020-01-09 NOTE — ED Provider Notes (Signed)
[place]   [id number] 01/09/20 Arrival Time: 2426  CC: LT knee pain/ injury  SUBJECTIVE: History from: patient. [name] is a 74 y.o. female complains of left knee pain/ injury x 1 day.  Symptoms began after she misstepped while walking down stairs and fell landing on LT side.  Localizes the pain to the inside of left knee.  Describes the pain as intermittent and "pain" in character.  Has tried OTC ibuprofen with relief.  Symptoms are made worse with weight-bearing.  Denies similar symptoms in the past.  Denies fever, chills, erythema, ecchymosis, effusion, weakness, numbness and tingling.   ROS: As per HPI.  All other pertinent ROS negative.     Past Medical History:  Diagnosis Date  . 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 Chronic back pain greater than 3 months duration 10/11/2017  . Congenital coronary artery anomaly   . Drug-induced leukopenia ([place]) 01/19/2016  . Enlargement of lymph nodes 07/26/2011  . 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  B-cell, favor follicular NHL.  CD79a, CD20 and CD10 positive.  Cyclin D1 negative.   . Fungal infection 08/01/2013  . GERD (gastroesophageal reflux disease)   . Hemorrhoids 08/05/2013  . Hepatic steatosis 10/20/2015  . History of B-cell lymphoma 09/25/2011   Core needle biopsy of left kidney mass--lower pole 09/30/11.  B-cell, favor follicular NHL.  CD79a, CD20 and CD10 positive.  Cyclin D1 negative.   Marland Kitchen HTN (hypertension)   . Hyperlipidemia   . Insomnia 07/25/2013  . Liver mass, right lobe 07/27/2011   1st noted by [place] 04/18/05 and by MRI 05/07/06   . 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 OVERWEIGHT 12/27/2009   Qualifier: Diagnosis of  By: Percival Spanish, MD, Farrel Gordon    . Pancytopenia due to antineoplastic chemotherapy ([place]) 09/16/2013  . Skin lesion of left leg 02/18/2014  . Skull deformity  10/08/2013  . 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 Syncope 10/14/2013  . Thyroid disease    Past Surgical History:  Procedure Laterality Date  . ABDOMINAL HYSTERECTOMY    . BONE MARROW ASPIRATION  05/09/2013  . BONE MARROW BIOPSY  05/09/2013  . BREAST EXCISIONAL BIOPSY Bilateral    x4 1970s  . KNEE ARTHROSCOPY W/ DEBRIDEMENT  05/2011   right  . LYMPH NODE DISSECTION  x 3 different times   behind left ear 2012/ left clavical area  . MR BREAST BILATERAL  cyst removal  . TUBAL LIGATION     Allergies  Allergen Reactions  . Latex Swelling and Rash   No current facility-administered medications on file prior to encounter.   Current Outpatient Medications on File Prior to Encounter  Medication Sig Dispense Refill  . acetaminophen (TYLENOL) 500 MG tablet Take 250 mg by mouth every 6 (six) hours as needed for moderate pain.    Marland Kitchen amLODipine (NORVASC) 5 MG tablet Take 10 mg by mouth at bedtime.    Marland Kitchen aspirin 81 MG tablet Take 81 mg by mouth at bedtime.     Marland Kitchen atorvastatin (LIPITOR) 20 MG tablet Take 20 mg by mouth every other day.    . cetirizine (ZYRTEC) 10 MG tablet Take 10 mg by mouth daily.    . Cholecalciferol (VITAMIN D) 2000 UNITS tablet Take 2,000 Units by mouth daily.    . clobetasol cream (TEMOVATE) 8.34 % Apply 1 application  topically 2 (two) times daily.    . Cyanocobalamin (VITAMIN B 12 PO) Take 1 tablet by mouth daily.    Marland Kitchen ezetimibe (ZETIA) 10 MG tablet Take 10 mg by mouth daily.    Marland Kitchen losartan (COZAAR) 25 MG tablet Take 25 mg by mouth every other day.    . metoprolol tartrate (LOPRESSOR) 100 MG tablet Take 1 tablet (100 mg total) by mouth once for 1 dose. TAKE 2 HOURS BEFORE CT SCAN 1 tablet 0  . Multiple Vitamins-Minerals (ONE-A-DAY WOMENS 50 PLUS PO) Take 1 tablet by mouth daily.    . Omega-3 Fatty Acids (FISH OIL) 1000 MG CAPS Take by mouth 2 (two) times daily.    . pantoprazole (PROTONIX) 20 MG tablet Take 20 mg by mouth as needed.    . polyethylene glycol  (MIRALAX / GLYCOLAX) packet Take 17 g by mouth daily.    . Probiotic Product (PROBIOTIC DAILY PO) Take 1 capsule by mouth daily.    Marland Kitchen spironolactone (ALDACTONE) 25 MG tablet Take 1 tablet by mouth daily.     Social History   Socioeconomic History  . Marital status: Married    Spouse name: Not on file  . Number of children: Not on file  . Years of education: Not on file  . Highest education level: Not on file  Occupational History  . Occupation: [employer]  Tobacco Use  . Smoking status: Never Smoker  . Smokeless tobacco: Never Used  Substance and Sexual Activity  . Alcohol use: No  . Drug use: No  . Sexual activity: Never    Birth control/protection: None  Other Topics Concern  . Not on file  Social History Narrative  . Not on file   Social Determinants of Health   Financial Resource Strain:   . Difficulty of Paying Living Expenses:   Food Insecurity:   . Worried About Charity fundraiser in the Last Year:   . Arboriculturist in the Last Year:   Transportation Needs:   . Film/video editor (Medical):   Marland Kitchen Lack of Transportation (Non-Medical):   Physical Activity:   . Days of Exercise per Week:   . Minutes of Exercise per Session:   Stress:   . Feeling of Stress :   Social Connections:   . Frequency of Communication with Friends and Family:   . Frequency of Social Gatherings with Friends and Family:   . Attends Religious Services:   . Active Member of Clubs or Organizations:   . Attends Archivist Meetings:   Marland Kitchen Marital Status:   Intimate Partner Violence:   . Fear of Current or Ex-Partner:   . Emotionally Abused:   Marland Kitchen Physically Abused:   . Sexually Abused:    Family History  Problem Relation Age of Onset  . Cancer Mother        breast  . Stroke Mother   . Breast cancer Mother 72  . Cancer Father        pancreatic cancer  . Heart disease Father   . Cancer Brother        kidney cancer/brain tumor  . Heart disease Brother   . Coronary artery  disease Brother   . Breast cancer Paternal Aunt   . Breast cancer Paternal Aunt     OBJECTIVE:  Vitals:   01/09/20 1157  BP: 120/73  Pulse: 79  Resp: 18  Temp: 98.7 F (37.1 C)  TempSrc: Tympanic  SpO2: 95%    General appearance:  ALERT; in no acute distress.  Head: NCAT Lungs: Normal respiratory effort Musculoskeletal: LT knee Inspection: Skin warm, dry, clear and intact without obvious erythema, effusion, or ecchymosis.  Palpation: TTP over medial aspect of LT knee; tenderness with valgus stress ROM: FROM active and passive Strength:  5/5 knee flexion, 5/5 knee extension Skin: warm and dry Neurologic: Sitting wheel chair; Sensation intact about the lower extremities Psychological: alert and cooperative; normal mood and affect  DIAGNOSTIC STUDIES:  DG Knee Complete 4 Views Left  Result Date: 01/09/2020 CLINICAL DATA:  Fall EXAM: LEFT KNEE - COMPLETE 4+ VIEW COMPARISON:  None. FINDINGS: There is no acute fracture or dislocation. No joint effusion. Mild medial compartment joint space narrowing. Vascular calcification is present IMPRESSION: No acute fracture. Electronically Signed   By: Macy Mis M.D.   On: 01/09/2020 11:58    X-rays negative for bony abnormalities including fracture, or dislocation.  No soft tissue swelling.    I have reviewed the x-rays myself and the radiologist interpretation. I am in agreement with the radiologist interpretation.     ASSESSMENT & PLAN:  1. Acute pain of left knee   2. Injury of left knee, initial encounter   3. Strain of left knee, initial encounter    Continue conservative management of rest, ice, and gentle stretches Knee sleeve given Continue to alternate ibuprofen and/or tylenol as needed for pain and swelling Follow up with PCP if symptoms persist Return or go to the ER if you have any new or worsening symptoms (fever, chills, redness, bruising, swelling, worsening symptoms despite treatment, etc...)    Reviewed  expectations re: course of current medical issues. Questions answered. Outlined signs and symptoms indicating need for more acute intervention. Patient verbalized understanding. After Visit Summary given.    Lestine Box, PA-C 01/09/20 1221

## 2020-01-09 NOTE — ED Triage Notes (Signed)
Pt presents with complaints of left knee pain. States she fell last night while carrying things down the step. Reports she missed the last step and fell hurting her knee.

## 2020-01-20 DIAGNOSIS — M79674 Pain in right toe(s): Secondary | ICD-10-CM | POA: Diagnosis not present

## 2020-01-20 DIAGNOSIS — B351 Tinea unguium: Secondary | ICD-10-CM | POA: Diagnosis not present

## 2020-01-27 DIAGNOSIS — N390 Urinary tract infection, site not specified: Secondary | ICD-10-CM | POA: Diagnosis not present

## 2020-01-27 DIAGNOSIS — Z01812 Encounter for preprocedural laboratory examination: Secondary | ICD-10-CM | POA: Diagnosis not present

## 2020-01-28 ENCOUNTER — Other Ambulatory Visit (HOSPITAL_COMMUNITY): Payer: Self-pay | Admitting: *Deleted

## 2020-01-28 ENCOUNTER — Telehealth (HOSPITAL_COMMUNITY): Payer: Self-pay | Admitting: *Deleted

## 2020-01-28 LAB — BASIC METABOLIC PANEL
BUN/Creatinine Ratio: 18 (ref 12–28)
BUN: 19 mg/dL (ref 8–27)
CO2: 25 mmol/L (ref 20–29)
Calcium: 10.5 mg/dL — ABNORMAL HIGH (ref 8.7–10.3)
Chloride: 101 mmol/L (ref 96–106)
Creatinine, Ser: 1.05 mg/dL — ABNORMAL HIGH (ref 0.57–1.00)
GFR calc Af Amer: 60 mL/min/{1.73_m2} (ref >59)
GFR calc non Af Amer: 52 mL/min/{1.73_m2} — ABNORMAL LOW (ref >59)
Glucose: 84 mg/dL (ref 65–99)
Potassium: 4.6 mmol/L (ref 3.5–5.2)
Sodium: 140 mmol/L (ref 134–144)

## 2020-01-28 MED ORDER — METOPROLOL TARTRATE 100 MG PO TABS: 100.0000 mg | ORAL_TABLET | Freq: Once | ORAL | 0 refills | Status: DC

## 2020-01-28 NOTE — Telephone Encounter (Signed)
Pt returning call returning cardiac CT. Instructions reviewed and pt expressed instructions. Another metoprolol called in because pt did not get the first in a timely manner.

## 2020-01-28 NOTE — Telephone Encounter (Signed)
Attempted to call patient regarding upcoming cardiac CT appointment. Left message on voicemail with name and callback number  Merle Tai RN Navigator Cardiac Imaging [facility] [phone] Office [phone] Cell  

## 2020-01-29 ENCOUNTER — Ambulatory Visit (HOSPITAL_COMMUNITY)
Admission: RE | Admit: 2020-01-29 | Discharge: 2020-01-29 | Disposition: A | Discharge status: Home / Self Care | Payer: Medicare Other | Source: Ambulatory Visit | Attending: Cardiology | Admitting: Cardiology

## 2020-01-29 ENCOUNTER — Other Ambulatory Visit: Payer: Self-pay

## 2020-01-29 DIAGNOSIS — I2510 Atherosclerotic heart disease of native coronary artery without angina pectoris: Secondary | ICD-10-CM | POA: Diagnosis not present

## 2020-01-29 DIAGNOSIS — R072 Precordial pain: Secondary | ICD-10-CM | POA: Insufficient documentation

## 2020-01-29 MED ORDER — NITROGLYCERIN 0.4 MG SL SUBL
0.8000 mg | SUBLINGUAL_TABLET | Freq: Once | SUBLINGUAL | Status: AC
  Administered 2020-01-29: 0.8 mg via SUBLINGUAL

## 2020-01-29 MED ORDER — METOPROLOL TARTRATE 5 MG/5ML IV SOLN: 5.0000 mg | INTRAVENOUS | Status: DC | PRN

## 2020-01-29 MED ORDER — NITROGLYCERIN 0.4 MG SL SUBL
SUBLINGUAL_TABLET | SUBLINGUAL | Status: AC
  Filled 2020-01-29: qty 2

## 2020-01-29 MED ORDER — IOHEXOL 350 MG/ML SOLN
80.0000 mL | Freq: Once | INTRAVENOUS | Status: AC | PRN
  Administered 2020-01-29: 80 mL via INTRAVENOUS

## 2020-01-29 MED ORDER — METOPROLOL TARTRATE 5 MG/5ML IV SOLN
INTRAVENOUS | Status: AC
  Administered 2020-01-29: 5 mg via INTRAVENOUS
  Filled 2020-01-29: qty 10

## 2020-02-09 DIAGNOSIS — E785 Hyperlipidemia, unspecified: Secondary | ICD-10-CM | POA: Diagnosis not present

## 2020-02-09 DIAGNOSIS — K219 Gastro-esophageal reflux disease without esophagitis: Secondary | ICD-10-CM | POA: Diagnosis not present

## 2020-02-09 DIAGNOSIS — I10 Essential (primary) hypertension: Secondary | ICD-10-CM | POA: Diagnosis not present

## 2020-02-10 ENCOUNTER — Telehealth: Payer: Self-pay | Admitting: Cardiology

## 2020-02-10 DIAGNOSIS — R5383 Other fatigue: Secondary | ICD-10-CM | POA: Diagnosis not present

## 2020-02-10 DIAGNOSIS — Z8572 Personal history of non-Hodgkin lymphomas: Secondary | ICD-10-CM | POA: Diagnosis not present

## 2020-02-10 DIAGNOSIS — D519 Vitamin B12 deficiency anemia, unspecified: Secondary | ICD-10-CM | POA: Diagnosis not present

## 2020-02-10 DIAGNOSIS — M545 Low back pain: Secondary | ICD-10-CM | POA: Diagnosis not present

## 2020-02-10 DIAGNOSIS — Z7189 Other specified counseling: Secondary | ICD-10-CM | POA: Diagnosis not present

## 2020-02-10 DIAGNOSIS — E559 Vitamin D deficiency, unspecified: Secondary | ICD-10-CM | POA: Diagnosis not present

## 2020-02-10 DIAGNOSIS — G479 Sleep disorder, unspecified: Secondary | ICD-10-CM | POA: Diagnosis not present

## 2020-02-10 NOTE — Telephone Encounter (Signed)
Call came in asking about a fax that was faxed over on 01/13/20. I did not see any notes about a fax being received. Went to transfer call to medical records but call was dropped.

## 2020-02-11 DIAGNOSIS — N390 Urinary tract infection, site not specified: Secondary | ICD-10-CM | POA: Diagnosis not present

## 2020-02-17 DIAGNOSIS — I10 Essential (primary) hypertension: Secondary | ICD-10-CM | POA: Diagnosis not present

## 2020-02-18 DIAGNOSIS — M4316 Spondylolisthesis, lumbar region: Secondary | ICD-10-CM | POA: Diagnosis not present

## 2020-02-18 DIAGNOSIS — M5136 Other intervertebral disc degeneration, lumbar region: Secondary | ICD-10-CM | POA: Diagnosis not present

## 2020-02-18 DIAGNOSIS — M545 Low back pain: Secondary | ICD-10-CM | POA: Diagnosis not present

## 2020-02-24 DIAGNOSIS — Z0389 Encounter for observation for other suspected diseases and conditions ruled out: Secondary | ICD-10-CM | POA: Diagnosis not present

## 2020-02-24 DIAGNOSIS — Z780 Asymptomatic menopausal state: Secondary | ICD-10-CM | POA: Diagnosis not present

## 2020-02-25 DIAGNOSIS — M545 Low back pain: Secondary | ICD-10-CM | POA: Diagnosis not present

## 2020-03-19 DIAGNOSIS — M545 Low back pain: Secondary | ICD-10-CM | POA: Diagnosis not present

## 2020-03-26 DIAGNOSIS — M545 Low back pain: Secondary | ICD-10-CM | POA: Diagnosis not present

## 2020-03-30 DIAGNOSIS — B351 Tinea unguium: Secondary | ICD-10-CM | POA: Diagnosis not present

## 2020-03-30 DIAGNOSIS — M79674 Pain in right toe(s): Secondary | ICD-10-CM | POA: Diagnosis not present

## 2020-03-30 DIAGNOSIS — M79675 Pain in left toe(s): Secondary | ICD-10-CM | POA: Diagnosis not present

## 2020-03-31 DIAGNOSIS — M5136 Other intervertebral disc degeneration, lumbar region: Secondary | ICD-10-CM | POA: Diagnosis not present

## 2020-03-31 DIAGNOSIS — M170 Bilateral primary osteoarthritis of knee: Secondary | ICD-10-CM | POA: Diagnosis not present

## 2020-03-31 DIAGNOSIS — M1712 Unilateral primary osteoarthritis, left knee: Secondary | ICD-10-CM | POA: Diagnosis not present

## 2020-03-31 DIAGNOSIS — M545 Low back pain: Secondary | ICD-10-CM | POA: Diagnosis not present

## 2020-03-31 DIAGNOSIS — M25561 Pain in right knee: Secondary | ICD-10-CM | POA: Diagnosis not present

## 2020-03-31 DIAGNOSIS — M1711 Unilateral primary osteoarthritis, right knee: Secondary | ICD-10-CM | POA: Diagnosis not present

## 2020-04-06 ENCOUNTER — Ambulatory Visit
Admission: RE | Admit: 2020-04-06 | Discharge: 2020-04-06 | Disposition: A | Discharge status: Home / Self Care | Payer: Medicare Other | Source: Ambulatory Visit | Attending: Nurse Practitioner | Admitting: Nurse Practitioner

## 2020-04-06 ENCOUNTER — Other Ambulatory Visit: Payer: Self-pay | Admitting: Nurse Practitioner

## 2020-04-06 DIAGNOSIS — E669 Obesity, unspecified: Secondary | ICD-10-CM | POA: Diagnosis not present

## 2020-04-06 DIAGNOSIS — E785 Hyperlipidemia, unspecified: Secondary | ICD-10-CM | POA: Diagnosis not present

## 2020-04-06 DIAGNOSIS — R3915 Urgency of urination: Secondary | ICD-10-CM | POA: Diagnosis not present

## 2020-04-06 DIAGNOSIS — Z6831 Body mass index (BMI) 31.0-31.9, adult: Secondary | ICD-10-CM | POA: Diagnosis not present

## 2020-04-06 DIAGNOSIS — I10 Essential (primary) hypertension: Secondary | ICD-10-CM | POA: Diagnosis not present

## 2020-04-06 DIAGNOSIS — R062 Wheezing: Secondary | ICD-10-CM

## 2020-04-06 DIAGNOSIS — R5383 Other fatigue: Secondary | ICD-10-CM | POA: Diagnosis not present

## 2020-04-06 DIAGNOSIS — Z8572 Personal history of non-Hodgkin lymphomas: Secondary | ICD-10-CM | POA: Diagnosis not present

## 2020-04-06 DIAGNOSIS — R0609 Other forms of dyspnea: Secondary | ICD-10-CM | POA: Diagnosis not present

## 2020-04-06 DIAGNOSIS — R350 Frequency of micturition: Secondary | ICD-10-CM | POA: Diagnosis not present

## 2020-04-09 ENCOUNTER — Telehealth: Payer: Self-pay | Admitting: *Deleted

## 2020-04-09 DIAGNOSIS — I2510 Atherosclerotic heart disease of native coronary artery without angina pectoris: Secondary | ICD-10-CM

## 2020-04-09 NOTE — Telephone Encounter (Signed)
Spoke with pt, dr hochrein has signed the [facility] paperwork the patient brought by the office. Patient made aware dr hochrein would like her to see our genetic MD to go over the results. Referral placed. Paperwork mailed to the patients confirmed address.

## 2020-04-15 DIAGNOSIS — R350 Frequency of micturition: Secondary | ICD-10-CM | POA: Diagnosis not present

## 2020-04-15 DIAGNOSIS — J301 Allergic rhinitis due to pollen: Secondary | ICD-10-CM | POA: Diagnosis not present

## 2020-04-15 DIAGNOSIS — I10 Essential (primary) hypertension: Secondary | ICD-10-CM | POA: Diagnosis not present

## 2020-04-23 ENCOUNTER — Ambulatory Visit: Payer: Medicare Other | Admitting: Cardiology

## 2020-04-30 NOTE — Telephone Encounter (Signed)
Attempted to return call to patient. Unable to leave message at this time. Will retry later.

## 2020-04-30 NOTE — Telephone Encounter (Signed)
Follow up   Pt have question with the paper work she received for genetic MD.

## 2020-05-05 NOTE — Telephone Encounter (Signed)
Spoke with patient and advised would send Joyce Corns PhD message to see if anyone could call her and help her with forms. Also patient was inquiring about insurance paying, advised would include in message

## 2020-05-11 DIAGNOSIS — E785 Hyperlipidemia, unspecified: Secondary | ICD-10-CM | POA: Diagnosis not present

## 2020-05-11 DIAGNOSIS — K219 Gastro-esophageal reflux disease without esophagitis: Secondary | ICD-10-CM | POA: Diagnosis not present

## 2020-05-11 DIAGNOSIS — I10 Essential (primary) hypertension: Secondary | ICD-10-CM | POA: Diagnosis not present

## 2020-05-14 DIAGNOSIS — M5136 Other intervertebral disc degeneration, lumbar region: Secondary | ICD-10-CM | POA: Diagnosis not present

## 2020-05-14 DIAGNOSIS — M545 Low back pain: Secondary | ICD-10-CM | POA: Diagnosis not present

## 2020-05-18 NOTE — Progress Notes (Signed)
Cardiology Clinic Note   Patient Name: [name] Date of Encounter: 05/19/2020  Primary Care Provider:  [name], MD Primary Cardiologist:  Minus Breeding, MD  Patient Profile    [name] 74 year old female presents the clinic today for follow-up of her coronary anomaly.  Past Medical History    Past Medical History:  Diagnosis Date  . 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 Chronic back pain greater than 3 months duration 10/11/2017  . Congenital coronary artery anomaly   . Drug-induced leukopenia ([place]) 01/19/2016  . Enlargement of lymph nodes 07/26/2011  . 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  B-cell, favor follicular NHL.  CD79a, CD20 and CD10 positive.  Cyclin D1 negative.   . Fungal infection 08/01/2013  . GERD (gastroesophageal reflux disease)   . Hemorrhoids 08/05/2013  . Hepatic steatosis 10/20/2015  . History of B-cell lymphoma 09/25/2011   Core needle biopsy of left kidney mass--lower pole 09/30/11.  B-cell, favor follicular NHL.  CD79a, CD20 and CD10 positive.  Cyclin D1 negative.   Marland Kitchen HTN (hypertension)   . Hyperlipidemia   . Insomnia 07/25/2013  . Liver mass, right lobe 07/27/2011   1st noted by [place] 04/18/05 and by MRI 05/07/06   . 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 OVERWEIGHT 12/27/2009   Qualifier: Diagnosis of  By: Percival Spanish, MD, Farrel Gordon    . Pancytopenia due to antineoplastic chemotherapy ([place]) 09/16/2013  . Skin lesion of left leg 02/18/2014  . Skull deformity 10/08/2013  . 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 Syncope 10/14/2013  . Thyroid disease    Past Surgical History:  Procedure Laterality Date  . ABDOMINAL HYSTERECTOMY    . BONE MARROW ASPIRATION  05/09/2013  . BONE MARROW BIOPSY  05/09/2013  . BREAST EXCISIONAL BIOPSY Bilateral    x4 1970s  . KNEE ARTHROSCOPY W/ DEBRIDEMENT  05/2011    right  . LYMPH NODE DISSECTION  x 3 different times   behind left ear 2012/ left clavical area  . MR BREAST BILATERAL  cyst removal  . TUBAL LIGATION      Allergies  Allergies  Allergen Reactions  . Latex Swelling and Rash    History of Present Illness    [name] has a PMH of essential hypertension, congenital coronary artery anomaly, hemorrhoids, syncope, hepatic steatosis, hyperlipidemia, left renal mass, history of B-cell lymphoma, and lower extremity edema.  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  It was felt that it traveled between the pulmonary artery and aorta.  She was last seen by Dr. Percival Spanish on 01/01/2020.  During that time she complained of tachycardia and chest pain.  She indicated that the discomfort woke her up from her sleep.  Her symptoms were vague and she was not able to quantify her pain.  She felt her pain was related to acid reflux however she indicated that she had not eaten anything.  Her chest discomfort went away spontaneously.  She also indicated that since that time she had some chest soreness to the touch and with movement.  She indicated that she had tried to use her exercise bike that week and that her heart rate went up to 101 so she stopped her exercise.  She felt she was more short of breath.  She denied  PND and orthopnea.  She had not had any presyncope or syncope.  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  There did not appear to be any compression of the artery.  Nonobstructive CAD, right dominance, aortic atherosclerosis.  She presents the clinic today for follow-up evaluation and states she feels well.  She has had no episodes of chest pain.  She does state however that when she lays on her right side she notices chest discomfort.  When  she rolls to her left side her chest discomfort goes away.  She was at her PCP today 05/19/2020 and discussed her lower extremity edema.  He is having her hold amlodipine for 1 week to see whether the medication may be causing her lower extremity edema.  I will increase her losartan to 25 mg daily.  She states that she does not like to wear lower extremity support stockings.  She also indicates that she does not add salt to food however, when she has cramping she will eat table salt to help with her cramps.  We discussed the importance of avoiding salty foods, staying active, restricting fluids, and elevating her lower extremities to help with her lower extremity edema.  She expressed understanding.  I will have her follow-up in 1 year and give her the salty 6 diet sheet.  Today she denies chest pain, shortness of breath, lower extremity edema, fatigue, palpitations, melena, hematuria, hemoptysis, diaphoresis, weakness, presyncope, syncope, orthopnea, and PND.     Home Medications    Prior to Admission medications   Medication Sig Start Date End Date Taking? Authorizing Provider  acetaminophen (TYLENOL) 500 MG tablet Take 250 mg by mouth every 6 (six) hours as needed for moderate pain.    [provider]  amLODipine (NORVASC) 5 MG tablet Take 10 mg by mouth at bedtime.    [provider]  aspirin 81 MG tablet Take 81 mg by mouth at bedtime.     [provider]  atorvastatin (LIPITOR) 20 MG tablet Take 20 mg by mouth every other day. 12/18/19   [provider]  cetirizine (ZYRTEC) 10 MG tablet Take 10 mg by mouth daily.    [provider]  Cholecalciferol (VITAMIN D) 2000 UNITS tablet Take 2,000 Units by mouth daily.    [provider]  clobetasol cream (TEMOVATE) 2.45 % Apply 1 application topically 2 (two) times daily.    [provider]  Cyanocobalamin (VITAMIN B 12 PO) Take 1 tablet by mouth daily.    [provider]  ezetimibe  (ZETIA) 10 MG tablet Take 10 mg by mouth daily.    [provider]  losartan (COZAAR) 25 MG tablet Take 25 mg by mouth every other day.    [provider]  metoprolol tartrate (LOPRESSOR) 100 MG tablet Take 1 tablet (100 mg total) by mouth once for 1 dose. TAKE 2 HOURS BEFORE CT SCAN 01/28/20 01/28/20  Minus Breeding, MD  Multiple Vitamins-Minerals (ONE-A-DAY WOMENS 50 PLUS PO) Take 1 tablet by mouth daily.    [provider]  Omega-3 Fatty Acids (FISH OIL) 1000 MG CAPS Take by mouth 2 (two) times daily.    [provider]  pantoprazole (PROTONIX) 20 MG tablet Take 20 mg by mouth as needed.    [provider]  polyethylene glycol (MIRALAX / GLYCOLAX) packet Take 17 g by mouth daily.  [provider]  Probiotic Product (PROBIOTIC DAILY PO) Take 1 capsule by mouth daily.    [provider]  spironolactone (ALDACTONE) 25 MG tablet Take 1 tablet by mouth daily.    [provider]    Family History    Family History  Problem Relation Age of Onset  . Cancer Mother        breast  . Stroke Mother   . Breast cancer Mother 49  . Cancer Father        pancreatic cancer  . Heart disease Father   . Cancer Brother        kidney cancer/brain tumor  . Heart disease Brother   . Coronary artery disease Brother   . Breast cancer Paternal Aunt   . Breast cancer Paternal Aunt    She indicated that her mother is deceased. She indicated that her father is deceased. She indicated that her brother is alive. She indicated that her maternal grandmother is deceased. She indicated that her maternal grandfather is deceased. She indicated that her paternal grandmother is deceased. She indicated that her paternal grandfather is deceased. She indicated that her child is alive. She indicated that her other is alive.  Social History    Social History   Socioeconomic History  . Marital status: Married    Spouse name: Not on file  . Number of  children: Not on file  . Years of education: Not on file  . Highest education level: Not on file  Occupational History  . Occupation: [employer]  Tobacco Use  . Smoking status: Never Smoker  . Smokeless tobacco: Never Used  Substance and Sexual Activity  . Alcohol use: No  . Drug use: No  . Sexual activity: Never    Birth control/protection: None  Other Topics Concern  . Not on file  Social History Narrative  . Not on file   Social Determinants of Health   Financial Resource Strain:   . Difficulty of Paying Living Expenses: Not on file  Food Insecurity:   . Worried About Charity fundraiser in the Last Year: Not on file  . Ran Out of Food in the Last Year: Not on file  Transportation Needs:   . Lack of Transportation (Medical): Not on file  . Lack of Transportation (Non-Medical): Not on file  Physical Activity:   . Days of Exercise per Week: Not on file  . Minutes of Exercise per Session: Not on file  Stress:   . Feeling of Stress : Not on file  Social Connections:   . Frequency of Communication with Friends and Family: Not on file  . Frequency of Social Gatherings with Friends and Family: Not on file  . Attends Religious Services: Not on file  . Active Member of Clubs or Organizations: Not on file  . Attends Archivist Meetings: Not on file  . Marital Status: Not on file  Intimate Partner Violence:   . Fear of Current or Ex-Partner: Not on file  . Emotionally Abused: Not on file  . Physically Abused: Not on file  . Sexually Abused: Not on file     Review of Systems    General:  No chills, fever, night sweats or weight changes.  Cardiovascular:  No chest pain, dyspnea on exertion, edema, orthopnea, palpitations, paroxysmal nocturnal dyspnea. Dermatological: No rash, lesions/masses Respiratory: No cough, dyspnea Urologic: No hematuria, dysuria Abdominal:   No nausea, vomiting, diarrhea, bright red blood per rectum, melena, or hematemesis Neurologic:  No  visual changes, wkns, changes in mental status. All other systems reviewed and are otherwise negative except as noted above.  Physical Exam    VS:  BP 132/86   Pulse 71   Temp 98.2 F (36.8 C)   Ht 5' 2"  (1.575 m)   Wt 182 lb (82.6 kg)   SpO2 98%   BMI 33.29 kg/m  , BMI Body mass index is 33.29 kg/m. GEN: Well nourished, well developed, in no acute distress. HEENT: normal. Neck: Supple, no JVD, carotid bruits, or masses. Cardiac: RRR, no murmurs, rubs, or gallops. No clubbing, cyanosis, generalized bilateral lower extremity none pitting edema.  Radials/DP/PT 2+ and equal bilaterally.  Respiratory:  Respirations regular and unlabored, clear to auscultation bilaterally. GI: Soft, nontender, nondistended, BS + x 4. MS: no deformity or atrophy. Skin: warm and dry, no rash. Neuro:  Strength and sensation are intact. Psych: Normal affect.  Accessory Clinical Findings    Recent Labs: 06/14/2019: Hemoglobin 15.1; Platelets 217 10/29/2019: ALT 16 01/27/2020: BUN 19; Creatinine, Ser 1.05; Potassium 4.6; Sodium 140   Recent Lipid Panel    Component Value Date/Time   CHOL 238 (H) 10/29/2019 0941   CHOL 228 (H) 07/22/2019 1449   TRIG 81 10/29/2019 0941   HDL 61 10/29/2019 0941   HDL 60 07/22/2019 1449   CHOLHDL 3.9 10/29/2019 0941   LDLCALC 159 (H) 10/29/2019 0941    ECG personally reviewed by me today-normal sinus rhythm 71 bpm no ST or T wave deviation- No acute changes  IMPRESSION: 1. 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  2.  Minimal non-obstructive CAD, CADRADS = 1.  3. Coronary calcium score of 61. This was 65th percentile for age and sex matched control.  4.  Right dominance.  5.  Aortic atherosclerosis    Assessment & Plan   1.  Coronary artery anomaly-CTA showed anomalous left main arising from right  coronary ostium with anterior course.  Details listed above.  Calcium score 61, and nonobstructive CAD.  No compression noted. Reassured about findings  Chest discomfort-no recent episodes of chest discomfort/radiating chest pain.  Reassuring CTA.  Essential hypertension-BP today 132/86.  Well-controlled at home Continue, losartan, metoprolol, spironolactone Amlodipine stopped by PCP-lower extremity edema. Heart healthy low-sodium diet-salty 6 given Increase physical activity as tolerated  Hyperlipidemia-LDL 81 on 04/06/20 Continue atorvastatin, Zetia Heart healthy low-sodium diet-salty 6 given Increase physical activity as tolerated   Disposition: Follow-up with Dr. Percival Spanish or me in 12 months.  Jossie Ng. Latika Kronick NP-C    05/19/2020, 2:11 PM [facility] [place] [address] Office [phone] Fax [phone]  Notice: This dictation was prepared with Dragon dictation along with smaller phrase technology. Any transcriptional errors that result from this process are unintentional and may not be corrected upon review.

## 2020-05-19 ENCOUNTER — Ambulatory Visit (INDEPENDENT_AMBULATORY_CARE_PROVIDER_SITE_OTHER): Payer: Medicare Other | Admitting: General Practice

## 2020-05-19 ENCOUNTER — Other Ambulatory Visit: Payer: Self-pay

## 2020-05-19 ENCOUNTER — Encounter: Payer: Self-pay | Admitting: General Practice

## 2020-05-19 VITALS — BP 132/86 | HR 71 | Temp 98.2°F | Ht 62.0 in | Wt 182.0 lb

## 2020-05-19 DIAGNOSIS — I10 Essential (primary) hypertension: Secondary | ICD-10-CM | POA: Diagnosis not present

## 2020-05-19 DIAGNOSIS — R072 Precordial pain: Secondary | ICD-10-CM | POA: Diagnosis not present

## 2020-05-19 DIAGNOSIS — E785 Hyperlipidemia, unspecified: Secondary | ICD-10-CM

## 2020-05-19 DIAGNOSIS — Q245 Malformation of coronary vessels: Secondary | ICD-10-CM | POA: Diagnosis not present

## 2020-05-19 DIAGNOSIS — Z23 Encounter for immunization: Secondary | ICD-10-CM | POA: Diagnosis not present

## 2020-05-19 DIAGNOSIS — I2510 Atherosclerotic heart disease of native coronary artery without angina pectoris: Secondary | ICD-10-CM

## 2020-05-19 NOTE — Patient Instructions (Addendum)
Medication Instructions:  TAKE- Losartan 25 mg by mouth daily  *If you need a refill on your cardiac medications before your next appointment, please call your pharmacy*   Lab Work: None Ordered   Testing/Procedures: None Ordered   Follow-Up: At [facility], you and your health needs are our priority.  As part of our continuing mission to provide you with exceptional heart care, we have created designated Provider Care Teams.  These Care Teams include your primary Cardiologist (physician) and Advanced Practice Providers (APPs -  Physician Assistants and Nurse Practitioners) who all work together to provide you with the care you need, when you need it.  We recommend signing up for the patient portal called "MyChart".  Sign up information is provided on this After Visit Summary.  MyChart is used to connect with patients for Virtual Visits (Telemedicine).  Patients are able to view lab/test results, encounter notes, upcoming appointments, etc.  Non-urgent messages can be sent to your provider as well.   To learn more about what you can do with MyChart, go to [email or web address].    Your next appointment:   1 year(s)  The format for your next appointment:   In Person  Provider:   You may see Minus Breeding, MD or one of the following Advanced Practice Providers on your designated Care Team:    Rosaria Ferries, PA-C  Jory Sims, DNP, ANP  Cadence Kathlen Mody, [place]

## 2020-05-20 ENCOUNTER — Ambulatory Visit: Payer: Medicare Other | Admitting: Genetic Counselor

## 2020-05-29 ENCOUNTER — Encounter (HOSPITAL_COMMUNITY): Payer: Self-pay

## 2020-05-29 ENCOUNTER — Emergency Department (HOSPITAL_COMMUNITY)
Admission: EM | Admit: 2020-05-29 | Discharge: 2020-05-29 | Disposition: A | Discharge status: Home / Self Care | Payer: Medicare Other | Attending: Emergency Medicine | Admitting: Emergency Medicine

## 2020-05-29 ENCOUNTER — Emergency Department (HOSPITAL_COMMUNITY): Payer: Medicare Other

## 2020-05-29 ENCOUNTER — Other Ambulatory Visit: Payer: Self-pay

## 2020-05-29 DIAGNOSIS — I119 Hypertensive heart disease without heart failure: Secondary | ICD-10-CM | POA: Insufficient documentation

## 2020-05-29 DIAGNOSIS — R05 Cough: Secondary | ICD-10-CM | POA: Diagnosis not present

## 2020-05-29 DIAGNOSIS — Z85828 Personal history of other malignant neoplasm of skin: Secondary | ICD-10-CM | POA: Diagnosis not present

## 2020-05-29 DIAGNOSIS — J219 Acute bronchiolitis, unspecified: Secondary | ICD-10-CM | POA: Diagnosis not present

## 2020-05-29 DIAGNOSIS — Z7982 Long term (current) use of aspirin: Secondary | ICD-10-CM | POA: Diagnosis not present

## 2020-05-29 DIAGNOSIS — J209 Acute bronchitis, unspecified: Secondary | ICD-10-CM | POA: Diagnosis not present

## 2020-05-29 DIAGNOSIS — Z20822 Contact with and (suspected) exposure to covid-19: Secondary | ICD-10-CM | POA: Insufficient documentation

## 2020-05-29 DIAGNOSIS — Z79899 Other long term (current) drug therapy: Secondary | ICD-10-CM | POA: Diagnosis not present

## 2020-05-29 DIAGNOSIS — Z91040 Latex allergy status: Secondary | ICD-10-CM | POA: Diagnosis not present

## 2020-05-29 DIAGNOSIS — J90 Pleural effusion, not elsewhere classified: Secondary | ICD-10-CM | POA: Diagnosis not present

## 2020-05-29 LAB — SARS CORONAVIRUS 2 BY RT PCR (HOSPITAL ORDER, PERFORMED IN ~~LOC~~ HOSPITAL LAB): SARS Coronavirus 2: NEGATIVE

## 2020-05-29 NOTE — ED Provider Notes (Addendum)
[facility] EMERGENCY DEPARTMENT Provider Note   CSN: [id number] Arrival date & time: 05/29/20  0759     History Chief Complaint  Patient presents with  . Cough    [name] is a 74 y.o. female.  Patient with history of cancer currently in remission, reflux presents with sinus congestion and mild cough for the past few days.  Contact with similar.  No known Covid contacts.  Patient is vaccinated.  No fevers or chills, no shortness of breath.  No active chest pain.  Patient has been followed for high blood pressure and was taken off one of her medicines recently.        Past Medical History:  Diagnosis Date  . 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 Chronic back pain greater than 3 months duration 10/11/2017  . Congenital coronary artery anomaly   . Drug-induced leukopenia ([place]) 01/19/2016  . Enlargement of lymph nodes 07/26/2011  . 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  B-cell, favor follicular NHL.  CD79a, CD20 and CD10 positive.  Cyclin D1 negative.   . Fungal infection 08/01/2013  . GERD (gastroesophageal reflux disease)   . Hemorrhoids 08/05/2013  . Hepatic steatosis 10/20/2015  . History of B-cell lymphoma 09/25/2011   Core needle biopsy of left kidney mass--lower pole 09/30/11.  B-cell, favor follicular NHL.  CD79a, CD20 and CD10 positive.  Cyclin D1 negative.   Marland Kitchen HTN (hypertension)   . Hyperlipidemia   . Insomnia 07/25/2013  . Liver mass, right lobe 07/27/2011   1st noted by [place] 04/18/05 and by MRI 05/07/06   . 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 OVERWEIGHT 12/27/2009   Qualifier: Diagnosis of  By: Percival Spanish, MD, Farrel Gordon    . Pancytopenia due to antineoplastic chemotherapy ([place]) 09/16/2013  . Skin lesion of left leg 02/18/2014  . Skull deformity 10/08/2013  . 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 Syncope 10/14/2013  .  Thyroid disease     Patient Active Problem List   Diagnosis Date Noted  . Precordial chest pain 12/31/2019  . Educated about COVID-19 virus infection 12/31/2019  . Coronary artery disease involving native coronary artery of native heart without angina pectoris 07/19/2019  . Dyslipidemia 07/19/2019  . Leg swelling 07/19/2019  . Chronic back pain greater than 3 months duration 10/11/2017  . Quality of life palliative care encounter 01/19/2016  . Hepatic steatosis 10/20/2015  . 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 Preventative health care 06/23/2015  . Nausea without vomiting 05/12/2015  . Dysuria 05/10/2015  . 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 Skin lesion of left leg 02/18/2014  . Broken tooth 10/17/2013  . Syncope 10/14/2013  . Skull deformity 10/08/2013  . Lichen sclerosus 97/10/6376  . Pancytopenia due to antineoplastic chemotherapy ([place]) 09/16/2013  . 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 Hemorrhoids 08/05/2013  . Insomnia 07/25/2013  . 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 History of B-cell lymphoma 09/25/2011  . Renal mass, left 08/07/2011  . Liver mass, right lobe 07/27/2011  . OVERWEIGHT 12/27/2009  . CRAMP OF LIMB 11/16/2008  . HYPERLIPIDEMIA 11/15/2008  . Essential hypertension, benign 11/15/2008  . CORONARY ARTERY ANOMALY, CONGENITAL 11/15/2008  . KNEE PAIN 03/30/2008  . ANSERINE BURSITIS 03/30/2008  . FINGER SPRAIN 01/06/2008    Past Surgical History:  Procedure Laterality  Date  . ABDOMINAL HYSTERECTOMY    . BONE MARROW ASPIRATION  05/09/2013  . BONE MARROW BIOPSY  05/09/2013  . BREAST EXCISIONAL BIOPSY Bilateral    x4 1970s  . KNEE ARTHROSCOPY W/ DEBRIDEMENT  05/2011   right  . LYMPH NODE DISSECTION  x 3 different times   behind left ear 2012/ left clavical area  . MR BREAST BILATERAL  cyst removal  . TUBAL LIGATION       OB History   No obstetric history on file.     Family History  Problem  Relation Age of Onset  . Cancer Mother        breast  . Stroke Mother   . Breast cancer Mother 26  . Cancer Father        pancreatic cancer  . Heart disease Father   . Cancer Brother        kidney cancer/brain tumor  . Heart disease Brother   . Coronary artery disease Brother   . Breast cancer Paternal Aunt   . Breast cancer Paternal Aunt     Social History   Tobacco Use  . Smoking status: Never Smoker  . Smokeless tobacco: Never Used  Substance Use Topics  . Alcohol use: No  . Drug use: No    Home Medications Prior to Admission medications   Medication Sig Start Date End Date Taking? Authorizing Provider  acetaminophen (TYLENOL) 500 MG tablet Take 250 mg by mouth every 6 (six) hours as needed for moderate pain.    [provider]  amLODipine (NORVASC) 5 MG tablet Take 10 mg by mouth at bedtime. ON HOLD    [provider]  aspirin 81 MG tablet Take 81 mg by mouth at bedtime.     [provider]  atorvastatin (LIPITOR) 20 MG tablet Take 20 mg by mouth every other day. 12/18/19   [provider]  cetirizine (ZYRTEC) 10 MG tablet Take 10 mg by mouth daily.    [provider]  Cholecalciferol (VITAMIN D) 2000 UNITS tablet Take 2,000 Units by mouth daily.    [provider]  clobetasol cream (TEMOVATE) 0.45 % Apply 1 application topically 2 (two) times daily.    [provider]  Cyanocobalamin (VITAMIN B 12 PO) Take 1 tablet by mouth daily.    [provider]  ezetimibe (ZETIA) 10 MG tablet Take 10 mg by mouth daily.    [provider]  losartan (COZAAR) 25 MG tablet Take 25 mg by mouth daily.     [provider]  Multiple Vitamins-Minerals (ONE-A-DAY WOMENS 50 PLUS PO) Take 1 tablet by mouth daily.    [provider]  Omega-3 Fatty Acids (FISH OIL) 1000 MG CAPS Take by mouth 2 (two) times daily.    [provider]  pantoprazole (PROTONIX) 20 MG tablet Take 20 mg by mouth as  needed.    [provider]  polyethylene glycol (MIRALAX / GLYCOLAX) packet Take 17 g by mouth daily.    [provider]  Probiotic Product (PROBIOTIC DAILY PO) Take 1 capsule by mouth daily.    [provider]  spironolactone (ALDACTONE) 25 MG tablet Take 1 tablet by mouth daily.    [provider]    Allergies    Latex  Review of Systems   Review of Systems  Constitutional: Negative for chills and fever.  HENT: Positive for congestion.   Eyes: Negative for visual disturbance.  Respiratory: Positive for cough. Negative for shortness of  breath.   Cardiovascular: Negative for chest pain.  Gastrointestinal: Negative for abdominal pain and vomiting.  Genitourinary: Negative for dysuria and flank pain.  Musculoskeletal: Negative for back pain, neck pain and neck stiffness.  Skin: Negative for rash.  Neurological: Negative for light-headedness and headaches.    Physical Exam Updated Vital Signs BP (!) 148/87 (BP Location: Right Arm)   Pulse 66   Temp 98.2 F (36.8 C) (Oral)   Resp 18   Ht _0  (1.575 m)   Wt 78.5 kg   SpO2 100%   BMI 31.64 kg/m   Physical Exam Vitals and nursing note reviewed.  Constitutional:      Appearance: She is well-developed.  HENT:     Head: Normocephalic and atraumatic.     Nose: Congestion present.  Eyes:     General:        Right eye: No discharge.        Left eye: No discharge.     Conjunctiva/sclera: Conjunctivae normal.  Neck:     Trachea: No tracheal deviation.  Cardiovascular:     Rate and Rhythm: Normal rate and regular rhythm.  Pulmonary:     Effort: Pulmonary effort is normal.     Breath sounds: Normal breath sounds.  Abdominal:     General: There is no distension.     Palpations: Abdomen is soft.     Tenderness: There is no abdominal tenderness. There is no guarding.  Musculoskeletal:     Cervical back: Normal range of motion and neck supple.  Skin:    General: Skin is warm.      Findings: No rash.  Neurological:     Mental Status: She is alert and oriented to person, place, and time.     ED Results / Procedures / Treatments   Labs (all labs ordered are listed, but only abnormal results are displayed) Labs Reviewed  SARS CORONAVIRUS 2 BY RT PCR (HOSPITAL ORDER, [place] [facility])    EKG None  Radiology No results found.  Procedures Procedures (including critical care time)  Medications Ordered in ED Medications - No data to display  ED Course  I have reviewed the triage vital signs and the nursing notes.  Pertinent labs & imaging results that were available during my care of the patient were reviewed by me and considered in my medical decision making (see chart for details).    MDM Rules/Calculators/A&P                          Well-appearing patient presents with clinical concern for bronchitis/upper respiratory infection/Covid/other.  Lungs are clear, normal work of breathing, normal oxygenation.  Covid test negative.  Chest x-ray reviewed no acute abnormalities no infiltrate.  Supportive care discussed injury and reasons to return  [name] was evaluated in Emergency Department on 05/29/2020 for the symptoms described in the history of present illness. She was evaluated in the context of the global COVID-19 pandemic, which necessitated consideration that the patient might be at risk for infection with the SARS-CoV-2 virus that causes COVID-19. Institutional protocols and algorithms that pertain to the evaluation of patients at risk for COVID-19 are in a state of rapid change based on information released by regulatory bodies including the CDC and federal and state organizations. These policies and algorithms were followed during the patient's care in the ED.  Final Clinical Impression(s) / ED Diagnoses Final diagnoses:  Acute bronchitis, unspecified organism  Rx / DC Orders ED Discharge Orders    None       Elnora Morrison, MD 05/29/20 1032    Elnora Morrison, MD 05/29/20 [phone]

## 2020-05-29 NOTE — ED Triage Notes (Signed)
Pt reports head congestion that started Tuesday and has progressed to her chest. Pt has non productive cough. Some SOB with exertion. Cough worse last night.  Pt has had both of her covid vaccinations

## 2020-05-29 NOTE — Discharge Instructions (Addendum)
Return for shortness of breath, persistent fevers or new concerns. Follow-up with your primary doctor for your blood pressure and reassessment. Your covid test is negative at this time. Continue to wear your mask around other people.

## 2020-06-03 NOTE — Progress Notes (Signed)
Pre-test Genetic Consult notes   Referring Provider: Kirk Ruths, MD   Referral Reason [name] was referred for genetic consult and testing of familial hypercholesterolemia (FH).  [name] (III.2 on pedigree) is a pleasant 74 year old African American lady who reports having chest pains in 2020 that was later found to be due to acid reflux. She reports having dizziness especially when she has acid reflux. It was recommended that she see a cardiologist for further screening tests. She states that her echocardiogram was normal and her lipid panel detected LDL-C of 159.   Risk Factors [name] denies having hypothyroidism, renal or hepatic dysfunction or diabetes that can also lead to elevated LDL-C   Family history [name] (III.2) has two sons, ages 62 (IV.5) and 74 (IV.6). She believes they are in good health. Her older brother (III.1) died of brain cancer at age 62. A younger brother (III.5) had several heart attacks, underwent CABG and had several mini-strokes right before the [place] pandemic. She reports that he was hospitalized and upon discharge had a sudden cardiac arrest in the car. He was taken back to the hospital, placed on a ventilator and died a week later. She has another brother (III.6) with rheumatic fever and heart issues. Her youngest sister (III.7) has sarcoidosis and her other sister (III.3) is tight-lipped about her health. There are no reports of heart disease amongst her nephews and nieces (IV.1-IV.4, IV.7-IV.11).   [name]'s father (II.10) died of pancreatic cancer at age 66. He did have heart disease, but [name] does not have the details. Most of his siblings died of cancer (II.2-II.8), except for a sister (II.1) that passed away from a heart attack. Her paternal grandmother (I.2) died of stroke at 77 and grandfather (I.1) died of old age.  [name]'s mother (II.11) died of brain aneurysm at 54. Her siblings died of kidney disease (II.12) and colon  cancer (II.13-II.14). Both maternal grandparents (I.3-I.4) died of old age.  Genetic Consultation Notes  We walked through the risk factors that can lead to hypercholesterolemia. I explained to [name] that the characteristic features of a genetic condition include absence of risk factors that can lead to elevated LDL-C, early age of presentation, increased disease severity and family history of the condition. The clinical manifestations of FH were also reviewed. She denies having xanthomas, corneal arcus or a heart attack.  I discussed the molecular pathogenesis of FH. I informed her that [place] is primarily caused by pathogenic variants in three genes, namely APOB, LDLR and PCSK9. These pathogenic variants impact LDLR synthesis, degradation and recycling in cells and results in elevated LDL-C levels. We then walked through autosomal dominant inheritance pattern and viewed pedigree of families with heterozygous FH (HeFH) and homozygous FH (HoFH). Explained to her that digenic or compound heterozygous mutations in APOB, LDLR and PCSK9 genes can cause HoFH.   We reviewed the likely outcomes of FH genetic testing. Based on the diagnostic criteria for FH, yields can range from 50%-90%. A positive yield is observed in  ~63% of patients with a definite clinical diagnosis of FH. A negative test does not exclude a genetic basis for FH. In some cases, polygenic inheritance, where more than an average number of common variants, each with small effect, are known to increase plasma lipid levels. Additionally, limitations in current genetic testing methodology can produce a negative result. Variants of unknown significance (VUS) can be seen in some cases. I explained that typically a VUS is so classified if the variant is  not well understood as very few individuals have been reported to harbor this variant or its role in gene function has not been elucidated. Screening other first-degree family members by genetic testing was  also discussed. Additionally, we briefly touched upon the molecular basis of the different treatment modalities that are currently available.  Impression and Plan  In summary, [name] has elevated LDL-C of 159 and no history of heart attacks in the absence of typical risk factors for hypercholesterolemia. While genetic testing for familial hypercholesterolemia may have some clinical utility, there is a high likelihood that she may have a negative result. [name] is reluctant to proceed with FH genetic testing. It is recommended that she get regular lipid screening and pursue appropriate treatment and lifestyle changes to reduce her LDL-C. She is agreeable with this.  Please note that the patient has not been counseled in this visit on personal, cultural or ethical issues that she may face due to her heart condition.    Lattie Corns, Ph.D, [facility] Clinical Molecular Geneticist

## 2020-06-08 DIAGNOSIS — M79675 Pain in left toe(s): Secondary | ICD-10-CM | POA: Diagnosis not present

## 2020-06-08 DIAGNOSIS — M79674 Pain in right toe(s): Secondary | ICD-10-CM | POA: Diagnosis not present

## 2020-06-08 DIAGNOSIS — B351 Tinea unguium: Secondary | ICD-10-CM | POA: Diagnosis not present

## 2020-06-09 ENCOUNTER — Other Ambulatory Visit: Payer: Self-pay | Admitting: Family Medicine

## 2020-06-09 DIAGNOSIS — I10 Essential (primary) hypertension: Secondary | ICD-10-CM | POA: Diagnosis not present

## 2020-06-09 DIAGNOSIS — Z1231 Encounter for screening mammogram for malignant neoplasm of breast: Secondary | ICD-10-CM

## 2020-06-10 DIAGNOSIS — E785 Hyperlipidemia, unspecified: Secondary | ICD-10-CM | POA: Diagnosis not present

## 2020-06-10 DIAGNOSIS — I10 Essential (primary) hypertension: Secondary | ICD-10-CM | POA: Diagnosis not present

## 2020-06-10 DIAGNOSIS — K219 Gastro-esophageal reflux disease without esophagitis: Secondary | ICD-10-CM | POA: Diagnosis not present

## 2020-06-14 ENCOUNTER — Other Ambulatory Visit (HOSPITAL_COMMUNITY): Payer: Self-pay | Admitting: Family Medicine

## 2020-06-14 ENCOUNTER — Other Ambulatory Visit: Payer: Self-pay

## 2020-06-14 ENCOUNTER — Ambulatory Visit (HOSPITAL_COMMUNITY)
Admission: RE | Admit: 2020-06-14 | Discharge: 2020-06-14 | Disposition: A | Discharge status: Home / Self Care | Payer: Medicare Other | Source: Ambulatory Visit | Attending: Family Medicine | Admitting: Family Medicine

## 2020-06-14 DIAGNOSIS — R2242 Localized swelling, mass and lump, left lower limb: Secondary | ICD-10-CM | POA: Diagnosis not present

## 2020-06-14 DIAGNOSIS — M7122 Synovial cyst of popliteal space [Baker], left knee: Secondary | ICD-10-CM | POA: Diagnosis not present

## 2020-06-22 DIAGNOSIS — M79662 Pain in left lower leg: Secondary | ICD-10-CM | POA: Diagnosis not present

## 2020-06-22 DIAGNOSIS — M25552 Pain in left hip: Secondary | ICD-10-CM | POA: Diagnosis not present

## 2020-06-22 DIAGNOSIS — C8590 Non-Hodgkin lymphoma, unspecified, unspecified site: Secondary | ICD-10-CM | POA: Diagnosis not present

## 2020-06-22 DIAGNOSIS — I10 Essential (primary) hypertension: Secondary | ICD-10-CM | POA: Diagnosis not present

## 2020-06-22 DIAGNOSIS — M159 Polyosteoarthritis, unspecified: Secondary | ICD-10-CM | POA: Diagnosis not present

## 2020-06-22 DIAGNOSIS — F411 Generalized anxiety disorder: Secondary | ICD-10-CM | POA: Diagnosis not present

## 2020-06-26 DIAGNOSIS — Z23 Encounter for immunization: Secondary | ICD-10-CM | POA: Diagnosis not present

## 2020-07-01 ENCOUNTER — Ambulatory Visit: Payer: Medicare Other

## 2020-07-07 DIAGNOSIS — M170 Bilateral primary osteoarthritis of knee: Secondary | ICD-10-CM | POA: Diagnosis not present

## 2020-07-07 DIAGNOSIS — M25472 Effusion, left ankle: Secondary | ICD-10-CM | POA: Diagnosis not present

## 2020-07-10 DIAGNOSIS — K219 Gastro-esophageal reflux disease without esophagitis: Secondary | ICD-10-CM | POA: Diagnosis not present

## 2020-07-10 DIAGNOSIS — E785 Hyperlipidemia, unspecified: Secondary | ICD-10-CM | POA: Diagnosis not present

## 2020-07-10 DIAGNOSIS — I10 Essential (primary) hypertension: Secondary | ICD-10-CM | POA: Diagnosis not present

## 2020-07-14 DIAGNOSIS — M1711 Unilateral primary osteoarthritis, right knee: Secondary | ICD-10-CM | POA: Diagnosis not present

## 2020-07-14 DIAGNOSIS — M1712 Unilateral primary osteoarthritis, left knee: Secondary | ICD-10-CM | POA: Diagnosis not present

## 2020-07-14 DIAGNOSIS — M170 Bilateral primary osteoarthritis of knee: Secondary | ICD-10-CM | POA: Diagnosis not present

## 2020-07-20 DIAGNOSIS — M1711 Unilateral primary osteoarthritis, right knee: Secondary | ICD-10-CM | POA: Diagnosis not present

## 2020-07-26 DIAGNOSIS — I10 Essential (primary) hypertension: Secondary | ICD-10-CM | POA: Diagnosis not present

## 2020-07-26 DIAGNOSIS — R600 Localized edema: Secondary | ICD-10-CM | POA: Diagnosis not present

## 2020-07-26 DIAGNOSIS — Z6831 Body mass index (BMI) 31.0-31.9, adult: Secondary | ICD-10-CM | POA: Diagnosis not present

## 2020-07-26 DIAGNOSIS — E785 Hyperlipidemia, unspecified: Secondary | ICD-10-CM | POA: Diagnosis not present

## 2020-07-26 DIAGNOSIS — Z7189 Other specified counseling: Secondary | ICD-10-CM | POA: Diagnosis not present

## 2020-07-26 DIAGNOSIS — C8239 Follicular lymphoma grade IIIa, extranodal and solid organ sites: Secondary | ICD-10-CM | POA: Diagnosis not present

## 2020-07-27 DIAGNOSIS — M1711 Unilateral primary osteoarthritis, right knee: Secondary | ICD-10-CM | POA: Diagnosis not present

## 2020-07-28 DIAGNOSIS — L439 Lichen planus, unspecified: Secondary | ICD-10-CM | POA: Diagnosis not present

## 2020-07-28 DIAGNOSIS — I872 Venous insufficiency (chronic) (peripheral): Secondary | ICD-10-CM | POA: Diagnosis not present

## 2020-07-28 DIAGNOSIS — R600 Localized edema: Secondary | ICD-10-CM | POA: Diagnosis not present

## 2020-07-28 DIAGNOSIS — Z79899 Other long term (current) drug therapy: Secondary | ICD-10-CM | POA: Diagnosis not present

## 2020-07-28 DIAGNOSIS — L810 Postinflammatory hyperpigmentation: Secondary | ICD-10-CM | POA: Diagnosis not present

## 2020-08-09 ENCOUNTER — Other Ambulatory Visit: Payer: Self-pay

## 2020-08-09 ENCOUNTER — Ambulatory Visit
Admission: RE | Admit: 2020-08-09 | Discharge: 2020-08-09 | Disposition: A | Discharge status: Home / Self Care | Payer: Medicare Other | Source: Ambulatory Visit | Attending: Family Medicine | Admitting: Family Medicine

## 2020-08-09 DIAGNOSIS — Z1231 Encounter for screening mammogram for malignant neoplasm of breast: Secondary | ICD-10-CM | POA: Diagnosis not present

## 2020-08-10 DIAGNOSIS — K219 Gastro-esophageal reflux disease without esophagitis: Secondary | ICD-10-CM | POA: Diagnosis not present

## 2020-08-10 DIAGNOSIS — I10 Essential (primary) hypertension: Secondary | ICD-10-CM | POA: Diagnosis not present

## 2020-08-10 DIAGNOSIS — E785 Hyperlipidemia, unspecified: Secondary | ICD-10-CM | POA: Diagnosis not present

## 2020-08-23 DIAGNOSIS — R609 Edema, unspecified: Secondary | ICD-10-CM | POA: Diagnosis not present

## 2020-08-23 DIAGNOSIS — M25572 Pain in left ankle and joints of left foot: Secondary | ICD-10-CM | POA: Diagnosis not present

## 2020-08-24 DIAGNOSIS — B351 Tinea unguium: Secondary | ICD-10-CM | POA: Diagnosis not present

## 2020-08-24 DIAGNOSIS — M79674 Pain in right toe(s): Secondary | ICD-10-CM | POA: Diagnosis not present

## 2020-08-24 DIAGNOSIS — M79675 Pain in left toe(s): Secondary | ICD-10-CM | POA: Diagnosis not present

## 2020-09-10 DIAGNOSIS — K219 Gastro-esophageal reflux disease without esophagitis: Secondary | ICD-10-CM | POA: Diagnosis not present

## 2020-09-10 DIAGNOSIS — I10 Essential (primary) hypertension: Secondary | ICD-10-CM | POA: Diagnosis not present

## 2020-09-10 DIAGNOSIS — E785 Hyperlipidemia, unspecified: Secondary | ICD-10-CM | POA: Diagnosis not present

## 2020-09-15 DIAGNOSIS — L0231 Cutaneous abscess of buttock: Secondary | ICD-10-CM | POA: Diagnosis not present

## 2020-09-15 DIAGNOSIS — I509 Heart failure, unspecified: Secondary | ICD-10-CM | POA: Diagnosis not present

## 2020-09-15 DIAGNOSIS — Z7189 Other specified counseling: Secondary | ICD-10-CM | POA: Diagnosis not present

## 2020-09-15 DIAGNOSIS — R600 Localized edema: Secondary | ICD-10-CM | POA: Diagnosis not present

## 2020-09-15 DIAGNOSIS — I10 Essential (primary) hypertension: Secondary | ICD-10-CM | POA: Diagnosis not present

## 2020-10-11 DIAGNOSIS — I10 Essential (primary) hypertension: Secondary | ICD-10-CM | POA: Diagnosis not present

## 2020-10-11 DIAGNOSIS — K219 Gastro-esophageal reflux disease without esophagitis: Secondary | ICD-10-CM | POA: Diagnosis not present

## 2020-10-11 DIAGNOSIS — E785 Hyperlipidemia, unspecified: Secondary | ICD-10-CM | POA: Diagnosis not present

## 2020-11-02 DIAGNOSIS — M79675 Pain in left toe(s): Secondary | ICD-10-CM | POA: Diagnosis not present

## 2020-11-02 DIAGNOSIS — M79674 Pain in right toe(s): Secondary | ICD-10-CM | POA: Diagnosis not present

## 2020-11-02 DIAGNOSIS — B351 Tinea unguium: Secondary | ICD-10-CM | POA: Diagnosis not present

## 2020-11-08 DIAGNOSIS — I10 Essential (primary) hypertension: Secondary | ICD-10-CM | POA: Diagnosis not present

## 2020-11-08 DIAGNOSIS — E785 Hyperlipidemia, unspecified: Secondary | ICD-10-CM | POA: Diagnosis not present

## 2020-11-08 DIAGNOSIS — K219 Gastro-esophageal reflux disease without esophagitis: Secondary | ICD-10-CM | POA: Diagnosis not present

## 2020-12-09 DIAGNOSIS — E785 Hyperlipidemia, unspecified: Secondary | ICD-10-CM | POA: Diagnosis not present

## 2020-12-09 DIAGNOSIS — I10 Essential (primary) hypertension: Secondary | ICD-10-CM | POA: Diagnosis not present

## 2020-12-09 DIAGNOSIS — K219 Gastro-esophageal reflux disease without esophagitis: Secondary | ICD-10-CM | POA: Diagnosis not present

## 2020-12-12 NOTE — Progress Notes (Signed)
Cardiology Office Note   Date:  12/13/2020   ID:  [name], [name] [birth year], MRN [id number]  PCP:  [name], MD  Cardiologist:   Minus Breeding, MD   No chief complaint on file.     History of Present Illness: [name] is a 75 y.o. female who presents for follow up of a coronary anomaly.  I last saw her in 2016.  She returns for follow-up. She was in the ED in October with chest pain.    There was no evidence of ischemia.  The pain was felt to be GI.     She had a CTA with mild calcium and no obstructive CAD.  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  She has had some ankle edema treated with compression stockings.  Somebody told her she must have heart failure.  However, she is not having any shortness of breath, PND or orthopnea.  Her swelling goes away at night.  She can lie flat back without difficulty.  She can climb the stairs to her basement which she does frequently and does not get any shortness of breath or chest pain.  He had an echocardiogram with well-preserved ejection fraction without evidence of significant diastolic dysfunction in 2694.  She is not having any chest pressure, neck or arm discomfort.  Past Medical History:  Diagnosis Date  . 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 Chronic back pain greater than 3 months duration 10/11/2017  . Congenital coronary artery anomaly   . Drug-induced leukopenia ([place]) 01/19/2016  . Enlargement of lymph nodes 07/26/2011  . 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  B-cell, favor follicular NHL.  CD79a, CD20 and CD10 positive.  Cyclin D1 negative.   . Fungal infection 08/01/2013  . GERD (gastroesophageal reflux disease)   . Hemorrhoids 08/05/2013  . Hepatic steatosis 10/20/2015  . History  of B-cell lymphoma 09/25/2011   Core needle biopsy of left kidney mass--lower pole 09/30/11.  B-cell, favor follicular NHL.  CD79a, CD20 and CD10 positive.  Cyclin D1 negative.   Marland Kitchen HTN (hypertension)   . Hyperlipidemia   . Insomnia 07/25/2013  . Liver mass, right lobe 07/27/2011   1st noted by [place] 04/18/05 and by MRI 05/07/06   . 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 OVERWEIGHT 12/27/2009   Qualifier: Diagnosis of  By: Percival Spanish, MD, Farrel Gordon    . Pancytopenia due to antineoplastic chemotherapy ([place]) 09/16/2013  . Skin lesion of left leg 02/18/2014  . Skull deformity 10/08/2013  . 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 Syncope 10/14/2013  . Thyroid disease     Past Surgical History:  Procedure Laterality Date  . ABDOMINAL HYSTERECTOMY    . BONE MARROW ASPIRATION  05/09/2013  . BONE MARROW BIOPSY  05/09/2013  . BREAST EXCISIONAL BIOPSY Bilateral    x4 1970s  . KNEE ARTHROSCOPY W/ DEBRIDEMENT  05/2011   right  . LYMPH NODE DISSECTION  x 3 different times   behind left ear 2012/ left clavical area  . MR BREAST BILATERAL  cyst removal  . TUBAL LIGATION       Current Outpatient Medications  Medication Sig Dispense Refill  . acetaminophen (TYLENOL) 500 MG tablet Take 250 mg  by mouth every 6 (six) hours as needed for moderate pain.    Marland Kitchen aspirin 81 MG tablet Take 81 mg by mouth at bedtime.    Marland Kitchen atorvastatin (LIPITOR) 20 MG tablet Take 20 mg by mouth every other day.    . cetirizine (ZYRTEC) 10 MG tablet Take 10 mg by mouth daily.    . Cholecalciferol (VITAMIN D) 2000 UNITS tablet Take 2,000 Units by mouth daily.    . clobetasol cream (TEMOVATE) 3.38 % Apply 1 application topically 2 (two) times daily.    . Cyanocobalamin (VITAMIN B 12 PO) Take 1 tablet by mouth daily.    Marland Kitchen ezetimibe (ZETIA) 10 MG tablet Take 10 mg by mouth daily.    Marland Kitchen losartan (COZAAR) 25 MG tablet Take 25 mg by mouth daily.     . Multiple Vitamins-Minerals (ONE-A-DAY WOMENS 50 PLUS  PO) Take 1 tablet by mouth daily.    . Omega-3 Fatty Acids (FISH OIL) 1000 MG CAPS Take by mouth 2 (two) times daily.    . pantoprazole (PROTONIX) 20 MG tablet Take 20 mg by mouth as needed.    . polyethylene glycol (MIRALAX / GLYCOLAX) packet Take 17 g by mouth daily.    . Probiotic Product (PROBIOTIC DAILY PO) Take 1 capsule by mouth daily.     No current facility-administered medications for this visit.    Allergies:   Latex    ROS:  Please see the history of present illness.   Otherwise, review of systems are positive for none.   All other systems are reviewed and negative.    PHYSICAL EXAM: VS:  BP 120/70   Pulse 73   Ht _0  (1.575 m)   Wt 175 lb 9.6 oz (79.7 kg)   BMI 32.12 kg/m  , BMI Body mass index is 32.12 kg/m. GENERAL:  Well appearing NECK:  No jugular venous distention, waveform within normal limits, carotid upstroke brisk and symmetric, no bruits, no thyromegaly LUNGS:  Clear to auscultation bilaterally CHEST:  Unremarkable HEART:  PMI not displaced or sustained,S1 and S2 within normal limits, no S3, no S4, no clicks, no rubs, no murmurs ABD:  Flat, positive bowel sounds normal in frequency in pitch, no bruits, no rebound, no guarding, no midline pulsatile mass, no hepatomegaly, no splenomegaly EXT:  2 plus pulses throughout, no edema, no cyanosis no clubbing   EKG:  EKG is  ordered today. The ekg ordered today demonstrates sinus rhythm, rate 73, axis within normal limits, intervals within normal limits, no acute ST-T wave changes.   Recent Labs: 01/27/2020: BUN 19; Creatinine, Ser 1.05; Potassium 4.6; Sodium 140    Lipid Panel    Component Value Date/Time   CHOL 238 (H) 10/29/2019 0941   CHOL 228 (H) 07/22/2019 1449   TRIG 81 10/29/2019 0941   HDL 61 10/29/2019 0941   HDL 60 07/22/2019 1449   CHOLHDL 3.9 10/29/2019 0941   LDLCALC 159 (H) 10/29/2019 0941      Wt Readings from Last 3 Encounters:  12/13/20 175 lb 9.6 oz (79.7 kg)  05/29/20 173 lb  (78.5 kg)  05/19/20 182 lb (82.6 kg)      Other studies Reviewed: Additional studies/ records that were reviewed today include: Labs Review of the above records demonstrates:  Please see elsewhere in the note.     ASSESSMENT AND PLAN:  CORONARY ARTERY ANOMALY, CONGENITAL She has congenital abnormality but this does not seem to be high risk despite the abnormal takeoff of the left main.  Surgery certainly not indicated given her age and the fact she has had no problems with this.   ESSENTIAL HYPERTENSION, BENIGN The blood pressure is at target.  No change in therapy.   HYPERLIPIDEMIA Her LDL was excellent.  She did have coronary calcium so now is taking a statin and her LDL of 69.  No change in therapy. Marland Kitchen   EDEMA:     This seems to be mild and I do not think this represents heart failure given the absence of confirming symptoms or suggestion of abnormalities on previous echo.  She will manage this conservatively.   Current medicines are reviewed at length with the patient today.  The patient does not have concerns regarding medicines.  The following changes have been made: None  Labs/ tests ordered today include: None  Orders Placed This Encounter  Procedures  . EKG 12-Lead     Disposition:   FU with me in 12 months   Signed, Minus Breeding, MD  12/13/2020 1:09 PM    [facility]

## 2020-12-13 ENCOUNTER — Ambulatory Visit (INDEPENDENT_AMBULATORY_CARE_PROVIDER_SITE_OTHER): Payer: Medicare Other | Admitting: Cardiology

## 2020-12-13 ENCOUNTER — Other Ambulatory Visit: Payer: Self-pay

## 2020-12-13 ENCOUNTER — Encounter: Payer: Self-pay | Admitting: Cardiology

## 2020-12-13 VITALS — BP 120/70 | HR 73 | Ht 62.0 in | Wt 175.6 lb

## 2020-12-13 DIAGNOSIS — I10 Essential (primary) hypertension: Secondary | ICD-10-CM

## 2020-12-13 DIAGNOSIS — Q245 Malformation of coronary vessels: Secondary | ICD-10-CM

## 2020-12-13 DIAGNOSIS — R072 Precordial pain: Secondary | ICD-10-CM | POA: Diagnosis not present

## 2020-12-13 DIAGNOSIS — E785 Hyperlipidemia, unspecified: Secondary | ICD-10-CM | POA: Diagnosis not present

## 2020-12-13 NOTE — Patient Instructions (Signed)
Medication Instructions:  Continue current medications  *If you need a refill on your cardiac medications before your next appointment, please call your pharmacy*   Lab Work: None Ordered   Testing/Procedures: None ordered   Follow-Up: At [facility], you and your health needs are our priority.  As part of our continuing mission to provide you with exceptional heart care, we have created designated Provider Care Teams.  These Care Teams include your primary Cardiologist (physician) and Advanced Practice Providers (APPs -  Physician Assistants and Nurse Practitioners) who all work together to provide you with the care you need, when you need it.  We recommend signing up for the patient portal called "MyChart".  Sign up information is provided on this After Visit Summary.  MyChart is used to connect with patients for Virtual Visits (Telemedicine).  Patients are able to view lab/test results, encounter notes, upcoming appointments, etc.  Non-urgent messages can be sent to your provider as well.   To learn more about what you can do with MyChart, go to [email or web address].    Your next appointment:   1 year(s)  The format for your next appointment:   In Person  Provider:   Dr Minus Breeding

## 2020-12-20 DIAGNOSIS — H43812 Vitreous degeneration, left eye: Secondary | ICD-10-CM | POA: Diagnosis not present

## 2020-12-20 DIAGNOSIS — H2511 Age-related nuclear cataract, right eye: Secondary | ICD-10-CM | POA: Diagnosis not present

## 2020-12-20 DIAGNOSIS — Z961 Presence of intraocular lens: Secondary | ICD-10-CM | POA: Diagnosis not present

## 2020-12-20 DIAGNOSIS — H04123 Dry eye syndrome of bilateral lacrimal glands: Secondary | ICD-10-CM | POA: Diagnosis not present

## 2020-12-21 DIAGNOSIS — C8239 Follicular lymphoma grade IIIa, extranodal and solid organ sites: Secondary | ICD-10-CM | POA: Diagnosis not present

## 2020-12-21 DIAGNOSIS — I10 Essential (primary) hypertension: Secondary | ICD-10-CM | POA: Diagnosis not present

## 2020-12-21 DIAGNOSIS — E785 Hyperlipidemia, unspecified: Secondary | ICD-10-CM | POA: Diagnosis not present

## 2020-12-21 DIAGNOSIS — Z0000 Encounter for general adult medical examination without abnormal findings: Secondary | ICD-10-CM | POA: Diagnosis not present

## 2020-12-30 DIAGNOSIS — Z23 Encounter for immunization: Secondary | ICD-10-CM | POA: Diagnosis not present

## 2021-01-08 DIAGNOSIS — K219 Gastro-esophageal reflux disease without esophagitis: Secondary | ICD-10-CM | POA: Diagnosis not present

## 2021-01-08 DIAGNOSIS — E785 Hyperlipidemia, unspecified: Secondary | ICD-10-CM | POA: Diagnosis not present

## 2021-01-08 DIAGNOSIS — I10 Essential (primary) hypertension: Secondary | ICD-10-CM | POA: Diagnosis not present

## 2021-01-11 DIAGNOSIS — B351 Tinea unguium: Secondary | ICD-10-CM | POA: Diagnosis not present

## 2021-01-11 DIAGNOSIS — M79674 Pain in right toe(s): Secondary | ICD-10-CM | POA: Diagnosis not present

## 2021-01-11 DIAGNOSIS — M79675 Pain in left toe(s): Secondary | ICD-10-CM | POA: Diagnosis not present

## 2021-02-10 ENCOUNTER — Other Ambulatory Visit: Payer: Self-pay

## 2021-02-10 ENCOUNTER — Ambulatory Visit (HOSPITAL_COMMUNITY)
Admission: RE | Admit: 2021-02-10 | Discharge: 2021-02-10 | Disposition: A | Discharge status: Home / Self Care | Payer: Medicare Other | Source: Ambulatory Visit | Attending: Orthopedic Surgery | Admitting: Orthopedic Surgery

## 2021-02-10 ENCOUNTER — Encounter: Payer: Self-pay | Admitting: Emergency Medicine

## 2021-02-10 ENCOUNTER — Ambulatory Visit
Admission: EM | Admit: 2021-02-10 | Discharge: 2021-02-10 | Disposition: A | Discharge status: Home / Self Care | Payer: Medicare Other

## 2021-02-10 ENCOUNTER — Other Ambulatory Visit (HOSPITAL_COMMUNITY): Payer: Self-pay | Admitting: Medical

## 2021-02-10 DIAGNOSIS — M7989 Other specified soft tissue disorders: Secondary | ICD-10-CM | POA: Insufficient documentation

## 2021-02-10 DIAGNOSIS — M79661 Pain in right lower leg: Secondary | ICD-10-CM | POA: Diagnosis not present

## 2021-02-10 DIAGNOSIS — M79605 Pain in left leg: Secondary | ICD-10-CM | POA: Insufficient documentation

## 2021-02-10 DIAGNOSIS — M7122 Synovial cyst of popliteal space [Baker], left knee: Secondary | ICD-10-CM

## 2021-02-10 DIAGNOSIS — M79662 Pain in left lower leg: Secondary | ICD-10-CM | POA: Diagnosis not present

## 2021-02-10 DIAGNOSIS — M25562 Pain in left knee: Secondary | ICD-10-CM | POA: Diagnosis not present

## 2021-02-10 DIAGNOSIS — M79604 Pain in right leg: Secondary | ICD-10-CM | POA: Diagnosis not present

## 2021-02-10 NOTE — ED Provider Notes (Signed)
[facility]    CSN: [id number] Arrival date & time: 02/10/21  0233      History   Chief Complaint No chief complaint on file.   HPI [name] is a 75 y.o. female who presents with pain of L lower extremity from Medial  knee down to her mid shin, but denies injuring herself. Has hx of OA of both knees and Baker's cyst of L. Has not called to get into her orthopedist. Has been wearing support hoses to prevent swelling and they are knee high. Pain is provoked with walking and flexing it. Rest helps a little. Has not noticed swelling. Denies calf pain.     Past Medical History:  Diagnosis Date  . 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 Chronic back pain greater than 3 months duration 10/11/2017  . Congenital coronary artery anomaly   . Drug-induced leukopenia ([place]) 01/19/2016  . Enlargement of lymph nodes 07/26/2011  . 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  B-cell, favor follicular NHL.  CD79a, CD20 and CD10 positive.  Cyclin D1 negative.   . Fungal infection 08/01/2013  . GERD (gastroesophageal reflux disease)   . Hemorrhoids 08/05/2013  . Hepatic steatosis 10/20/2015  . History of B-cell lymphoma 09/25/2011   Core needle biopsy of left kidney mass--lower pole 09/30/11.  B-cell, favor follicular NHL.  CD79a, CD20 and CD10 positive.  Cyclin D1 negative.   Marland Kitchen HTN (hypertension)   . Hyperlipidemia   . Insomnia 07/25/2013  . Liver mass, right lobe 07/27/2011   1st noted by [place] 04/18/05 and by MRI 05/07/06   . 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 OVERWEIGHT 12/27/2009   Qualifier: Diagnosis of  By: Percival Spanish, MD, Farrel Gordon    . Pancytopenia due to antineoplastic chemotherapy ([place]) 09/16/2013  . Skin lesion of left leg 02/18/2014  . Skull deformity 10/08/2013  . 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 Syncope 10/14/2013  . Thyroid  disease     Patient Active Problem List   Diagnosis Date Noted  . Precordial chest pain 12/31/2019  . Educated about COVID-19 virus infection 12/31/2019  . Coronary artery disease involving native coronary artery of native heart without angina pectoris 07/19/2019  . Dyslipidemia 07/19/2019  . Leg swelling 07/19/2019  . Chronic back pain greater than 3 months duration 10/11/2017  . Quality of life palliative care encounter 01/19/2016  . Hepatic steatosis 10/20/2015  . 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 Preventative health care 06/23/2015  . Nausea without vomiting 05/12/2015  . Dysuria 05/10/2015  . 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 Skin lesion of left leg 02/18/2014  . Broken tooth 10/17/2013  . Syncope 10/14/2013  . Skull deformity 10/08/2013  . Lichen sclerosus 43/56/8616  . Pancytopenia due to antineoplastic chemotherapy ([place]) 09/16/2013  . 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 Hemorrhoids 08/05/2013  . Insomnia 07/25/2013  . 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 History of B-cell lymphoma 09/25/2011  . Renal mass, left 08/07/2011  . Liver mass, right lobe 07/27/2011  . OVERWEIGHT 12/27/2009  . CRAMP OF LIMB 11/16/2008  . HYPERLIPIDEMIA 11/15/2008  . Essential hypertension, benign 11/15/2008  . CORONARY ARTERY ANOMALY, CONGENITAL 11/15/2008  . KNEE PAIN 03/30/2008  . ANSERINE BURSITIS 03/30/2008  . FINGER SPRAIN 01/06/2008    Past Surgical History:  Procedure Laterality Date  . ABDOMINAL HYSTERECTOMY    . BONE MARROW ASPIRATION  05/09/2013  . BONE MARROW BIOPSY  05/09/2013  . BREAST EXCISIONAL BIOPSY Bilateral    x4 1970s  . KNEE ARTHROSCOPY W/ DEBRIDEMENT  05/2011   right  . LYMPH NODE DISSECTION  x 3 different times   behind left ear 2012/ left clavical area  . MR BREAST BILATERAL  cyst removal  . TUBAL LIGATION      OB History   No obstetric history on file.      Home Medications    Prior to  Admission medications   Medication Sig Start Date End Date Taking? Authorizing Provider  acetaminophen (TYLENOL) 500 MG tablet Take 250 mg by mouth every 6 (six) hours as needed for moderate pain.    [provider]  aspirin 81 MG tablet Take 81 mg by mouth at bedtime.    [provider]  atorvastatin (LIPITOR) 20 MG tablet Take 20 mg by mouth every other day. 12/18/19   [provider]  cetirizine (ZYRTEC) 10 MG tablet Take 10 mg by mouth daily.    [provider]  Cholecalciferol (VITAMIN D) 2000 UNITS tablet Take 2,000 Units by mouth daily.    [provider]  clobetasol cream (TEMOVATE) 9.50 % Apply 1 application topically 2 (two) times daily.    [provider]  Cyanocobalamin (VITAMIN B 12 PO) Take 1 tablet by mouth daily.    [provider]  ezetimibe (ZETIA) 10 MG tablet Take 10 mg by mouth daily.    [provider]  losartan (COZAAR) 25 MG tablet Take 25 mg by mouth daily.     [provider]  Multiple Vitamins-Minerals (ONE-A-DAY WOMENS 50 PLUS PO) Take 1 tablet by mouth daily.    [provider]  Omega-3 Fatty Acids (FISH OIL) 1000 MG CAPS Take by mouth 2 (two) times daily.    [provider]  pantoprazole (PROTONIX) 20 MG tablet Take 20 mg by mouth as needed.    [provider]  polyethylene glycol (MIRALAX / GLYCOLAX) packet Take 17 g by mouth daily.    [provider]  Probiotic Product (PROBIOTIC DAILY PO) Take 1 capsule by mouth daily.    [provider]    Family History Family History  Problem Relation Age of Onset  . Cancer Mother        breast  . Stroke Mother   . Breast cancer Mother 23  . Cancer Father        pancreatic cancer  . Heart disease Father   . Cancer Brother        kidney cancer/brain tumor  . Heart disease Brother   . Coronary artery disease Brother   . Breast cancer Paternal Aunt   . Breast cancer Paternal Aunt     Social  History Social History   Tobacco Use  . Smoking status: Never Smoker  . Smokeless tobacco: Never Used  Substance Use Topics  . Alcohol use: No  . Drug use: No     Allergies   Latex   Review of Systems Review of Systems + knee pain, trouble walking, the rest is neg.  Physical Exam Triage Vital Signs ED Triage Vitals  Enc Vitals Group     BP 02/10/21 0847 (!) 167/83     Pulse Rate 02/10/21 0847 67     Resp 02/10/21 0847 18     Temp 02/10/21 0847 97.9 F (36.6 C)  Temp Source 02/10/21 0847 Temporal     SpO2 02/10/21 0847 97 %     Weight --      Height --      Head Circumference --      Peak Flow --      Pain Score 02/10/21 0848 8     Pain Loc --      Pain Edu? --      Excl. in [place]? --    No data found.  Updated Vital Signs BP (!) 167/83 (BP Location: Right Arm)   Pulse 67   Temp 97.9 F (36.6 C) (Temporal)   Resp 18   SpO2 97%   Visual Acuity Right Eye Distance:   Left Eye Distance:   Bilateral Distance:    Right Eye Near:   Left Eye Near:    Bilateral Near:     Physical Exam Vitals and nursing note reviewed.  Constitutional:      Appearance: She is obese.     Comments: Walking limping with a cane  HENT:     Head: Normocephalic.     Right Ear: External ear normal.     Left Ear: External ear normal.  Eyes:     Conjunctiva/sclera: Conjunctivae normal.  Pulmonary:     Effort: Pulmonary effort is normal.  Musculoskeletal:     Cervical back: Neck supple.     Right lower leg: No edema.     Left lower leg: No edema.     Comments: L KNEE- with crepitations with ROM, and ROM provoked more pain, specially when trying to flex it more than 60 degrees. Has tenderness posteriorly as well, she does have a Baker cyst present.   L LOWER LEG- no swelling or asymmetry compared to R leg noted. Has neg Hoffmans'   Skin:    General: Skin is warm and dry.     Findings: No bruising, erythema or rash.  Neurological:     Mental Status: She is alert and oriented  to person, place, and time.     Gait: Gait normal.  Psychiatric:        Mood and Affect: Mood normal.        Behavior: Behavior normal.        Thought Content: Thought content normal.        Judgment: Judgment normal.      UC Treatments / Results  Labs (all labs ordered are listed, but only abnormal results are displayed) Labs Reviewed - No data to display  EKG   Radiology No results found.  Procedures Procedures (including critical care time)  Medications Ordered in UC Medications - No data to display  Initial Impression / Assessment and Plan / UC Course  I have reviewed the triage vital signs and the nursing notes. OA and Baker's cyst L knee with acute flair of pain. I sent her to [facility] to get steroid injection to alleviate her pain.  Final Clinical Impressions(s) / UC Diagnoses   Final diagnoses:  Acute pain of left knee  Baker's cyst of knee, left     Discharge Instructions     Go to [facility] today so you can get a cortisone injection in your knee to help your pain    ED Prescriptions    None     PDMP not reviewed this encounter.   Shelby Mattocks, [place] 02/10/21 [phone]

## 2021-02-10 NOTE — Discharge Instructions (Signed)
Go to [facility] today so you can get a cortisone injection in your knee to help your pain

## 2021-02-10 NOTE — ED Triage Notes (Signed)
Left leg pain from knee down to ankle since Tuesday.  No known injury

## 2021-03-10 DIAGNOSIS — E785 Hyperlipidemia, unspecified: Secondary | ICD-10-CM | POA: Diagnosis not present

## 2021-03-10 DIAGNOSIS — I10 Essential (primary) hypertension: Secondary | ICD-10-CM | POA: Diagnosis not present

## 2021-03-10 DIAGNOSIS — K219 Gastro-esophageal reflux disease without esophagitis: Secondary | ICD-10-CM | POA: Diagnosis not present

## 2021-03-17 DIAGNOSIS — M170 Bilateral primary osteoarthritis of knee: Secondary | ICD-10-CM | POA: Diagnosis not present

## 2021-03-22 DIAGNOSIS — Z6831 Body mass index (BMI) 31.0-31.9, adult: Secondary | ICD-10-CM | POA: Diagnosis not present

## 2021-03-22 DIAGNOSIS — I10 Essential (primary) hypertension: Secondary | ICD-10-CM | POA: Diagnosis not present

## 2021-03-22 DIAGNOSIS — E785 Hyperlipidemia, unspecified: Secondary | ICD-10-CM | POA: Diagnosis not present

## 2021-03-22 DIAGNOSIS — C8239 Follicular lymphoma grade IIIa, extranodal and solid organ sites: Secondary | ICD-10-CM | POA: Diagnosis not present

## 2021-03-25 DIAGNOSIS — B351 Tinea unguium: Secondary | ICD-10-CM | POA: Diagnosis not present

## 2021-03-25 DIAGNOSIS — M79674 Pain in right toe(s): Secondary | ICD-10-CM | POA: Diagnosis not present

## 2021-03-25 DIAGNOSIS — M79675 Pain in left toe(s): Secondary | ICD-10-CM | POA: Diagnosis not present

## 2021-04-13 DIAGNOSIS — Z20822 Contact with and (suspected) exposure to covid-19: Secondary | ICD-10-CM | POA: Diagnosis not present

## 2021-04-28 ENCOUNTER — Telehealth: Payer: Self-pay

## 2021-04-28 ENCOUNTER — Other Ambulatory Visit: Payer: Self-pay | Admitting: Hematology and Oncology

## 2021-04-28 ENCOUNTER — Telehealth: Payer: Self-pay | Admitting: Hematology and Oncology

## 2021-04-28 DIAGNOSIS — Z8572 Personal history of non-Hodgkin lymphomas: Secondary | ICD-10-CM

## 2021-04-28 NOTE — Telephone Encounter (Signed)
Called to follow up on her request for appt. She is having lower jaw pain that goes down to her shoulder. She is having at times a numb feeling to her shoulder to upper arm. She saw a dentist and needs crowns replaced and a root canal to the upper teeth.

## 2021-04-28 NOTE — Telephone Encounter (Signed)
I will send LOS to see her next week

## 2021-04-28 NOTE — Telephone Encounter (Signed)
Scheduled appts per 8/18 sch msg. Pt aware.  

## 2021-04-28 NOTE — Telephone Encounter (Signed)
Appt scheduled. She is aware of time/ date.

## 2021-05-02 ENCOUNTER — Inpatient Hospital Stay: Payer: Medicare Other | Attending: Hematology and Oncology | Admitting: Hematology and Oncology

## 2021-05-02 ENCOUNTER — Inpatient Hospital Stay: Payer: Medicare Other

## 2021-05-02 ENCOUNTER — Encounter: Payer: Self-pay | Admitting: Hematology and Oncology

## 2021-05-02 ENCOUNTER — Other Ambulatory Visit: Payer: Self-pay

## 2021-05-02 ENCOUNTER — Telehealth: Payer: Self-pay | Admitting: Hematology and Oncology

## 2021-05-02 VITALS — BP 141/79 | HR 64 | Temp 97.3°F | Resp 18 | Ht 62.0 in | Wt 179.4 lb

## 2021-05-02 DIAGNOSIS — K047 Periapical abscess without sinus: Secondary | ICD-10-CM | POA: Insufficient documentation

## 2021-05-02 DIAGNOSIS — D72819 Decreased white blood cell count, unspecified: Secondary | ICD-10-CM | POA: Insufficient documentation

## 2021-05-02 DIAGNOSIS — Z8572 Personal history of non-Hodgkin lymphomas: Secondary | ICD-10-CM | POA: Diagnosis not present

## 2021-05-02 LAB — CBC WITH DIFFERENTIAL/PLATELET
Abs Immature Granulocytes: 0.01 10*3/uL (ref 0.00–0.07)
Basophils Absolute: 0.1 10*3/uL (ref 0.0–0.1)
Basophils Relative: 1 %
Eosinophils Absolute: 0.1 10*3/uL (ref 0.0–0.5)
Eosinophils Relative: 3 %
HCT: 42.2 % (ref 36.0–46.0)
Hemoglobin: 14.3 g/dL (ref 12.0–15.0)
Immature Granulocytes: 0 %
Lymphocytes Relative: 38 %
Lymphs Abs: 1.4 10*3/uL (ref 0.7–4.0)
MCH: 28.9 pg (ref 26.0–34.0)
MCHC: 33.9 g/dL (ref 30.0–36.0)
MCV: 85.4 fL (ref 80.0–100.0)
Monocytes Absolute: 0.3 10*3/uL (ref 0.1–1.0)
Monocytes Relative: 8 %
Neutro Abs: 1.9 10*3/uL (ref 1.7–7.7)
Neutrophils Relative %: 50 %
Platelets: 186 10*3/uL (ref 150–400)
RBC: 4.94 MIL/uL (ref 3.87–5.11)
RDW: 14.2 % (ref 11.5–15.5)
WBC: 3.8 10*3/uL — ABNORMAL LOW (ref 4.0–10.5)
nRBC: 0 % (ref 0.0–0.2)

## 2021-05-02 LAB — COMPREHENSIVE METABOLIC PANEL
ALT: 20 U/L (ref 0–44)
AST: 24 U/L (ref 15–41)
Albumin: 4.3 g/dL (ref 3.5–5.0)
Alkaline Phosphatase: 82 U/L (ref 38–126)
Anion gap: 11 (ref 5–15)
BUN: 16 mg/dL (ref 8–23)
CO2: 25 mmol/L (ref 22–32)
Calcium: 9.5 mg/dL (ref 8.9–10.3)
Chloride: 107 mmol/L (ref 98–111)
Creatinine, Ser: 1.08 mg/dL — ABNORMAL HIGH (ref 0.44–1.00)
GFR, Estimated: 54 mL/min — ABNORMAL LOW (ref >60)
Glucose, Bld: 91 mg/dL (ref 70–99)
Potassium: 4.3 mmol/L (ref 3.5–5.1)
Sodium: 143 mmol/L (ref 135–145)
Total Bilirubin: 0.8 mg/dL (ref 0.3–1.2)
Total Protein: 6.7 g/dL (ref 6.5–8.1)

## 2021-05-02 MED ORDER — AMOXICILLIN 500 MG PO TABS: 500.0000 mg | ORAL_TABLET | Freq: Two times a day (BID) | ORAL | 0 refills | Status: DC

## 2021-05-02 NOTE — Telephone Encounter (Signed)
Scheduled appt per 8/22 sch msg. Pt aware.

## 2021-05-02 NOTE — Assessment & Plan Note (Signed)
She has clinical signs of dental infection Her left-sided jaw pain and mild intermittent headache is likely due to this I recommend a course of antibiotics due to her symptoms and mild borderline leukopenia She will keep her appointment to see dentist next month for further follow-up

## 2021-05-02 NOTE — Assessment & Plan Note (Signed)
She has mild chronic intermittent leukopenia, stable Observe for now 

## 2021-05-02 NOTE — Assessment & Plan Note (Signed)
Her examination is benign A small palpable lymphadenopathy beneath the left submandibular region is likely reactive to dental infection I recommend a course of antibiotics and plan to see her again in 2 months for further follow-up

## 2021-05-02 NOTE — Progress Notes (Signed)
[place] OFFICE PROGRESS NOTE  Patient Care Team: [name], MD as PCP - General (Family Medicine) Minus Breeding, MD as PCP - Cardiology (Cardiology) Heath Lark, MD as Consulting Physician (Hematology and Oncology)  ASSESSMENT & PLAN:  History of B-cell lymphoma Her examination is benign A small palpable lymphadenopathy beneath the left submandibular region is likely reactive to dental infection I recommend a course of antibiotics and plan to see her again in 2 months for further follow-up  Dental infection She has clinical signs of dental infection Her left-sided jaw pain and mild intermittent headache is likely due to this I recommend a course of antibiotics due to her symptoms and mild borderline leukopenia She will keep her appointment to see dentist next month for further follow-up  Chronic leukopenia She has mild chronic intermittent leukopenia, stable Observe for now  No orders of the defined types were placed in this encounter.   All questions were answered. The patient knows to call the clinic with any problems, questions or concerns. The total time spent in the appointment was 20 minutes encounter with patients including review of chart and various tests results, discussions about plan of care and coordination of care plan   Heath Lark, MD 05/02/2021 12:39 PM  INTERVAL HISTORY: Please see below for problem oriented charting. She is seen for further follow-up She was last seen almost 3 years ago and was discharged from the clinic She has noted some left-sided intermittent jaw pain near the TMJ area radiating to the left side of her face with occasional headaches She was seen by dentist and was noted she had a crown problem and root canal problem.  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 Needle biopsy was inconclusive.     08/02/2011 Imaging   CT scan of the chest abdomen and pelvis showed numerous lymphadenopathy above and below the diaphragm.    08/16/2011 Imaging   MRI of the abdomen revealed enlarging left kidney lesion worrisome for malignancy   09/20/2011 Initial Diagnosis   Lymphoma, low grade from kidney biopsy, follicular   4/58/0998 Procedure   Histologic type: Non-Hodgkin's lymphoma, follicular center cell type. Grade (if applicable): Favor high grade, Ki-67 ranges from 10-50%. Immunohistochemical stains: CD20, CD79a, CD10, BCL-2, BCL-6, CD21, CD3, CD43.   05/09/2013 Procedure   BM biopsy is highly suspicious of involvement. Patient had persistent leukopenia   05/09/2013 Imaging   PET/CT showed significant involvement of LN everywhere   07/16/2013 - 11/05/2013 Chemotherapy   She completed 6 cycles of R. CHOP chemotherapy.   07/23/2013 - 07/27/2013 Hospital Admission   Admitted for management of mucositis and neutropenic fever. Blood culture was positive for gram negative rods,  resolved with antibiotics   10/08/2013 Adverse Reaction   The dose of vincristine is reduced by 50%   12/15/2013 Imaging   Repeat PET CT scan show complete response to treatment.   12/17/2013 - 10/20/2015 Chemotherapy   She begin maintenance rituximab every other month x 2 years    02/23/2014 Procedure   She had shave biopsy of the left upper, that came back well-differentiated squamous cell carcinoma.   04/29/2014 Imaging   CT scan of the abdomen showed no evidence of disease recurrence.   10/12/2014 Imaging   Repeat CT scan of the chest, abdomen and pelvis showed no evidence of recurrence.   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 Left trochanteric bursitis. 2. Abnormal edema in the left quadrate is femoris muscle probably from muscle strain. 3. Axial loss of articular cartilage thickness in both hips with mild associated spurring of the femoral heads. 4. Small amount of nonspecific free pelvic fluid.   10/18/2017 Imaging   1. Stable small scattered supraclavicular, mediastinal and mesenteric lymph nodes but no findings worrisome for recurrent lymphoma. 2. Single small subcutaneous lesion in the lower abdominal area has decreased in size since the prior study. No new lesions. 3. No acute abdominal or pelvic findings. No acute pulmonary findings. 4. Stable age advanced vascular calcifications.   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     REVIEW OF SYSTEMS:   Constitutional: Denies fevers, chills or abnormal weight loss Eyes: Denies blurriness of vision Ears, nose, mouth,  throat, and face: Denies mucositis or sore throat Respiratory: Denies cough, dyspnea or wheezes Cardiovascular: Denies palpitation, chest discomfort or lower extremity swelling Gastrointestinal:  Denies nausea, heartburn or change in bowel habits Skin: Denies abnormal skin rashes Neurological:Denies numbness, tingling or new weaknesses Behavioral/Psych: Mood is stable, no new changes  All other systems were reviewed with the patient and are negative.  I have reviewed the past medical history, past surgical history, social history and family history with the patient and they are unchanged from previous note.  ALLERGIES:  is allergic to latex.  MEDICATIONS:  Current Outpatient Medications  Medication Sig Dispense Refill   amoxicillin (AMOXIL) 500 MG tablet Take 1 tablet (500 mg total) by mouth 2 (two) times daily. 14 tablet 0   acetaminophen (TYLENOL) 500 MG tablet Take 250 mg by mouth every 6 (six) hours as needed for moderate pain.     aspirin 81 MG tablet Take 81 mg by mouth at bedtime.     atorvastatin (LIPITOR) 20 MG tablet Take 20 mg by mouth every other day.     cetirizine (ZYRTEC) 10 MG tablet Take 10 mg by mouth daily.     Cholecalciferol (VITAMIN D) 2000 UNITS tablet Take 2,000 Units by mouth daily.     clobetasol cream (TEMOVATE) 0.98 % Apply 1 application topically 2 (two) times daily.  Cyanocobalamin (VITAMIN B 12 PO) Take 1 tablet by mouth daily.     ezetimibe (ZETIA) 10 MG tablet Take 10 mg by mouth daily.     losartan (COZAAR) 25 MG tablet Take 25 mg by mouth daily.      Multiple Vitamins-Minerals (ONE-A-DAY WOMENS 50 PLUS PO) Take 1 tablet by mouth daily.     Omega-3 Fatty Acids (FISH OIL) 1000 MG CAPS Take by mouth 2 (two) times daily.     pantoprazole (PROTONIX) 20 MG tablet Take 20 mg by mouth as needed.     polyethylene glycol (MIRALAX / GLYCOLAX) packet Take 17 g by mouth daily.     Probiotic Product (PROBIOTIC DAILY PO) Take 1 capsule by mouth daily.     No  current facility-administered medications for this visit.    PHYSICAL EXAMINATION: ECOG PERFORMANCE STATUS: 1 - Symptomatic but completely ambulatory  Vitals:   05/02/21 1001  BP: (!) 141/79  Pulse: 64  Resp: 18  Temp: (!) 97.3 F (36.3 C)  SpO2: 99%   Filed Weights   05/02/21 1001  Weight: 179 lb 6.4 oz (81.4 kg)    GENERAL:alert, no distress and comfortable SKIN: skin color, texture, turgor are normal, no rashes or significant lesions EYES: normal, Conjunctiva are pink and non-injected, sclera clear OROPHARYNX: Noted poor dentition but no signs of gum swelling.  She has mild tenderness on palpation on the left submandibular region NECK: supple, thyroid normal size, non-tender, without nodularity LYMPH: She has a small discrete palpable lymphadenopathy measure approximately 4 mm in size in the submandibular region, most consistent with infection LUNGS: clear to auscultation and percussion with normal breathing effort HEART: regular rate & rhythm and no murmurs and no lower extremity edema ABDOMEN:abdomen soft, non-tender and normal bowel sounds Musculoskeletal:no cyanosis of digits and no clubbing  NEURO: alert & oriented x 3 with fluent speech, no focal motor/sensory deficits  LABORATORY DATA:  I have reviewed the data as listed    Component Value Date/Time   NA 143 05/02/2021 0935   NA 140 01/27/2020 1518   NA 140 01/18/2017 0757   K 4.3 05/02/2021 0935   K 4.1 01/18/2017 0757   CL 107 05/02/2021 0935   CO2 25 05/02/2021 0935   CO2 25 01/18/2017 0757   GLUCOSE 91 05/02/2021 0935   GLUCOSE 94 01/18/2017 0757   BUN 16 05/02/2021 0935   BUN 19 01/27/2020 1518   BUN 14.0 01/18/2017 0757   CREATININE 1.08 (H) 05/02/2021 0935   CREATININE 1.2 (H) 01/18/2017 0757   CALCIUM 9.5 05/02/2021 0935   CALCIUM 9.3 01/18/2017 0757   PROT 6.7 05/02/2021 0935   PROT 7.0 01/18/2017 0757   ALBUMIN 4.3 05/02/2021 0935   ALBUMIN 4.3 01/18/2017 0757   AST 24 05/02/2021 0935    AST 22 01/18/2017 0757   ALT 20 05/02/2021 0935   ALT 19 01/18/2017 0757   ALKPHOS 82 05/02/2021 0935   ALKPHOS 60 01/18/2017 0757   BILITOT 0.8 05/02/2021 0935   BILITOT 1.13 01/18/2017 0757   GFRNONAA 54 (L) 05/02/2021 0935   GFRAA 60 01/27/2020 1518    No results found for: SPEP, UPEP  Lab Results  Component Value Date   WBC 3.8 (L) 05/02/2021   NEUTROABS 1.9 05/02/2021   HGB 14.3 05/02/2021   HCT 42.2 05/02/2021   MCV 85.4 05/02/2021   PLT 186 05/02/2021      Chemistry      Component Value Date/Time   NA 143 05/02/2021 0935  NA 140 01/27/2020 1518   NA 140 01/18/2017 0757   K 4.3 05/02/2021 0935   K 4.1 01/18/2017 0757   CL 107 05/02/2021 0935   CO2 25 05/02/2021 0935   CO2 25 01/18/2017 0757   BUN 16 05/02/2021 0935   BUN 19 01/27/2020 1518   BUN 14.0 01/18/2017 0757   CREATININE 1.08 (H) 05/02/2021 0935   CREATININE 1.2 (H) 01/18/2017 0757      Component Value Date/Time   CALCIUM 9.5 05/02/2021 0935   CALCIUM 9.3 01/18/2017 0757   ALKPHOS 82 05/02/2021 0935   ALKPHOS 60 01/18/2017 0757   AST 24 05/02/2021 0935   AST 22 01/18/2017 0757   ALT 20 05/02/2021 0935   ALT 19 01/18/2017 0757   BILITOT 0.8 05/02/2021 0935   BILITOT 1.13 01/18/2017 0757

## 2021-05-11 DIAGNOSIS — I10 Essential (primary) hypertension: Secondary | ICD-10-CM | POA: Diagnosis not present

## 2021-05-11 DIAGNOSIS — E785 Hyperlipidemia, unspecified: Secondary | ICD-10-CM | POA: Diagnosis not present

## 2021-05-11 DIAGNOSIS — K219 Gastro-esophageal reflux disease without esophagitis: Secondary | ICD-10-CM | POA: Diagnosis not present

## 2021-05-12 DIAGNOSIS — M170 Bilateral primary osteoarthritis of knee: Secondary | ICD-10-CM | POA: Diagnosis not present

## 2021-05-19 DIAGNOSIS — M1711 Unilateral primary osteoarthritis, right knee: Secondary | ICD-10-CM | POA: Diagnosis not present

## 2021-05-19 DIAGNOSIS — M170 Bilateral primary osteoarthritis of knee: Secondary | ICD-10-CM | POA: Diagnosis not present

## 2021-05-19 DIAGNOSIS — M1712 Unilateral primary osteoarthritis, left knee: Secondary | ICD-10-CM | POA: Diagnosis not present

## 2021-05-27 DIAGNOSIS — M170 Bilateral primary osteoarthritis of knee: Secondary | ICD-10-CM | POA: Diagnosis not present

## 2021-06-03 DIAGNOSIS — B351 Tinea unguium: Secondary | ICD-10-CM | POA: Diagnosis not present

## 2021-06-03 DIAGNOSIS — M79675 Pain in left toe(s): Secondary | ICD-10-CM | POA: Diagnosis not present

## 2021-06-03 DIAGNOSIS — M79674 Pain in right toe(s): Secondary | ICD-10-CM | POA: Diagnosis not present

## 2021-06-10 DIAGNOSIS — K219 Gastro-esophageal reflux disease without esophagitis: Secondary | ICD-10-CM | POA: Diagnosis not present

## 2021-06-10 DIAGNOSIS — E785 Hyperlipidemia, unspecified: Secondary | ICD-10-CM | POA: Diagnosis not present

## 2021-06-10 DIAGNOSIS — I10 Essential (primary) hypertension: Secondary | ICD-10-CM | POA: Diagnosis not present

## 2021-06-16 DIAGNOSIS — Z23 Encounter for immunization: Secondary | ICD-10-CM | POA: Diagnosis not present

## 2021-06-20 ENCOUNTER — Other Ambulatory Visit: Payer: Self-pay | Admitting: Family Medicine

## 2021-06-20 DIAGNOSIS — Z1231 Encounter for screening mammogram for malignant neoplasm of breast: Secondary | ICD-10-CM

## 2021-06-28 DIAGNOSIS — I10 Essential (primary) hypertension: Secondary | ICD-10-CM | POA: Diagnosis not present

## 2021-06-28 DIAGNOSIS — C8239 Follicular lymphoma grade IIIa, extranodal and solid organ sites: Secondary | ICD-10-CM | POA: Diagnosis not present

## 2021-06-28 DIAGNOSIS — E785 Hyperlipidemia, unspecified: Secondary | ICD-10-CM | POA: Diagnosis not present

## 2021-07-04 ENCOUNTER — Encounter: Payer: Self-pay | Admitting: Hematology and Oncology

## 2021-07-04 ENCOUNTER — Inpatient Hospital Stay: Payer: Medicare Other | Attending: Hematology and Oncology | Admitting: Hematology and Oncology

## 2021-07-04 ENCOUNTER — Other Ambulatory Visit: Payer: Self-pay

## 2021-07-04 DIAGNOSIS — Z8572 Personal history of non-Hodgkin lymphomas: Secondary | ICD-10-CM

## 2021-07-04 DIAGNOSIS — I10 Essential (primary) hypertension: Secondary | ICD-10-CM | POA: Diagnosis not present

## 2021-07-04 DIAGNOSIS — E669 Obesity, unspecified: Secondary | ICD-10-CM

## 2021-07-04 DIAGNOSIS — E66811 Obesity, class 1: Secondary | ICD-10-CM | POA: Insufficient documentation

## 2021-07-04 NOTE — Assessment & Plan Note (Signed)
Her exam is completely benign Previously palpable lymphadenopathy has resolved I suspect her previous dental infection is the cause of her reactive lymphadenopathy The patient is reassured I will see her again next year for further follow-up

## 2021-07-04 NOTE — Progress Notes (Signed)
[facility] OFFICE PROGRESS NOTE  Patient Care Team: [name], MD as PCP - General (Family Medicine) Minus Breeding, MD as PCP - Cardiology (Cardiology) Heath Lark, MD as Consulting Physician (Hematology and Oncology)  ASSESSMENT & PLAN:  History of B-cell lymphoma Her exam is completely benign Previously palpable lymphadenopathy has resolved I suspect her previous dental infection is the cause of her reactive lymphadenopathy The patient is reassured I will see her again next year for further follow-up  Essential hypertension, benign Her blood pressure is elevated today likely due to anxiety She is reassured She will continue follow-up with cardiologist and primary care doctor for management  Class 1 obesity We discussed the importance of weight loss for her general health The patient appears to be motivated to lose some weight on her own  No orders of the defined types were placed in this encounter.   All questions were answered. The patient knows to call the clinic with any problems, questions or concerns. The total time spent in the appointment was 20 minutes encounter with patients including review of chart and various tests results, discussions about plan of care and coordination of care plan   Heath Lark, MD 07/04/2021 9:25 AM  INTERVAL HISTORY: Please see below for problem oriented charting. she returns with her husband for further follow-up on previously palpable lymphadenopathy Since last time I saw her, she had completed a course of antibiotic therapy and repair of broken tooth by her dentist She has no reoccurrence of dental abscess that she is aware of No recent fever or chills  REVIEW OF SYSTEMS:   Constitutional: Denies fevers, chills or abnormal weight loss Eyes: Denies blurriness of vision Ears, nose, mouth, throat, and face: Denies mucositis or sore throat Respiratory: Denies cough, dyspnea or wheezes Cardiovascular: Denies  palpitation, chest discomfort or lower extremity swelling Gastrointestinal:  Denies nausea, heartburn or change in bowel habits Skin: Denies abnormal skin rashes Lymphatics: Denies new lymphadenopathy or easy bruising Neurological:Denies numbness, tingling or new weaknesses Behavioral/Psych: Mood is stable, no new changes  All other systems were reviewed with the patient and are negative.  I have reviewed the past medical history, past surgical history, social history and family history with the patient and they are unchanged from previous note.  ALLERGIES:  is allergic to latex.  MEDICATIONS:  Current Outpatient Medications  Medication Sig Dispense Refill   acetaminophen (TYLENOL) 500 MG tablet Take 250 mg by mouth every 6 (six) hours as needed for moderate pain.     amoxicillin (AMOXIL) 500 MG tablet Take 1 tablet (500 mg total) by mouth 2 (two) times daily. 14 tablet 0   aspirin 81 MG tablet Take 81 mg by mouth at bedtime.     atorvastatin (LIPITOR) 20 MG tablet Take 20 mg by mouth every other day.     cetirizine (ZYRTEC) 10 MG tablet Take 10 mg by mouth daily.     Cholecalciferol (VITAMIN D) 2000 UNITS tablet Take 2,000 Units by mouth daily.     clobetasol cream (TEMOVATE) 3.29 % Apply 1 application topically 2 (two) times daily.     Cyanocobalamin (VITAMIN B 12 PO) Take 1 tablet by mouth daily.     ezetimibe (ZETIA) 10 MG tablet Take 10 mg by mouth daily.     losartan (COZAAR) 25 MG tablet Take 25 mg by mouth daily.      Multiple Vitamins-Minerals (ONE-A-DAY WOMENS 50 PLUS PO) Take 1 tablet by mouth daily.     Omega-3 Fatty  Acids (FISH OIL) 1000 MG CAPS Take by mouth 2 (two) times daily.     pantoprazole (PROTONIX) 20 MG tablet Take 20 mg by mouth as needed.     polyethylene glycol (MIRALAX / GLYCOLAX) packet Take 17 g by mouth daily.     Probiotic Product (PROBIOTIC DAILY PO) Take 1 capsule by mouth daily.     No current facility-administered medications for this visit.     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 Needle biopsy was inconclusive.     08/02/2011 Imaging   CT scan of the chest abdomen and pelvis showed numerous lymphadenopathy above and below the diaphragm.    08/16/2011 Imaging   MRI of the abdomen revealed enlarging left kidney lesion worrisome for malignancy   09/20/2011 Initial Diagnosis   Lymphoma, low grade from kidney biopsy, follicular   7/82/9562 Procedure   Histologic type: Non-Hodgkin's lymphoma, follicular center cell type. Grade (if applicable): Favor high grade, Ki-67 ranges from 10-50%. Immunohistochemical stains: CD20, CD79a, CD10, BCL-2, BCL-6, CD21, CD3, CD43.   05/09/2013 Procedure   BM biopsy is highly suspicious of involvement. Patient had persistent leukopenia   05/09/2013 Imaging   PET/CT showed significant involvement of LN everywhere   07/16/2013 - 11/05/2013 Chemotherapy   She completed 6 cycles of R. CHOP chemotherapy.   07/23/2013 - 07/27/2013 Hospital Admission   Admitted for management of mucositis and neutropenic fever. Blood culture was positive for gram negative rods, resolved with antibiotics   10/08/2013 Adverse Reaction   The dose of vincristine is reduced by 50%   12/15/2013 Imaging   Repeat PET CT scan show complete response to treatment.   12/17/2013 - 10/20/2015 Chemotherapy   She begin maintenance rituximab  every other month x 2 years   02/23/2014 Procedure   She had shave biopsy of the left upper, that came back well-differentiated squamous cell carcinoma.   04/29/2014 Imaging   CT scan of the abdomen showed no evidence of disease recurrence.   10/12/2014 Imaging   Repeat CT scan of the chest, abdomen and pelvis showed no evidence of recurrence.   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 Left trochanteric bursitis. 2. Abnormal edema in the left quadrate is femoris muscle probably from muscle strain. 3. Axial loss of articular cartilage thickness in both hips with mild associated spurring of the femoral heads. 4. Small amount of nonspecific free pelvic fluid.   10/18/2017 Imaging   1. Stable small scattered supraclavicular, mediastinal and mesenteric lymph nodes but  no findings worrisome for recurrent lymphoma. 2. Single small subcutaneous lesion in the lower abdominal area has decreased in size since the prior study. No new lesions. 3. No acute abdominal or pelvic findings. No acute pulmonary findings. 4. Stable age advanced vascular calcifications.   Follicular lymphoma (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     PHYSICAL EXAMINATION: ECOG PERFORMANCE STATUS: 0 - Asymptomatic  Vitals:   07/04/21 0825  BP: (!) 151/69   Pulse: 65  Resp: 18  Temp: 97.6 F (36.4 C)  SpO2: 99%   Filed Weights   07/04/21 0825  Weight: 183 lb 3.2 oz (83.1 kg)    GENERAL:alert, no distress and comfortable SKIN: skin color, texture, turgor are normal, no rashes or significant lesions EYES: normal, Conjunctiva are pink and non-injected, sclera clear OROPHARYNX:no exudate, no erythema and lips, buccal mucosa, and tongue normal  NECK: supple, thyroid normal size, non-tender, without nodularity.  Previously palpable lymphadenopathy has resolved NEURO: alert & oriented x 3 with fluent speech, no focal motor/sensory deficits  LABORATORY DATA:  I have reviewed the data as listed    Component Value Date/Time   NA 143 05/02/2021 0935   NA 140 01/27/2020 1518   NA 140 01/18/2017 0757   K 4.3 05/02/2021 0935   K 4.1 01/18/2017 0757   CL 107 05/02/2021 0935   CO2 25 05/02/2021 0935   CO2 25 01/18/2017 0757   GLUCOSE 91 05/02/2021 0935   GLUCOSE 94 01/18/2017 0757   BUN 16 05/02/2021 0935   BUN 19 01/27/2020 1518   BUN 14.0 01/18/2017 0757   CREATININE 1.08 (H) 05/02/2021 0935   CREATININE 1.2 (H) 01/18/2017 0757   CALCIUM 9.5 05/02/2021 0935   CALCIUM 9.3 01/18/2017 0757   PROT 6.7 05/02/2021 0935   PROT 7.0 01/18/2017 0757   ALBUMIN 4.3 05/02/2021 0935   ALBUMIN 4.3 01/18/2017 0757   AST 24 05/02/2021 0935   AST 22 01/18/2017 0757   ALT 20 05/02/2021 0935   ALT 19 01/18/2017 0757   ALKPHOS 82 05/02/2021 0935   ALKPHOS 60 01/18/2017 0757   BILITOT 0.8 05/02/2021 0935   BILITOT 1.13 01/18/2017 0757   GFRNONAA 54 (L) 05/02/2021 0935   GFRAA 60 01/27/2020 1518    No results found for: SPEP, UPEP  Lab Results  Component Value Date   WBC 3.8 (L) 05/02/2021   NEUTROABS 1.9 05/02/2021   HGB 14.3 05/02/2021   HCT 42.2 05/02/2021   MCV 85.4 05/02/2021   PLT 186 05/02/2021      Chemistry      Component Value Date/Time   NA 143 05/02/2021 0935   NA 140 01/27/2020 1518   NA 140 01/18/2017 0757   K 4.3  05/02/2021 0935   K 4.1 01/18/2017 0757   CL 107 05/02/2021 0935   CO2 25 05/02/2021 0935   CO2 25 01/18/2017 0757   BUN 16 05/02/2021 0935   BUN 19 01/27/2020 1518   BUN 14.0 01/18/2017 0757   CREATININE 1.08 (H) 05/02/2021 0935   CREATININE 1.2 (H) 01/18/2017 0757      Component Value Date/Time   CALCIUM 9.5 05/02/2021 0935   CALCIUM 9.3 01/18/2017 0757   ALKPHOS 82 05/02/2021 0935   ALKPHOS 60 01/18/2017 0757  AST 24 05/02/2021 0935   AST 22 01/18/2017 0757   ALT 20 05/02/2021 0935   ALT 19 01/18/2017 0757   BILITOT 0.8 05/02/2021 0935   BILITOT 1.13 01/18/2017 0757

## 2021-07-04 NOTE — Assessment & Plan Note (Signed)
We discussed the importance of weight loss for her general health The patient appears to be motivated to lose some weight on her own

## 2021-07-04 NOTE — Assessment & Plan Note (Signed)
Her blood pressure is elevated today likely due to anxiety She is reassured She will continue follow-up with cardiologist and primary care doctor for management

## 2021-07-11 DIAGNOSIS — I10 Essential (primary) hypertension: Secondary | ICD-10-CM | POA: Diagnosis not present

## 2021-07-11 DIAGNOSIS — E785 Hyperlipidemia, unspecified: Secondary | ICD-10-CM | POA: Diagnosis not present

## 2021-07-11 DIAGNOSIS — K219 Gastro-esophageal reflux disease without esophagitis: Secondary | ICD-10-CM | POA: Diagnosis not present

## 2021-07-19 DIAGNOSIS — K219 Gastro-esophageal reflux disease without esophagitis: Secondary | ICD-10-CM | POA: Diagnosis not present

## 2021-07-19 DIAGNOSIS — I10 Essential (primary) hypertension: Secondary | ICD-10-CM | POA: Diagnosis not present

## 2021-07-19 DIAGNOSIS — E785 Hyperlipidemia, unspecified: Secondary | ICD-10-CM | POA: Diagnosis not present

## 2021-07-19 DIAGNOSIS — C8239 Follicular lymphoma grade IIIa, extranodal and solid organ sites: Secondary | ICD-10-CM | POA: Diagnosis not present

## 2021-07-21 DIAGNOSIS — Z20822 Contact with and (suspected) exposure to covid-19: Secondary | ICD-10-CM | POA: Diagnosis not present

## 2021-07-28 DIAGNOSIS — L439 Lichen planus, unspecified: Secondary | ICD-10-CM | POA: Diagnosis not present

## 2021-08-01 DIAGNOSIS — Z20822 Contact with and (suspected) exposure to covid-19: Secondary | ICD-10-CM | POA: Diagnosis not present

## 2021-08-10 ENCOUNTER — Ambulatory Visit
Admission: RE | Admit: 2021-08-10 | Discharge: 2021-08-10 | Disposition: A | Discharge status: Home / Self Care | Payer: Medicare Other | Source: Ambulatory Visit | Attending: Family Medicine | Admitting: Family Medicine

## 2021-08-10 DIAGNOSIS — Z1231 Encounter for screening mammogram for malignant neoplasm of breast: Secondary | ICD-10-CM

## 2021-08-19 DIAGNOSIS — M79674 Pain in right toe(s): Secondary | ICD-10-CM | POA: Diagnosis not present

## 2021-08-19 DIAGNOSIS — M79675 Pain in left toe(s): Secondary | ICD-10-CM | POA: Diagnosis not present

## 2021-08-19 DIAGNOSIS — B351 Tinea unguium: Secondary | ICD-10-CM | POA: Diagnosis not present

## 2021-09-09 DIAGNOSIS — K219 Gastro-esophageal reflux disease without esophagitis: Secondary | ICD-10-CM | POA: Diagnosis not present

## 2021-09-09 DIAGNOSIS — E785 Hyperlipidemia, unspecified: Secondary | ICD-10-CM | POA: Diagnosis not present

## 2021-09-09 DIAGNOSIS — I10 Essential (primary) hypertension: Secondary | ICD-10-CM | POA: Diagnosis not present

## 2021-09-14 DIAGNOSIS — J069 Acute upper respiratory infection, unspecified: Secondary | ICD-10-CM | POA: Diagnosis not present

## 2021-09-27 DIAGNOSIS — I10 Essential (primary) hypertension: Secondary | ICD-10-CM | POA: Diagnosis not present

## 2021-09-27 DIAGNOSIS — J069 Acute upper respiratory infection, unspecified: Secondary | ICD-10-CM | POA: Diagnosis not present

## 2021-09-28 DIAGNOSIS — J069 Acute upper respiratory infection, unspecified: Secondary | ICD-10-CM | POA: Diagnosis not present

## 2021-09-29 DIAGNOSIS — Z23 Encounter for immunization: Secondary | ICD-10-CM | POA: Diagnosis not present

## 2021-10-04 DIAGNOSIS — Z20822 Contact with and (suspected) exposure to covid-19: Secondary | ICD-10-CM | POA: Diagnosis not present

## 2021-10-06 DIAGNOSIS — Z20822 Contact with and (suspected) exposure to covid-19: Secondary | ICD-10-CM | POA: Diagnosis not present

## 2021-10-08 DIAGNOSIS — Z20822 Contact with and (suspected) exposure to covid-19: Secondary | ICD-10-CM | POA: Diagnosis not present

## 2021-10-13 DIAGNOSIS — L820 Inflamed seborrheic keratosis: Secondary | ICD-10-CM | POA: Diagnosis not present

## 2021-10-13 DIAGNOSIS — L858 Other specified epidermal thickening: Secondary | ICD-10-CM | POA: Diagnosis not present

## 2021-10-21 DIAGNOSIS — Z20822 Contact with and (suspected) exposure to covid-19: Secondary | ICD-10-CM | POA: Diagnosis not present

## 2021-10-27 DIAGNOSIS — M79674 Pain in right toe(s): Secondary | ICD-10-CM | POA: Diagnosis not present

## 2021-10-27 DIAGNOSIS — B351 Tinea unguium: Secondary | ICD-10-CM | POA: Diagnosis not present

## 2021-10-27 DIAGNOSIS — M79675 Pain in left toe(s): Secondary | ICD-10-CM | POA: Diagnosis not present

## 2021-10-27 DIAGNOSIS — Z20822 Contact with and (suspected) exposure to covid-19: Secondary | ICD-10-CM | POA: Diagnosis not present

## 2021-11-07 DIAGNOSIS — Z20822 Contact with and (suspected) exposure to covid-19: Secondary | ICD-10-CM | POA: Diagnosis not present

## 2021-11-09 DIAGNOSIS — Z20822 Contact with and (suspected) exposure to covid-19: Secondary | ICD-10-CM | POA: Diagnosis not present

## 2021-11-12 DIAGNOSIS — Z20822 Contact with and (suspected) exposure to covid-19: Secondary | ICD-10-CM | POA: Diagnosis not present

## 2021-11-24 DIAGNOSIS — Z1152 Encounter for screening for COVID-19: Secondary | ICD-10-CM | POA: Diagnosis not present

## 2021-11-24 DIAGNOSIS — Z20828 Contact with and (suspected) exposure to other viral communicable diseases: Secondary | ICD-10-CM | POA: Diagnosis not present

## 2021-12-08 DIAGNOSIS — Z20822 Contact with and (suspected) exposure to covid-19: Secondary | ICD-10-CM | POA: Diagnosis not present

## 2021-12-09 DIAGNOSIS — E785 Hyperlipidemia, unspecified: Secondary | ICD-10-CM | POA: Diagnosis not present

## 2021-12-09 DIAGNOSIS — K219 Gastro-esophageal reflux disease without esophagitis: Secondary | ICD-10-CM | POA: Diagnosis not present

## 2021-12-09 DIAGNOSIS — I10 Essential (primary) hypertension: Secondary | ICD-10-CM | POA: Diagnosis not present

## 2021-12-12 DIAGNOSIS — Z20822 Contact with and (suspected) exposure to covid-19: Secondary | ICD-10-CM | POA: Diagnosis not present

## 2021-12-14 DIAGNOSIS — Z20822 Contact with and (suspected) exposure to covid-19: Secondary | ICD-10-CM | POA: Diagnosis not present

## 2021-12-19 DIAGNOSIS — Z20822 Contact with and (suspected) exposure to covid-19: Secondary | ICD-10-CM | POA: Diagnosis not present

## 2021-12-20 DIAGNOSIS — Z20822 Contact with and (suspected) exposure to covid-19: Secondary | ICD-10-CM | POA: Diagnosis not present

## 2021-12-21 DIAGNOSIS — H04123 Dry eye syndrome of bilateral lacrimal glands: Secondary | ICD-10-CM | POA: Diagnosis not present

## 2021-12-21 DIAGNOSIS — H43812 Vitreous degeneration, left eye: Secondary | ICD-10-CM | POA: Diagnosis not present

## 2021-12-21 DIAGNOSIS — Z961 Presence of intraocular lens: Secondary | ICD-10-CM | POA: Diagnosis not present

## 2021-12-21 DIAGNOSIS — H2511 Age-related nuclear cataract, right eye: Secondary | ICD-10-CM | POA: Diagnosis not present

## 2021-12-27 DIAGNOSIS — E785 Hyperlipidemia, unspecified: Secondary | ICD-10-CM | POA: Diagnosis not present

## 2021-12-27 DIAGNOSIS — C8239 Follicular lymphoma grade IIIa, extranodal and solid organ sites: Secondary | ICD-10-CM | POA: Diagnosis not present

## 2021-12-27 DIAGNOSIS — I10 Essential (primary) hypertension: Secondary | ICD-10-CM | POA: Diagnosis not present

## 2021-12-27 DIAGNOSIS — M170 Bilateral primary osteoarthritis of knee: Secondary | ICD-10-CM | POA: Diagnosis not present

## 2021-12-30 DIAGNOSIS — Z20822 Contact with and (suspected) exposure to covid-19: Secondary | ICD-10-CM | POA: Diagnosis not present

## 2022-01-05 DIAGNOSIS — B351 Tinea unguium: Secondary | ICD-10-CM | POA: Diagnosis not present

## 2022-01-05 DIAGNOSIS — M79675 Pain in left toe(s): Secondary | ICD-10-CM | POA: Diagnosis not present

## 2022-01-05 DIAGNOSIS — M79674 Pain in right toe(s): Secondary | ICD-10-CM | POA: Diagnosis not present

## 2022-01-09 DIAGNOSIS — Z20822 Contact with and (suspected) exposure to covid-19: Secondary | ICD-10-CM | POA: Diagnosis not present

## 2022-01-11 DIAGNOSIS — Z20822 Contact with and (suspected) exposure to covid-19: Secondary | ICD-10-CM | POA: Diagnosis not present

## 2022-01-12 DIAGNOSIS — R059 Cough, unspecified: Secondary | ICD-10-CM | POA: Diagnosis not present

## 2022-01-12 DIAGNOSIS — R051 Acute cough: Secondary | ICD-10-CM | POA: Diagnosis not present

## 2022-01-12 DIAGNOSIS — Z20822 Contact with and (suspected) exposure to covid-19: Secondary | ICD-10-CM | POA: Diagnosis not present

## 2022-01-13 DIAGNOSIS — Z20828 Contact with and (suspected) exposure to other viral communicable diseases: Secondary | ICD-10-CM | POA: Diagnosis not present

## 2022-01-16 DIAGNOSIS — Z20822 Contact with and (suspected) exposure to covid-19: Secondary | ICD-10-CM | POA: Diagnosis not present

## 2022-01-29 DIAGNOSIS — H2511 Age-related nuclear cataract, right eye: Secondary | ICD-10-CM | POA: Diagnosis not present

## 2022-02-02 DIAGNOSIS — H2511 Age-related nuclear cataract, right eye: Secondary | ICD-10-CM | POA: Diagnosis not present

## 2022-02-08 DIAGNOSIS — E785 Hyperlipidemia, unspecified: Secondary | ICD-10-CM | POA: Diagnosis not present

## 2022-02-08 DIAGNOSIS — I10 Essential (primary) hypertension: Secondary | ICD-10-CM | POA: Diagnosis not present

## 2022-02-23 ENCOUNTER — Ambulatory Visit: Payer: Medicare Other | Admitting: Cardiology

## 2022-03-10 DIAGNOSIS — E785 Hyperlipidemia, unspecified: Secondary | ICD-10-CM | POA: Diagnosis not present

## 2022-03-10 DIAGNOSIS — I10 Essential (primary) hypertension: Secondary | ICD-10-CM | POA: Diagnosis not present

## 2022-03-11 ENCOUNTER — Other Ambulatory Visit: Payer: Self-pay

## 2022-03-11 ENCOUNTER — Emergency Department (HOSPITAL_COMMUNITY)
Admission: EM | Admit: 2022-03-11 | Discharge: 2022-03-11 | Disposition: A | Discharge status: Home / Self Care | Payer: Medicare Other | Attending: Emergency Medicine | Admitting: Emergency Medicine

## 2022-03-11 ENCOUNTER — Encounter (HOSPITAL_COMMUNITY): Payer: Self-pay | Admitting: Emergency Medicine

## 2022-03-11 DIAGNOSIS — H9312 Tinnitus, left ear: Secondary | ICD-10-CM | POA: Insufficient documentation

## 2022-03-11 DIAGNOSIS — H61892 Other specified disorders of left external ear: Secondary | ICD-10-CM | POA: Diagnosis not present

## 2022-03-11 DIAGNOSIS — T162XXA Foreign body in left ear, initial encounter: Secondary | ICD-10-CM | POA: Diagnosis not present

## 2022-03-11 DIAGNOSIS — H938X2 Other specified disorders of left ear: Secondary | ICD-10-CM | POA: Diagnosis not present

## 2022-03-11 DIAGNOSIS — Z7982 Long term (current) use of aspirin: Secondary | ICD-10-CM | POA: Insufficient documentation

## 2022-03-11 NOTE — ED Triage Notes (Signed)
Pt to the ED with a foreign object in her left ear. Pt states there is no pain but it is irritating.

## 2022-03-11 NOTE — Discharge Instructions (Addendum)
Please give your primary care doctor later next week if the ringing in your ears persist.  Please do not stick any Q-tips in your ear or flush her ear.  It is likely irritated and needs rest.  Please return to the emergency department for any worsening symptoms may have.

## 2022-03-11 NOTE — ED Provider Notes (Signed)
[facility] EMERGENCY DEPARTMENT Provider Note   CSN: [id number] Arrival date & time: 03/11/22  1556     History Chief Complaint  Patient presents with   Foreign Body in [place] is a 76 y.o. female patient who presents to the emergency department with a foreign body sensation in the left ear.  She is also complaining of tinnitus in the left ear as well.  This has been ongoing for couple of days.  Patient is irrigated the ear numerous times.  Denies any hearing loss.   Foreign Body in [place] Medications Prior to Admission medications   Medication Sig Start Date End Date Taking? Authorizing Provider  acetaminophen (TYLENOL) 500 MG tablet Take 250 mg by mouth every 6 (six) hours as needed for moderate pain.    [provider]  amoxicillin (AMOXIL) 500 MG tablet Take 1 tablet (500 mg total) by mouth 2 (two) times daily. 05/02/21   Heath Lark, MD  aspirin 81 MG tablet Take 81 mg by mouth at bedtime.    [provider]  atorvastatin (LIPITOR) 20 MG tablet Take 20 mg by mouth every other day. 12/18/19   [provider]  cetirizine (ZYRTEC) 10 MG tablet Take 10 mg by mouth daily.    [provider]  Cholecalciferol (VITAMIN D) 2000 UNITS tablet Take 2,000 Units by mouth daily.    [provider]  clobetasol cream (TEMOVATE) 3.29 % Apply 1 application topically 2 (two) times daily.    [provider]  Cyanocobalamin (VITAMIN B 12 PO) Take 1 tablet by mouth daily.    [provider]  ezetimibe (ZETIA) 10 MG tablet Take 10 mg by mouth daily.    [provider]  losartan (COZAAR) 25 MG tablet Take 25 mg by mouth daily.     [provider]  Multiple Vitamins-Minerals (ONE-A-DAY WOMENS 50 PLUS PO) Take 1 tablet by mouth daily.    [provider]  Omega-3 Fatty Acids (FISH OIL) 1000 MG CAPS Take by mouth 2 (two) times daily.    [provider]  pantoprazole (PROTONIX) 20 MG  tablet Take 20 mg by mouth as needed.    [provider]  polyethylene glycol (MIRALAX / GLYCOLAX) packet Take 17 g by mouth daily.    [provider]  Probiotic Product (PROBIOTIC DAILY PO) Take 1 capsule by mouth daily.    [provider]      Allergies    Latex    Review of Systems   Review of Systems  All other systems reviewed and are negative.   Physical Exam Updated Vital Signs BP (!) 147/70   Pulse 75   Temp 98 F (36.7 C) (Oral)   Resp 18   Wt 81.6 kg   SpO2 100%   BMI 32.92 kg/m  Physical Exam Vitals and nursing note reviewed.  Constitutional:      Appearance: Normal appearance.  HENT:     Head: Normocephalic and atraumatic.     Ears:     Comments: Left external auditory canal is normal apart from some slight erythema on the inferior aspect.  TM appears normal without any evidence of bulging or rupture.  I do not see any evidence of foreign body in the left ear. Eyes:     General:        Right eye: No discharge.        Left eye: No discharge.  Conjunctiva/sclera: Conjunctivae normal.  Pulmonary:     Effort: Pulmonary effort is normal.  Skin:    General: Skin is warm and dry.     Findings: No rash.  Neurological:     General: No focal deficit present.     Mental Status: She is alert.  Psychiatric:        Mood and Affect: Mood normal.        Behavior: Behavior normal.     ED Results / Procedures / Treatments   Labs (all labs ordered are listed, but only abnormal results are displayed) Labs Reviewed - No data to display  EKG None  Radiology No results found.  Procedures Procedures    Medications Ordered in ED Medications - No data to display  ED Course/ Medical Decision Making/ A&P                           Medical Decision Making ARDICE BOYAN is a 76 y.o. female patient presents to the emergency department today for further evaluation of a foreign body sensation left ear.  There is no evidence of foreign  body in the left ear.  We will have her follow-up with her primary care doctor late next week if the tinnitus continues to persist for possible ENT referral.  Patient amenable this plan.  She is safe for discharge.    Final Clinical Impression(s) / ED Diagnoses Final diagnoses:  Foreign body sensation in left ear canal  Tinnitus of left ear    Rx / DC Orders ED Discharge Orders     None         Hendricks Limes, [place] 03/11/22 1638    Hayden Rasmussen, MD 03/12/22 1108

## 2022-03-16 DIAGNOSIS — R234 Changes in skin texture: Secondary | ICD-10-CM | POA: Diagnosis not present

## 2022-03-16 DIAGNOSIS — M79674 Pain in right toe(s): Secondary | ICD-10-CM | POA: Diagnosis not present

## 2022-03-16 DIAGNOSIS — B351 Tinea unguium: Secondary | ICD-10-CM | POA: Diagnosis not present

## 2022-03-16 DIAGNOSIS — M79675 Pain in left toe(s): Secondary | ICD-10-CM | POA: Diagnosis not present

## 2022-03-16 NOTE — Progress Notes (Signed)
Cardiology Office Note   Date:  03/17/2022   ID:  Joyce, Kennedy 1946-08-21, MRN [id number]  PCP:  [name], MD  Cardiologist:   Minus Breeding, MD   Chief Complaint  Patient presents with   Edema      History of Present Illness: [name] is a 76 y.o. female who presents for follow up of a coronary anomaly.  I last saw her in 2016.  She returns for follow-up. She was in the ED in October with chest pain.    There was no evidence of ischemia.  The pain was felt to be GI.     She had a CTA with mild calcium and no obstructive CAD.  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  She returns for follow up.    She has done well.  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  She goes up and down 3 flights of stairs routinely. The patient denies any new symptoms such as chest discomfort, neck or arm discomfort. There has been no new shortness of breath, PND or orthopnea. There have been no reported palpitations, presyncope or syncope.   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  B-cell, favor follicular NHL.  CD79a, CD20 and CD10 positive.  Cyclin D1 negative.    Fungal infection 08/01/2013   GERD (gastroesophageal reflux disease)    Hemorrhoids 08/05/2013   Hepatic steatosis 10/20/2015   History of B-cell lymphoma 09/25/2011   Core needle biopsy of left kidney mass--lower pole 09/30/11.  B-cell, favor follicular NHL.  CD79a, CD20 and CD10 positive.   Cyclin D1 negative.    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 03/24/2014   Syncope 10/14/2013   Thyroid disease     Past Surgical History:  Procedure Laterality Date   ABDOMINAL HYSTERECTOMY     BONE MARROW ASPIRATION  05/09/2013   BONE MARROW BIOPSY  05/09/2013   BREAST EXCISIONAL BIOPSY Bilateral    x4 1970s   KNEE ARTHROSCOPY W/ DEBRIDEMENT  05/2011   right   LYMPH NODE DISSECTION  x 3 different times   behind left ear 2012/ left clavical area   MR BREAST BILATERAL  cyst removal   TUBAL LIGATION       Current Outpatient Medications  Medication Sig Dispense Refill   acetaminophen (TYLENOL) 500 MG tablet Take 250 mg by mouth every 6 (six) hours as needed for moderate pain.     amlodipine-atorvastatin (CADUET) 10-10 MG  tablet      aspirin 81 MG tablet Take 81 mg by mouth at bedtime.     cetirizine (ZYRTEC) 10 MG tablet Take 10 mg by mouth daily.     Cholecalciferol (VITAMIN D) 2000 UNITS tablet Take 2,000 Units by mouth daily.     clobetasol cream (TEMOVATE) 0.93 % Apply 1 application topically 2 (two) times daily.     Cyanocobalamin (VITAMIN B 12 PO) Take 1 tablet by mouth daily.     ezetimibe (ZETIA) 10 MG tablet Take 10 mg by mouth daily.     losartan (COZAAR) 25 MG tablet Take 25 mg by mouth daily.      Multiple Vitamins-Minerals (ONE-A-DAY WOMENS 50 PLUS PO) Take 1 tablet by mouth daily.     Omega-3 Fatty Acids (FISH OIL) 1000 MG CAPS Take by mouth 2 (two) times daily.     pantoprazole (PROTONIX) 20 MG tablet Take 20 mg by mouth as needed.     polyethylene  glycol (MIRALAX / GLYCOLAX) packet Take 17 g by mouth daily.     Probiotic Product (PROBIOTIC DAILY PO) Take 1 capsule by mouth daily.     spironolactone (ALDACTONE) 25 MG tablet Take 25 mg by mouth every morning.     amoxicillin (AMOXIL) 500 MG tablet Take 1 tablet (500 mg total) by mouth 2 (two) times daily. (Patient not taking: Reported on 03/17/2022) 14 tablet 0   atorvastatin (LIPITOR) 20 MG tablet Take 20 mg by mouth every other day. (Patient not taking: Reported on 03/17/2022)     No current facility-administered medications for this visit.    Allergies:   Latex    ROS:  Please see the history of present illness.   Otherwise, review of systems are positive for none.   All other systems are reviewed and negative.    PHYSICAL EXAM: VS:  BP (!) 142/80   Pulse 69   Ht _0  (1.575 m)   Wt 184 lb 3.2 oz (83.6 kg)   SpO2 98%   BMI 33.69 kg/m  , BMI Body mass index is 33.69 kg/m. GENERAL:  Well appearing NECK:  No jugular venous distention, waveform within normal limits, carotid upstroke brisk and symmetric, no bruits, no thyromegaly LUNGS:  Clear to auscultation bilaterally CHEST:  Unremarkable HEART:  PMI not displaced or sustained,S1 and S2 within normal limits, no S3, no S4, no clicks, no rubs, no murmurs ABD:  Flat, positive bowel sounds normal in frequency in pitch, no bruits, no rebound, no guarding, no midline pulsatile mass, no hepatomegaly, no splenomegaly EXT:  2 plus pulses throughout, no edema, no cyanosis no clubbing   EKG:  EKG is  ordered today. The ekg ordered today demonstrates sinus rhythm, rate no, axis within normal limits, intervals within normal limits, no acute ST-T wave changes.   Recent Labs: 05/02/2021: ALT 20; BUN 16; Creatinine, Ser 1.08; Hemoglobin 14.3; Platelets 186; Potassium 4.3; Sodium 143    Lipid Panel    Component Value Date/Time   CHOL 238 (H) 10/29/2019 0941   CHOL 228 (H) 07/22/2019 1449   TRIG 81 10/29/2019 0941   HDL 61 10/29/2019  0941   HDL 60 07/22/2019 1449   CHOLHDL 3.9 10/29/2019 0941   LDLCALC 159 (H) 10/29/2019 0941      Wt Readings from Last 3 Encounters:  03/17/22 184 lb 3.2 oz (83.6 kg)  03/11/22 180 lb (81.6 kg)  07/04/21 183 lb 3.2 oz (83.1 kg)      Other studies Reviewed: Additional  studies/ records that were reviewed today include: Labs Review of the above records demonstrates:  Please see elsewhere in the note.     ASSESSMENT AND PLAN:  CORONARY ARTERY ANOMALY, CONGENITAL The patient has no new sypmtoms.  No further cardiovascular testing is indicated.  We will continue with aggressive risk reduction and meds as listed.  ESSENTIAL HYPERTENSION, BENIGN The blood pressure is mildly elevated but this is unusual.  No change in therapy.  HYPERLIPIDEMIA Her LDL was 83 with an HDL of 58.  No change in therapy.   EDEMA:     This is mild at the end of the day.  No change in therapy.  Current medicines are reviewed at length with the patient today.  The patient does not have concerns regarding medicines.  The following changes have been made: None  Labs/ tests ordered today include: None  Orders Placed This Encounter  Procedures   EKG 12-Lead     Disposition:   FU with me in 12 months   Signed, Minus Breeding, MD  03/17/2022 9:13 AM    [place]

## 2022-03-17 ENCOUNTER — Encounter: Payer: Self-pay | Admitting: Cardiology

## 2022-03-17 ENCOUNTER — Ambulatory Visit (INDEPENDENT_AMBULATORY_CARE_PROVIDER_SITE_OTHER): Payer: Medicare Other | Admitting: Cardiology

## 2022-03-17 VITALS — BP 142/80 | HR 69 | Ht 62.0 in | Wt 184.2 lb

## 2022-03-17 DIAGNOSIS — I10 Essential (primary) hypertension: Secondary | ICD-10-CM | POA: Diagnosis not present

## 2022-03-17 DIAGNOSIS — I2510 Atherosclerotic heart disease of native coronary artery without angina pectoris: Secondary | ICD-10-CM

## 2022-03-17 DIAGNOSIS — E785 Hyperlipidemia, unspecified: Secondary | ICD-10-CM | POA: Diagnosis not present

## 2022-03-17 DIAGNOSIS — M7989 Other specified soft tissue disorders: Secondary | ICD-10-CM | POA: Diagnosis not present

## 2022-03-17 NOTE — Patient Instructions (Signed)

## 2022-04-06 DIAGNOSIS — L989 Disorder of the skin and subcutaneous tissue, unspecified: Secondary | ICD-10-CM | POA: Diagnosis not present

## 2022-04-10 DIAGNOSIS — I10 Essential (primary) hypertension: Secondary | ICD-10-CM | POA: Diagnosis not present

## 2022-04-10 DIAGNOSIS — E785 Hyperlipidemia, unspecified: Secondary | ICD-10-CM | POA: Diagnosis not present

## 2022-04-10 DIAGNOSIS — Z23 Encounter for immunization: Secondary | ICD-10-CM | POA: Diagnosis not present

## 2022-04-10 DIAGNOSIS — K219 Gastro-esophageal reflux disease without esophagitis: Secondary | ICD-10-CM | POA: Diagnosis not present

## 2022-05-02 DIAGNOSIS — K219 Gastro-esophageal reflux disease without esophagitis: Secondary | ICD-10-CM | POA: Diagnosis not present

## 2022-05-02 DIAGNOSIS — E785 Hyperlipidemia, unspecified: Secondary | ICD-10-CM | POA: Diagnosis not present

## 2022-05-02 DIAGNOSIS — Z0000 Encounter for general adult medical examination without abnormal findings: Secondary | ICD-10-CM | POA: Diagnosis not present

## 2022-05-02 DIAGNOSIS — Z6833 Body mass index (BMI) 33.0-33.9, adult: Secondary | ICD-10-CM | POA: Diagnosis not present

## 2022-05-02 DIAGNOSIS — N390 Urinary tract infection, site not specified: Secondary | ICD-10-CM | POA: Diagnosis not present

## 2022-05-02 DIAGNOSIS — I10 Essential (primary) hypertension: Secondary | ICD-10-CM | POA: Diagnosis not present

## 2022-05-02 DIAGNOSIS — C8239 Follicular lymphoma grade IIIa, extranodal and solid organ sites: Secondary | ICD-10-CM | POA: Diagnosis not present

## 2022-05-11 DIAGNOSIS — K219 Gastro-esophageal reflux disease without esophagitis: Secondary | ICD-10-CM | POA: Diagnosis not present

## 2022-05-11 DIAGNOSIS — E785 Hyperlipidemia, unspecified: Secondary | ICD-10-CM | POA: Diagnosis not present

## 2022-05-11 DIAGNOSIS — I10 Essential (primary) hypertension: Secondary | ICD-10-CM | POA: Diagnosis not present

## 2022-05-12 DIAGNOSIS — R3915 Urgency of urination: Secondary | ICD-10-CM | POA: Diagnosis not present

## 2022-05-12 DIAGNOSIS — R350 Frequency of micturition: Secondary | ICD-10-CM | POA: Diagnosis not present

## 2022-05-12 DIAGNOSIS — R809 Proteinuria, unspecified: Secondary | ICD-10-CM | POA: Diagnosis not present

## 2022-05-12 DIAGNOSIS — Z6833 Body mass index (BMI) 33.0-33.9, adult: Secondary | ICD-10-CM | POA: Diagnosis not present

## 2022-05-16 DIAGNOSIS — H532 Diplopia: Secondary | ICD-10-CM | POA: Diagnosis not present

## 2022-05-16 DIAGNOSIS — H0102B Squamous blepharitis left eye, upper and lower eyelids: Secondary | ICD-10-CM | POA: Diagnosis not present

## 2022-05-16 DIAGNOSIS — H0102A Squamous blepharitis right eye, upper and lower eyelids: Secondary | ICD-10-CM | POA: Diagnosis not present

## 2022-05-16 DIAGNOSIS — H04123 Dry eye syndrome of bilateral lacrimal glands: Secondary | ICD-10-CM | POA: Diagnosis not present

## 2022-05-18 DIAGNOSIS — D485 Neoplasm of uncertain behavior of skin: Secondary | ICD-10-CM | POA: Diagnosis not present

## 2022-05-18 DIAGNOSIS — D2272 Melanocytic nevi of left lower limb, including hip: Secondary | ICD-10-CM | POA: Diagnosis not present

## 2022-05-30 DIAGNOSIS — B351 Tinea unguium: Secondary | ICD-10-CM | POA: Diagnosis not present

## 2022-05-30 DIAGNOSIS — M79674 Pain in right toe(s): Secondary | ICD-10-CM | POA: Diagnosis not present

## 2022-05-30 DIAGNOSIS — M79675 Pain in left toe(s): Secondary | ICD-10-CM | POA: Diagnosis not present

## 2022-05-30 DIAGNOSIS — R239 Unspecified skin changes: Secondary | ICD-10-CM | POA: Diagnosis not present

## 2022-06-05 DIAGNOSIS — Z23 Encounter for immunization: Secondary | ICD-10-CM | POA: Diagnosis not present

## 2022-06-08 ENCOUNTER — Telehealth: Payer: Self-pay | Admitting: *Deleted

## 2022-06-08 NOTE — Chronic Care Management (AMB) (Signed)
  Care Coordination  Outreach Note  06/08/2022 Name: [name] MRN: [id number] DOB: Jul 17, 1946   Care Coordination Outreach Attempts: An unsuccessful telephone outreach was attempted today to offer the patient information about available care coordination services as a benefit of their health plan.   Follow Up Plan:  Additional outreach attempts will be made to offer the patient care coordination information and services.   Encounter Outcome:  No Answer  [place]  Direct Dial: [phone]

## 2022-06-09 DIAGNOSIS — M170 Bilateral primary osteoarthritis of knee: Secondary | ICD-10-CM | POA: Diagnosis not present

## 2022-06-09 NOTE — Chronic Care Management (AMB) (Unsigned)
  Care Coordination  Outreach Note  06/09/2022 Name: ALANYS GODINO MRN: [id number] DOB: July 25, 1946   Care Coordination Outreach Attempts: A second unsuccessful outreach was attempted today to offer the patient with information about available care coordination services as a benefit of their health plan.     Follow Up Plan:  Additional outreach attempts will be made to offer the patient care coordination information and services.   Encounter Outcome:  No Answer  [employer]  Direct Dial: [phone]

## 2022-06-12 NOTE — Chronic Care Management (AMB) (Signed)
  Care Coordination   Note   06/12/2022 Name: [name] MRN: [id number] DOB: [birth year]  TINNA KOLKER is a 76 y.o. year old female who sees [name], MD for primary care. I reached out to [name] by phone today to offer care coordination services.  Ms. Menchaca was given information about Care Coordination services today including:   The Care Coordination services include support from the care team which includes your Nurse Coordinator, Clinical Social Worker, or Pharmacist.  The Care Coordination team is here to help remove barriers to the health concerns and goals most important to you. Care Coordination services are voluntary, and the patient may decline or stop services at any time by request to their care team member.   Care Coordination Consent Status: Patient agreed to services and verbal consent obtained.   Follow up plan:  Telephone appointment with care coordination team member scheduled for:  07/05/22  Encounter Outcome:  Pt. Scheduled  [place]  Direct Dial: [phone]

## 2022-06-28 ENCOUNTER — Other Ambulatory Visit: Payer: Self-pay | Admitting: Physical Medicine & Rehabilitation

## 2022-06-28 ENCOUNTER — Other Ambulatory Visit: Payer: Self-pay | Admitting: Family Medicine

## 2022-06-28 DIAGNOSIS — Z1231 Encounter for screening mammogram for malignant neoplasm of breast: Secondary | ICD-10-CM

## 2022-07-04 ENCOUNTER — Ambulatory Visit: Payer: Medicare Other | Admitting: Hematology and Oncology

## 2022-07-04 ENCOUNTER — Other Ambulatory Visit: Payer: Medicare Other

## 2022-07-04 DIAGNOSIS — M170 Bilateral primary osteoarthritis of knee: Secondary | ICD-10-CM | POA: Diagnosis not present

## 2022-07-05 ENCOUNTER — Ambulatory Visit: Payer: Self-pay

## 2022-07-05 NOTE — Patient Instructions (Signed)
Visit Information  Thank you for taking time to visit with me today. Please don't hesitate to contact me if I can be of assistance to you.   Following are the goals we discussed today:   Goals Addressed               This Visit's Progress     Patient Stated     I need a GI referral for [place] location (pt-stated)        Care Coordination Interventions: Determined patient needs to get established with a GI doctor in [place] nearer to her home, she would like the referral to be sent to Dr. Terressa Koyanagi DO Collaborated with PCP nurse regarding GI referral request          I would like to work on losing weight (pt-stated)        Care Coordination Interventions: Advised patient to discuss with primary care provider options regarding weight management Collaboration with provider for dietician referral and PREP referral  Educated patient about the provider referral exercise program (PREP) Provided verbal and/or written education to patient re: provider recommended life style modifications  Offered to connect patient with psychology or social work support for counseling and supportive care           Our next appointment is by telephone on 07/19/22 at 10:00 AM  Please call the care guide team at [phone] if you need to cancel or reschedule your appointment.   If you are experiencing a Mental Health or [place] or need someone to talk to, please call 1-800-273-TALK (toll free, 24 hour hotline) go to [facility] [address] [place] [phone])  Patient verbalizes understanding of instructions and care plan provided today and agrees to view in [place]. Active MyChart status and patient understanding of how to access instructions and care plan via MyChart confirmed with patient.     Barb Merino, RN, BSN, CCM Care Management Coordinator [facility] Care Management  Direct Phone: [phone]

## 2022-07-05 NOTE — Patient Outreach (Signed)
  Care Coordination   Initial Visit Note   07/05/2022 Name: TIAH HECKEL MRN: [id number] DOB: [birth year]  HAZEL LEVEILLE is a 76 y.o. year old female who sees [name], MD for primary care. I spoke with  [name] by phone today.  What matters to the patients health and wellness today?  Patient would like to work on losing weight.     Goals Addressed               This Visit's Progress     Patient Stated     I need a GI referral for [place] location (pt-stated)        Care Coordination Interventions: Determined patient needs to get established with a GI doctor in [place] nearer to her home, she would like the referral to be sent to Dr. Terressa Koyanagi DO Collaborated with PCP nurse regarding GI referral request          I would like to work on losing weight (pt-stated)        Care Coordination Interventions: Advised patient to discuss with primary care provider options regarding weight management Collaboration with provider for dietician referral and PREP referral  Educated patient about the provider referral exercise program (PREP) Provided verbal and/or written education to patient re: provider recommended life style modifications  Offered to connect patient with psychology or social work support for counseling and supportive care           SDOH assessments and interventions completed:  Yes  SDOH Interventions Today    Flowsheet Row Most Recent Value  SDOH Interventions   Transportation Interventions Intervention Not Indicated        Care Coordination Interventions Activated:  Yes  Care Coordination Interventions:  Yes, provided   Follow up plan: Follow up call scheduled for 07/19/22 '@10AM'$     Encounter Outcome:  Pt. Visit Completed

## 2022-07-07 ENCOUNTER — Other Ambulatory Visit: Payer: Self-pay

## 2022-07-07 ENCOUNTER — Inpatient Hospital Stay: Payer: Medicare Other | Attending: Hematology and Oncology

## 2022-07-07 ENCOUNTER — Inpatient Hospital Stay (HOSPITAL_BASED_OUTPATIENT_CLINIC_OR_DEPARTMENT_OTHER): Payer: Medicare Other | Admitting: Hematology and Oncology

## 2022-07-07 ENCOUNTER — Encounter: Payer: Self-pay | Admitting: Hematology and Oncology

## 2022-07-07 VITALS — BP 131/63 | HR 65 | Temp 97.4°F | Resp 18 | Ht 62.0 in | Wt 183.0 lb

## 2022-07-07 DIAGNOSIS — D72819 Decreased white blood cell count, unspecified: Secondary | ICD-10-CM | POA: Insufficient documentation

## 2022-07-07 DIAGNOSIS — C44729 Squamous cell carcinoma of skin of left lower limb, including hip: Secondary | ICD-10-CM

## 2022-07-07 DIAGNOSIS — Z8572 Personal history of non-Hodgkin lymphomas: Secondary | ICD-10-CM

## 2022-07-07 DIAGNOSIS — Z85828 Personal history of other malignant neoplasm of skin: Secondary | ICD-10-CM | POA: Insufficient documentation

## 2022-07-07 LAB — CBC WITH DIFFERENTIAL/PLATELET
Abs Immature Granulocytes: 0.01 10*3/uL (ref 0.00–0.07)
Basophils Absolute: 0.1 10*3/uL (ref 0.0–0.1)
Basophils Relative: 1 %
Eosinophils Absolute: 0.2 10*3/uL (ref 0.0–0.5)
Eosinophils Relative: 5 %
HCT: 40.3 % (ref 36.0–46.0)
Hemoglobin: 13.6 g/dL (ref 12.0–15.0)
Immature Granulocytes: 0 %
Lymphocytes Relative: 33 %
Lymphs Abs: 1.3 10*3/uL (ref 0.7–4.0)
MCH: 29.2 pg (ref 26.0–34.0)
MCHC: 33.7 g/dL (ref 30.0–36.0)
MCV: 86.7 fL (ref 80.0–100.0)
Monocytes Absolute: 0.3 10*3/uL (ref 0.1–1.0)
Monocytes Relative: 8 %
Neutro Abs: 2 10*3/uL (ref 1.7–7.7)
Neutrophils Relative %: 53 %
Platelets: 187 10*3/uL (ref 150–400)
RBC: 4.65 MIL/uL (ref 3.87–5.11)
RDW: 14.1 % (ref 11.5–15.5)
WBC: 3.9 10*3/uL — ABNORMAL LOW (ref 4.0–10.5)
nRBC: 0 % (ref 0.0–0.2)

## 2022-07-07 LAB — COMPREHENSIVE METABOLIC PANEL
ALT: 29 U/L (ref 0–44)
AST: 30 U/L (ref 15–41)
Albumin: 4.5 g/dL (ref 3.5–5.0)
Alkaline Phosphatase: 79 U/L (ref 38–126)
Anion gap: 6 (ref 5–15)
BUN: 17 mg/dL (ref 8–23)
CO2: 28 mmol/L (ref 22–32)
Calcium: 9.6 mg/dL (ref 8.9–10.3)
Chloride: 106 mmol/L (ref 98–111)
Creatinine, Ser: 1.07 mg/dL — ABNORMAL HIGH (ref 0.44–1.00)
GFR, Estimated: 54 mL/min — ABNORMAL LOW (ref >60)
Glucose, Bld: 96 mg/dL (ref 70–99)
Potassium: 3.9 mmol/L (ref 3.5–5.1)
Sodium: 140 mmol/L (ref 135–145)
Total Bilirubin: 0.8 mg/dL (ref 0.3–1.2)
Total Protein: 6.9 g/dL (ref 6.5–8.1)

## 2022-07-07 NOTE — Assessment & Plan Note (Signed)
She has close follow-up with dermatologist The abnormal changes/discoloration of her left foot is probably not related She will continue follow-up with dermatologist for further management

## 2022-07-07 NOTE — Assessment & Plan Note (Signed)
She has mild chronic intermittent leukopenia, stable Observe for now

## 2022-07-07 NOTE — Assessment & Plan Note (Signed)
Her exam is completely benign The patient is reassured I will see her again next year for further follow-up

## 2022-07-07 NOTE — Progress Notes (Signed)
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   All questions were answered. The patient knows to call the clinic with any problems, questions or concerns. The total time spent in the appointment was 20 minutes encounter with patients including review of chart and various tests results, discussions about plan of care and coordination of care plan   Heath Lark, MD 07/07/2022 11:25 AM  INTERVAL HISTORY: Please see below for problem oriented charting. she returns for surveillance follow-up for history of lymphoma and squamous cell carcinoma of the skin She bring a copy of her pathology report from recent skin biopsy that came back nonmalignant She denies new lymphadenopathy  REVIEW OF SYSTEMS:   Constitutional: Denies fevers, chills or abnormal weight loss Eyes: Denies blurriness of vision Ears, nose, mouth, throat, and face: Denies mucositis or sore throat Respiratory: Denies cough, dyspnea or wheezes Cardiovascular: Denies palpitation, chest discomfort or lower extremity swelling Gastrointestinal:  Denies nausea, heartburn or  change in bowel habits Skin: Denies abnormal skin rashes Lymphatics: Denies new lymphadenopathy or easy bruising Neurological:Denies numbness, tingling or new weaknesses Behavioral/Psych: Mood is stable, no new changes  All other systems were reviewed with the patient and are negative.  I have reviewed the past medical history, past surgical history, social history and family history with the patient and they are unchanged from previous note.  ALLERGIES:  is allergic to latex.  MEDICATIONS:  Current Outpatient Medications  Medication Sig Dispense Refill   acetaminophen (TYLENOL) 500 MG tablet Take 250 mg by mouth every 6 (six) hours as needed for moderate pain.     amlodipine-atorvastatin (CADUET) 10-10 MG tablet      amoxicillin (AMOXIL) 500 MG tablet Take 1 tablet (500 mg total) by mouth 2 (two) times daily. (Patient not taking: Reported on 03/17/2022) 14 tablet 0   aspirin 81 MG tablet Take 81 mg by mouth at bedtime.     atorvastatin (LIPITOR) 20 MG tablet Take 20 mg by mouth every other day. (Patient not taking: Reported on 03/17/2022)     cetirizine (ZYRTEC) 10 MG tablet Take 10 mg by mouth daily.     Cholecalciferol (VITAMIN D) 2000 UNITS tablet Take 2,000 Units by mouth daily.     clobetasol cream (TEMOVATE) 5.59 % Apply 1 application topically 2 (two) times daily.     Cyanocobalamin (VITAMIN B 12 PO) Take 1 tablet by mouth daily.     ezetimibe (ZETIA) 10 MG tablet Take 10 mg by mouth daily.     losartan (COZAAR) 25 MG tablet Take 25 mg by mouth daily.      Multiple Vitamins-Minerals (ONE-A-DAY WOMENS 50 PLUS PO) Take 1 tablet by mouth daily.     Omega-3 Fatty Acids (FISH OIL)  1000 MG CAPS Take by mouth 2 (two) times daily.     pantoprazole (PROTONIX) 20 MG tablet Take 20 mg by mouth as needed.     polyethylene glycol (MIRALAX / GLYCOLAX) packet Take 17 g by mouth daily.     Probiotic Product (PROBIOTIC DAILY PO) Take 1 capsule by mouth daily.     spironolactone (ALDACTONE) 25 MG  tablet Take 25 mg by mouth every morning.     No current facility-administered medications for this visit.    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 Needle biopsy was inconclusive.     08/02/2011 Imaging   CT scan of the chest abdomen and pelvis showed numerous lymphadenopathy above and below the diaphragm.    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 Grade (if applicable): Favor high grade, Ki-67 ranges from 10-50%. Immunohistochemical stains: CD20, CD79a, CD10, BCL-2, BCL-6, CD21, CD3, CD43.   05/09/2013 Procedure   BM biopsy is highly suspicious of involvement. Patient had persistent leukopenia   05/09/2013 Imaging   PET/CT showed significant involvement of LN everywhere   07/16/2013 - 11/05/2013 Chemotherapy   She completed 6 cycles of R. CHOP chemotherapy.   07/23/2013 - 07/27/2013 Hospital Admission   Admitted for management of mucositis and neutropenic fever. Blood culture was positive for gram negative rods, resolved with antibiotics   10/08/2013 Adverse Reaction   The dose of vincristine is reduced by 50%   12/15/2013 Imaging   Repeat PET CT scan  show complete response to treatment.   12/17/2013 - 10/20/2015 Chemotherapy   She begin maintenance rituximab every other month x 2 years   02/23/2014 Procedure   She had shave biopsy of the left upper, that came back well-differentiated squamous cell carcinoma.   04/29/2014 Imaging   CT scan of the abdomen showed no evidence of disease recurrence.   10/12/2014 Imaging   Repeat CT scan of the chest, abdomen and pelvis showed no evidence of recurrence.   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 Left trochanteric bursitis. 2. Abnormal edema in the left quadrate is femoris muscle probably from muscle strain. 3. Axial loss of articular cartilage thickness in both hips with mild associated spurring of the femoral heads. 4. Small amount of nonspecific free pelvic fluid.   10/18/2017 Imaging  1. Stable small scattered supraclavicular, mediastinal and mesenteric lymph nodes but no findings worrisome for recurrent lymphoma. 2. Single small subcutaneous lesion in the lower abdominal area has decreased in size since the prior study. No new lesions. 3. No acute abdominal or pelvic findings. No acute pulmonary findings. 4. Stable age advanced vascular calcifications.   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      PHYSICAL EXAMINATION: ECOG PERFORMANCE STATUS: 1 - Symptomatic but completely ambulatory  Vitals:   07/07/22 0832  BP: 131/63  Pulse: 65  Resp: 18  Temp: (!) 97.4 F (36.3 C)  SpO2: 100%   Filed Weights   07/07/22 0832  Weight: 183 lb (83 kg)    GENERAL:alert, no distress and comfortable SKIN: Noted skin discoloration of her left leg EYES: normal, Conjunctiva are pink and non-injected, sclera clear OROPHARYNX:no exudate, no erythema and lips, buccal mucosa, and tongue normal  NECK: supple, thyroid normal size, non-tender, without nodularity LYMPH:  no palpable lymphadenopathy in the cervical, axillary or inguinal LUNGS: clear to auscultation and percussion with normal breathing effort HEART: regular rate & rhythm and no murmurs and no lower extremity edema ABDOMEN:abdomen soft, non-tender and normal bowel sounds Musculoskeletal:no cyanosis of digits and no clubbing  NEURO: alert & oriented x 3 with fluent speech, no focal motor/sensory deficits  LABORATORY DATA:  I have reviewed the data as listed    Component Value Date/Time   NA 140 07/07/2022 0820   NA 140 01/27/2020 1518   NA 140 01/18/2017 0757   K 3.9 07/07/2022 0820   K 4.1 01/18/2017 0757   CL 106 07/07/2022 0820   CO2 28 07/07/2022 0820   CO2 25 01/18/2017 0757   GLUCOSE 96 07/07/2022 0820   GLUCOSE 94 01/18/2017 0757   BUN 17 07/07/2022 0820   BUN 19 01/27/2020 1518   BUN 14.0 01/18/2017 0757   CREATININE 1.07 (H) 07/07/2022 0820   CREATININE 1.2 (H) 01/18/2017 0757   CALCIUM 9.6 07/07/2022 0820   CALCIUM 9.3 01/18/2017 0757   PROT 6.9 07/07/2022 0820   PROT 7.0 01/18/2017 0757   ALBUMIN 4.5 07/07/2022 0820   ALBUMIN 4.3 01/18/2017 0757   AST 30 07/07/2022 0820   AST 22 01/18/2017 0757   ALT 29 07/07/2022 0820   ALT 19 01/18/2017 0757   ALKPHOS 79 07/07/2022 0820   ALKPHOS 60 01/18/2017 0757   BILITOT 0.8 07/07/2022 0820   BILITOT 1.13 01/18/2017 0757   GFRNONAA 54 (L) 07/07/2022 0820    GFRAA 60 01/27/2020 1518    No results found for: "SPEP", "UPEP"  Lab Results  Component Value Date   WBC 3.9 (L) 07/07/2022   NEUTROABS 2.0 07/07/2022   HGB 13.6 07/07/2022   HCT 40.3 07/07/2022   MCV 86.7 07/07/2022   PLT 187 07/07/2022      Chemistry      Component Value Date/Time   NA 140 07/07/2022 0820   NA 140 01/27/2020 1518   NA 140 01/18/2017 0757   K 3.9 07/07/2022 0820   K 4.1 01/18/2017 0757   CL 106 07/07/2022 0820   CO2 28 07/07/2022 0820   CO2 25 01/18/2017 0757   BUN 17 07/07/2022 0820   BUN 19 01/27/2020 1518  BUN 14.0 01/18/2017 0757   CREATININE 1.07 (H) 07/07/2022 0820   CREATININE 1.2 (H) 01/18/2017 0757      Component Value Date/Time   CALCIUM 9.6 07/07/2022 0820   CALCIUM 9.3 01/18/2017 0757   ALKPHOS 79 07/07/2022 0820   ALKPHOS 60 01/18/2017 0757   AST 30 07/07/2022 0820   AST 22 01/18/2017 0757   ALT 29 07/07/2022 0820   ALT 19 01/18/2017 0757   BILITOT 0.8 07/07/2022 0820   BILITOT 1.13 01/18/2017 0757

## 2022-07-10 NOTE — Progress Notes (Unsigned)
Referring Provider: [name], MD Primary Care Physician:  [name], MD Primary Gastroenterologist:  Dr. Gala Romney  Chief Complaint  Patient presents with   Gastroesophageal Reflux    Was going to Dr. Earlean Shawl, but would like to change to a office closer to home.    HPI:   [name] is a 76 y.o. female presenting today at the request of [name], MD for reflux.   Previously followed by Dr. Earlean Shawl with [facility] in [place]. She has history of reflux. Per their notes, colonoscopy in September 2019. No report on file, but per Dr. Liliane Channel notes, recommended repeat in 2029. History of hemorrhoids s/p banding of RA, LL, and RP hemorrhoid columns in 2020.   Reviewed office visit with PCP dated 05/02/2022.  Patient was presenting for follow-up.  She reported increased frequency of urination and persistent symptoms of reflux.  She is taking Protonix 20 mg as needed.  She was advised to increase Protonix from 20 mg to 40 mg daily and refer to GI for further evaluation.  She also had blood work updated.  Hemoglobin within normal limits, white blood cell count slightly low at 3.3, platelets normal, LFTs normal, creatinine 1.14, electrolytes normal.  Today:  Takes 1-2 pantoprazole about 3 days a week as needed. Will use tums as well. Has reflux every day to some degree. Was concerned about taking pantoprazole daily.  Reports she has tried famotidine in the past, but this was not very helpful.  No specific triggers. Knows weight is contributing. Doesn't drink much caffeine or carbonated beverages. Occasional fried foods. Fruits will cause reflux, but limits these.   Occasional vomiting at night related to reflux. Once every 3 months or so. No dysphagia.   Bowels moving well. No brbpr or melena.   Reports having EGD in the past a few years ago. Maybe with Dr. Earlean Shawl. Can't remember. Last colonoscopy with Dr. Earlean Shawl in 2019.   Planning to see a nutritionist and start at  the [facility] soon to help with weight loss.     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  B-cell, favor follicular NHL.  CD79a, CD20 and CD10 positive.  Cyclin D1 negative.    Fungal infection 08/01/2013   GERD (gastroesophageal reflux disease)    Hemorrhoids 08/05/2013   Hepatic steatosis 10/20/2015   History of B-cell lymphoma 09/25/2011   Core needle biopsy of left kidney mass--lower pole 09/30/11.  B-cell, favor follicular NHL.  CD79a, CD20 and CD10 positive.  Cyclin D1 negative.    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 03/24/2014   Syncope 10/14/2013   Thyroid disease  Past Surgical History:  Procedure Laterality Date   ABDOMINAL HYSTERECTOMY     BONE MARROW ASPIRATION  05/09/2013   BONE MARROW BIOPSY  05/09/2013   BREAST EXCISIONAL BIOPSY Bilateral    x4 1970s   KNEE ARTHROSCOPY W/ DEBRIDEMENT  05/2011   right   LYMPH NODE DISSECTION  x 3 different times   behind left ear 2012/ left clavical area   MR BREAST BILATERAL  cyst removal   TUBAL LIGATION      Current Outpatient Medications  Medication Sig Dispense Refill   acetaminophen (TYLENOL) 500 MG tablet Take 250 mg by mouth every 6 (six) hours as  needed for moderate pain.     amlodipine-atorvastatin (CADUET) 10-10 MG tablet      aspirin 81 MG tablet Take 81 mg by mouth at bedtime.     atorvastatin (LIPITOR) 20 MG tablet Take 20 mg by mouth every other day.     cetirizine (ZYRTEC) 10 MG tablet Take 10 mg by mouth daily.     Cholecalciferol (VITAMIN D) 2000 UNITS tablet Take 2,000 Units by mouth daily.     clobetasol cream (TEMOVATE) 4.09 % Apply 1 application topically 2 (two) times daily.     ezetimibe (ZETIA) 10 MG tablet Take 10 mg by mouth daily.     losartan (COZAAR) 25 MG tablet Take 25 mg by mouth daily.      Multiple Vitamins-Minerals (ONE-A-DAY WOMENS 50 PLUS PO) Take 1 tablet by mouth daily.     Omega-3 Fatty Acids (FISH OIL) 1000 MG CAPS Take by mouth 2 (two) times daily.     pantoprazole (PROTONIX) 20 MG tablet Take 20 mg by mouth as needed.     Probiotic Product (PROBIOTIC DAILY PO) Take 1 capsule by mouth daily.     spironolactone (ALDACTONE) 25 MG tablet Take 25 mg by mouth every morning.     Cyanocobalamin (VITAMIN B 12 PO) Take 1 tablet by mouth daily. (Patient not taking: Reported on 07/11/2022)     No current facility-administered medications for this visit.    Allergies as of 07/11/2022 - Review Complete 07/11/2022  Allergen Reaction Noted   Latex Swelling and Rash     Family History  Problem Relation Age of Onset   Cancer Mother        breast   Stroke Mother    Breast cancer Mother 8   Cancer Father        pancreatic cancer   Heart disease Father    Cancer Brother        kidney cancer/brain tumor   Heart disease Brother    Coronary artery disease Brother    Breast cancer Paternal Aunt    Breast cancer Paternal Aunt     Social History   Socioeconomic History   Marital status: Married    Spouse name: Not on file   Number of children: Not on file   Years of education: Not on file   Highest education level: Not on file  Occupational History   Occupation: [employer]  Tobacco Use   Smoking  status: Never   Smokeless tobacco: Never  Vaping Use   Vaping Use: Never used  Substance and Sexual Activity   Alcohol use: No   Drug use: No   Sexual activity: Never    Birth control/protection: None  Other Topics Concern   Not on file  Social History Narrative   Not on file   Social Determinants of Health   Financial Resource Strain: Not on  file  Food Insecurity: Not on file  Transportation Needs: No Transportation Needs (07/05/2022)   PRAPARE - Hydrologist (Medical): No    Lack of Transportation (Non-Medical): No  Physical Activity: Not on file  Stress: Not on file  Social Connections: Not on file  Intimate Partner Violence: Not on file    Review of Systems: Gen: Denies any fever, chills, cold or flu like symptoms, pre-syncope, or syncope.   CV: Denies chest pain, heart palpitations.  Resp: Denies shortness of breath, cough.  GI: See HPI GU : Denies urinary burning, urinary frequency, urinary hesitancy MS: Denies joint pain. Derm: Denies rash. Psych: Denies depression, anxiety. Heme: See HPI  Physical Exam: BP 130/73 (BP Location: Right Arm, Patient Position: Sitting, Cuff Size: Normal)   Pulse 80   Temp 97.8 F (36.6 C) (Temporal)   Ht _0  (1.575 m)   Wt 183 lb 3.2 oz (83.1 kg)   SpO2 96%   BMI 33.51 kg/m  General:   Alert and oriented. Pleasant and cooperative. Well-nourished and well-developed.  Head:  Normocephalic and atraumatic. Eyes:  Without icterus, sclera clear and conjunctiva pink.  Ears:  Normal auditory acuity. Lungs:  Clear to auscultation bilaterally. No wheezes, rales, or rhonchi. No distress.  Heart:  S1, S2 present without murmurs appreciated.  Abdomen:  +BS, soft, non-tender and non-distended. No HSM noted. No guarding or rebound. No masses appreciated.  Rectal:  Deferred  Msk:  Symmetrical without gross deformities. Normal posture. Extremities:  Without edema. Neurologic:  Alert and  oriented x4;  grossly  normal neurologically. Skin:  Intact without significant lesions or rashes. Psych: Normal mood and affect.    Assessment:  76 year old female with history of B-cell lymphoma currently in remission, HTN, HLD, GERD, hemorrhoids s/p banding x3 in 2020, presenting today to establish care (previously followed by Dr. Earlean Shawl) with chief complaint of GERD.   GERD: Uncontrolled with daily symptoms taking pantoprazole 20-40 mg about 3 days a week and Tums in between.  Pantoprazole does work well when she takes it.  No alarm symptoms.  Tried to watch what she eats. Suspect body habitus is likely contributing and patient reports she plans to see a nutritionist soon and start at the [facility].    Plan:  Start taking pantoprazole 20 mg every day 30 minutes before breakfast. Counseled on GERD diet/lifestyle.  Separate written instructions provided on AVS. Encouraged patient to go through with exercise classes at the [facility] and seeing a nutritionist to help with weight loss.  Follow-up in 3 months via virtual visit per patient's request.  I will request prior colonoscopy/EGD records from Dr. Earlean Shawl to have on file.    Aliene Altes, PA-C [facility] 07/11/2022

## 2022-07-11 ENCOUNTER — Telehealth: Payer: Self-pay | Admitting: Gastroenterology

## 2022-07-11 ENCOUNTER — Ambulatory Visit (INDEPENDENT_AMBULATORY_CARE_PROVIDER_SITE_OTHER): Payer: Medicare Other | Admitting: Gastroenterology

## 2022-07-11 ENCOUNTER — Encounter: Payer: Self-pay | Admitting: Gastroenterology

## 2022-07-11 VITALS — BP 130/73 | HR 80 | Temp 97.8°F | Ht 62.0 in | Wt 183.2 lb

## 2022-07-11 DIAGNOSIS — K219 Gastro-esophageal reflux disease without esophagitis: Secondary | ICD-10-CM | POA: Insufficient documentation

## 2022-07-11 DIAGNOSIS — M170 Bilateral primary osteoarthritis of knee: Secondary | ICD-10-CM | POA: Diagnosis not present

## 2022-07-11 NOTE — Patient Instructions (Signed)
Start taking pantoprazole 20 mg daily 30 minutes before breakfast to control your acid reflux.  Follow a GERD diet:  Avoid fried, fatty, greasy, spicy, citrus foods. Avoid caffeine and carbonated beverages. Avoid chocolate. Try eating 4-6 small meals a day rather than 3 large meals. Do not eat within 3 hours of laying down. Prop head of bed up on wood or bricks to create a 6 inch incline.  We will plan to see back in about 3 months.  Do not hesitate to call sooner if you have questions or concerns.  It was very nice to meet you today!   Aliene Altes, PA-C [facility]

## 2022-07-11 NOTE — Telephone Encounter (Signed)
Manuela Schwartz, can we request prior colonoscopy/EGD records from Dr. Earlean Shawl?

## 2022-07-12 ENCOUNTER — Encounter: Payer: Self-pay | Admitting: Gastroenterology

## 2022-07-12 ENCOUNTER — Telehealth: Payer: Self-pay | Admitting: Gastroenterology

## 2022-07-12 NOTE — Telephone Encounter (Signed)
Received and reviewed EGD and colonoscopy records from Dr. Earlean Shawl.  Colonoscopy 06/06/2018 Indication: Encounter for colon cancer screening. Findings/impression: 7 mm polyp in the sigmoid colon, diverticulosis in the ascending colon, hepatic flexure, transverse colon, splenic flexure, descending colon, sigmoid colon, internal hemorrhoids. Recommendations: Repeat colonoscopy in 5 years of adenoma, 10 years if hyperplastic polyp.  Pathology was not received in this referral; however, it is available in [place].  Pathology result with polypoid benign colonic mucosa with lymphoid aggregate.   EGD 06/06/2018 Indication: Gastroesophageal reflux disease Findings/impression: Normal EGD examination.   No additional recommendations at this time.  We will continue with plans made at her last office visit.

## 2022-07-20 DIAGNOSIS — M170 Bilateral primary osteoarthritis of knee: Secondary | ICD-10-CM | POA: Diagnosis not present

## 2022-07-22 ENCOUNTER — Encounter (INDEPENDENT_AMBULATORY_CARE_PROVIDER_SITE_OTHER): Payer: Self-pay | Admitting: Gastroenterology

## 2022-07-25 ENCOUNTER — Telehealth: Payer: Self-pay | Admitting: *Deleted

## 2022-07-25 NOTE — Progress Notes (Signed)
  Care Coordination Note  07/25/2022 Name: GRACIE GUPTA MRN: [id number] DOB: May 09, 1946  SHEYLI HORWITZ is a 76 y.o. year old female who is a primary care patient of [name], MD and is actively engaged with the care management team. I reached out to [name] by phone today to assist with re-scheduling a follow up visit with the RN Case Manager  Follow up plan: Unsuccessful telephone outreach attempt made. A HIPAA compliant phone message was left for the patient providing contact information and requesting a return call.   [place]  Direct Dial: [phone]

## 2022-08-01 DIAGNOSIS — N390 Urinary tract infection, site not specified: Secondary | ICD-10-CM | POA: Diagnosis not present

## 2022-08-01 DIAGNOSIS — C8239 Follicular lymphoma grade IIIa, extranodal and solid organ sites: Secondary | ICD-10-CM | POA: Diagnosis not present

## 2022-08-01 DIAGNOSIS — K219 Gastro-esophageal reflux disease without esophagitis: Secondary | ICD-10-CM | POA: Diagnosis not present

## 2022-08-01 DIAGNOSIS — E785 Hyperlipidemia, unspecified: Secondary | ICD-10-CM | POA: Diagnosis not present

## 2022-08-01 DIAGNOSIS — I10 Essential (primary) hypertension: Secondary | ICD-10-CM | POA: Diagnosis not present

## 2022-08-01 DIAGNOSIS — Z6833 Body mass index (BMI) 33.0-33.9, adult: Secondary | ICD-10-CM | POA: Diagnosis not present

## 2022-08-01 NOTE — Progress Notes (Signed)
  Care Coordination Note  08/01/2022 Name: SHEILIA REZNICK MRN: [id number] DOB: 1945/12/12  [name] is a 76 y.o. year old female who is a primary care patient of [name], MD and is actively engaged with the care management team. I reached out to [name] by phone today to assist with re-scheduling a follow up visit with the RN Case Manager  Follow up plan: Unsuccessful telephone outreach attempt made. A HIPAA compliant phone message was left for the patient providing contact information and requesting a return call.   [place]  Direct Dial: [phone]

## 2022-08-07 ENCOUNTER — Telehealth: Payer: Self-pay

## 2022-08-07 NOTE — Telephone Encounter (Signed)
Pt is needing refills on pantoprazole 20 mg qty:90. Pt is needing this to be sent to [facility] [place]. Pt was last seen by Kristen on 07/11/2022.

## 2022-08-08 DIAGNOSIS — M79671 Pain in right foot: Secondary | ICD-10-CM | POA: Diagnosis not present

## 2022-08-08 DIAGNOSIS — B351 Tinea unguium: Secondary | ICD-10-CM | POA: Diagnosis not present

## 2022-08-08 MED ORDER — PANTOPRAZOLE SODIUM 20 MG PO TBEC: 20.0000 mg | DELAYED_RELEASE_TABLET | ORAL | 3 refills | Status: DC | PRN

## 2022-08-08 NOTE — Progress Notes (Signed)
  Care Coordination Note  08/08/2022 Name: [name] MRN: [id number] DOB: 01-16-1946  [name] is a 76 y.o. year old female who is a primary care patient of [name], MD and is actively engaged with the care management team. I reached out to [name] by phone today to assist with re-scheduling a follow up visit with the RN Case Manager  Follow up plan: Unsuccessful telephone outreach attempt made. A HIPAA compliant phone message was left for the patient providing contact information and requesting a return call.  We have been unable to make contact with the patient for follow up. The care management team is available to follow up with the patient after provider conversation with the patient regarding recommendation for care management engagement and subsequent re-referral to the care management team.   [place]  Direct Dial: [phone]

## 2022-08-08 NOTE — Progress Notes (Signed)
  Care Coordination Note  08/08/2022 Name: [name] MRN: [id number] DOB: [birth year]  [name] is a 76 y.o. year old female who is a primary care patient of [name], MD and is actively engaged with the care management team. I reached out to [name] by phone today to assist with re-scheduling a follow up visit with the RN Case Manager  Follow up plan: Telephone appointment with care management team member scheduled for:08/18/22  [place]  Direct Dial: [phone]

## 2022-08-08 NOTE — Addendum Note (Signed)
Addended by: Annitta Needs on: 08/08/2022 01:15 PM   Modules accepted: Orders

## 2022-08-08 NOTE — Telephone Encounter (Signed)
Completed.

## 2022-08-15 DIAGNOSIS — M79671 Pain in right foot: Secondary | ICD-10-CM | POA: Diagnosis not present

## 2022-08-15 DIAGNOSIS — L84 Corns and callosities: Secondary | ICD-10-CM | POA: Diagnosis not present

## 2022-08-15 DIAGNOSIS — M79672 Pain in left foot: Secondary | ICD-10-CM | POA: Diagnosis not present

## 2022-08-15 DIAGNOSIS — D485 Neoplasm of uncertain behavior of skin: Secondary | ICD-10-CM | POA: Diagnosis not present

## 2022-08-17 ENCOUNTER — Ambulatory Visit
Admission: RE | Admit: 2022-08-17 | Discharge: 2022-08-17 | Disposition: A | Discharge status: Home / Self Care | Payer: Medicare Other | Source: Ambulatory Visit | Attending: Family Medicine | Admitting: Family Medicine

## 2022-08-17 DIAGNOSIS — Z1231 Encounter for screening mammogram for malignant neoplasm of breast: Secondary | ICD-10-CM

## 2022-08-18 ENCOUNTER — Ambulatory Visit: Payer: Self-pay

## 2022-08-18 NOTE — Patient Outreach (Addendum)
  Care Coordination   Follow Up Visit Note   08/18/2022 Name: SIRINITY OUTLAND MRN: [id number] DOB: 17-Dec-1945  YOUA DELONEY is a 76 y.o. year old female who sees [name], MD for primary care. I spoke with  [name] by phone today.  What matters to the patients health and wellness today?  Patient would like to work on losing weight by participating in the [place] program.     Goals Addressed               This Visit's Progress     Patient Stated     COMPLETED: I need a GI referral for [place] location (pt-stated)        Care Coordination Interventions: Evaluation of current treatment plan related to GERD and patient's adherence to plan as established by provider Determined patient completed her GI follow up with [facility] at [facility], [place] PA-C Review of patient status, including review of consultant's reports, relevant laboratory and other test results, and medications completed Determined patient verbalizes understanding about her treatment plan and disease process           I would like to work on losing weight (pt-stated)        Care Coordination Interventions: Determined patient did not receive a call regarding the PREP program Confirmed patient received and reviewed the educational materials previously sent related to Elderly Nutrition and Getting Started with Losing Weight Sent secure email to Winifred Olive RN requesting she follow up on the PREP referral sent late October, early November as patient is still interested in participating Determined Pam did not receive the referral from PCP Placed outbound call to Dr. Cathey Endow office, unable to complete call due to extended hold times Sent secure email to Lorne Skeens referral coordinator for the [facility] requesting she resend the [employer] referral via email to [email or web address]            SDOH assessments and interventions completed:  No     Care  Coordination Interventions:  Yes, provided   Follow up plan: Follow up call scheduled for 09/06/22 '@1130'$  AM    Encounter Outcome:  Pt. Visit Completed

## 2022-08-18 NOTE — Patient Instructions (Addendum)
Visit Information  Thank you for taking time to visit with me today. Please don't hesitate to contact me if I can be of assistance to you.   Following are the goals we discussed today:   Goals Addressed               This Visit's Progress     Patient Stated     COMPLETED: I need a GI referral for [place] location (pt-stated)        Care Coordination Interventions: Evaluation of current treatment plan related to GERD and patient's adherence to plan as established by provider Determined patient completed her GI follow up with [facility] at [facility], [place] PA-C Review of patient status, including review of consultant's reports, relevant laboratory and other test results, and medications completed Determined patient verbalizes understanding about her treatment plan and disease process           I would like to work on losing weight (pt-stated)        Care Coordination Interventions: Determined patient did not receive a call regarding the PREP program Confirmed patient received and reviewed the educational materials previously sent related to Elderly Nutrition and Getting Started with Losing Weight Sent secure email to Winifred Olive RN requesting she follow up on the PREP referral sent late October, early November as patient is still interested in participating Determined Pam did not receive the referral from PCP Placed outbound call to Dr. Cathey Endow office, unable to complete call due to extended hold times Sent secure email to Lorne Skeens referral coordinator for the [facility] requesting she resend the [employer] referral via email to [email or web address]             Our next appointment is by telephone on 08/07/22 at 1130 AM  Please call the care guide team at [phone] if you need to cancel or reschedule your appointment.   If you are experiencing a Mental Health or [place] or need someone to talk to, please  call 1-800-273-TALK (toll free, 24 hour hotline)  Patient verbalizes understanding of instructions and care plan provided today and agrees to view in [place]. Active MyChart status and patient understanding of how to access instructions and care plan via MyChart confirmed with patient.     [address], RN, BSN, CCM Care Management Coordinator [facility] Care Management Direct Phone: [phone]

## 2022-08-21 DIAGNOSIS — R197 Diarrhea, unspecified: Secondary | ICD-10-CM | POA: Diagnosis not present

## 2022-09-05 DIAGNOSIS — J069 Acute upper respiratory infection, unspecified: Secondary | ICD-10-CM | POA: Diagnosis not present

## 2022-09-05 DIAGNOSIS — N390 Urinary tract infection, site not specified: Secondary | ICD-10-CM | POA: Diagnosis not present

## 2022-09-05 DIAGNOSIS — R197 Diarrhea, unspecified: Secondary | ICD-10-CM | POA: Diagnosis not present

## 2022-09-06 ENCOUNTER — Ambulatory Visit: Payer: Self-pay

## 2022-09-06 NOTE — Patient Outreach (Signed)
  Care Coordination   Follow Up Visit Note   09/06/2022 Name: [name] MRN: [id number] DOB: [birth year]  [name] is a 76 y.o. year old female who sees [name], MD for primary care.  I reviewed patient's chart today in preparation to contact patient for a nurse care coordination follow up call.   What matters to the patients health and wellness today?      Goals Addressed               This Visit's Progress     Patient Stated     I would like to work on losing weight (pt-stated)        Care Coordination Interventions: Goal outcome unknown, unable to collaborate with patient at this time due to PCP request to hold Care Coordination services         SDOH assessments and interventions completed:  No     Care Coordination Interventions:  Yes, provided   Follow up plan: No further intervention required.   Encounter Outcome:  Pt. Visit Completed

## 2022-09-08 DIAGNOSIS — R197 Diarrhea, unspecified: Secondary | ICD-10-CM | POA: Diagnosis not present

## 2022-09-26 DIAGNOSIS — N390 Urinary tract infection, site not specified: Secondary | ICD-10-CM | POA: Diagnosis not present

## 2022-09-26 DIAGNOSIS — B961 Klebsiella pneumoniae [K. pneumoniae] as the cause of diseases classified elsewhere: Secondary | ICD-10-CM | POA: Diagnosis not present

## 2022-10-17 DIAGNOSIS — B351 Tinea unguium: Secondary | ICD-10-CM | POA: Diagnosis not present

## 2022-10-17 DIAGNOSIS — L84 Corns and callosities: Secondary | ICD-10-CM | POA: Diagnosis not present

## 2022-10-17 DIAGNOSIS — M79674 Pain in right toe(s): Secondary | ICD-10-CM | POA: Diagnosis not present

## 2022-10-26 DIAGNOSIS — Z01419 Encounter for gynecological examination (general) (routine) without abnormal findings: Secondary | ICD-10-CM | POA: Diagnosis not present

## 2022-10-26 DIAGNOSIS — Z90710 Acquired absence of both cervix and uterus: Secondary | ICD-10-CM | POA: Diagnosis not present

## 2022-10-26 DIAGNOSIS — R350 Frequency of micturition: Secondary | ICD-10-CM | POA: Diagnosis not present

## 2022-11-01 DIAGNOSIS — H43812 Vitreous degeneration, left eye: Secondary | ICD-10-CM | POA: Diagnosis not present

## 2022-11-01 DIAGNOSIS — H0102B Squamous blepharitis left eye, upper and lower eyelids: Secondary | ICD-10-CM | POA: Diagnosis not present

## 2022-11-01 DIAGNOSIS — Z961 Presence of intraocular lens: Secondary | ICD-10-CM | POA: Diagnosis not present

## 2022-11-01 DIAGNOSIS — H532 Diplopia: Secondary | ICD-10-CM | POA: Diagnosis not present

## 2022-11-01 DIAGNOSIS — H0102A Squamous blepharitis right eye, upper and lower eyelids: Secondary | ICD-10-CM | POA: Diagnosis not present

## 2022-11-01 DIAGNOSIS — H04123 Dry eye syndrome of bilateral lacrimal glands: Secondary | ICD-10-CM | POA: Diagnosis not present

## 2022-11-02 DIAGNOSIS — U071 COVID-19: Secondary | ICD-10-CM | POA: Diagnosis not present

## 2022-11-09 DIAGNOSIS — I10 Essential (primary) hypertension: Secondary | ICD-10-CM | POA: Diagnosis not present

## 2022-11-09 DIAGNOSIS — K219 Gastro-esophageal reflux disease without esophagitis: Secondary | ICD-10-CM | POA: Diagnosis not present

## 2022-11-09 DIAGNOSIS — E785 Hyperlipidemia, unspecified: Secondary | ICD-10-CM | POA: Diagnosis not present

## 2022-12-13 DIAGNOSIS — R059 Cough, unspecified: Secondary | ICD-10-CM | POA: Diagnosis not present

## 2022-12-13 DIAGNOSIS — J302 Other seasonal allergic rhinitis: Secondary | ICD-10-CM | POA: Diagnosis not present

## 2022-12-18 DIAGNOSIS — E7800 Pure hypercholesterolemia, unspecified: Secondary | ICD-10-CM | POA: Diagnosis not present

## 2022-12-18 DIAGNOSIS — C8239 Follicular lymphoma grade IIIa, extranodal and solid organ sites: Secondary | ICD-10-CM | POA: Diagnosis not present

## 2022-12-18 DIAGNOSIS — K219 Gastro-esophageal reflux disease without esophagitis: Secondary | ICD-10-CM | POA: Diagnosis not present

## 2022-12-18 DIAGNOSIS — Z6832 Body mass index (BMI) 32.0-32.9, adult: Secondary | ICD-10-CM | POA: Diagnosis not present

## 2022-12-18 DIAGNOSIS — I10 Essential (primary) hypertension: Secondary | ICD-10-CM | POA: Diagnosis not present

## 2022-12-21 DIAGNOSIS — L858 Other specified epidermal thickening: Secondary | ICD-10-CM | POA: Diagnosis not present

## 2022-12-28 DIAGNOSIS — B351 Tinea unguium: Secondary | ICD-10-CM | POA: Diagnosis not present

## 2022-12-28 DIAGNOSIS — M79674 Pain in right toe(s): Secondary | ICD-10-CM | POA: Diagnosis not present

## 2023-01-08 ENCOUNTER — Other Ambulatory Visit (HOSPITAL_COMMUNITY)
Admission: RE | Admit: 2023-01-08 | Discharge: 2023-01-08 | Disposition: A | Discharge status: Home / Self Care | Payer: Medicare Other | Source: Ambulatory Visit | Attending: Family Medicine | Admitting: Family Medicine

## 2023-01-08 ENCOUNTER — Other Ambulatory Visit: Payer: Self-pay | Admitting: Family Medicine

## 2023-01-08 DIAGNOSIS — N760 Acute vaginitis: Secondary | ICD-10-CM | POA: Diagnosis not present

## 2023-01-08 DIAGNOSIS — Z113 Encounter for screening for infections with a predominantly sexual mode of transmission: Secondary | ICD-10-CM | POA: Insufficient documentation

## 2023-01-08 DIAGNOSIS — Z1151 Encounter for screening for human papillomavirus (HPV): Secondary | ICD-10-CM | POA: Diagnosis not present

## 2023-01-08 DIAGNOSIS — Z124 Encounter for screening for malignant neoplasm of cervix: Secondary | ICD-10-CM | POA: Diagnosis not present

## 2023-01-10 LAB — CYTOLOGY - PAP
Chlamydia: NEGATIVE
Comment: NEGATIVE
Comment: NEGATIVE
Comment: NEGATIVE
Comment: NORMAL
Diagnosis: NEGATIVE
High risk HPV: NEGATIVE
Neisseria Gonorrhea: NEGATIVE
Trichomonas: NEGATIVE

## 2023-01-22 DIAGNOSIS — N760 Acute vaginitis: Secondary | ICD-10-CM | POA: Diagnosis not present

## 2023-03-02 DIAGNOSIS — H93292 Other abnormal auditory perceptions, left ear: Secondary | ICD-10-CM | POA: Diagnosis not present

## 2023-03-02 DIAGNOSIS — J309 Allergic rhinitis, unspecified: Secondary | ICD-10-CM | POA: Diagnosis not present

## 2023-03-02 DIAGNOSIS — R0982 Postnasal drip: Secondary | ICD-10-CM | POA: Diagnosis not present

## 2023-03-08 DIAGNOSIS — B351 Tinea unguium: Secondary | ICD-10-CM | POA: Diagnosis not present

## 2023-03-08 DIAGNOSIS — M79674 Pain in right toe(s): Secondary | ICD-10-CM | POA: Diagnosis not present

## 2023-03-19 DIAGNOSIS — M170 Bilateral primary osteoarthritis of knee: Secondary | ICD-10-CM | POA: Diagnosis not present

## 2023-03-26 DIAGNOSIS — M170 Bilateral primary osteoarthritis of knee: Secondary | ICD-10-CM | POA: Diagnosis not present

## 2023-04-02 DIAGNOSIS — L298 Other pruritus: Secondary | ICD-10-CM | POA: Diagnosis not present

## 2023-04-02 DIAGNOSIS — M170 Bilateral primary osteoarthritis of knee: Secondary | ICD-10-CM | POA: Diagnosis not present

## 2023-04-12 ENCOUNTER — Ambulatory Visit: Payer: Medicare Other | Admitting: Cardiology

## 2023-04-23 DIAGNOSIS — Z6833 Body mass index (BMI) 33.0-33.9, adult: Secondary | ICD-10-CM | POA: Diagnosis not present

## 2023-04-23 DIAGNOSIS — E7800 Pure hypercholesterolemia, unspecified: Secondary | ICD-10-CM | POA: Diagnosis not present

## 2023-04-23 DIAGNOSIS — I10 Essential (primary) hypertension: Secondary | ICD-10-CM | POA: Diagnosis not present

## 2023-04-23 DIAGNOSIS — E785 Hyperlipidemia, unspecified: Secondary | ICD-10-CM | POA: Diagnosis not present

## 2023-04-23 DIAGNOSIS — C8239 Follicular lymphoma grade IIIa, extranodal and solid organ sites: Secondary | ICD-10-CM | POA: Diagnosis not present

## 2023-04-23 DIAGNOSIS — K219 Gastro-esophageal reflux disease without esophagitis: Secondary | ICD-10-CM | POA: Diagnosis not present

## 2023-05-17 DIAGNOSIS — L84 Corns and callosities: Secondary | ICD-10-CM | POA: Diagnosis not present

## 2023-05-17 DIAGNOSIS — B351 Tinea unguium: Secondary | ICD-10-CM | POA: Diagnosis not present

## 2023-05-17 DIAGNOSIS — M79674 Pain in right toe(s): Secondary | ICD-10-CM | POA: Diagnosis not present

## 2023-05-31 DIAGNOSIS — L858 Other specified epidermal thickening: Secondary | ICD-10-CM | POA: Diagnosis not present

## 2023-06-04 DIAGNOSIS — Z23 Encounter for immunization: Secondary | ICD-10-CM | POA: Diagnosis not present

## 2023-06-12 DIAGNOSIS — I872 Venous insufficiency (chronic) (peripheral): Secondary | ICD-10-CM | POA: Diagnosis not present

## 2023-06-20 ENCOUNTER — Other Ambulatory Visit: Payer: Self-pay | Admitting: Family Medicine

## 2023-06-20 DIAGNOSIS — Z1231 Encounter for screening mammogram for malignant neoplasm of breast: Secondary | ICD-10-CM

## 2023-06-25 DIAGNOSIS — I10 Essential (primary) hypertension: Secondary | ICD-10-CM | POA: Diagnosis not present

## 2023-06-25 DIAGNOSIS — E7800 Pure hypercholesterolemia, unspecified: Secondary | ICD-10-CM | POA: Diagnosis not present

## 2023-06-25 DIAGNOSIS — Z6832 Body mass index (BMI) 32.0-32.9, adult: Secondary | ICD-10-CM | POA: Diagnosis not present

## 2023-06-25 DIAGNOSIS — K219 Gastro-esophageal reflux disease without esophagitis: Secondary | ICD-10-CM | POA: Diagnosis not present

## 2023-06-25 DIAGNOSIS — N390 Urinary tract infection, site not specified: Secondary | ICD-10-CM | POA: Diagnosis not present

## 2023-06-25 DIAGNOSIS — C8239 Follicular lymphoma grade IIIa, extranodal and solid organ sites: Secondary | ICD-10-CM | POA: Diagnosis not present

## 2023-06-27 DIAGNOSIS — Z23 Encounter for immunization: Secondary | ICD-10-CM | POA: Diagnosis not present

## 2023-07-05 NOTE — Progress Notes (Signed)
Cardiology Office Note:   Date:  07/06/2023  ID:  Kennedy, Joyce 03-Jan-1946, MRN [id number] PCP: Mirna Mires, MD  [facility] Providers Cardiologist:  Rollene Rotunda, MD {  History of Present Illness:   [name] is a 77 y.o. female who presents for follow up of a coronary anomaly.   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  She has done well.  She is taking care of her husband who has dementia. The patient denies any new symptoms such as chest discomfort, neck or arm discomfort. There has been no new shortness of breath, PND or orthopnea. There have been no reported palpitations, presyncope or syncope.  She walks slowly because of knee problems.  She is not exercising but she goes up and down stairs at times in her home.  She is not having chest pain or shortness of breath with this.   ROS: As stated in the HPI and negative for all other systems.  Studies Reviewed:    EKG:   EKG Interpretation Date/Time:  Friday July 06 2023 08:22:48 EDT Ventricular Rate:  65 PR Interval:  188 QRS Duration:  82 QT Interval:  404 QTC Calculation: 420 R Axis:   9  Text Interpretation: Normal sinus rhythm When compared with ECG of 06-Jul-2023 08:18, Normal EKG Confirmed by Rollene Rotunda ([id number]) on 07/06/2023 8:40:37 AM    Risk Assessment/Calculations:              Physical Exam:   VS:  BP 128/84 (BP Location: Left Arm, Patient Position: Sitting, Cuff Size: Normal)   Pulse 67   Ht 5\' 2"  (1.575 m)   Wt 176 lb 3.2 oz (79.9 kg)   SpO2 97%   BMI 32.23 kg/m    Wt Readings from Last 3 Encounters:  07/06/23 176 lb 3.2 oz (79.9 kg)  07/11/22 183 lb 3.2 oz (83.1 kg)  07/07/22 183 lb (83 kg)     GEN: Well nourished, well developed in no acute distress NECK: No JVD; No carotid bruits CARDIAC: RRR, no murmurs, rubs, gallops RESPIRATORY:  Clear to  auscultation without rales, wheezing or rhonchi  ABDOMEN: Soft, non-tender, non-distended EXTREMITIES:  No edema; No deformity   ASSESSMENT AND PLAN:   CORONARY ARTERY ANOMALY, CONGENITAL The patient has no symptoms.  No change in therapy or imaging is indicated.  ESSENTIAL HYPERTENSION, BENIGN The blood pressure is not at target sometimes at home.  She is going to keep a blood pressure diary and I will review this and make any changes.  She thinks it might be creeping up in the afternoon but she is not clear on this.   HYPERLIPIDEMIA Her LDL was 70.  No change in therapy.    EDEMA:     She wears compression stockings.  This is not a problem any longer.  No change in therapy.  This is mild at the end of the day.  No change in therapy.       Follow up me in about 12 months  Signed, Rollene Rotunda, MD

## 2023-07-06 ENCOUNTER — Ambulatory Visit: Payer: Medicare Other | Attending: Cardiology | Admitting: Cardiology

## 2023-07-06 ENCOUNTER — Ambulatory Visit: Payer: Medicare Other | Admitting: Cardiology

## 2023-07-06 ENCOUNTER — Encounter: Payer: Self-pay | Admitting: Cardiology

## 2023-07-06 VITALS — BP 128/84 | HR 67 | Ht 62.0 in | Wt 176.2 lb

## 2023-07-06 DIAGNOSIS — I10 Essential (primary) hypertension: Secondary | ICD-10-CM | POA: Diagnosis not present

## 2023-07-06 DIAGNOSIS — M7989 Other specified soft tissue disorders: Secondary | ICD-10-CM | POA: Diagnosis not present

## 2023-07-06 DIAGNOSIS — E785 Hyperlipidemia, unspecified: Secondary | ICD-10-CM | POA: Insufficient documentation

## 2023-07-06 DIAGNOSIS — Q245 Malformation of coronary vessels: Secondary | ICD-10-CM | POA: Insufficient documentation

## 2023-07-06 NOTE — Patient Instructions (Signed)
  Follow-Up: At [facility], you and your health needs are our priority.  As part of our continuing mission to provide you with exceptional heart care, we have created designated Provider Care Teams.  These Care Teams include your primary Cardiologist (physician) and Advanced Practice Providers (APPs -  Physician Assistants and Nurse Practitioners) who all work together to provide you with the care you need, when you need it.  We recommend signing up for the patient portal called "MyChart".  Sign up information is provided on this After Visit Summary.  MyChart is used to connect with patients for Virtual Visits (Telemedicine).  Patients are able to view lab/test results, encounter notes, upcoming appointments, etc.  Non-urgent messages can be sent to your provider as well.   To learn more about what you can do with MyChart, go to [email or web address].    Your next appointment:   12 month(s)  Provider:   Rollene Rotunda, MD     Other Instructions:  Record daily blood pressure readings on a daily basis on blood pressure log provided by nurse.

## 2023-07-10 ENCOUNTER — Encounter: Payer: Self-pay | Admitting: Hematology and Oncology

## 2023-07-10 ENCOUNTER — Inpatient Hospital Stay (HOSPITAL_BASED_OUTPATIENT_CLINIC_OR_DEPARTMENT_OTHER): Payer: Medicare Other | Admitting: Hematology and Oncology

## 2023-07-10 ENCOUNTER — Inpatient Hospital Stay: Payer: Medicare Other | Attending: Hematology and Oncology

## 2023-07-10 VITALS — BP 160/78 | HR 70 | Temp 97.9°F | Resp 18 | Ht 62.0 in | Wt 180.0 lb

## 2023-07-10 DIAGNOSIS — C44729 Squamous cell carcinoma of skin of left lower limb, including hip: Secondary | ICD-10-CM

## 2023-07-10 DIAGNOSIS — Z79899 Other long term (current) drug therapy: Secondary | ICD-10-CM | POA: Insufficient documentation

## 2023-07-10 DIAGNOSIS — M7062 Trochanteric bursitis, left hip: Secondary | ICD-10-CM | POA: Diagnosis not present

## 2023-07-10 DIAGNOSIS — M4316 Spondylolisthesis, lumbar region: Secondary | ICD-10-CM | POA: Insufficient documentation

## 2023-07-10 DIAGNOSIS — M47816 Spondylosis without myelopathy or radiculopathy, lumbar region: Secondary | ICD-10-CM | POA: Diagnosis not present

## 2023-07-10 DIAGNOSIS — Z8572 Personal history of non-Hodgkin lymphomas: Secondary | ICD-10-CM | POA: Diagnosis present

## 2023-07-10 DIAGNOSIS — M48061 Spinal stenosis, lumbar region without neurogenic claudication: Secondary | ICD-10-CM | POA: Insufficient documentation

## 2023-07-10 DIAGNOSIS — I10 Essential (primary) hypertension: Secondary | ICD-10-CM | POA: Diagnosis not present

## 2023-07-10 LAB — CBC WITH DIFFERENTIAL/PLATELET
Abs Immature Granulocytes: 0.01 10*3/uL (ref 0.00–0.07)
Basophils Absolute: 0.1 10*3/uL (ref 0.0–0.1)
Basophils Relative: 2 %
Eosinophils Absolute: 0.2 10*3/uL (ref 0.0–0.5)
Eosinophils Relative: 5 %
HCT: 37.2 % (ref 36.0–46.0)
Hemoglobin: 12.5 g/dL (ref 12.0–15.0)
Immature Granulocytes: 0 %
Lymphocytes Relative: 38 %
Lymphs Abs: 1.2 10*3/uL (ref 0.7–4.0)
MCH: 29.5 pg (ref 26.0–34.0)
MCHC: 33.6 g/dL (ref 30.0–36.0)
MCV: 87.7 fL (ref 80.0–100.0)
Monocytes Absolute: 0.3 10*3/uL (ref 0.1–1.0)
Monocytes Relative: 10 %
Neutro Abs: 1.5 10*3/uL — ABNORMAL LOW (ref 1.7–7.7)
Neutrophils Relative %: 45 %
Platelets: 190 10*3/uL (ref 150–400)
RBC: 4.24 MIL/uL (ref 3.87–5.11)
RDW: 13.8 % (ref 11.5–15.5)
WBC: 3.3 10*3/uL — ABNORMAL LOW (ref 4.0–10.5)
nRBC: 0 % (ref 0.0–0.2)

## 2023-07-10 LAB — COMPREHENSIVE METABOLIC PANEL
ALT: 19 U/L (ref 0–44)
AST: 26 U/L (ref 15–41)
Albumin: 4.3 g/dL (ref 3.5–5.0)
Alkaline Phosphatase: 71 U/L (ref 38–126)
Anion gap: 6 (ref 5–15)
BUN: 17 mg/dL (ref 8–23)
CO2: 27 mmol/L (ref 22–32)
Calcium: 9.1 mg/dL (ref 8.9–10.3)
Chloride: 109 mmol/L (ref 98–111)
Creatinine, Ser: 1.02 mg/dL — ABNORMAL HIGH (ref 0.44–1.00)
GFR, Estimated: 57 mL/min — ABNORMAL LOW (ref >60)
Glucose, Bld: 95 mg/dL (ref 70–99)
Potassium: 3.9 mmol/L (ref 3.5–5.1)
Sodium: 142 mmol/L (ref 135–145)
Total Bilirubin: 0.8 mg/dL (ref 0.3–1.2)
Total Protein: 6.2 g/dL — ABNORMAL LOW (ref 6.5–8.1)

## 2023-07-10 NOTE — Progress Notes (Signed)
[facility] OFFICE PROGRESS NOTE  Patient Care Team: Mirna Mires, MD as PCP - General (Family Medicine) Rollene Rotunda, MD as PCP - Cardiology (Cardiology) Artis Delay, MD as Consulting Physician (Hematology and Oncology) Clarene Duke Karma Lew, RN as [facility] Care Management Rourk, Gerrit Friends, MD as Consulting Physician (Gastroenterology)  HISTORY OF PRESENTING ILLNESS: Discussed the use of AI scribe software for clinical note transcription with the patient, who gave verbal consent to proceed.  History of Present Illness   The patient, with a history of lymphoma and skin cancer on the legs is seen for surveillance visit. She had a recent self-resolved infection for which she was on antibiotics, has been maintaining an active lifestyle as much as possible. She has been adhering to a medication regimen, although she has stopped taking vitamin B12 due to the number of other medications she is on. She has not noticed any abnormal lymph nodes or swelling.  The patient has been trying to maintain a healthy lifestyle, including a stretching exercise routine about three times a week. She has lost three pounds in the past year. However, she has been experiencing high blood pressure, which she attributes to stress from running late due to traffic.  The patient also reports persistent bilateral leg swelling, for which she is using compression to alleviate some of the pressure. Despite these health challenges, the patient's blood count has remained stable over the past five years.         Assessment and Plan    History of lymphoma No evidence of disease   Hypertension Elevated blood pressure noted today, but patient reports normal readings at recent primary care visit. Possible stress-related elevation today due to traffic and running late. -Continue current management and monitor blood pressure.  Skin Cancer (legs) No reported changes in skin lesions. Recent dermatology  evaluation reported as normal. -Continue current management and regular dermatology follow-ups.  Weight Management Patient has lost 3 pounds since last year. Encouraged further weight loss through continued exercise and healthy diet. -Continue exercise regimen and healthy diet. Aim for further weight loss.  General Health Maintenance Up to date with flu vaccination and mammogram. Not currently taking Vitamin B12. -Continue routine health maintenance. Consider resumption of Vitamin B12 supplementation.  Follow-up in 1 year.          No orders of the defined types were placed in this encounter.   All questions were answered. The patient knows to call the clinic with any problems, questions or concerns. The total time spent in the appointment was 20 minutes encounter with patients including review of chart and various tests results, discussions about plan of care and coordination of care plan   Artis Delay, MD 07/10/2023 10:33 AM  REVIEW OF SYSTEMS:  All other systems were reviewed with the patient and are negative.  I have reviewed the past medical history, past surgical history, social history and family history with the patient and they are unchanged from previous note.  ALLERGIES:  is allergic to latex.  MEDICATIONS:  Current Outpatient Medications  Medication Sig Dispense Refill   acetaminophen (TYLENOL) 500 MG tablet Take 250 mg by mouth every 6 (six) hours as needed for moderate pain.     amlodipine-atorvastatin (CADUET) 10-10 MG tablet      aspirin 81 MG tablet Take 81 mg by mouth at bedtime.     atorvastatin (LIPITOR) 20 MG tablet Take 20 mg by mouth every other day.     Calcium Magnesium Zinc  333-133-5 MG TABS Take 5 mg by mouth daily.     cetirizine (ZYRTEC) 10 MG tablet Take 10 mg by mouth daily.     Cholecalciferol (VITAMIN D) 2000 UNITS tablet Take 2,000 Units by mouth daily.     clobetasol cream (TEMOVATE) 0.05 % Apply 1 application topically 2 (two) times daily.      ezetimibe (ZETIA) 10 MG tablet Take 10 mg by mouth daily.     losartan (COZAAR) 25 MG tablet Take 25 mg by mouth daily.      Multiple Vitamins-Minerals (ONE-A-DAY WOMENS 50 PLUS PO) Take 1 tablet by mouth daily.     Omega-3 Fatty Acids (FISH OIL) 1000 MG CAPS Take by mouth 2 (two) times daily.     pantoprazole (PROTONIX) 20 MG tablet Take 1 tablet (20 mg total) by mouth as needed. 90 tablet 3   Probiotic Product (PROBIOTIC DAILY PO) Take 1 capsule by mouth daily.     spironolactone (ALDACTONE) 25 MG tablet Take 25 mg by mouth every morning.     No current facility-administered medications for this visit.    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 Needle biopsy was inconclusive.     08/02/2011 Imaging   CT scan of the chest abdomen and pelvis showed numerous lymphadenopathy above and below the diaphragm.    08/16/2011 Imaging   MRI of the abdomen revealed enlarging left kidney lesion worrisome for malignancy   09/20/2011 Initial Diagnosis   Lymphoma, low grade from kidney biopsy, follicular   03/28/2013 Procedure   Histologic type: Non-Hodgkin's lymphoma, follicular center cell type. Grade (if applicable): Favor high grade, Ki-67 ranges from 10-50%. Immunohistochemical stains: CD20, CD79a, CD10, BCL-2, BCL-6, CD21, CD3, CD43.   05/09/2013 Procedure   BM biopsy is highly suspicious of  involvement. Patient had persistent leukopenia   05/09/2013 Imaging   PET/CT showed significant involvement of LN everywhere   07/16/2013 - 11/05/2013 Chemotherapy   She completed 6 cycles of R. CHOP chemotherapy.   07/23/2013 - 07/27/2013 Hospital Admission   Admitted for management of mucositis and neutropenic fever. Blood culture was positive for gram negative rods, resolved with antibiotics   10/08/2013 Adverse Reaction   The dose of vincristine is reduced by 50%   12/15/2013 Imaging   Repeat PET CT scan show complete response to treatment.   12/17/2013 - 10/20/2015 Chemotherapy   She begin maintenance rituximab every other month x 2 years   02/23/2014 Procedure   She had shave biopsy of the left upper, that came back well-differentiated squamous cell carcinoma.   04/29/2014 Imaging   CT scan of the abdomen showed no evidence of disease recurrence.   10/12/2014 Imaging   Repeat CT scan of the chest, abdomen and pelvis showed no evidence of recurrence.   10/19/2015 Imaging   Ct scan showed no evidence of lymphoma   06/08/2017 Imaging  MRI lumbar Transitional lumbosacral anatomy.  L5 incorporated into the sacrum   Mild degenerative change L2-3 without stenosis   Mild anterolisthesis L3-4 with facet degeneration and subarticular stenosis bilaterally   4 mm anterolisthesis L4-5 with facet degeneration and mild subarticular stenosis bilaterally   06/08/2017 Imaging   MRI left hip 1. Left trochanteric bursitis. 2. Abnormal edema in the left quadrate is femoris muscle probably from muscle strain. 3. Axial loss of articular cartilage thickness in both hips with mild associated spurring of the femoral heads. 4. Small amount of nonspecific free pelvic fluid.   10/18/2017 Imaging   1. Stable small scattered supraclavicular, mediastinal and mesenteric lymph nodes but no findings worrisome for recurrent lymphoma. 2. Single small subcutaneous lesion in the lower abdominal area has decreased in  size since the prior study. No new lesions. 3. No acute abdominal or pelvic findings. No acute pulmonary findings. 4. Stable age advanced vascular calcifications.   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     PHYSICAL EXAMINATION: ECOG PERFORMANCE STATUS: 0 - Asymptomatic  Vitals:   07/10/23 0826  BP: (!) 160/78  Pulse: 70  Resp: 18  Temp: 97.9 F (36.6 C)  SpO2: 99%   Filed Weights   07/10/23 0826  Weight: 180 lb (81.6 kg)    GENERAL:alert, no distress and comfortable SKIN: skin color, texture, turgor are normal, no rashes or significant lesions EYES: normal, conjunctiva are pink and non-injected, sclera clear OROPHARYNX:no exudate, no erythema and lips, buccal mucosa, and tongue normal  NECK: supple, thyroid normal size, non-tender, without nodularity LYMPH:  no palpable lymphadenopathy in the cervical, axillary or inguinal LUNGS: clear to auscultation and percussion with normal breathing effort HEART: regular rate & rhythm and no murmurs noted mild lower extremity edema ABDOMEN:abdomen soft, non-tender and normal bowel sounds Musculoskeletal:no cyanosis of digits and no clubbing  PSYCH: alert & oriented x 3 with fluent speech NEURO: no focal motor/sensory deficits  LABORATORY DATA:  I have reviewed the data as listed    Component Value Date/Time   NA 142 07/10/2023 0746   NA 140 01/27/2020 1518   NA 140 01/18/2017 0757   K 3.9 07/10/2023 0746   K 4.1 01/18/2017 0757   CL 109 07/10/2023 0746   CO2 27 07/10/2023 0746   CO2 25 01/18/2017 0757   GLUCOSE 95 07/10/2023 0746   GLUCOSE 94 01/18/2017 0757   BUN 17 07/10/2023 0746   BUN 19 01/27/2020 1518   BUN 14.0  01/18/2017 0757   CREATININE 1.02 (H) 07/10/2023 0746   CREATININE 1.2 (H) 01/18/2017 0757   CALCIUM 9.1 07/10/2023 0746   CALCIUM 9.3 01/18/2017 0757   PROT 6.2 (L) 07/10/2023 0746   PROT 7.0 01/18/2017 0757   ALBUMIN 4.3 07/10/2023 0746   ALBUMIN 4.3 01/18/2017 0757   AST 26 07/10/2023 0746   AST 22 01/18/2017 0757   ALT 19 07/10/2023 0746   ALT 19 01/18/2017 0757   ALKPHOS 71 07/10/2023 0746   ALKPHOS 60 01/18/2017 0757   BILITOT 0.8 07/10/2023 0746   BILITOT 1.13 01/18/2017 0757   GFRNONAA 57 (L) 07/10/2023 0746   GFRAA 60 01/27/2020 1518    No results found for: "SPEP", "UPEP"  Lab Results  Component Value  Date   WBC 3.3 (L) 07/10/2023   NEUTROABS 1.5 (L) 07/10/2023   HGB 12.5 07/10/2023   HCT 37.2 07/10/2023   MCV 87.7 07/10/2023   PLT 190 07/10/2023      Chemistry      Component Value Date/Time   NA 142 07/10/2023 0746   NA 140 01/27/2020 1518   NA 140 01/18/2017 0757   K 3.9 07/10/2023 0746   K 4.1 01/18/2017 0757   CL 109 07/10/2023 0746   CO2 27 07/10/2023 0746   CO2 25 01/18/2017 0757   BUN 17 07/10/2023 0746   BUN 19 01/27/2020 1518   BUN 14.0 01/18/2017 0757   CREATININE 1.02 (H) 07/10/2023 0746   CREATININE 1.2 (H) 01/18/2017 0757      Component Value Date/Time   CALCIUM 9.1 07/10/2023 0746   CALCIUM 9.3 01/18/2017 0757   ALKPHOS 71 07/10/2023 0746   ALKPHOS 60 01/18/2017 0757   AST 26 07/10/2023 0746   AST 22 01/18/2017 0757   ALT 19 07/10/2023 0746   ALT 19 01/18/2017 0757   BILITOT 0.8 07/10/2023 0746   BILITOT 1.13 01/18/2017 0757

## 2023-07-17 NOTE — Progress Notes (Signed)
Order(s) created erroneously. Erroneous order ID: [id number]  Order moved by: CHART CORRECTION ANALYST, FIFTEEN  Order move date/time: 07/17/2023 2:22 PM  Source Patient: [id number]  Source Contact: 07/06/2023  Destination Patient: [id number]  Destination Contact: 02/21/2023

## 2023-07-26 DIAGNOSIS — M79674 Pain in right toe(s): Secondary | ICD-10-CM | POA: Diagnosis not present

## 2023-07-26 DIAGNOSIS — B351 Tinea unguium: Secondary | ICD-10-CM | POA: Diagnosis not present

## 2023-07-26 DIAGNOSIS — M79675 Pain in left toe(s): Secondary | ICD-10-CM | POA: Diagnosis not present

## 2023-07-26 DIAGNOSIS — R600 Localized edema: Secondary | ICD-10-CM | POA: Diagnosis not present

## 2023-08-03 ENCOUNTER — Other Ambulatory Visit: Payer: Self-pay

## 2023-08-03 DIAGNOSIS — M7989 Other specified soft tissue disorders: Secondary | ICD-10-CM

## 2023-08-08 ENCOUNTER — Telehealth: Payer: Self-pay | Admitting: *Deleted

## 2023-08-08 MED ORDER — LOSARTAN POTASSIUM 50 MG PO TABS: 50.0000 mg | ORAL_TABLET | Freq: Every day | ORAL | 3 refills | Status: DC

## 2023-08-08 NOTE — Telephone Encounter (Signed)
Spoke with pt, she had mailed her blood pressure log to the office and Dr Antoine Poche has reviewed those numbers.  They ranged from 113-148/64-87. Patient instructed to increase losartan to 50 mg once daily. New script sent to the pharmacy

## 2023-08-17 ENCOUNTER — Ambulatory Visit (HOSPITAL_COMMUNITY)
Admission: RE | Admit: 2023-08-17 | Discharge: 2023-08-17 | Disposition: A | Discharge status: Home / Self Care | Payer: Medicare Other | Source: Ambulatory Visit | Attending: Vascular Surgery

## 2023-08-17 DIAGNOSIS — M7989 Other specified soft tissue disorders: Secondary | ICD-10-CM | POA: Diagnosis not present

## 2023-08-20 ENCOUNTER — Ambulatory Visit: Payer: Medicare Other

## 2023-08-27 DIAGNOSIS — Z0000 Encounter for general adult medical examination without abnormal findings: Secondary | ICD-10-CM | POA: Diagnosis not present

## 2023-08-27 DIAGNOSIS — R069 Unspecified abnormalities of breathing: Secondary | ICD-10-CM | POA: Diagnosis not present

## 2023-08-27 DIAGNOSIS — J069 Acute upper respiratory infection, unspecified: Secondary | ICD-10-CM | POA: Diagnosis not present

## 2023-08-27 DIAGNOSIS — I10 Essential (primary) hypertension: Secondary | ICD-10-CM | POA: Diagnosis not present

## 2023-09-13 ENCOUNTER — Ambulatory Visit: Payer: Medicare Other

## 2023-10-04 ENCOUNTER — Encounter: Payer: Self-pay | Admitting: Physician Assistant

## 2023-10-04 ENCOUNTER — Ambulatory Visit: Payer: Medicare Other

## 2023-10-04 VITALS — BP 131/83 | HR 72 | Temp 97.9°F | Resp 20 | Ht 62.0 in | Wt 171.0 lb

## 2023-10-04 DIAGNOSIS — I872 Venous insufficiency (chronic) (peripheral): Secondary | ICD-10-CM | POA: Diagnosis not present

## 2023-10-04 NOTE — Progress Notes (Signed)
[facility] [place]   Reason for referral: Swollen left & R legs  History of Present Illness  [name] is a 78 y.o. female who presents with chief complaint: swollen leg.  Patient notes, onset of swelling  years ago, associated with prolonged sitting and standing.  The patient has had no history of DVT, no history of varicose vein, no history of venous stasis ulcers, no history of  Lymphedema and no  history of skin changes in lower legs.  There is unknown family history of venous disorders.  The patient has  used thigh high compression stockings in the past.  She noticed increased swelling around 05/2023 when she was with her husband while in the hospital long term.  He has since pass away.  She states she stays very active and takes care of her own house hold.  She denies rest pain, non healing wounds or claudication.    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  B-cell, favor follicular NHL.  CD79a, CD20 and CD10 positive.  Cyclin D1 negative.    Fungal infection 08/01/2013   GERD (gastroesophageal reflux disease)    Hemorrhoids 08/05/2013   Hepatic steatosis 10/20/2015   History of B-cell lymphoma 09/25/2011   Core needle biopsy of left kidney mass--lower pole 09/30/11.  B-cell, favor follicular NHL.  CD79a, CD20 and CD10 positive.  Cyclin D1 negative.    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 Kinnie Scales; 7 mm benign polyp removed from the splenic flexure, pancolonic diverticulosis, internal hemorrhoids.  Recommended 10-year repeat.   ESOPHAGOGASTRODUODENOSCOPY  06/06/2018   Dr. Kinnie Scales; normal exam.   KNEE ARTHROSCOPY W/ DEBRIDEMENT  05/2011   right   LYMPH NODE DISSECTION  x 3 different times   behind left ear 2012/ left clavical area   MR BREAST BILATERAL  cyst removal   TUBAL LIGATION      Social History   Socioeconomic History   Marital status: Married    Spouse name: Not on file   Number of children: Not on file   Years of education: Not on file   Highest education level: Not on file  Occupational History   Occupation: [employer]  Tobacco Use   Smoking status: Never   Smokeless tobacco: Never  Vaping Use   Vaping status:  Never Used  Substance and Sexual Activity   Alcohol use: No   Drug use: No   Sexual activity: Never    Birth control/protection: None  Other Topics Concern   Not on file  Social History Narrative   Not on file   Social Drivers of Health   Financial Resource Strain: Not on file  Food Insecurity: Not on file  Transportation Needs: No Transportation Needs (07/05/2022)   PRAPARE - Administrator, Civil Service (Medical): No    Lack of Transportation (Non-Medical): No  Physical Activity: Not on file  Stress: Not on file  Social Connections: Not on file  Intimate Partner Violence: Not on file    Family History  Problem Relation Age of  Onset   Cancer Mother        breast   Stroke Mother    Breast cancer Mother 5   Cancer Father        pancreatic cancer   Heart disease Father    Cancer Brother        kidney cancer/brain tumor   Heart disease Brother    Coronary artery disease Brother    Breast cancer Paternal Aunt    Breast cancer Paternal Aunt     Current Outpatient Medications on File Prior to Visit  Medication Sig Dispense Refill   acetaminophen (TYLENOL) 500 MG tablet Take 250 mg by mouth every 6 (six) hours as needed for moderate pain.     amlodipine-atorvastatin (CADUET) 10-10 MG tablet      aspirin 81 MG tablet Take 81 mg by mouth at bedtime.     atorvastatin (LIPITOR) 20 MG tablet Take 20 mg by mouth every other day.     Calcium Magnesium Zinc 333-133-5 MG TABS Take 5 mg by mouth daily.     cetirizine (ZYRTEC) 10 MG tablet Take 10 mg by mouth daily.     Cholecalciferol (VITAMIN D) 2000 UNITS tablet Take 2,000 Units by mouth daily.     clobetasol cream (TEMOVATE) 0.05 % Apply 1 application topically 2 (two) times daily.     ezetimibe (ZETIA) 10 MG tablet Take 10 mg by mouth daily.     losartan (COZAAR) 50 MG tablet Take 1 tablet (50 mg total) by mouth daily. 90 tablet 3   Multiple Vitamins-Minerals (ONE-A-DAY WOMENS 50 PLUS PO) Take 1 tablet by mouth daily.     Omega-3 Fatty Acids (FISH OIL) 1000 MG CAPS Take by mouth 2 (two) times daily.     pantoprazole (PROTONIX) 20 MG tablet Take 1 tablet (20 mg total) by mouth as needed. 90 tablet 3   Probiotic Product (PROBIOTIC DAILY PO) Take 1 capsule by mouth daily.     spironolactone (ALDACTONE) 25 MG tablet Take 25 mg by mouth every morning.     No current facility-administered medications on file prior to visit.    Allergies as of 10/04/2023 - Review Complete 10/04/2023  Allergen Reaction Noted   Latex Swelling and Rash      ROS:   General:  No weight loss, Fever, chills  HEENT: No recent headaches, no nasal bleeding, no visual changes, no sore  throat  Neurologic: No dizziness, blackouts, seizures. No recent symptoms of stroke or mini- stroke. No recent episodes of slurred speech, or temporary blindness.  Cardiac: No recent episodes of chest pain/pressure, no shortness of breath at rest.  No shortness of breath with exertion.  Denies history of atrial fibrillation or irregular heartbeat  Vascular: No history of rest  pain in feet.  No history of claudication.  No history of non-healing ulcer, No history of DVT   Pulmonary: No home oxygen, no productive cough, no hemoptysis,  No asthma or wheezing  Musculoskeletal:  [ x] Arthritis, [ ]  Low back pain,  [ ]  Joint pain  Hematologic:No history of hypercoagulable state.  No history of easy bleeding.  No history of anemia  Gastrointestinal: No hematochezia or melena,  No gastroesophageal reflux, no trouble swallowing  Urinary: [ ]  chronic Kidney disease, [ ]  on HD - [ ]  MWF or [ ]  TTHS, [ ]  Burning with urination, [ ]  Frequent urination, [ ]  Difficulty urinating;   Skin: No rashes  Psychological: No history of anxiety,  No history of depression  Physical Examination  Vitals:   10/04/23 1031  BP: 131/83  Pulse: 72  Resp: 20  Temp: 97.9 F (36.6 C)  SpO2: 96%  Weight: 171 lb (77.6 kg)  Height: 5\' 2"  (1.575 m)    Body mass index is 31.28 kg/m.  General:  Alert and oriented, no acute distress HEENT: Normal Neck: No bruit or JVD Pulmonary: Clear to auscultation bilaterally Cardiac: Regular Rate and Rhythm without murmur Abdomen: Soft, non-tender, non-distended, no mass, no scars Skin: No rash Extremity Pulses:   radial,  femoral, dorsalis pedis,  pulses bilaterally Musculoskeletal: No deformity or edema  Neurologic: Upper and lower extremity motor 5/5 and symmetric  DATA: +--------------+---------+------+-----------+------------+--------+  LEFT         Reflux NoRefluxReflux TimeDiameter cmsComments                          Yes                                    +--------------+---------+------+-----------+------------+--------+  CFV                    yes   >1 second                       +--------------+---------+------+-----------+------------+--------+  FV prox                 yes   >1 second                       +--------------+---------+------+-----------+------------+--------+  FV mid                  yes   >1 second                       +--------------+---------+------+-----------+------------+--------+  FV dist                 yes   >1 second                       +--------------+---------+------+-----------+------------+--------+  Popliteal              yes   >1 second                       +--------------+---------+------+-----------+------------+--------+  GSV at SFJ    no                           0.637              +--------------+---------+------+-----------+------------+--------+  GSV prox thighno                           0.403              +--------------+---------+------+-----------+------------+--------+  GSV mid thigh no                           0.409              +--------------+---------+------+-----------+------------+--------+  GSV dist thighno                           0.308              +--------------+---------+------+-----------+------------+--------+  GSV at knee   no                           0.251              +--------------+---------+------+-----------+------------+--------+  GSV prox calf           yes    >500 ms     0.274              +--------------+---------+------+-----------+------------+--------+  SSV Pop Fossa no               >500 ms      1.12              +--------------+---------+------+-----------+------------+--------+  SSV prox calf no               >500 ms     0.595              +--------------+---------+------+-----------+------------+--------+  SSV mid calf  no               >500 ms     0.636               +--------------+---------+------+-----------+------------+--------+      Summary:  Left:  - No evidence of deep vein thrombosis seen in the left lower extremity,  from the common femoral through the popliteal veins.  - No evidence of superficial venous thrombosis in the left lower  extremity.   - Deep vein reflux in the CFV, FV prox, mid, and distal, and the popliteal  vein.   Assessment/Plan:  Deep venous reflux seen on duplex CFV, FV prox, mid, and distal, and the popliteal  vein. She does not have skin changes, weeping or history of venous stasis wounds.  No intervention is successful on the deep system and her GSV is without reflux.  She was given a vein handout and we discussed her treatment plan to include exercise, elevation and thigh compression use daily.  If she develops new symptoms she will call our office.  Of note she has palpable pedal pulse B and is not at risk of limb loss.      Mosetta Pigeon PA-C [facility] [place] Office: [phone]  MD in clinic [place]

## 2023-10-11 DIAGNOSIS — B351 Tinea unguium: Secondary | ICD-10-CM | POA: Diagnosis not present

## 2023-10-11 DIAGNOSIS — M79674 Pain in right toe(s): Secondary | ICD-10-CM | POA: Diagnosis not present

## 2023-10-15 ENCOUNTER — Telehealth: Payer: Self-pay

## 2023-10-15 ENCOUNTER — Other Ambulatory Visit: Payer: Self-pay | Admitting: Gastroenterology

## 2023-10-15 DIAGNOSIS — K219 Gastro-esophageal reflux disease without esophagitis: Secondary | ICD-10-CM

## 2023-10-15 MED ORDER — PANTOPRAZOLE SODIUM 20 MG PO TBEC: 20.0000 mg | DELAYED_RELEASE_TABLET | Freq: Every day | ORAL | 0 refills | Status: DC

## 2023-10-15 NOTE — Telephone Encounter (Signed)
 Rx sent

## 2023-10-15 NOTE — Telephone Encounter (Signed)
Pt is requesting a one month supply of protonix to get her to her appt on 11/02/2023. [facility] [place].

## 2023-10-16 ENCOUNTER — Ambulatory Visit
Admission: RE | Admit: 2023-10-16 | Discharge: 2023-10-16 | Disposition: A | Discharge status: Home / Self Care | Payer: Medicare Other | Source: Ambulatory Visit | Attending: Family Medicine | Admitting: Family Medicine

## 2023-10-16 DIAGNOSIS — Z1231 Encounter for screening mammogram for malignant neoplasm of breast: Secondary | ICD-10-CM

## 2023-10-29 DIAGNOSIS — K219 Gastro-esophageal reflux disease without esophagitis: Secondary | ICD-10-CM | POA: Diagnosis not present

## 2023-10-29 DIAGNOSIS — E7800 Pure hypercholesterolemia, unspecified: Secondary | ICD-10-CM | POA: Diagnosis not present

## 2023-10-29 DIAGNOSIS — I10 Essential (primary) hypertension: Secondary | ICD-10-CM | POA: Diagnosis not present

## 2023-10-29 DIAGNOSIS — Z634 Disappearance and death of family member: Secondary | ICD-10-CM | POA: Diagnosis not present

## 2023-10-29 DIAGNOSIS — C8239 Follicular lymphoma grade IIIa, extranodal and solid organ sites: Secondary | ICD-10-CM | POA: Diagnosis not present

## 2023-11-01 NOTE — Progress Notes (Deleted)
 Referring Provider: Mirna Mires, MD Primary Care Physician:  Mirna Mires, MD Primary GI Physician: Dr. Jena Gauss  No chief complaint on file.   HPI:   [name] is a 78 y.o. female presenting today for GERD follow-up with need for medication refills.   Last seen in the office 07/11/2022. Recommended pantoprazole 20 mg daily.   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  B-cell, favor follicular NHL.  CD79a, CD20 and CD10 positive.  Cyclin D1 negative.    Fungal infection 08/01/2013   GERD (gastroesophageal reflux disease)    Hemorrhoids 08/05/2013   Hepatic steatosis 10/20/2015   History of B-cell lymphoma 09/25/2011   Core needle biopsy of left kidney mass--lower pole 09/30/11.  B-cell, favor follicular NHL.  CD79a, CD20 and CD10 positive.  Cyclin D1 negative.    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  Kinnie Scales; 7 mm benign polyp removed from the splenic flexure, pancolonic diverticulosis, internal hemorrhoids.  Recommended 10-year repeat.   ESOPHAGOGASTRODUODENOSCOPY  06/06/2018   Dr. Kinnie Scales; normal exam.   KNEE ARTHROSCOPY W/ DEBRIDEMENT  05/2011   right   LYMPH NODE DISSECTION  x 3 different times   behind left ear 2012/ left clavical area   MR BREAST BILATERAL  cyst removal   TUBAL LIGATION      Current Outpatient Medications  Medication Sig Dispense Refill   acetaminophen (TYLENOL) 500 MG tablet Take 250 mg by mouth every 6 (six) hours as needed for moderate pain.     amlodipine-atorvastatin (CADUET) 10-10 MG tablet      aspirin 81 MG tablet Take 81 mg by mouth at bedtime.     atorvastatin (LIPITOR) 20 MG tablet Take 20 mg by mouth every other day.     Calcium Magnesium Zinc 333-133-5 MG TABS Take 5 mg by mouth daily.     cetirizine (ZYRTEC) 10 MG tablet Take 10 mg by mouth daily.     Cholecalciferol (VITAMIN D) 2000 UNITS tablet Take 2,000 Units by mouth daily.     clobetasol cream (TEMOVATE) 0.05 % Apply 1 application topically 2 (two) times daily.     ezetimibe (ZETIA) 10 MG tablet Take 10 mg by mouth daily.     losartan (COZAAR) 50 MG tablet Take 1 tablet (50 mg  total) by mouth daily. 90 tablet 3   Multiple Vitamins-Minerals (ONE-A-DAY WOMENS 50 PLUS PO) Take 1 tablet by mouth daily.     Omega-3 Fatty Acids (FISH OIL) 1000 MG CAPS Take by mouth 2 (two) times daily.     pantoprazole (PROTONIX) 20 MG tablet Take 1 tablet (20 mg total) by mouth daily before breakfast. 30 tablet 0   Probiotic Product (PROBIOTIC DAILY PO) Take 1 capsule by mouth daily.     spironolactone (ALDACTONE) 25 MG tablet Take 25 mg by mouth every morning.     No current facility-administered medications for this visit.    Allergies as of 11/02/2023 - Review Complete 10/04/2023  Allergen Reaction Noted   Latex Swelling and Rash     Family History  Problem Relation Age of Onset   Cancer Mother         breast   Stroke Mother    Breast cancer Mother 7   Cancer Father        pancreatic cancer   Heart disease Father    Cancer Brother        kidney cancer/brain tumor   Heart disease Brother    Coronary artery disease Brother    Breast cancer Paternal Aunt    Breast cancer Paternal Aunt     Social History   Socioeconomic History   Marital status: Married    Spouse name: Not on file   Number of children: Not on file   Years of education: Not on file   Highest education level: Not on file  Occupational History   Occupation: [employer]  Tobacco Use   Smoking status: Never   Smokeless tobacco: Never  Vaping Use   Vaping status: Never Used  Substance and Sexual Activity   Alcohol use: No   Drug use: No   Sexual activity: Never    Birth control/protection: None  Other Topics Concern   Not on file  Social History Narrative   Not on file   Social Drivers of Health   Financial Resource Strain: Not on file  Food Insecurity: Not on file  Transportation Needs: No Transportation Needs (07/05/2022)   PRAPARE - Administrator, Civil Service (Medical): No    Lack of Transportation (Non-Medical): No  Physical Activity: Not on file  Stress: Not on file  Social Connections: Not on file    Review of Systems: Gen: Denies fever, chills, anorexia. Denies fatigue, weakness, weight loss.  CV: Denies chest pain, palpitations, syncope, peripheral edema, and claudication. Resp: Denies dyspnea at rest, cough, wheezing, coughing up blood, and pleurisy. GI: Denies vomiting blood, jaundice, and fecal incontinence.   Denies dysphagia or odynophagia. Derm: Denies rash, itching, dry skin Psych: Denies depression, anxiety, memory loss, confusion. No homicidal or suicidal ideation.  Heme: Denies bruising, bleeding, and enlarged lymph nodes.  Physical Exam: There were no vitals taken for this visit. General:   Alert and oriented. No distress noted. Pleasant and cooperative.  Head:   Normocephalic and atraumatic. Eyes:  Conjuctiva clear without scleral icterus. Heart:  S1, S2 present without murmurs appreciated. Lungs:  Clear to auscultation bilaterally. No wheezes, rales, or rhonchi. No distress.  Abdomen:  +BS, soft, non-tender and non-distended. No rebound or guarding. No HSM or masses noted. Msk:  Symmetrical without gross deformities. Normal posture. Extremities:  Without edema. Neurologic:  Alert and  oriented x4 Psych:  Normal mood and affect.    Assessment:     Plan:  ***   Ermalinda Memos,  PA-C [facility] 11/02/2023

## 2023-11-02 ENCOUNTER — Ambulatory Visit: Payer: Medicare Other | Admitting: Gastroenterology

## 2023-11-02 NOTE — Progress Notes (Signed)
 Referring Provider: Mirna Mires, MD Primary Care Physician:  Mirna Mires, MD Primary GI Physician: Dr. Jena Gauss  Chief Complaint  Patient presents with   Follow-up    Here for further refills on pantoprazole    HPI:   [name] is a 78 y.o. female presenting today for GERD follow-up with need for medication refills.    Last seen in the office 07/11/2022. Recommended pantoprazole 20 mg daily.    Today:  Doing well on pantoprazole 20 mg daily. No nausea, vomiting, abdominal pain, or dysphagia.   Had an episode of lower abdominal pain after eating steak with [employer]. Took pepto and symptoms resolved in 30 minutes.   Bowels moving well without brbpr or melena. Had black stool only after taking a dose of pepto.    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  B-cell, favor follicular NHL.  CD79a, CD20 and CD10 positive.  Cyclin D1 negative.    Fungal infection 08/01/2013   GERD (gastroesophageal reflux disease)    Hemorrhoids 08/05/2013   Hepatic steatosis 10/20/2015   History of B-cell lymphoma 09/25/2011   Core needle biopsy of left kidney mass--lower pole 09/30/11.  B-cell, favor follicular NHL.  CD79a, CD20 and CD10 positive.  Cyclin D1 negative.    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 Kinnie Scales; 7 mm benign polyp removed from the splenic flexure, pancolonic diverticulosis, internal hemorrhoids.  Recommended 10-year repeat.   ESOPHAGOGASTRODUODENOSCOPY  06/06/2018   Dr. Kinnie Scales; normal exam.   KNEE ARTHROSCOPY W/ DEBRIDEMENT  05/2011   right   LYMPH NODE DISSECTION  x 3 different times   behind left ear 2012/ left clavical area   MR BREAST BILATERAL  cyst removal   TUBAL LIGATION      Current Outpatient Medications  Medication Sig Dispense Refill   acetaminophen (TYLENOL) 500 MG tablet Take 250 mg by mouth every 6 (six) hours as needed for moderate pain.     amlodipine-atorvastatin (CADUET) 10-10 MG tablet      aspirin 81 MG tablet Take 81 mg by mouth at bedtime.     atorvastatin (LIPITOR) 20 MG tablet Take 20 mg by mouth every other day.     Calcium Magnesium Zinc 333-133-5 MG TABS Take 5 mg by mouth daily.     cetirizine (  ZYRTEC) 10 MG tablet Take 10 mg by mouth daily.     Cholecalciferol (VITAMIN D) 2000 UNITS tablet Take 2,000 Units by mouth daily.     clobetasol cream (TEMOVATE) 0.05 % Apply 1 application topically 2 (two) times daily.     ezetimibe (ZETIA) 10 MG tablet Take 10 mg by mouth daily.     losartan (COZAAR) 50 MG tablet Take 1 tablet (50 mg total) by mouth daily. 90 tablet 3   Multiple Vitamins-Minerals (ONE-A-DAY WOMENS 50 PLUS PO) Take 1 tablet by mouth daily.     Omega-3 Fatty Acids (FISH OIL) 1000 MG CAPS Take by mouth 2 (two) times daily.     Probiotic Product (PROBIOTIC DAILY PO) Take 1 capsule by mouth daily.     spironolactone (ALDACTONE) 25 MG tablet Take 25 mg by mouth  every morning.     pantoprazole (PROTONIX) 20 MG tablet Take 1 tablet (20 mg total) by mouth daily before breakfast. 90 tablet 3   No current facility-administered medications for this visit.    Allergies as of 11/05/2023 - Review Complete 11/05/2023  Allergen Reaction Noted   Latex Swelling and Rash     Family History  Problem Relation Age of Onset   Cancer Mother        breast   Stroke Mother    Breast cancer Mother 59   Cancer Father        pancreatic cancer   Heart disease Father    Cancer Brother        kidney cancer/brain tumor   Heart disease Brother    Coronary artery disease Brother    Breast cancer Paternal Aunt    Breast cancer Paternal Aunt     Social History   Socioeconomic History   Marital status: Married    Spouse name: Not on file   Number of children: Not on file   Years of education: Not on file   Highest education level: Not on file  Occupational History   Occupation: [employer]  Tobacco Use   Smoking status: Never   Smokeless tobacco: Never  Vaping Use   Vaping status: Never Used  Substance and Sexual Activity   Alcohol use: No   Drug use: No   Sexual activity: Never    Birth control/protection: None  Other Topics Concern   Not on file  Social History Narrative   Not on file   Social Drivers of Health   Financial Resource Strain: Not on file  Food Insecurity: Not on file  Transportation Needs: No Transportation Needs (07/05/2022)   PRAPARE - Administrator, Civil Service (Medical): No    Lack of Transportation (Non-Medical): No  Physical Activity: Not on file  Stress: Not on file  Social Connections: Not on file    Review of Systems: Gen: Denies fever, chills, cold or flulike symptoms, presyncope, syncope. CV: Denies chest pain, palpitations. Resp: Denies dyspnea, cough. GI: See HPI Heme: See HPI  Physical Exam: BP 137/76 (BP Location: Right Arm, Patient Position: Sitting, Cuff Size: Normal)   Pulse 76   Temp  97.9 F (36.6 C) (Oral)   Ht 5\' 2"  (1.575 m)   Wt 172 lb 12.8 oz (78.4 kg)   SpO2 96%   BMI 31.61 kg/m  General:   Alert and oriented. No distress noted. Pleasant and cooperative.  Head:  Normocephalic and atraumatic. Eyes:  Conjuctiva clear without scleral icterus. Abdomen:  +BS, soft, non-tender and non-distended. No rebound or guarding. No HSM  or masses noted. Msk:  Symmetrical without gross deformities. Normal posture. Extremities:  Without edema. Neurologic:  Alert and  oriented x4 Psych:  Normal mood and affect.    Assessment:  78 year old female with history of chronic GERD, presenting today for follow-up with need for medication refills.  Her symptoms are currently well-controlled on pantoprazole 20 mg daily.  No alarm symptoms.  No other significant GI concerns.   Plan:  Continue pantoprazole 20 mg daily.  1 year of refills provided today. Follow-up in 1 year or sooner if needed.   Ermalinda Memos, PA-C [facility] 11/05/2023

## 2023-11-05 ENCOUNTER — Encounter: Payer: Self-pay | Admitting: Gastroenterology

## 2023-11-05 ENCOUNTER — Ambulatory Visit (INDEPENDENT_AMBULATORY_CARE_PROVIDER_SITE_OTHER): Payer: Medicare Other | Admitting: Gastroenterology

## 2023-11-05 DIAGNOSIS — H04123 Dry eye syndrome of bilateral lacrimal glands: Secondary | ICD-10-CM | POA: Diagnosis not present

## 2023-11-05 DIAGNOSIS — K219 Gastro-esophageal reflux disease without esophagitis: Secondary | ICD-10-CM

## 2023-11-05 DIAGNOSIS — H0102A Squamous blepharitis right eye, upper and lower eyelids: Secondary | ICD-10-CM | POA: Diagnosis not present

## 2023-11-05 DIAGNOSIS — H43812 Vitreous degeneration, left eye: Secondary | ICD-10-CM | POA: Diagnosis not present

## 2023-11-05 DIAGNOSIS — H0102B Squamous blepharitis left eye, upper and lower eyelids: Secondary | ICD-10-CM | POA: Diagnosis not present

## 2023-11-05 DIAGNOSIS — Z961 Presence of intraocular lens: Secondary | ICD-10-CM | POA: Diagnosis not present

## 2023-11-05 MED ORDER — PANTOPRAZOLE SODIUM 20 MG PO TBEC: 20.0000 mg | DELAYED_RELEASE_TABLET | Freq: Every day | ORAL | 3 refills | Status: AC

## 2023-11-05 NOTE — Patient Instructions (Signed)
 Continuing pantoprazole 20 mg daily 30 minutes before breakfast.  Follow a GERD diet:  Avoid fried, fatty, greasy, spicy, citrus foods. Avoid caffeine and carbonated beverages. Avoid chocolate. Try eating 4-6 small meals a day rather than 3 large meals. Do not eat within 3 hours of laying down. Prop head of bed up on wood or bricks to create a 6 inch incline.  I will follow-up with you in 1 year or sooner if needed.  It was great to see you again today!  Ermalinda Memos, PA-C [facility]

## 2023-11-20 DIAGNOSIS — L299 Pruritus, unspecified: Secondary | ICD-10-CM | POA: Diagnosis not present

## 2023-11-20 DIAGNOSIS — H6123 Impacted cerumen, bilateral: Secondary | ICD-10-CM | POA: Diagnosis not present

## 2023-11-20 DIAGNOSIS — H9202 Otalgia, left ear: Secondary | ICD-10-CM | POA: Diagnosis not present

## 2023-12-03 DIAGNOSIS — Z6830 Body mass index (BMI) 30.0-30.9, adult: Secondary | ICD-10-CM | POA: Diagnosis not present

## 2023-12-03 DIAGNOSIS — J219 Acute bronchiolitis, unspecified: Secondary | ICD-10-CM | POA: Diagnosis not present

## 2023-12-10 DIAGNOSIS — J219 Acute bronchiolitis, unspecified: Secondary | ICD-10-CM | POA: Diagnosis not present

## 2023-12-20 DIAGNOSIS — M79674 Pain in right toe(s): Secondary | ICD-10-CM | POA: Diagnosis not present

## 2023-12-20 DIAGNOSIS — B351 Tinea unguium: Secondary | ICD-10-CM | POA: Diagnosis not present

## 2024-01-07 DIAGNOSIS — R0981 Nasal congestion: Secondary | ICD-10-CM | POA: Diagnosis not present

## 2024-01-07 DIAGNOSIS — J040 Acute laryngitis: Secondary | ICD-10-CM | POA: Diagnosis not present

## 2024-01-07 DIAGNOSIS — I10 Essential (primary) hypertension: Secondary | ICD-10-CM | POA: Diagnosis not present

## 2024-01-07 DIAGNOSIS — R490 Dysphonia: Secondary | ICD-10-CM | POA: Diagnosis not present

## 2024-01-09 ENCOUNTER — Ambulatory Visit (HOSPITAL_COMMUNITY)
Admission: RE | Admit: 2024-01-09 | Discharge: 2024-01-09 | Disposition: A | Discharge status: Home / Self Care | Source: Ambulatory Visit | Attending: Family Medicine | Admitting: Family Medicine

## 2024-01-09 ENCOUNTER — Other Ambulatory Visit (HOSPITAL_COMMUNITY): Payer: Self-pay | Admitting: Family Medicine

## 2024-01-09 DIAGNOSIS — R448 Other symptoms and signs involving general sensations and perceptions: Secondary | ICD-10-CM | POA: Insufficient documentation

## 2024-01-09 DIAGNOSIS — R519 Headache, unspecified: Secondary | ICD-10-CM | POA: Diagnosis not present

## 2024-01-14 ENCOUNTER — Other Ambulatory Visit: Payer: Self-pay

## 2024-01-14 ENCOUNTER — Ambulatory Visit (INDEPENDENT_AMBULATORY_CARE_PROVIDER_SITE_OTHER): Admitting: Internal Medicine

## 2024-01-14 ENCOUNTER — Encounter: Payer: Self-pay | Admitting: Internal Medicine

## 2024-01-14 VITALS — BP 130/74 | HR 73 | Temp 98.2°F | Ht 60.75 in | Wt 175.0 lb

## 2024-01-14 DIAGNOSIS — K219 Gastro-esophageal reflux disease without esophagitis: Secondary | ICD-10-CM | POA: Diagnosis not present

## 2024-01-14 DIAGNOSIS — J3089 Other allergic rhinitis: Secondary | ICD-10-CM | POA: Diagnosis not present

## 2024-01-14 MED ORDER — CETIRIZINE HCL 10 MG PO TABS: 10.0000 mg | ORAL_TABLET | Freq: Every day | ORAL | 1 refills | Status: AC | PRN

## 2024-01-14 NOTE — Patient Instructions (Addendum)
 Other Allergic Rhinitis: - Bring all your allergy medications with you at next visit.  - Use Zyrtec 10 mg daily.    GERD: - Continue follow up with GI.  Continue Protonix  20mg  in the morning on an empty stomach.  -Avoid lying down for at least two hours after a meal or after drinking acidic beverages, like soda, or other caffeinated beverages. This can help to prevent stomach contents from flowing back into the esophagus. -Keep your head elevated while you sleep. Using an extra pillow or two can also help to prevent reflux. -Eat smaller and more frequent meals each day instead of a few large meals. This promotes digestion and can aid in preventing heartburn. -Wear loose-fitting clothes to ease pressure on the stomach, which can worsen heartburn and reflux. -Reduce excess weight around the midsection. This can ease pressure on the stomach. Such pressure can force some stomach contents back up the esophagus.    Hold all anti-histamines (Xyzal, Allegra, Zyrtec, Claritin , Benadryl , Pepcid) 3 days prior to next visit.  Follow up: skin testing 1-55, no IDs.

## 2024-01-14 NOTE — Progress Notes (Signed)
 NEW PATIENT  Date of Service/Encounter:  01/14/24  Consult requested by: Wilburn Handler, MD   Subjective:   [name] (DOB: [birth year]) is a 78 y.o. female who presents to the clinic on 01/14/2024 with a chief complaint of congestion, headaches, sinus problems.   History obtained from: chart review and patient.  Rhinitis:  Started many years ago, worse this year.   Symptoms include: nasal congestion, rhinorrhea, and post nasal drainage  Occurs year-round with seasonal flares in Spring/Fall Potential triggers: not sure  Treatments tried:  Zyrtec  Vicks helps  Unknown nose sprays   Previous allergy testing: no History of sinus surgery: no Nonallergic triggers: none     GERD: On Protonix  helps with gerd/reflux Followed by GI    Reviewed:  11/20/2023: seen by ENT for itching/otalgia of ear, noted to have cerumen impaction.  Use Otic oil or baby oil.   11/05/2023:seen by April Knack PA for GERD, controlled on Protonix  20mg  daily.   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   On amlodipine, spironolactone, losartan , lipitor, zetia.     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  B-cell, favor follicular NHL.  CD79a, CD20 and CD10 positive.  Cyclin D1 negative.    Fungal infection 08/01/2013   GERD (gastroesophageal reflux disease)    Hemorrhoids 08/05/2013   Hepatic steatosis 10/20/2015   History of B-cell lymphoma 09/25/2011   Core needle biopsy of left kidney  mass--lower pole 09/30/11.  B-cell, favor follicular NHL.  CD79a, CD20 and CD10 positive.  Cyclin D1 negative.    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 Andriette Keeling; 7 mm benign polyp removed from the splenic flexure, pancolonic diverticulosis, internal hemorrhoids.  Recommended 10-year repeat.   ESOPHAGOGASTRODUODENOSCOPY  06/06/2018   Dr. Andriette Keeling; normal exam.   KNEE ARTHROSCOPY W/ DEBRIDEMENT  05/2011   right   LYMPH NODE DISSECTION  x 3 different times   behind left ear 2012/ left clavical area   MR BREAST BILATERAL  cyst removal   TUBAL LIGATION      Family History: Family History  Problem Relation Age of Onset   Angioedema Mother    Cancer Mother  breast   Stroke Mother    Breast cancer Mother 9   Angioedema Father    Cancer Father        pancreatic cancer   Heart disease Father    Cancer Brother        kidney cancer/brain tumor   Heart disease Brother    Coronary artery disease Brother    Breast cancer Paternal Aunt    Breast cancer Paternal Aunt     Social History:  Flooring in bedroom: wood Pets: none Tobacco use/exposure: none  Job: retired  Medication List:  Allergies as of 01/14/2024       Reactions   Latex Swelling, Rash         Medication List        Accurate as of Jan 14, 2024 11:36 AM. If you have any questions, ask your nurse or doctor.          acetaminophen  500 MG tablet Commonly known as: TYLENOL  Take 250 mg by mouth every 6 (six) hours as needed for moderate pain.   amlodipine-atorvastatin 10-10 MG tablet Commonly known as: CADUET   aspirin  81 MG tablet Take 81 mg by mouth at bedtime.   atorvastatin 20 MG tablet Commonly known as: LIPITOR Take 20 mg by mouth every other day.   Calcium Magnesium Zinc 333-133-5 MG Tabs Take 5 mg by mouth daily.   cetirizine 10 MG tablet Commonly known as: ZYRTEC Take 10 mg by mouth daily.   clobetasol cream 0.05 % Commonly known as: TEMOVATE Apply 1 application topically 2 (two) times daily.   ezetimibe 10 MG tablet Commonly known as: ZETIA Take 10 mg by mouth daily.   Fish Oil 1000 MG Caps Take by mouth 2 (two) times daily.   losartan  50 MG tablet Commonly known as: COZAAR  Take 1 tablet (50 mg total) by mouth daily.   ONE-A-DAY WOMENS 50 PLUS PO Take 1 tablet by mouth daily.   pantoprazole  20 MG tablet Commonly known as: PROTONIX  Take 1 tablet (20 mg total) by mouth daily before breakfast.   PROBIOTIC DAILY PO Take 1 capsule by mouth daily.   spironolactone 25 MG tablet Commonly known as: ALDACTONE Take 25 mg by mouth every morning.   Vitamin D  50 MCG (2000 UT) tablet Take 2,000 Units by mouth daily.         REVIEW OF SYSTEMS: Pertinent positives and negatives discussed in HPI.   Objective:   Physical Exam: BP 130/74   Pulse 73   Temp 98.2 F (36.8 C)   Ht 5' 0.75" (1.543 m)   Wt 175 lb (79.4 kg)   SpO2 98%   BMI 33.34 kg/m  Body mass index is 33.34 kg/m. GEN: alert, well developed HEENT: clear conjunctiva, nose with + mild inferior turbinate hypertrophy, pink nasal mucosa, no rhinorrhea, + cobblestoning HEART: regular rate and rhythm, no murmur LUNGS: clear to auscultation bilaterally, no coughing,  unlabored respiration ABDOMEN: soft, non distended  SKIN: no rashes or lesions    Assessment:   1. Other allergic rhinitis   2. Gastroesophageal reflux disease, unspecified whether esophagitis present     Plan/Recommendations:  Other Allergic Rhinitis: - Due to turbinate hypertrophy, seasonal symptoms and unresponsive to over the counter meds, will perform skin testing to identify aeroallergen triggers at next visit.   - Bring allergy medications with you at next visit.  - Use nasal saline rinses before nose sprays such as with Neilmed Sinus Rinse.  Use distilled water.   - Use  Zyrtec 10 mg daily.    GERD: - Follow up with GI.  Continue Protonix  20mg  in the morning on an empty stomach.  -Avoid lying down for at least two hours after a meal or after drinking acidic beverages, like soda, or other caffeinated beverages. This can help to prevent stomach contents from flowing back into the esophagus. -Keep your head elevated while you sleep. Using an extra pillow or two can also help to prevent reflux. -Eat smaller and more frequent meals each day instead of a few large meals. This promotes digestion and can aid in preventing heartburn. -Wear loose-fitting clothes to ease pressure on the stomach, which can worsen heartburn and reflux. -Reduce excess weight around the midsection. This can ease pressure on the stomach. Such pressure can force some stomach contents back up the esophagus.    Hold all anti-histamines (Xyzal, Allegra, Zyrtec, Claritin , Benadryl , Pepcid) 3 days prior to next visit.  Follow up: skin testing 1-55, no IDs.  She is afraid of needles/scarring.     Kristen Petri, MD [facility] 

## 2024-01-21 ENCOUNTER — Encounter: Payer: Self-pay | Admitting: Allergy

## 2024-01-21 ENCOUNTER — Ambulatory Visit (INDEPENDENT_AMBULATORY_CARE_PROVIDER_SITE_OTHER): Admitting: Allergy

## 2024-01-21 VITALS — BP 140/76

## 2024-01-21 DIAGNOSIS — J3089 Other allergic rhinitis: Secondary | ICD-10-CM

## 2024-01-21 MED ORDER — IPRATROPIUM BROMIDE 0.03 % NA SOLN: 2.0000 | Freq: Three times a day (TID) | NASAL | 5 refills | Status: DC | PRN

## 2024-01-21 MED ORDER — LEVOCETIRIZINE DIHYDROCHLORIDE 5 MG PO TABS: 5.0000 mg | ORAL_TABLET | Freq: Every day | ORAL | 5 refills | Status: DC

## 2024-01-21 MED ORDER — FLUTICASONE PROPIONATE 50 MCG/ACT NA SUSP: 2.0000 | Freq: Every day | NASAL | 5 refills | Status: DC

## 2024-01-21 NOTE — Patient Instructions (Signed)
 Other Allergic Rhinitis: - Testing today showed: weeds, trees, and indoor molds - Copy of test results provided.  - Avoidance measures provided. - Change Zyrtec  to Xyzal 5mg  daily.  This is a long-acting antihistamine that may be more effective than Zyrtec  - Use Ipratropium nasal spray 2 sprays each nostril up to 3-4 times a day as needed for nasal drainage - Use fluticasone nasal spray 2 sprays each nostril daily for 1-2 weeks at a time before stopping once nasal congestion improves for maximum benefit With using nasal sprays point tip of bottle toward eye on same side nostril and lean head slightly forward for best technique.  - Consider nasal saline rinses 1-2 times daily to remove allergens from the nasal cavities as well as help with mucous clearance (this is especially helpful to do before the nasal sprays are given) - Consider allergy shots as a means of long-term control if medication management is not effective in the. - Allergy shots "re-train" and "reset" the immune system to ignore environmental allergens and decrease the resulting immune response to those allergens (sneezing, itchy watery eyes, runny nose, nasal congestion, etc).    - Allergy shots improve symptoms in 75-85% of patients.  - We can discuss more at  a future appointment if the medications are not working for you.   GERD: - Continue follow up with GI.  Continue Protonix  20mg  in the morning on an empty stomach.  -Avoid lying down for at least two hours after a meal or after drinking acidic beverages, like soda, or other caffeinated beverages. This can help to prevent stomach contents from flowing back into the esophagus. -Keep your head elevated while you sleep. Using an extra pillow or two can also help to prevent reflux. -Eat smaller and more frequent meals each day instead of a few large meals. This promotes digestion and can aid in preventing heartburn. -Wear loose-fitting clothes to ease pressure on the stomach,  which can worsen heartburn and reflux. -Reduce excess weight around the midsection. This can ease pressure on the stomach. Such pressure can force some stomach contents back up the esophagus.    Follow up: 4-6 months or sooner if needed

## 2024-01-21 NOTE — Progress Notes (Signed)
 Follow-up Note  RE: [name] MRN: [id number] DOB: 1946-04-07 Date of Office Visit: 01/21/2024   History of present illness: [name] is a 78 y.o. female presenting today for skin testing visit.  She was last seen in the office on 01/14/24 for allergic rhinitis.  She is in her usual state of health today without recent illness.  She has held antihistamines for at least 3 days for testing today.   Medication List: Current Outpatient Medications  Medication Sig Dispense Refill   acetaminophen  (TYLENOL ) 500 MG tablet Take 250 mg by mouth every 6 (six) hours as needed for moderate pain.     amlodipine-atorvastatin (CADUET) 10-10 MG tablet      aspirin  81 MG tablet Take 81 mg by mouth at bedtime.     atorvastatin (LIPITOR) 20 MG tablet Take 20 mg by mouth every other day.     Calcium Magnesium Zinc 333-133-5 MG TABS Take 5 mg by mouth daily.     cetirizine  (ZYRTEC ) 10 MG tablet Take 1 tablet (10 mg total) by mouth daily as needed for allergies (Can take an extra dose during flare ups.). 180 tablet 1   Cholecalciferol  (VITAMIN D ) 2000 UNITS tablet Take 2,000 Units by mouth daily.     clobetasol cream (TEMOVATE) 0.05 % Apply 1 application topically 2 (two) times daily.     ezetimibe (ZETIA) 10 MG tablet Take 10 mg by mouth daily.     losartan  (COZAAR ) 50 MG tablet Take 1 tablet (50 mg total) by mouth daily. 90 tablet 3   Multiple Vitamins-Minerals (ONE-A-DAY WOMENS 50 PLUS PO) Take 1 tablet by mouth daily.     Omega-3 Fatty Acids (FISH OIL) 1000 MG CAPS Take by mouth 2 (two) times daily.     pantoprazole  (PROTONIX ) 20 MG tablet Take 1 tablet (20 mg total) by mouth daily before breakfast. 90 tablet 3   Probiotic Product (PROBIOTIC DAILY PO) Take 1 capsule by mouth daily.     spironolactone (ALDACTONE) 25 MG tablet Take 25 mg by mouth every morning.     No current facility-administered medications for this visit.     Known medication allergies: Allergies  Allergen Reactions    Latex Swelling and Rash   Diagnositics/Labs:  Allergy testing:   Airborne Adult Perc - 01/21/24 0909     Time Antigen Placed 8657    Allergen Manufacturer Floyd Hutchinson    Location Back    Number of Test 55    1. Control-Buffer 50% Glycerol Negative    2. Control-Histamine 2+    3. Bahia Negative    4. French [place] Negative    5. Johnson Negative    6. [place]  Blue Negative    7. Meadow Fescue Negative    8. Perennial Rye Negative    9. Timothy Negative    10. Ragweed Mix Negative    11. Cocklebur Negative    12. Plantain,  English Negative    13. Baccharis Negative    14. Dog Fennel Negative    15. Russian Thistle Negative    16. Lamb's Quarters Negative    17. Sheep Sorrell Negative    18. Rough Pigweed 2+    19. Marsh Elder, Rough Negative    20. Mugwort, Common Negative    21. Box, Elder Negative    22. Cedar, red Negative    23. Sweet Gum 2+    24. Pecan Pollen 2+    25. Pine Mix Negative    26. Rosebud, [place]  Pollen Negative    27. Red Mulberry Negative    28. Ash Mix Negative    29. Birch Mix Negative    30. Beech American Negative    31. Cottonwood, [place] Negative    32. Hickory, White Negative    33. Maple Mix Negative    34. Oak, [place] Mix Negative    35. Sycamore Eastern Negative    36. Alternaria Alternata Negative    37. Cladosporium Herbarum Negative    38. Aspergillus Mix 2+    39. Penicillium Mix 2+    40. Bipolaris Sorokiniana (Helminthosporium) Negative    41. Drechslera Spicifera (Curvularia) Negative    42. Mucor Plumbeus Negative    43. Fusarium Moniliforme Negative    44. Aureobasidium Pullulans (pullulara) Negative    45. Rhizopus Oryzae Negative    46. Botrytis Cinera Negative    47. Epicoccum Nigrum Negative    48. Phoma Betae Negative    49. Dust Mite Mix Negative    50. Cat Hair 10,000 BAU/ml Negative    51.  Dog Epithelia Negative    52. Mixed Feathers Negative    53. Horse Epithelia Negative    54. Cockroach, German Negative    55.  Tobacco Leaf Negative             Allergy testing results were read and interpreted by provider, documented by clinical staff.   Assessment and plan: Other Allergic Rhinitis: - Testing today showed: weeds, trees, and indoor molds - Copy of test results provided.  - Avoidance measures provided. - Change Zyrtec  to Xyzal 5mg  daily.  This is a long-acting antihistamine that may be more effective than Zyrtec  - Use Ipratropium nasal spray 2 sprays each nostril up to 3-4 times a day as needed for nasal drainage - Use fluticasone nasal spray 2 sprays each nostril daily for 1-2 weeks at a time before stopping once nasal congestion improves for maximum benefit With using nasal sprays point tip of bottle toward eye on same side nostril and lean head slightly forward for best technique.  - Consider nasal saline rinses 1-2 times daily to remove allergens from the nasal cavities as well as help with mucous clearance (this is especially helpful to do before the nasal sprays are given) - Consider allergy shots as a means of long-term control if medication management is not effective in the. - Allergy shots "re-train" and "reset" the immune system to ignore environmental allergens and decrease the resulting immune response to those allergens (sneezing, itchy watery eyes, runny nose, nasal congestion, etc).    - Allergy shots improve symptoms in 75-85% of patients.  - We can discuss more at  a future appointment if the medications are not working for you.   GERD: - Continue follow up with GI.  Continue Protonix  20mg  in the morning on an empty stomach.  -Avoid lying down for at least two hours after a meal or after drinking acidic beverages, like soda, or other caffeinated beverages. This can help to prevent stomach contents from flowing back into the esophagus. -Keep your head elevated while you sleep. Using an extra pillow or two can also help to prevent reflux. -Eat smaller and more frequent meals each  day instead of a few large meals. This promotes digestion and can aid in preventing heartburn. -Wear loose-fitting clothes to ease pressure on the stomach, which can worsen heartburn and reflux. -Reduce excess weight around the midsection. This can ease pressure on the stomach. Such pressure can force  some stomach contents back up the esophagus.    Follow up: 4-6 months or sooner if needed  I appreciate the opportunity to take part in Pang's care. Please do not hesitate to contact me with questions.  Sincerely,   Catha Clink, MD Allergy/Immunology [facility]

## 2024-01-28 DIAGNOSIS — H04123 Dry eye syndrome of bilateral lacrimal glands: Secondary | ICD-10-CM | POA: Diagnosis not present

## 2024-02-05 DIAGNOSIS — Z6831 Body mass index (BMI) 31.0-31.9, adult: Secondary | ICD-10-CM | POA: Diagnosis not present

## 2024-02-05 DIAGNOSIS — R3915 Urgency of urination: Secondary | ICD-10-CM | POA: Diagnosis not present

## 2024-03-03 DIAGNOSIS — W57XXXA Bitten or stung by nonvenomous insect and other nonvenomous arthropods, initial encounter: Secondary | ICD-10-CM | POA: Diagnosis not present

## 2024-03-03 DIAGNOSIS — N390 Urinary tract infection, site not specified: Secondary | ICD-10-CM | POA: Diagnosis not present

## 2024-03-05 DIAGNOSIS — M170 Bilateral primary osteoarthritis of knee: Secondary | ICD-10-CM | POA: Diagnosis not present

## 2024-03-06 DIAGNOSIS — B351 Tinea unguium: Secondary | ICD-10-CM | POA: Diagnosis not present

## 2024-03-06 DIAGNOSIS — M79674 Pain in right toe(s): Secondary | ICD-10-CM | POA: Diagnosis not present

## 2024-03-12 DIAGNOSIS — M1712 Unilateral primary osteoarthritis, left knee: Secondary | ICD-10-CM | POA: Diagnosis not present

## 2024-03-12 DIAGNOSIS — M170 Bilateral primary osteoarthritis of knee: Secondary | ICD-10-CM | POA: Diagnosis not present

## 2024-03-12 DIAGNOSIS — M1711 Unilateral primary osteoarthritis, right knee: Secondary | ICD-10-CM | POA: Diagnosis not present

## 2024-03-19 DIAGNOSIS — M1712 Unilateral primary osteoarthritis, left knee: Secondary | ICD-10-CM | POA: Diagnosis not present

## 2024-03-19 DIAGNOSIS — M1711 Unilateral primary osteoarthritis, right knee: Secondary | ICD-10-CM | POA: Diagnosis not present

## 2024-03-19 DIAGNOSIS — M170 Bilateral primary osteoarthritis of knee: Secondary | ICD-10-CM | POA: Diagnosis not present

## 2024-04-07 ENCOUNTER — Other Ambulatory Visit: Payer: Self-pay | Admitting: Family Medicine

## 2024-04-07 DIAGNOSIS — N644 Mastodynia: Secondary | ICD-10-CM

## 2024-04-11 ENCOUNTER — Ambulatory Visit
Admission: RE | Admit: 2024-04-11 | Discharge: 2024-04-11 | Disposition: A | Discharge status: Home / Self Care | Source: Ambulatory Visit | Attending: Family Medicine | Admitting: Family Medicine

## 2024-04-11 DIAGNOSIS — R928 Other abnormal and inconclusive findings on diagnostic imaging of breast: Secondary | ICD-10-CM | POA: Diagnosis not present

## 2024-04-11 DIAGNOSIS — N644 Mastodynia: Secondary | ICD-10-CM

## 2024-04-17 DIAGNOSIS — D225 Melanocytic nevi of trunk: Secondary | ICD-10-CM | POA: Diagnosis not present

## 2024-04-17 DIAGNOSIS — Z1283 Encounter for screening for malignant neoplasm of skin: Secondary | ICD-10-CM | POA: Diagnosis not present

## 2024-05-06 DIAGNOSIS — N644 Mastodynia: Secondary | ICD-10-CM | POA: Diagnosis not present

## 2024-05-09 ENCOUNTER — Other Ambulatory Visit: Payer: Self-pay | Admitting: Cardiology

## 2024-05-22 DIAGNOSIS — M170 Bilateral primary osteoarthritis of knee: Secondary | ICD-10-CM | POA: Diagnosis not present

## 2024-05-27 DIAGNOSIS — M79675 Pain in left toe(s): Secondary | ICD-10-CM | POA: Diagnosis not present

## 2024-05-27 DIAGNOSIS — L84 Corns and callosities: Secondary | ICD-10-CM | POA: Diagnosis not present

## 2024-05-27 DIAGNOSIS — M79674 Pain in right toe(s): Secondary | ICD-10-CM | POA: Diagnosis not present

## 2024-05-27 DIAGNOSIS — B351 Tinea unguium: Secondary | ICD-10-CM | POA: Diagnosis not present

## 2024-06-18 DIAGNOSIS — Z23 Encounter for immunization: Secondary | ICD-10-CM | POA: Diagnosis not present

## 2024-06-25 ENCOUNTER — Encounter (INDEPENDENT_AMBULATORY_CARE_PROVIDER_SITE_OTHER): Payer: Self-pay | Admitting: Gastroenterology

## 2024-06-26 DIAGNOSIS — H6123 Impacted cerumen, bilateral: Secondary | ICD-10-CM | POA: Diagnosis not present

## 2024-06-26 DIAGNOSIS — J309 Allergic rhinitis, unspecified: Secondary | ICD-10-CM | POA: Diagnosis not present

## 2024-06-26 DIAGNOSIS — R0982 Postnasal drip: Secondary | ICD-10-CM | POA: Diagnosis not present

## 2024-06-26 DIAGNOSIS — L299 Pruritus, unspecified: Secondary | ICD-10-CM | POA: Diagnosis not present

## 2024-07-01 DIAGNOSIS — Z23 Encounter for immunization: Secondary | ICD-10-CM | POA: Diagnosis not present

## 2024-07-06 ENCOUNTER — Other Ambulatory Visit: Payer: Self-pay | Admitting: Allergy

## 2024-07-10 ENCOUNTER — Inpatient Hospital Stay: Payer: Medicare Other | Attending: Hematology and Oncology

## 2024-07-10 ENCOUNTER — Inpatient Hospital Stay: Payer: Medicare Other | Admitting: Hematology and Oncology

## 2024-07-10 ENCOUNTER — Encounter: Payer: Self-pay | Admitting: Hematology and Oncology

## 2024-07-10 VITALS — BP 150/75 | HR 59 | Temp 98.2°F | Resp 18 | Ht 60.75 in | Wt 176.0 lb

## 2024-07-10 DIAGNOSIS — Z8572 Personal history of non-Hodgkin lymphomas: Secondary | ICD-10-CM | POA: Diagnosis not present

## 2024-07-10 DIAGNOSIS — C44729 Squamous cell carcinoma of skin of left lower limb, including hip: Secondary | ICD-10-CM | POA: Diagnosis not present

## 2024-07-10 DIAGNOSIS — Z85828 Personal history of other malignant neoplasm of skin: Secondary | ICD-10-CM | POA: Diagnosis not present

## 2024-07-10 DIAGNOSIS — Z9221 Personal history of antineoplastic chemotherapy: Secondary | ICD-10-CM | POA: Insufficient documentation

## 2024-07-10 LAB — CBC WITH DIFFERENTIAL/PLATELET
Abs Immature Granulocytes: 0.01 K/uL (ref 0.00–0.07)
Basophils Absolute: 0.1 K/uL (ref 0.0–0.1)
Basophils Relative: 2 %
Eosinophils Absolute: 0.2 K/uL (ref 0.0–0.5)
Eosinophils Relative: 4 %
HCT: 37.4 % (ref 36.0–46.0)
Hemoglobin: 12.9 g/dL (ref 12.0–15.0)
Immature Granulocytes: 0 %
Lymphocytes Relative: 30 %
Lymphs Abs: 1.2 K/uL (ref 0.7–4.0)
MCH: 29.1 pg (ref 26.0–34.0)
MCHC: 34.5 g/dL (ref 30.0–36.0)
MCV: 84.2 fL (ref 80.0–100.0)
Monocytes Absolute: 0.3 K/uL (ref 0.1–1.0)
Monocytes Relative: 9 %
Neutro Abs: 2.2 K/uL (ref 1.7–7.7)
Neutrophils Relative %: 55 %
Platelets: 176 K/uL (ref 150–400)
RBC: 4.44 MIL/uL (ref 3.87–5.11)
RDW: 13.8 % (ref 11.5–15.5)
WBC: 4 K/uL (ref 4.0–10.5)
nRBC: 0 % (ref 0.0–0.2)

## 2024-07-10 LAB — COMPREHENSIVE METABOLIC PANEL WITH GFR
ALT: 21 U/L (ref 0–44)
AST: 25 U/L (ref 15–41)
Albumin: 4.4 g/dL (ref 3.5–5.0)
Alkaline Phosphatase: 88 U/L (ref 38–126)
Anion gap: 7 (ref 5–15)
BUN: 16 mg/dL (ref 8–23)
CO2: 28 mmol/L (ref 22–32)
Calcium: 9.3 mg/dL (ref 8.9–10.3)
Chloride: 107 mmol/L (ref 98–111)
Creatinine, Ser: 1.05 mg/dL — ABNORMAL HIGH (ref 0.44–1.00)
GFR, Estimated: 54 mL/min — ABNORMAL LOW (ref >60)
Glucose, Bld: 94 mg/dL (ref 70–99)
Potassium: 4.1 mmol/L (ref 3.5–5.1)
Sodium: 142 mmol/L (ref 135–145)
Total Bilirubin: 0.8 mg/dL (ref 0.0–1.2)
Total Protein: 6.6 g/dL (ref 6.5–8.1)

## 2024-07-10 NOTE — Assessment & Plan Note (Addendum)
 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 PET/CT imaging from April 2015 confirmed complete response and she was placed on maintenance rituximab  every other month for 2 years, completed by February 2017  Clinically, she has no signs or symptoms of recurrent lymphoma There is no benefit for surveillance imaging I will see her again next year for further follow-up I reviewed her vaccination program and she is up-to-date

## 2024-07-10 NOTE — Assessment & Plan Note (Addendum)
 She had history of recurrent squamous cell carcinoma and she is vigilant with her dermatology exam

## 2024-07-10 NOTE — Progress Notes (Signed)
 [facility] OFFICE PROGRESS NOTE  Patient Care Team: Leigh Lung, MD as PCP - General (Family Medicine) Lavona Agent, MD as PCP - Cardiology (Cardiology) Lonn Hicks, MD as Consulting Physician (Hematology and Oncology) Morgan Clayborne CROME, RN as [facility] Rourk, Lamar HERO, MD as Consulting Physician (Gastroenterology)  Assessment & Plan History of B-cell lymphoma The patient was diagnosed with lymphoma after presentation in 2012 with significant diffuse lymphadenopathy as well as abnormal left kidney lesion 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    Hicks Lonn, MD  INTERVAL HISTORY: she returns for surveillance follow-up She lost her husband last year due to malignant melanoma She is up-to-date with her annual physical exam with her dermatologist Denies lymphadenopathy or abnormal weight loss  PHYSICAL EXAMINATION: ECOG PERFORMANCE STATUS: 0 - Asymptomatic  Vitals:   07/10/24 0902  BP: (!) 150/75  Pulse: (!) 59  Resp: 18  Temp: 98.2 F (36.8 C)  SpO2: 98%   Filed Weights   07/10/24 0902  Weight: 176 lb (79.8 kg)    GENERAL:alert, no distress and comfortable SKIN: skin color, texture, turgor are normal, no rashes or significant lesions EYES: normal, conjunctiva are pink and non-injected, sclera clear OROPHARYNX:no exudate, no erythema and lips, buccal mucosa, and tongue normal  NECK: supple, thyroid  normal size, non-tender, without nodularity LYMPH:  no palpable lymphadenopathy in the cervical, axillary or inguinal LUNGS: clear to auscultation and percussion with normal breathing effort HEART: regular rate & rhythm and no murmurs and no lower extremity edema ABDOMEN:abdomen soft, non-tender and normal bowel sounds Musculoskeletal:no cyanosis of digits and no clubbing  PSYCH: alert & oriented x 3 with fluent speech NEURO: no focal motor/sensory deficits  Relevant data reviewed during this visit included CBC and CMP

## 2024-07-23 NOTE — Progress Notes (Signed)
  Cardiology Office Note:   Date:  07/24/2024  ID:  Harlea, Goetzinger 14-Dec-1945, MRN [id number] PCP: Leigh Lung, MD  [facility] Providers Cardiologist:  Lynwood Schilling, MD {  History of Present Illness:   [name] is a 78 y.o. female  who presents for follow up of a coronary anomaly.   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  At the last visit she had no new cardiovascular problems.  The patient denies any new symptoms such as chest discomfort, neck or arm discomfort. There has been no new shortness of breath, PND or orthopnea. There have been no reported palpitations, presyncope or syncope.    She lost her husband of 50 years.   ROS: She developed some tingling in her hands and feet.  Otherwise as stated in the HPI and negative for all other systems.  Studies Reviewed:    EKG:   EKG Interpretation Date/Time:  Thursday July 24 2024 10:31:58 EST Ventricular Rate:  69 PR Interval:  182 QRS Duration:  84 QT Interval:  376 QTC Calculation: 402 R Axis:   43  Text Interpretation: Normal sinus rhythm Poor anterior R wave progression When compared with ECG of 06-Jul-2023 08:22, Confirmed by Schilling Lynwood ([id number]) on 07/24/2024 10:44:17 AM     Risk Assessment/Calculations:       Physical Exam:   VS:  BP (!) 158/83   Pulse 68   Ht 5' 0.75 (1.543 m)   Wt 174 lb 6.4 oz (79.1 kg)   SpO2 97%   BMI 33.22 kg/m    Wt Readings from Last 3 Encounters:  07/24/24 174 lb 6.4 oz (79.1 kg)  07/10/24 176 lb (79.8 kg)  01/14/24 175 lb (79.4 kg)     GEN: Well nourished, well developed in no acute distress NECK: No JVD; No carotid bruits CARDIAC: RRR, no murmurs, rubs, gallops RESPIRATORY:  Clear to auscultation without rales, wheezing or rhonchi  ABDOMEN: Soft, non-tender, non-distended EXTREMITIES:  No edema; No deformity   ASSESSMENT AND  PLAN:   CORONARY ARTERY ANOMALY, CONGENITAL The patient has no new symptoms.  No change in therapy.    ESSENTIAL HYPERTENSION, BENIGN The blood pressure is not at target.  I get increase the Cozaar  to 75 mg daily.  She will keep a blood pressure diary.  HYPERLIPIDEMIA Her LDL was 85 with an HDL of 66.  This ratio was reasonable.  No change in therapy.    EDEMA:     She wears compression stockings and says this is controllable.  No change in therapy.      CKD: She does have some very mild renal insufficiency and I will be checking a basic metabolic profile in 2 weeks.   Follow up with me in 12 months.   Signed, Lynwood Schilling, MD

## 2024-07-24 ENCOUNTER — Ambulatory Visit: Attending: Cardiology | Admitting: Cardiology

## 2024-07-24 ENCOUNTER — Other Ambulatory Visit: Payer: Self-pay

## 2024-07-24 ENCOUNTER — Encounter: Payer: Self-pay | Admitting: Cardiology

## 2024-07-24 VITALS — BP 158/83 | HR 68 | Ht 60.75 in | Wt 174.4 lb

## 2024-07-24 DIAGNOSIS — I10 Essential (primary) hypertension: Secondary | ICD-10-CM | POA: Diagnosis present

## 2024-07-24 DIAGNOSIS — M7989 Other specified soft tissue disorders: Secondary | ICD-10-CM | POA: Insufficient documentation

## 2024-07-24 DIAGNOSIS — E785 Hyperlipidemia, unspecified: Secondary | ICD-10-CM | POA: Diagnosis not present

## 2024-07-24 DIAGNOSIS — Q245 Malformation of coronary vessels: Secondary | ICD-10-CM | POA: Diagnosis not present

## 2024-07-24 MED ORDER — LOSARTAN POTASSIUM 25 MG PO TABS: 75.0000 mg | ORAL_TABLET | Freq: Every day | ORAL | 3 refills | Status: DC

## 2024-07-24 MED ORDER — LOSARTAN POTASSIUM 25 MG PO TABS: 75.0000 mg | ORAL_TABLET | Freq: Every day | ORAL | 3 refills | Status: AC

## 2024-07-24 NOTE — Patient Instructions (Signed)
 Medication Instructions:  Increase Cozaar  to 75 mg once daily (3 tablets) *If you need a refill on your cardiac medications before your next appointment, please call your pharmacy*  Lab Work: BMET in 2 weeks at [facility] If you have labs (blood work) drawn today and your tests are completely normal, you will receive your results only by: MyChart Message (if you have MyChart) OR A paper copy in the mail If you have any lab test that is abnormal or we need to change your treatment, we will call you to review the results.  Testing/Procedures: NONE  Follow-Up: At [facility], you and your health needs are our priority.  As part of our continuing mission to provide you with exceptional heart care, our providers are all part of one team.  This team includes your primary Cardiologist (physician) and Advanced Practice Providers or APPs (Physician Assistants and Nurse Practitioners) who all work together to provide you with the care you need, when you need it.  Your next appointment:   1 year(s)  Provider:   Lynwood Schilling, MD    We recommend signing up for the patient portal called MyChart.  Sign up information is provided on this After Visit Summary.  MyChart is used to connect with patients for Virtual Visits (Telemedicine).  Patients are able to view lab/test results, encounter notes, upcoming appointments, etc.  Non-urgent messages can be sent to your provider as well.   To learn more about what you can do with MyChart, go to [email or web address].

## 2024-07-28 ENCOUNTER — Encounter: Payer: Self-pay | Admitting: Internal Medicine

## 2024-07-28 ENCOUNTER — Ambulatory Visit: Admitting: Internal Medicine

## 2024-07-28 ENCOUNTER — Telehealth: Payer: Self-pay | Admitting: Pharmacy Technician

## 2024-07-28 ENCOUNTER — Other Ambulatory Visit (HOSPITAL_COMMUNITY): Payer: Self-pay

## 2024-07-28 VITALS — BP 132/84 | HR 70 | Temp 98.2°F | Ht 61.0 in | Wt 172.6 lb

## 2024-07-28 DIAGNOSIS — J302 Other seasonal allergic rhinitis: Secondary | ICD-10-CM | POA: Diagnosis not present

## 2024-07-28 DIAGNOSIS — K219 Gastro-esophageal reflux disease without esophagitis: Secondary | ICD-10-CM | POA: Diagnosis not present

## 2024-07-28 DIAGNOSIS — J3089 Other allergic rhinitis: Secondary | ICD-10-CM | POA: Diagnosis not present

## 2024-07-28 MED ORDER — AZELASTINE HCL 0.1 % NA SOLN: 2.0000 | Freq: Two times a day (BID) | NASAL | 5 refills | Status: AC | PRN

## 2024-07-28 MED ORDER — LEVOCETIRIZINE DIHYDROCHLORIDE 5 MG PO TABS: 5.0000 mg | ORAL_TABLET | Freq: Every day | ORAL | 5 refills | Status: AC

## 2024-07-28 MED ORDER — FLUTICASONE PROPIONATE 50 MCG/ACT NA SUSP: 2.0000 | Freq: Every day | NASAL | 5 refills | Status: AC

## 2024-07-28 NOTE — Progress Notes (Signed)
 FOLLOW UP Date of Service/Encounter:  07/28/24   Subjective:  [name] (DOB: 04/09/1946) is a 78 y.o. female who [facility]24 for follow up for GERD and allergic rhinitis.   History obtained from: chart review and patient. Last visit was with Dr jeneal on 01/21/2024 and as SPT positive to weeds, trees, molds. Started on Xyzal , Flonase  and Atrovent .   Since last visit, reports having trouble with congestion and drainage, worse in the last month.  Using Xyzal  daily but forgets to take Flonase .  Does not think Atrovent  works well.  Relfux is doing well, no issues with heartburn, sour taste in mouth. Takes PPI daily.    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  B-cell, favor follicular NHL.  CD79a, CD20 and CD10 positive.  Cyclin D1 negative.    Fungal infection 08/01/2013   GERD (gastroesophageal reflux disease)    Hemorrhoids 08/05/2013   Hepatic steatosis 10/20/2015   History of B-cell lymphoma 09/25/2011   Core needle biopsy of left kidney mass--lower pole 09/30/11.  B-cell, favor follicular NHL.  CD79a, CD20 and CD10 positive.  Cyclin D1 negative.    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 Physical Exam: GEN: alert, well developed HEENT: clear conjunctiva, nose with mild inferior turbinate hypertrophy, pink nasal mucosa, clear rhinorrhea, + cobblestoning HEART: regular rate and rhythm, no murmur LUNGS: clear to auscultation bilaterally, no coughing, unlabored respiration SKIN: no rashes or lesions  Assessment:   1. Seasonal and perennial allergic rhinitis   2. Gastroesophageal reflux disease, unspecified whether esophagitis present     Plan/Recommendations:   Allergic Rhinitis: - Uncontrolled, discussed daily use of Flonase /Xyzal  with PRN Azelastine  - SPT 01/2024: weeds, trees, and indoor molds - Use nasal saline rinses before nose sprays such as with Neilmed Sinus Rinse.  Use distilled water.   - Use Flonase  2 sprays each nostril daily. Aim upward and outward. - Use Azelastine 2 sprays each nostril twice daily as needed for runny nose, drainage, sneezing, congestion. Aim upward and outward - Use Xyzal  5 mg daily.  - Not an allergy  shot candidate due to history of B cell lymphoma.   GERD: -  Controlled.  - Continue follow up with GI.  Continue Protonix  20mg  in the morning on an empty stomach.  -Avoid lying down for at least two hours after a meal or after drinking acidic beverages, like soda, or other caffeinated beverages. This can help to prevent stomach contents from flowing back into the esophagus. -Keep your head elevated while you sleep. Using an extra pillow or two can also help to prevent reflux. -Eat smaller and more frequent meals each day instead of a few large meals. This  promotes digestion and can aid in preventing heartburn. -Wear loose-fitting clothes to ease pressure on the stomach, which can worsen heartburn and reflux. -Reduce excess weight around the midsection. This can ease pressure on the stomach. Such pressure can force some stomach contents back up the esophagus.     Return in about 6 months (around 01/25/2025).  Arleta Blanch, MD [facility] 

## 2024-07-28 NOTE — Telephone Encounter (Signed)
 Pharmacy Patient Advocate Encounter   Received notification from Fax that prior authorization for Losartan   is required/requested.   Insurance verification completed.   The patient is insured through [employer].   Per test claim: PA required; PA submitted to above mentioned insurance via Latent Key/confirmation #/EOC [id number] Status is pending

## 2024-07-28 NOTE — Patient Instructions (Addendum)
 Allergic Rhinitis: - SPT 01/2024: weeds, trees, and indoor molds - Use nasal saline rinses before nose sprays such as with Neilmed Sinus Rinse.  Use distilled water.   - Use Flonase  2 sprays each nostril daily. Aim upward and outward. - Use Azelastine 2 sprays each nostril twice daily as needed for runny nose, drainage, sneezing, congestion. Aim upward and outward - Use Xyzal  5 mg daily.   GERD: - Continue follow up with GI.   - Continue Protonix  20mg  in the morning on an empty stomach.  -Avoid lying down for at least two hours after a meal or after drinking acidic beverages, like soda, or other caffeinated beverages. This can help to prevent stomach contents from flowing back into the esophagus. -Keep your head elevated while you sleep. Using an extra pillow or two can also help to prevent reflux. -Eat smaller and more frequent meals each day instead of a few large meals. This promotes digestion and can aid in preventing heartburn. -Wear loose-fitting clothes to ease pressure on the stomach, which can worsen heartburn and reflux. -Reduce excess weight around the midsection. This can ease pressure on the stomach. Such pressure can force some stomach contents back up the esophagus.

## 2024-07-28 NOTE — Telephone Encounter (Signed)
 Pharmacy Patient Advocate Encounter  Received notification from [employer] that Prior Authorization for losartan25 MG (3 tabs)   has been APPROVED from 09/12/23 to 09/10/25. Ran test claim, Copay is $9.63- 3 months . This test claim was processed through [facility]- copay amounts may vary at other pharmacies due to pharmacy/plan contracts, or as the patient moves through the different stages of their insurance plan.   PA #/Case ID/Reference #: [id number]

## 2024-08-11 DIAGNOSIS — I10 Essential (primary) hypertension: Secondary | ICD-10-CM | POA: Diagnosis not present

## 2024-08-12 DIAGNOSIS — M79674 Pain in right toe(s): Secondary | ICD-10-CM | POA: Diagnosis not present

## 2024-08-12 DIAGNOSIS — M79675 Pain in left toe(s): Secondary | ICD-10-CM | POA: Diagnosis not present

## 2024-08-12 DIAGNOSIS — B351 Tinea unguium: Secondary | ICD-10-CM | POA: Diagnosis not present

## 2024-08-12 LAB — BASIC METABOLIC PANEL WITH GFR
BUN/Creatinine Ratio: 17 (ref 12–28)
BUN: 16 mg/dL (ref 8–27)
CO2: 24 mmol/L (ref 20–29)
Calcium: 9.7 mg/dL (ref 8.7–10.3)
Chloride: 103 mmol/L (ref 96–106)
Creatinine, Ser: 0.93 mg/dL (ref 0.57–1.00)
Glucose: 83 mg/dL (ref 70–99)
Potassium: 4.7 mmol/L (ref 3.5–5.2)
Sodium: 141 mmol/L (ref 134–144)
eGFR: 63 mL/min/1.73 (ref >59)

## 2024-08-14 ENCOUNTER — Ambulatory Visit: Payer: Self-pay | Admitting: Cardiology

## 2024-09-15 ENCOUNTER — Other Ambulatory Visit: Payer: Self-pay | Admitting: Family Medicine

## 2024-09-15 DIAGNOSIS — Z1231 Encounter for screening mammogram for malignant neoplasm of breast: Secondary | ICD-10-CM

## 2024-09-16 ENCOUNTER — Encounter: Payer: Self-pay | Admitting: Gastroenterology

## 2024-10-16 ENCOUNTER — Ambulatory Visit

## 2024-10-16 ENCOUNTER — Inpatient Hospital Stay: Admission: RE | Admit: 2024-10-16 | Discharge: 2024-10-16 | Attending: Family Medicine | Admitting: Family Medicine

## 2024-10-16 DIAGNOSIS — Z1231 Encounter for screening mammogram for malignant neoplasm of breast: Secondary | ICD-10-CM

## 2024-11-03 ENCOUNTER — Ambulatory Visit: Admitting: Gastroenterology

## 2025-01-26 ENCOUNTER — Ambulatory Visit: Admitting: Internal Medicine

## 2025-07-10 ENCOUNTER — Inpatient Hospital Stay: Admitting: Hematology and Oncology

## 2025-07-10 ENCOUNTER — Inpatient Hospital Stay
